# Patient Record
Sex: Male | Born: 1960
Health system: Southern US, Community
[De-identification: ages and names within clinical notes are randomized; demographics above are authoritative.]

## PROBLEM LIST (undated history)

## (undated) DIAGNOSIS — I69354 Hemiplegia and hemiparesis following cerebral infarction affecting left non-dominant side: Secondary | ICD-10-CM

## (undated) DIAGNOSIS — G936 Cerebral edema: Secondary | ICD-10-CM

## (undated) DIAGNOSIS — I69991 Dysphagia following unspecified cerebrovascular disease: Secondary | ICD-10-CM

## (undated) DIAGNOSIS — I639 Cerebral infarction, unspecified: Secondary | ICD-10-CM

## (undated) DIAGNOSIS — W19XXXA Unspecified fall, initial encounter: Secondary | ICD-10-CM

## (undated) DIAGNOSIS — E119 Type 2 diabetes mellitus without complications: Secondary | ICD-10-CM

## (undated) DIAGNOSIS — I10 Essential (primary) hypertension: Secondary | ICD-10-CM

## (undated) DIAGNOSIS — Y92009 Unspecified place in unspecified non-institutional (private) residence as the place of occurrence of the external cause: Secondary | ICD-10-CM

## (undated) DIAGNOSIS — A159 Respiratory tuberculosis unspecified: Secondary | ICD-10-CM

## (undated) DIAGNOSIS — J9600 Acute respiratory failure, unspecified whether with hypoxia or hypercapnia: Secondary | ICD-10-CM

## (undated) DIAGNOSIS — E785 Hyperlipidemia, unspecified: Secondary | ICD-10-CM

## (undated) HISTORY — PX: SKIN GRAFT: SHX250

## (undated) HISTORY — DX: Cerebral edema: G93.6

## (undated) HISTORY — DX: Unspecified place in unspecified non-institutional (private) residence as the place of occurrence of the external cause: W19.XXXA

## (undated) HISTORY — PX: UPPER GASTROINTESTINAL ENDOSCOPY: SHX188

## (undated) HISTORY — DX: Respiratory tuberculosis unspecified: A15.9

## (undated) HISTORY — DX: Type 2 diabetes mellitus without complications: E11.9

## (undated) HISTORY — DX: Hemiplegia and hemiparesis following cerebral infarction affecting left non-dominant side: I69.354

## (undated) HISTORY — DX: Acute respiratory failure, unspecified whether with hypoxia or hypercapnia: J96.00

## (undated) HISTORY — DX: Dysphagia following unspecified cerebrovascular disease: I69.991

## (undated) HISTORY — DX: Cerebral infarction, unspecified: I63.9

## (undated) HISTORY — DX: Essential (primary) hypertension: I10

## (undated) HISTORY — PX: OTHER SURGICAL HISTORY: SHX169

## (undated) HISTORY — DX: Unspecified place in unspecified non-institutional (private) residence as the place of occurrence of the external cause: Y92.009

## (undated) HISTORY — DX: Hyperlipidemia, unspecified: E78.5

---

## 2014-11-08 DIAGNOSIS — I639 Cerebral infarction, unspecified: Secondary | ICD-10-CM

## 2014-11-08 HISTORY — DX: Cerebral infarction, unspecified: I63.9

## 2015-05-08 DIAGNOSIS — R1312 Dysphagia, oropharyngeal phase: Secondary | ICD-10-CM | POA: Diagnosis present

## 2015-05-08 DIAGNOSIS — I668 Occlusion and stenosis of other cerebral arteries: Secondary | ICD-10-CM | POA: Diagnosis not present

## 2015-05-08 DIAGNOSIS — E781 Pure hyperglyceridemia: Secondary | ICD-10-CM | POA: Diagnosis present

## 2015-05-08 DIAGNOSIS — F17210 Nicotine dependence, cigarettes, uncomplicated: Secondary | ICD-10-CM | POA: Diagnosis present

## 2015-05-08 DIAGNOSIS — I69154 Hemiplegia and hemiparesis following nontraumatic intracerebral hemorrhage affecting left non-dominant side: Secondary | ICD-10-CM | POA: Diagnosis not present

## 2015-05-08 DIAGNOSIS — I340 Nonrheumatic mitral (valve) insufficiency: Secondary | ICD-10-CM | POA: Diagnosis not present

## 2015-05-08 DIAGNOSIS — R471 Dysarthria and anarthria: Secondary | ICD-10-CM | POA: Diagnosis present

## 2015-05-08 DIAGNOSIS — J690 Pneumonitis due to inhalation of food and vomit: Secondary | ICD-10-CM | POA: Diagnosis not present

## 2015-05-08 DIAGNOSIS — R5383 Other fatigue: Secondary | ICD-10-CM | POA: Diagnosis not present

## 2015-05-08 DIAGNOSIS — I639 Cerebral infarction, unspecified: Secondary | ICD-10-CM | POA: Diagnosis not present

## 2015-05-08 DIAGNOSIS — R279 Unspecified lack of coordination: Secondary | ICD-10-CM | POA: Diagnosis not present

## 2015-05-08 DIAGNOSIS — R9431 Abnormal electrocardiogram [ECG] [EKG]: Secondary | ICD-10-CM | POA: Insufficient documentation

## 2015-05-08 DIAGNOSIS — R414 Neurologic neglect syndrome: Secondary | ICD-10-CM | POA: Diagnosis present

## 2015-05-08 DIAGNOSIS — I10 Essential (primary) hypertension: Secondary | ICD-10-CM | POA: Diagnosis not present

## 2015-05-08 DIAGNOSIS — G8194 Hemiplegia, unspecified affecting left nondominant side: Secondary | ICD-10-CM | POA: Diagnosis not present

## 2015-05-08 DIAGNOSIS — E782 Mixed hyperlipidemia: Secondary | ICD-10-CM | POA: Diagnosis not present

## 2015-05-08 DIAGNOSIS — R633 Feeding difficulties: Secondary | ICD-10-CM | POA: Diagnosis not present

## 2015-05-08 DIAGNOSIS — R4189 Other symptoms and signs involving cognitive functions and awareness: Secondary | ICD-10-CM | POA: Diagnosis present

## 2015-05-08 DIAGNOSIS — G936 Cerebral edema: Secondary | ICD-10-CM | POA: Diagnosis present

## 2015-05-08 DIAGNOSIS — J811 Chronic pulmonary edema: Secondary | ICD-10-CM | POA: Diagnosis not present

## 2015-05-08 DIAGNOSIS — I63411 Cerebral infarction due to embolism of right middle cerebral artery: Secondary | ICD-10-CM | POA: Diagnosis present

## 2015-05-08 DIAGNOSIS — N179 Acute kidney failure, unspecified: Secondary | ICD-10-CM | POA: Diagnosis not present

## 2015-05-08 DIAGNOSIS — H53462 Homonymous bilateral field defects, left side: Secondary | ICD-10-CM | POA: Diagnosis present

## 2015-05-08 DIAGNOSIS — I361 Nonrheumatic tricuspid (valve) insufficiency: Secondary | ICD-10-CM | POA: Diagnosis not present

## 2015-05-08 DIAGNOSIS — J9811 Atelectasis: Secondary | ICD-10-CM | POA: Diagnosis not present

## 2015-05-08 DIAGNOSIS — R29810 Facial weakness: Secondary | ICD-10-CM | POA: Diagnosis not present

## 2015-05-08 DIAGNOSIS — F17290 Nicotine dependence, other tobacco product, uncomplicated: Secondary | ICD-10-CM | POA: Diagnosis not present

## 2015-05-08 DIAGNOSIS — D696 Thrombocytopenia, unspecified: Secondary | ICD-10-CM | POA: Diagnosis present

## 2015-05-08 DIAGNOSIS — I63311 Cerebral infarction due to thrombosis of right middle cerebral artery: Secondary | ICD-10-CM | POA: Diagnosis not present

## 2015-05-08 DIAGNOSIS — J9600 Acute respiratory failure, unspecified whether with hypoxia or hypercapnia: Secondary | ICD-10-CM | POA: Diagnosis not present

## 2015-05-08 DIAGNOSIS — Z9889 Other specified postprocedural states: Secondary | ICD-10-CM | POA: Diagnosis not present

## 2015-05-08 DIAGNOSIS — R4781 Slurred speech: Secondary | ICD-10-CM | POA: Diagnosis present

## 2015-05-08 DIAGNOSIS — G510 Bell's palsy: Secondary | ICD-10-CM | POA: Diagnosis present

## 2015-05-08 DIAGNOSIS — J168 Pneumonia due to other specified infectious organisms: Secondary | ICD-10-CM | POA: Diagnosis not present

## 2015-05-08 DIAGNOSIS — E780 Pure hypercholesterolemia: Secondary | ICD-10-CM | POA: Diagnosis present

## 2015-05-08 DIAGNOSIS — I629 Nontraumatic intracranial hemorrhage, unspecified: Secondary | ICD-10-CM | POA: Diagnosis not present

## 2015-05-08 DIAGNOSIS — I63511 Cerebral infarction due to unspecified occlusion or stenosis of right middle cerebral artery: Secondary | ICD-10-CM | POA: Diagnosis not present

## 2015-05-08 DIAGNOSIS — J90 Pleural effusion, not elsewhere classified: Secondary | ICD-10-CM | POA: Diagnosis not present

## 2015-05-08 DIAGNOSIS — M7989 Other specified soft tissue disorders: Secondary | ICD-10-CM | POA: Diagnosis present

## 2015-05-08 DIAGNOSIS — S066X9A Traumatic subarachnoid hemorrhage with loss of consciousness of unspecified duration, initial encounter: Secondary | ICD-10-CM | POA: Diagnosis not present

## 2015-05-08 DIAGNOSIS — F339 Major depressive disorder, recurrent, unspecified: Secondary | ICD-10-CM | POA: Diagnosis not present

## 2015-05-08 DIAGNOSIS — J9601 Acute respiratory failure with hypoxia: Secondary | ICD-10-CM | POA: Diagnosis not present

## 2015-05-08 DIAGNOSIS — M6281 Muscle weakness (generalized): Secondary | ICD-10-CM | POA: Diagnosis not present

## 2015-05-08 DIAGNOSIS — E871 Hypo-osmolality and hyponatremia: Secondary | ICD-10-CM | POA: Diagnosis not present

## 2015-05-08 DIAGNOSIS — I6601 Occlusion and stenosis of right middle cerebral artery: Secondary | ICD-10-CM | POA: Diagnosis not present

## 2015-05-08 DIAGNOSIS — R319 Hematuria, unspecified: Secondary | ICD-10-CM | POA: Diagnosis not present

## 2015-05-08 DIAGNOSIS — I6339 Cerebral infarction due to thrombosis of other cerebral artery: Secondary | ICD-10-CM | POA: Diagnosis not present

## 2015-05-08 DIAGNOSIS — I4581 Long QT syndrome: Secondary | ICD-10-CM | POA: Diagnosis present

## 2015-05-08 DIAGNOSIS — E785 Hyperlipidemia, unspecified: Secondary | ICD-10-CM | POA: Diagnosis present

## 2015-05-08 DIAGNOSIS — Z4682 Encounter for fitting and adjustment of non-vascular catheter: Secondary | ICD-10-CM | POA: Diagnosis not present

## 2015-05-08 DIAGNOSIS — Z8673 Personal history of transient ischemic attack (TIA), and cerebral infarction without residual deficits: Secondary | ICD-10-CM | POA: Insufficient documentation

## 2015-05-08 DIAGNOSIS — R1310 Dysphagia, unspecified: Secondary | ICD-10-CM | POA: Diagnosis not present

## 2015-05-08 DIAGNOSIS — I63319 Cerebral infarction due to thrombosis of unspecified middle cerebral artery: Secondary | ICD-10-CM | POA: Diagnosis not present

## 2015-05-08 DIAGNOSIS — E119 Type 2 diabetes mellitus without complications: Secondary | ICD-10-CM | POA: Diagnosis present

## 2015-05-08 DIAGNOSIS — G8104 Flaccid hemiplegia affecting left nondominant side: Secondary | ICD-10-CM | POA: Diagnosis present

## 2015-05-14 DIAGNOSIS — E782 Mixed hyperlipidemia: Secondary | ICD-10-CM | POA: Insufficient documentation

## 2015-05-19 HISTORY — PX: GASTROSTOMY TUBE PLACEMENT: SHX655

## 2015-05-30 DIAGNOSIS — J690 Pneumonitis due to inhalation of food and vomit: Secondary | ICD-10-CM | POA: Diagnosis not present

## 2015-05-30 DIAGNOSIS — M6281 Muscle weakness (generalized): Secondary | ICD-10-CM | POA: Diagnosis not present

## 2015-05-30 DIAGNOSIS — J90 Pleural effusion, not elsewhere classified: Secondary | ICD-10-CM | POA: Diagnosis present

## 2015-05-30 DIAGNOSIS — E782 Mixed hyperlipidemia: Secondary | ICD-10-CM | POA: Diagnosis not present

## 2015-05-30 DIAGNOSIS — I63411 Cerebral infarction due to embolism of right middle cerebral artery: Secondary | ICD-10-CM | POA: Diagnosis not present

## 2015-05-30 DIAGNOSIS — R1310 Dysphagia, unspecified: Secondary | ICD-10-CM | POA: Diagnosis not present

## 2015-05-30 DIAGNOSIS — G8194 Hemiplegia, unspecified affecting left nondominant side: Secondary | ICD-10-CM | POA: Diagnosis present

## 2015-05-30 DIAGNOSIS — I4581 Long QT syndrome: Secondary | ICD-10-CM | POA: Diagnosis not present

## 2015-05-30 DIAGNOSIS — R1011 Right upper quadrant pain: Secondary | ICD-10-CM | POA: Diagnosis not present

## 2015-05-30 DIAGNOSIS — Z720 Tobacco use: Secondary | ICD-10-CM | POA: Diagnosis not present

## 2015-05-30 DIAGNOSIS — I69354 Hemiplegia and hemiparesis following cerebral infarction affecting left non-dominant side: Secondary | ICD-10-CM | POA: Diagnosis not present

## 2015-05-30 DIAGNOSIS — Z789 Other specified health status: Secondary | ICD-10-CM | POA: Diagnosis not present

## 2015-05-30 DIAGNOSIS — I69398 Other sequelae of cerebral infarction: Secondary | ICD-10-CM | POA: Diagnosis not present

## 2015-05-30 DIAGNOSIS — R52 Pain, unspecified: Secondary | ICD-10-CM | POA: Diagnosis not present

## 2015-05-30 DIAGNOSIS — R05 Cough: Secondary | ICD-10-CM | POA: Diagnosis not present

## 2015-05-30 DIAGNOSIS — I639 Cerebral infarction, unspecified: Secondary | ICD-10-CM | POA: Diagnosis not present

## 2015-05-30 DIAGNOSIS — R1012 Left upper quadrant pain: Secondary | ICD-10-CM | POA: Diagnosis not present

## 2015-05-30 DIAGNOSIS — R0781 Pleurodynia: Secondary | ICD-10-CM | POA: Diagnosis present

## 2015-05-30 DIAGNOSIS — I69959 Hemiplegia and hemiparesis following unspecified cerebrovascular disease affecting unspecified side: Secondary | ICD-10-CM | POA: Diagnosis not present

## 2015-05-30 DIAGNOSIS — R279 Unspecified lack of coordination: Secondary | ICD-10-CM | POA: Diagnosis not present

## 2015-05-30 DIAGNOSIS — T17908A Unspecified foreign body in respiratory tract, part unspecified causing other injury, initial encounter: Secondary | ICD-10-CM | POA: Diagnosis not present

## 2015-05-30 DIAGNOSIS — F17290 Nicotine dependence, other tobacco product, uncomplicated: Secondary | ICD-10-CM | POA: Diagnosis not present

## 2015-05-30 DIAGNOSIS — I69154 Hemiplegia and hemiparesis following nontraumatic intracerebral hemorrhage affecting left non-dominant side: Secondary | ICD-10-CM | POA: Diagnosis not present

## 2015-05-30 DIAGNOSIS — E785 Hyperlipidemia, unspecified: Secondary | ICD-10-CM | POA: Diagnosis not present

## 2015-05-30 DIAGNOSIS — R918 Other nonspecific abnormal finding of lung field: Secondary | ICD-10-CM | POA: Diagnosis not present

## 2015-05-30 DIAGNOSIS — I10 Essential (primary) hypertension: Secondary | ICD-10-CM | POA: Diagnosis not present

## 2015-05-30 DIAGNOSIS — I6339 Cerebral infarction due to thrombosis of other cerebral artery: Secondary | ICD-10-CM | POA: Diagnosis not present

## 2015-05-30 DIAGNOSIS — F339 Major depressive disorder, recurrent, unspecified: Secondary | ICD-10-CM | POA: Diagnosis not present

## 2015-05-30 DIAGNOSIS — R079 Chest pain, unspecified: Secondary | ICD-10-CM | POA: Diagnosis not present

## 2015-05-30 DIAGNOSIS — E1140 Type 2 diabetes mellitus with diabetic neuropathy, unspecified: Secondary | ICD-10-CM | POA: Diagnosis not present

## 2015-05-30 DIAGNOSIS — R0602 Shortness of breath: Secondary | ICD-10-CM | POA: Diagnosis not present

## 2015-05-30 DIAGNOSIS — Z87891 Personal history of nicotine dependence: Secondary | ICD-10-CM | POA: Diagnosis not present

## 2015-05-30 DIAGNOSIS — Z931 Gastrostomy status: Secondary | ICD-10-CM | POA: Diagnosis not present

## 2015-05-30 DIAGNOSIS — I69321 Dysphasia following cerebral infarction: Secondary | ICD-10-CM | POA: Diagnosis not present

## 2015-05-30 DIAGNOSIS — R110 Nausea: Secondary | ICD-10-CM | POA: Diagnosis not present

## 2015-05-30 DIAGNOSIS — I69392 Facial weakness following cerebral infarction: Secondary | ICD-10-CM | POA: Diagnosis not present

## 2015-05-30 DIAGNOSIS — D649 Anemia, unspecified: Secondary | ICD-10-CM | POA: Diagnosis present

## 2015-05-30 DIAGNOSIS — I6350 Cerebral infarction due to unspecified occlusion or stenosis of unspecified cerebral artery: Secondary | ICD-10-CM | POA: Diagnosis not present

## 2015-05-30 DIAGNOSIS — R319 Hematuria, unspecified: Secondary | ICD-10-CM | POA: Diagnosis present

## 2015-05-30 DIAGNOSIS — J189 Pneumonia, unspecified organism: Secondary | ICD-10-CM | POA: Diagnosis not present

## 2015-05-30 DIAGNOSIS — H539 Unspecified visual disturbance: Secondary | ICD-10-CM | POA: Diagnosis present

## 2015-05-30 DIAGNOSIS — N179 Acute kidney failure, unspecified: Secondary | ICD-10-CM | POA: Diagnosis not present

## 2015-05-30 DIAGNOSIS — F329 Major depressive disorder, single episode, unspecified: Secondary | ICD-10-CM | POA: Diagnosis not present

## 2015-05-30 DIAGNOSIS — E119 Type 2 diabetes mellitus without complications: Secondary | ICD-10-CM | POA: Diagnosis not present

## 2015-05-30 DIAGNOSIS — R109 Unspecified abdominal pain: Secondary | ICD-10-CM | POA: Diagnosis not present

## 2015-05-30 DIAGNOSIS — I69391 Dysphagia following cerebral infarction: Secondary | ICD-10-CM | POA: Diagnosis not present

## 2015-05-30 DIAGNOSIS — K5900 Constipation, unspecified: Secondary | ICD-10-CM | POA: Diagnosis not present

## 2015-05-30 DIAGNOSIS — I69854 Hemiplegia and hemiparesis following other cerebrovascular disease affecting left non-dominant side: Secondary | ICD-10-CM | POA: Diagnosis not present

## 2015-05-30 DIAGNOSIS — J168 Pneumonia due to other specified infectious organisms: Secondary | ICD-10-CM | POA: Diagnosis not present

## 2015-05-30 DIAGNOSIS — Z8673 Personal history of transient ischemic attack (TIA), and cerebral infarction without residual deficits: Secondary | ICD-10-CM | POA: Diagnosis not present

## 2015-06-02 ENCOUNTER — Encounter: Payer: Self-pay | Admitting: Adult Health

## 2015-06-02 ENCOUNTER — Non-Acute Institutional Stay (SKILLED_NURSING_FACILITY): Payer: Medicare Other | Admitting: Adult Health

## 2015-06-02 DIAGNOSIS — I69354 Hemiplegia and hemiparesis following cerebral infarction affecting left non-dominant side: Secondary | ICD-10-CM | POA: Insufficient documentation

## 2015-06-02 DIAGNOSIS — K5909 Other constipation: Secondary | ICD-10-CM | POA: Insufficient documentation

## 2015-06-02 DIAGNOSIS — I69854 Hemiplegia and hemiparesis following other cerebrovascular disease affecting left non-dominant side: Secondary | ICD-10-CM

## 2015-06-02 DIAGNOSIS — F329 Major depressive disorder, single episode, unspecified: Secondary | ICD-10-CM | POA: Diagnosis not present

## 2015-06-02 DIAGNOSIS — E785 Hyperlipidemia, unspecified: Secondary | ICD-10-CM | POA: Diagnosis not present

## 2015-06-02 DIAGNOSIS — I6350 Cerebral infarction due to unspecified occlusion or stenosis of unspecified cerebral artery: Secondary | ICD-10-CM

## 2015-06-02 DIAGNOSIS — I10 Essential (primary) hypertension: Secondary | ICD-10-CM | POA: Insufficient documentation

## 2015-06-02 DIAGNOSIS — K5900 Constipation, unspecified: Secondary | ICD-10-CM | POA: Diagnosis not present

## 2015-06-02 DIAGNOSIS — E1149 Type 2 diabetes mellitus with other diabetic neurological complication: Secondary | ICD-10-CM | POA: Insufficient documentation

## 2015-06-02 DIAGNOSIS — I69391 Dysphagia following cerebral infarction: Secondary | ICD-10-CM | POA: Insufficient documentation

## 2015-06-02 DIAGNOSIS — E1140 Type 2 diabetes mellitus with diabetic neuropathy, unspecified: Secondary | ICD-10-CM

## 2015-06-02 DIAGNOSIS — I639 Cerebral infarction, unspecified: Secondary | ICD-10-CM

## 2015-06-02 DIAGNOSIS — IMO0002 Reserved for concepts with insufficient information to code with codable children: Secondary | ICD-10-CM | POA: Insufficient documentation

## 2015-06-02 NOTE — Progress Notes (Signed)
Patient ID: [name] [name], male   DOB: 1961/10/27, 54 y.o.   MRN: [id number]    Facility: [facility]      No Known Allergies  Chief Complaint  Patient presents with  . Hospitalization Follow-up    HPI:  He has been hospitalized for his second cva. He has left hemiparesis with very gross movement present on the left side. He is peg tube dependent at this time for his nutritional support. He is here for short term rehab with his goal to return back home. He is not voicing any concerns at this time.    Past Medical History  Diagnosis Date  . Stroke   . Hypertension   . Hyperlipidemia   . Diabetes mellitus without complication   . Fall at home   . Hemiparesis affecting left side as late effect of stroke   . Dysphagia as late effect of cerebrovascular disease   . Acute respiratory failure   . Cerebral edema     Past Surgical History  Procedure Laterality Date  . Gastrostomy tube placement  05-19-15  . Mechanical thrombectomy angioplasty stenet placement      VITAL SIGNS BP 142/79 mmHg  Pulse 86  Ht 6' (1.829 m)  Wt 259 lb (117.482 kg)  BMI 35.12 kg/m2  Patient's Medications  New Prescriptions   No medications on file  Previous Medications   ACETAMINOPHEN (TYLENOL) 325 MG TABLET    Take 325-650 mg by mouth every 6 (six) hours as needed.   AMANTADINE (SYMMETREL) 100 MG CAPSULE    Take 200 mg by mouth 2 (two) times daily.   AMLODIPINE (NORVASC) 10 MG TABLET    Take 10 mg by mouth daily.   ASPIRIN 81 MG TABLET    Take 81 mg by mouth daily.   ATORVASTATIN (LIPITOR) 80 MG TABLET    Take 80 mg by mouth daily at 6 PM.   CLOPIDOGREL (PLAVIX) 75 MG TABLET    Take 75 mg by mouth daily.   FLUOXETINE (PROZAC) 20 MG CAPSULE    Take 20 mg by mouth daily.   HYDROCHLOROTHIAZIDE (HYDRODIURIL) 25 MG TABLET    Take 25 mg by mouth daily.   LISINOPRIL (PRINIVIL,ZESTRIL) 40 MG TABLET    Take 40 mg by mouth daily.   MINOXIDIL (LONITEN) 2.5 MG TABLET    Take 5 mg by mouth  daily.   MULTIPLE VITAMINS TABLET    Take 1 tablet by mouth daily.   SENNOSIDES-DOCUSATE SODIUM (SENOKOT-S) 8.6-50 MG TABLET    Take 2 tablets by mouth 2 (two) times daily.  Modified Medications   No medications on file  Discontinued Medications   No medications on file     SIGNIFICANT DIAGNOSTIC EXAMS  04-28-15: ct of head: 1. Right medial parietal hematoma, stable. 2. 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 No new hemorrhage   6-300-16: ct of head: 1. Findings consistent with evolution of patchy regions of right MCA infarction, status post thrombectomy and stenting of M1. 2. Negative for hemorrhage  05-08-15: MRI of brain: 1. Findings consistent with large region of acute ischemia int he right MCA distrubution. On evidence of hemorrhage. 2. Interval progression of chronic small vessel ischemic changes.   05-08-15: 2-d echo: mild LV dysfunction EF 50%; with moderate LVH; normal LA pressures with diastolic dysfunction. Normal right ventricular systolic function. Valvular regurgitation: trivial MR; trivial TR; no valvular stenosis.    05-09-15: EKG: sinus bradycardia   05-09-15: chest x-ry: 1. Stable enlarged  cardiac and mediastinal contours. 2. Small left pleural effusion with underlying atelectasis   05-09-15: diagnostic cerebral angiogram: 1. Right common femoral angiogram demonstrates satisfactory placement of sheath within the right common femoral artery, above the common femoral bifurcation and below  the origin of the inferior epigraastric artery with adequate vascular caliber for use of an arterial closure device. Right common carotid angiography demonstrates stable positioning and configuration of right mca enterprise stent device with preserved stent patency an antegrade opacification of distal right mca territory. Stable positioning and configuration of trigt MCA M1 segment enterprise stent device with preserved stent patency and antegrade opacification of right MCA  territory   05-11-15: left upper extremity venous ultrasound: negative for dvt  05-28-15: ct of head; 1. Right medial parietal hematoma , stable. 2. findings of right MCA territory laminar necrosis unchanged. No new hemorrhage   LABS REVIEWED:   05-08-15: hgb a1c 5.9; chol 253; trig 424; hdl 47  05-26-15: glucose 126; bun 20; creat 1.0; k+4.0; na++ 134 05-28-15: wbc 5.4; hgb 12.0; hct 34.6; mcv 86; plt 233; glucose 106; bun 26; creat 1.6; k+3.8; na++ 138 05-29-15: wbc 5.7; hgb 12.6; hct 36.6; mcv 86; plt 215;glcuose 156; bun 18; creat 1.1; k+3.8; na++ 135        Review of Systems  Constitutional: Negative for appetite change and fatigue.  HENT: Negative for congestion.   Respiratory: Negative for cough, chest tightness and shortness of breath.   Cardiovascular: Negative for chest pain, palpitations and leg swelling.  Gastrointestinal: Negative for nausea, abdominal pain, diarrhea and constipation.  Musculoskeletal: Negative for myalgias and arthralgias.       He has left side weakness present   Skin: Negative for pallor.  Neurological: Negative for dizziness.  Psychiatric/Behavioral: The patient is not nervous/anxious.       Physical Exam  Vitals reviewed. Constitutional: No distress.  Obese   Eyes: Conjunctivae are normal.  Neck: Neck supple. No JVD present. No thyromegaly present.  Cardiovascular: Normal rate, regular rhythm and intact distal pulses.   Respiratory: Effort normal and breath sounds normal. No respiratory distress. He has no wheezes.  GI: Soft. Bowel sounds are normal. He exhibits no distension. There is no tenderness.  Has peg tube present   Musculoskeletal: He exhibits no edema.  Has left hemiparesis with gross movement present  Is able to move right side extremities.    Lymphadenopathy:    He has no cervical adenopathy.  Neurological: He is alert.  Skin: Skin is warm and dry. He is not diaphoretic.  Psychiatric: He has a normal mood and affect.        ASSESSMENT/ PLAN:  1.  Acute cva with left hemiparesis: is neurologically stable; will continue therapy as directed to improve upon mobility strength and independence with adl's will continue asa 81 mg daily; and plavix 75 mg daily will continue amantadine 200 mg twice daily for abnormal movements and will monitor his status.   2. Hypertension: will continue norvasc 10 mg daily; hctz 25 mg daily; lisinopril 40 mg daily minoxidil 5 mg daily and will have nursing check blood pressure three times daily   3. Dyslipidemia: will continue lipitor 80 mg daily and will begin fenofibrate 40 mg daily trig 424 and total cholesterol 253  4. Diabetes with neurological effects: his hgb a1c is 5.9; diet controlled; will have nursing check blood pressure twice daily and will monitor  5. Constipation: will continue senna s 2 tabs twice daily   6. Depression: will continue prozac  20 mg daily will monitor his status.   7. Dysphagia: no signs of aspiration present; is peg tube dependent at this time for his nutritional status. Does receive a tray at this time with  Tube feeding at night and bolus for low meals intake. Will continue speech therapy as directed and will monitor his status.     Time spent with patient  60  minutes >50% time spent counseling; reviewing medical record; tests; labs; and developing future plan of care   Ok Edwards NP [facility]  Contact [phone] Monday through Friday 8am- 5pm  After hours call [phone]

## 2015-06-03 ENCOUNTER — Non-Acute Institutional Stay (SKILLED_NURSING_FACILITY): Payer: Medicare Other | Admitting: Internal Medicine

## 2015-06-03 DIAGNOSIS — F329 Major depressive disorder, single episode, unspecified: Secondary | ICD-10-CM | POA: Diagnosis not present

## 2015-06-03 DIAGNOSIS — Z720 Tobacco use: Secondary | ICD-10-CM

## 2015-06-03 DIAGNOSIS — E1149 Type 2 diabetes mellitus with other diabetic neurological complication: Secondary | ICD-10-CM

## 2015-06-03 DIAGNOSIS — I69391 Dysphagia following cerebral infarction: Secondary | ICD-10-CM

## 2015-06-03 DIAGNOSIS — I4581 Long QT syndrome: Secondary | ICD-10-CM | POA: Diagnosis not present

## 2015-06-03 DIAGNOSIS — I69959 Hemiplegia and hemiparesis following unspecified cerebrovascular disease affecting unspecified side: Secondary | ICD-10-CM | POA: Diagnosis not present

## 2015-06-03 DIAGNOSIS — I10 Essential (primary) hypertension: Secondary | ICD-10-CM | POA: Diagnosis not present

## 2015-06-03 DIAGNOSIS — I6350 Cerebral infarction due to unspecified occlusion or stenosis of unspecified cerebral artery: Secondary | ICD-10-CM | POA: Diagnosis not present

## 2015-06-03 DIAGNOSIS — E785 Hyperlipidemia, unspecified: Secondary | ICD-10-CM | POA: Diagnosis not present

## 2015-06-03 DIAGNOSIS — Z931 Gastrostomy status: Secondary | ICD-10-CM

## 2015-06-03 DIAGNOSIS — R9431 Abnormal electrocardiogram [ECG] [EKG]: Secondary | ICD-10-CM

## 2015-06-03 DIAGNOSIS — E1140 Type 2 diabetes mellitus with diabetic neuropathy, unspecified: Secondary | ICD-10-CM | POA: Diagnosis not present

## 2015-06-03 DIAGNOSIS — I69359 Hemiplegia and hemiparesis following cerebral infarction affecting unspecified side: Secondary | ICD-10-CM

## 2015-06-03 DIAGNOSIS — IMO0002 Reserved for concepts with insufficient information to code with codable children: Secondary | ICD-10-CM

## 2015-07-14 ENCOUNTER — Non-Acute Institutional Stay (SKILLED_NURSING_FACILITY): Payer: Medicare Other | Admitting: Adult Health

## 2015-07-14 DIAGNOSIS — F329 Major depressive disorder, single episode, unspecified: Secondary | ICD-10-CM | POA: Diagnosis not present

## 2015-07-14 DIAGNOSIS — E1140 Type 2 diabetes mellitus with diabetic neuropathy, unspecified: Secondary | ICD-10-CM | POA: Diagnosis not present

## 2015-07-14 DIAGNOSIS — K5900 Constipation, unspecified: Secondary | ICD-10-CM | POA: Diagnosis not present

## 2015-07-14 DIAGNOSIS — I6350 Cerebral infarction due to unspecified occlusion or stenosis of unspecified cerebral artery: Secondary | ICD-10-CM | POA: Diagnosis not present

## 2015-07-14 DIAGNOSIS — I639 Cerebral infarction, unspecified: Secondary | ICD-10-CM | POA: Diagnosis not present

## 2015-07-14 DIAGNOSIS — I10 Essential (primary) hypertension: Secondary | ICD-10-CM | POA: Diagnosis not present

## 2015-07-14 DIAGNOSIS — IMO0002 Reserved for concepts with insufficient information to code with codable children: Secondary | ICD-10-CM

## 2015-07-14 DIAGNOSIS — I69391 Dysphagia following cerebral infarction: Secondary | ICD-10-CM

## 2015-07-14 DIAGNOSIS — E1149 Type 2 diabetes mellitus with other diabetic neurological complication: Secondary | ICD-10-CM

## 2015-07-14 DIAGNOSIS — I69854 Hemiplegia and hemiparesis following other cerebrovascular disease affecting left non-dominant side: Secondary | ICD-10-CM

## 2015-07-14 DIAGNOSIS — E785 Hyperlipidemia, unspecified: Secondary | ICD-10-CM | POA: Diagnosis not present

## 2015-07-14 DIAGNOSIS — K5909 Other constipation: Secondary | ICD-10-CM

## 2015-07-14 DIAGNOSIS — I69354 Hemiplegia and hemiparesis following cerebral infarction affecting left non-dominant side: Secondary | ICD-10-CM

## 2015-07-23 ENCOUNTER — Other Ambulatory Visit: Payer: Self-pay | Admitting: *Deleted

## 2015-07-23 ENCOUNTER — Emergency Department (HOSPITAL_COMMUNITY): Payer: Self-pay

## 2015-07-23 ENCOUNTER — Emergency Department (HOSPITAL_COMMUNITY)
Admission: EM | Admit: 2015-07-23 | Discharge: 2015-07-24 | Disposition: A | Discharge status: Home / Self Care | Payer: Self-pay | Attending: Emergency Medicine | Admitting: Emergency Medicine

## 2015-07-23 ENCOUNTER — Encounter (HOSPITAL_COMMUNITY): Payer: Self-pay | Admitting: Emergency Medicine

## 2015-07-23 ENCOUNTER — Other Ambulatory Visit (HOSPITAL_COMMUNITY): Payer: Self-pay | Admitting: Internal Medicine

## 2015-07-23 DIAGNOSIS — E785 Hyperlipidemia, unspecified: Secondary | ICD-10-CM | POA: Insufficient documentation

## 2015-07-23 DIAGNOSIS — Z720 Tobacco use: Secondary | ICD-10-CM | POA: Insufficient documentation

## 2015-07-23 DIAGNOSIS — Z8673 Personal history of transient ischemic attack (TIA), and cerebral infarction without residual deficits: Secondary | ICD-10-CM | POA: Insufficient documentation

## 2015-07-23 DIAGNOSIS — Z7902 Long term (current) use of antithrombotics/antiplatelets: Secondary | ICD-10-CM | POA: Insufficient documentation

## 2015-07-23 DIAGNOSIS — I10 Essential (primary) hypertension: Secondary | ICD-10-CM | POA: Insufficient documentation

## 2015-07-23 DIAGNOSIS — J181 Lobar pneumonia, unspecified organism: Secondary | ICD-10-CM

## 2015-07-23 DIAGNOSIS — J690 Pneumonitis due to inhalation of food and vomit: Secondary | ICD-10-CM

## 2015-07-23 DIAGNOSIS — J189 Pneumonia, unspecified organism: Secondary | ICD-10-CM

## 2015-07-23 DIAGNOSIS — Z79899 Other long term (current) drug therapy: Secondary | ICD-10-CM | POA: Insufficient documentation

## 2015-07-23 DIAGNOSIS — E119 Type 2 diabetes mellitus without complications: Secondary | ICD-10-CM | POA: Insufficient documentation

## 2015-07-23 DIAGNOSIS — J159 Unspecified bacterial pneumonia: Secondary | ICD-10-CM | POA: Insufficient documentation

## 2015-07-23 DIAGNOSIS — R1110 Vomiting, unspecified: Secondary | ICD-10-CM | POA: Insufficient documentation

## 2015-07-23 DIAGNOSIS — Z7982 Long term (current) use of aspirin: Secondary | ICD-10-CM | POA: Insufficient documentation

## 2015-07-23 LAB — CBC WITH DIFFERENTIAL/PLATELET
BASOS ABS: 0 10*3/uL (ref 0.0–0.1)
Basophils Relative: 0 %
Eosinophils Absolute: 0.1 10*3/uL (ref 0.0–0.7)
Eosinophils Relative: 1 %
HEMATOCRIT: 29.8 % — AB (ref 39.0–52.0)
HEMOGLOBIN: 10.2 g/dL — AB (ref 13.0–17.0)
LYMPHS PCT: 9 %
Lymphs Abs: 1 10*3/uL (ref 0.7–4.0)
MCH: 29.1 pg (ref 26.0–34.0)
MCHC: 34.2 g/dL (ref 30.0–36.0)
MCV: 84.9 fL (ref 78.0–100.0)
MONO ABS: 1.2 10*3/uL — AB (ref 0.1–1.0)
Monocytes Relative: 11 %
Neutro Abs: 8.5 10*3/uL — ABNORMAL HIGH (ref 1.7–7.7)
Neutrophils Relative %: 79 %
Platelets: 141 10*3/uL — ABNORMAL LOW (ref 150–400)
RBC: 3.51 MIL/uL — AB (ref 4.22–5.81)
RDW: 13.9 % (ref 11.5–15.5)
WBC: 10.7 10*3/uL — AB (ref 4.0–10.5)

## 2015-07-23 LAB — BASIC METABOLIC PANEL
ANION GAP: 14 (ref 5–15)
BUN: 42 mg/dL — ABNORMAL HIGH (ref 6–20)
CO2: 19 mmol/L — AB (ref 22–32)
Calcium: 9.3 mg/dL (ref 8.9–10.3)
Chloride: 104 mmol/L (ref 101–111)
Creatinine, Ser: 3.11 mg/dL — ABNORMAL HIGH (ref 0.61–1.24)
GFR calc non Af Amer: 21 mL/min — ABNORMAL LOW (ref >60)
GFR, EST AFRICAN AMERICAN: 24 mL/min — AB (ref >60)
GLUCOSE: 130 mg/dL — AB (ref 65–99)
POTASSIUM: 3.7 mmol/L (ref 3.5–5.1)
Sodium: 137 mmol/L (ref 135–145)

## 2015-07-23 LAB — I-STAT VENOUS BLOOD GAS, ED
ACID-BASE DEFICIT: 4 mmol/L — AB (ref 0.0–2.0)
BICARBONATE: 19.5 meq/L — AB (ref 20.0–24.0)
O2 SAT: 96 %
PO2 VEN: 78 mmHg — AB (ref 30.0–45.0)
TCO2: 20 mmol/L (ref 0–100)
pCO2, Ven: 28.6 mmHg — ABNORMAL LOW (ref 45.0–50.0)
pH, Ven: 7.441 — ABNORMAL HIGH (ref 7.250–7.300)

## 2015-07-23 MED ORDER — SODIUM CHLORIDE 0.9 % IV BOLUS (SEPSIS)
1000.0000 mL | Freq: Once | INTRAVENOUS | Status: AC
  Administered 2015-07-23: 1000 mL via INTRAVENOUS

## 2015-07-23 MED ORDER — HYDROMORPHONE HCL 1 MG/ML IJ SOLN
1.0000 mg | Freq: Once | INTRAMUSCULAR | Status: AC
  Administered 2015-07-23: 1 mg via INTRAVENOUS
  Filled 2015-07-23: qty 1

## 2015-07-23 MED ORDER — MORPHINE SULFATE (PF) 4 MG/ML IV SOLN
4.0000 mg | Freq: Once | INTRAVENOUS | Status: AC
  Administered 2015-07-23: 4 mg via INTRAVENOUS
  Filled 2015-07-23: qty 1

## 2015-07-23 MED ORDER — HYDROCODONE-ACETAMINOPHEN 5-325 MG PO TABS: ORAL_TABLET | ORAL | Status: DC

## 2015-07-23 MED ORDER — ONDANSETRON HCL 4 MG/2ML IJ SOLN
4.0000 mg | Freq: Once | INTRAMUSCULAR | Status: AC
  Administered 2015-07-23: 4 mg via INTRAVENOUS
  Filled 2015-07-23: qty 2

## 2015-07-23 NOTE — ED Notes (Signed)
Patient transported to X-ray 

## 2015-07-23 NOTE — ED Notes (Addendum)
Per EMS- pt here from [facility]. Right flank pain since Sunday. Facility has been giving him Vicodin for pain with relief. Diagnosed on Monday with aspiration pneumonia to right lobe due to aspirating on sprite. Reports pain with deep breaths to right flank. Pt has hx of stroke, weakness to left side.  Pt had chest xray at facility that confirmed pneumonia. Pt is already taking an antibiotic for this. Pt wanted to come in for the pain.

## 2015-07-23 NOTE — Telephone Encounter (Signed)
[facility]

## 2015-07-23 NOTE — Discharge Instructions (Signed)
Increase your Norco to 1-2 tablets by mouth every 6 hours as needed for pain. Not to exceed 3000mg  of APAP from all sources/24h. Use and incentive spirometer once an hour while awake. Continue taking Levaquin as prescribed. Follow up with your primary care doctor in 2 days for a recheck of symptoms. Return to the ED if you develop fever, worsening shortness of breath, or oxygen saturations below 90%.  Aspiration Pneumonia Aspiration pneumonia is an infection in your lungs. It occurs when food, liquid, or stomach contents (vomit) are inhaled (aspirated) into your lungs. When these things get into your lungs, swelling (inflammation) and infection can occur. This can make it difficult for you to breathe. Aspiration pneumonia is a serious condition and can be life threatening. RISK FACTORS Aspiration pneumonia is more likely to occur when a person's cough (gag) reflex or ability to swallow has been decreased. Some things that can do this include:   Having a brain injury or disease, such as stroke, seizures, Parkinson's disease, dementia, or amyotrophic lateral sclerosis (ALS).   Being given general anesthetic for procedures.   Being in a coma (unconscious).   Having a narrowing of the tube that carries food to the stomach (esophagus).   Drinking too much alcohol. If a person passes out and vomits, vomit can be swallowed into the lungs.   Taking certain medicines, such as tranquilizers or sedatives.  SIGNS AND SYMPTOMS   Coughing after swallowing food or liquids.   Breathing problems, such as wheezing or shortness of breath.   Bluish skin. This can be caused by lack of oxygen.   Coughing up food or mucus. The mucus might contain blood, greenish material, or yellowish-white fluid (pus).   Fever.   Chest pain.   Being more tired than usual (fatigue).   Sweating more than usual.   Bad breath.  DIAGNOSIS  A physical exam will be done. During the exam, the health care  provider will listen to your lungs with a stethoscope to check for:   Crackling sounds in the lungs.  Decreased breath sounds.  A rapid heartbeat. Various tests may be ordered. These may include:   Chest X-ray.   CT scan.   Swallowing study. This test looks at how food is swallowed and whether it goes into your breathing tube (trachea) or food pipe (esophagus).   Sputum culture. Saliva and mucus (sputum) are collected from the lungs or the tubes that carry air to the lungs (bronchi). The sputum is then tested for bacteria.   Bronchoscopy. This test uses a flexible tube (bronchoscope) to see inside the lungs. TREATMENT  Treatment will usually include antibiotic medicines. Other medicines may also be used to reduce fever or pain. You may need to be treated in the hospital. In the hospital, your breathing will be carefully monitored. Depending on how well you are breathing, you may need to be given oxygen, or you may need breathing support from a breathing machine (ventilator). For people who fail a swallowing study, a feeding tube might be placed in the stomach, or they may be asked to avoid certain food textures or liquids when they eat. HOME CARE INSTRUCTIONS   Carefully follow any special eating instructions you were given, such as avoiding certain food textures or thickening liquids. This reduces the risk of developing aspiration pneumonia again.  Only take over-the-counter or prescription medicines as directed by your health care provider. Follow the directions carefully.   If you were prescribed antibiotics, take them as directed. [employer]  them even if you start to feel better.   Rest as instructed by your health care provider.   Keep all follow-up appointments with your health care provider.  SEEK MEDICAL CARE IF:   You develop worsening shortness of breath, wheezing, or difficulty breathing.   You develop a fever.   You have chest pain.  MAKE SURE YOU:    Understand these instructions.  Will watch your condition.  Will get help right away if you are not doing well or get worse. Document Released: 08/22/2009 Document Revised: 10/30/2013 Document Reviewed: 04/12/2013 [facility] Patient Information 2015 [place], [place]. This information is not intended to replace advice given to you by your health care provider. Make sure you discuss any questions you have with your health care provider.

## 2015-07-23 NOTE — ED Provider Notes (Signed)
CSN: [id number]     Arrival date & time 07/23/15  2035 History   First MD Initiated Contact with Patient 07/23/15 2037     Chief Complaint  Patient presents with  . Pneumonia  . Pain     (Consider location/radiation/quality/duration/timing/severity/associated sxs/prior Treatment) HPI Comments: Patient is a 89 her old male with history of hypertension, dyslipidemia, diabetes mellitus, CVA with left-sided hemiparesis, dysphasia, and acute respiratory failure who presents to the emergency department from [facility] for further evaluation of right flank pain. Symptoms have been ongoing since Sunday, 4 days ago. Patient was noted to have aspirated some Sprite. He had a chest x-ray completed 3 days ago, Monday, which showed aspiration pneumonia. Patient has been receiving Vicodin for pain and antibiotics for pneumonia. Patient wanted to come into the emergency department for worsening pain. He states that pain is intermittent and worse with deep breathing, coughing, and movement. He states that he feels slightly short of breath. Patient had one episode of emesis this morning. He denies any other vomiting or nausea at this time. He has had no central chest pain or known fever. No hx of chronic O2 requirement.  Patient is a 54 y.o. male presenting with pneumonia. The history is provided by the patient, the EMS personnel and the nursing home. No language interpreter was used.  Pneumonia Associated symptoms include chest pain, coughing and vomiting. Pertinent negatives include no abdominal pain, fever or nausea.    Past Medical History  Diagnosis Date  . Stroke   . Hypertension   . Hyperlipidemia   . Diabetes mellitus without complication   . Fall at home   . Hemiparesis affecting left side as late effect of stroke   . Dysphagia as late effect of cerebrovascular disease   . Acute respiratory failure   . Cerebral edema    Past Surgical History  Procedure Laterality Date  . Gastrostomy tube  placement  05-19-15  . Mechanical thrombectomy angioplasty stenet placement     Family History  Problem Relation Age of Onset  . Family history unknown: Yes   Social History  Substance Use Topics  . Smoking status: Current Every Day Smoker    Types: Cigarettes  . Smokeless tobacco: None  . Alcohol Use: None    Review of Systems  Constitutional: Negative for fever.  Respiratory: Positive for cough and shortness of breath.   Cardiovascular: Positive for chest pain.  Gastrointestinal: Positive for vomiting. Negative for nausea and abdominal pain.  All other systems reviewed and are negative.   Allergies  Review of patient's allergies indicates no known allergies.  Home Medications   Prior to Admission medications   Medication Sig Start Date End Date Taking? Authorizing Provider  acetaminophen (TYLENOL) 325 MG tablet Take 325-650 mg by mouth every 6 (six) hours as needed.   Yes Historical Provider, MD  amantadine (SYMMETREL) 100 MG capsule Take 200 mg by mouth 2 (two) times daily.   Yes Historical Provider, MD  amLODipine (NORVASC) 10 MG tablet Take 10 mg by mouth daily.   Yes Historical Provider, MD  aspirin 81 MG tablet Take 81 mg by mouth daily.   Yes Historical Provider, MD  atorvastatin (LIPITOR) 80 MG tablet Take 80 mg by mouth daily at 6 PM.   Yes Historical Provider, MD  clopidogrel (PLAVIX) 75 MG tablet Take 75 mg by mouth daily.   Yes Historical Provider, MD  Fenofibrate 40 MG TABS Take 1 tablet by mouth every evening.   Yes Historical Provider,  MD  FLUoxetine (PROZAC) 20 MG capsule Take 20 mg by mouth daily.   Yes Historical Provider, MD  hydrALAZINE (APRESOLINE) 10 MG tablet Take 10 mg by mouth 3 (three) times daily.   Yes Historical Provider, MD  hydrochlorothiazide (HYDRODIURIL) 25 MG tablet Take 25 mg by mouth daily.   Yes Historical Provider, MD  HYDROcodone-acetaminophen (NORCO/VICODIN) 5-325 MG per tablet Take one tablet by mouth every 6 hours as needed for pain.  Not to exceed 3000mg  of APAP from all sources/24h 07/23/15  Yes Gildardo Cranker, DO  levofloxacin (LEVAQUIN) 500 MG tablet Take 500 mg by mouth daily. Unknown last dose or start of medication   Yes Historical Provider, MD  lisinopril (PRINIVIL,ZESTRIL) 40 MG tablet Take 40 mg by mouth daily.   Yes Historical Provider, MD  MINOXIDIL PO Take 5 mg by mouth daily.   Yes Historical Provider, MD  Multiple Vitamins tablet Take 1 tablet by mouth daily.   Yes Historical Provider, MD  sennosides-docusate sodium (SENOKOT-S) 8.6-50 MG tablet Take 2 tablets by mouth 2 (two) times daily.   Yes Historical Provider, MD   BP 107/70 mmHg  Pulse 85  Temp(Src) 97.7 F (36.5 C) (Oral)  Resp 21  SpO2 96%   Physical Exam  Constitutional: He is oriented to person, place, and time. He appears well-developed and well-nourished. No distress.  Nontoxic/nonseptic appearing. Patient seems uncomfortable.  HENT:  Head: Normocephalic and atraumatic.  Eyes: Conjunctivae and EOM are normal. No scleral icterus.  Neck: Normal range of motion.  Cardiovascular: Normal rate, regular rhythm and intact distal pulses.   Pulmonary/Chest: Effort normal. No tachypnea. No respiratory distress. He has decreased breath sounds in the right lower field. He has no wheezes. He has rhonchi.  Decreased breath sounds in RLL. Rhonchi noted in b/l lung fields. Mild dyspnea without tachypnea. No accessory muscle use. Chest expansion symmetric.  Musculoskeletal: Normal range of motion.  Neurological: He is alert and oriented to person, place, and time. Coordination normal.  Skin: Skin is warm and dry. No rash noted. He is not diaphoretic. No erythema. No pallor.  Psychiatric: He has a normal mood and affect. His behavior is normal.  Nursing note and vitals reviewed.   ED Course  Procedures (including critical care time) Labs Review Labs Reviewed  CBC WITH DIFFERENTIAL/PLATELET - Abnormal; Notable for the following:    WBC 10.7 (*)    RBC  3.51 (*)    Hemoglobin 10.2 (*)    HCT 29.8 (*)    Platelets 141 (*)    Neutro Abs 8.5 (*)    Monocytes Absolute 1.2 (*)    All other components within normal limits  BASIC METABOLIC PANEL - Abnormal; Notable for the following:    CO2 19 (*)    Glucose, Bld 130 (*)    BUN 42 (*)    Creatinine, Ser 3.11 (*)    GFR calc non Af Amer 21 (*)    GFR calc Af Amer 24 (*)    All other components within normal limits  I-STAT VENOUS BLOOD GAS, ED - Abnormal; Notable for the following:    pH, Ven 7.441 (*)    pCO2, Ven 28.6 (*)    pO2, Ven 78.0 (*)    Bicarbonate 19.5 (*)    Acid-base deficit 4.0 (*)    All other components within normal limits  CULTURE, EXPECTORATED SPUTUM-ASSESSMENT    Imaging Review Dg Chest 2 View  07/23/2015   CLINICAL DATA:  Right-sided chest pain. Shortness of breath.  Pneumonia.  EXAM: CHEST  2 VIEW  COMPARISON:  None.  FINDINGS: Heart size is within normal limits. Low lung volumes are seen. Elevation of right hemidiaphragm noted. Mild opacity is seen in right lower lobe, suspicious for pneumonia. No evidence of pneumothorax or pleural effusion. Gaseous distention of visualized colon seen in upper abdomen.  IMPRESSION: Low lung volumes and elevation of right hemidiaphragm limit evaluation. Right lower lobe opacity suspicious for pneumonia.  Gaseous distention of visualized colon.   Electronically Signed   By: Earle Gell M.D.   On: 07/23/2015 21:55     I have personally reviewed and evaluated these images and lab results as part of my medical decision-making.   EKG Interpretation   Date/Time:  Wednesday July 23 2015 20:53:52 EDT Ventricular Rate:  89 PR Interval:  143 QRS Duration: 96 QT Interval:  399 QTC Calculation: 485 R Axis:   18 Text Interpretation:  Sinus rhythm Borderline T abnormalities, lateral  leads Borderline prolonged QT interval no previous Confirmed by ZACKOWSKI   MD, SCOTT [id number]) on 07/23/2015 8:56:34 PM      MDM   Final diagnoses:   Right lower lobe pneumonia    53 year old male presents to the emergency department for right flank pain. Patient was diagnosed with aspiration pneumonia. He is currently on a course of Levaquin, started by his doctor at [facility]. X-ray today reveals right lower lobe pneumonia consistent with history and location of pain. Patient has no significant leukocytosis today. No left shift or fever. Patient has had no hypoxia while in the emergency department. Vitals have remained stable. Prolonged QTc of 485 likely secondary to Levaquin use.  Patient given Dilaudid and morphine for pain control. Will also add incentive spirometer to ensure that patient continues with deep breathing. Will increase patient's Norco from 1 tablet every 6 hours to 1-2 tablets every 6 hours for pain control. He has been advised to continue his Levaquin. No indication for further emergent workup or admission at this time. Patient stable for discharge back to [facility]; will transport via [place].   Filed Vitals:   07/23/15 2315 07/23/15 2330 07/23/15 2345 07/23/15 2352  BP: 132/79 123/79 151/83   Pulse: 85 85 99   Temp:    98 F (36.7 C)  TempSrc:    Oral  Resp: 28 25 25    SpO2: 94% 94% 92%      Antonietta Breach, PA-C 07/24/15 0009  Fredia Sorrow, MD 07/24/15 1615

## 2015-07-24 ENCOUNTER — Ambulatory Visit (HOSPITAL_COMMUNITY)
Admission: RE | Admit: 2015-07-24 | Discharge: 2015-07-24 | Disposition: A | Discharge status: Home / Self Care | Payer: Self-pay | Source: Ambulatory Visit | Attending: Internal Medicine | Admitting: Internal Medicine

## 2015-07-24 DIAGNOSIS — J690 Pneumonitis due to inhalation of food and vomit: Secondary | ICD-10-CM

## 2015-07-24 DIAGNOSIS — Z8673 Personal history of transient ischemic attack (TIA), and cerebral infarction without residual deficits: Secondary | ICD-10-CM | POA: Insufficient documentation

## 2015-07-24 NOTE — ED Notes (Signed)
Pt oriented how to use the IS, teach back used, pt did up to 500.

## 2015-07-28 DIAGNOSIS — I63411 Cerebral infarction due to embolism of right middle cerebral artery: Secondary | ICD-10-CM | POA: Diagnosis not present

## 2015-07-28 DIAGNOSIS — I69154 Hemiplegia and hemiparesis following nontraumatic intracerebral hemorrhage affecting left non-dominant side: Secondary | ICD-10-CM | POA: Diagnosis not present

## 2015-07-28 DIAGNOSIS — E782 Mixed hyperlipidemia: Secondary | ICD-10-CM | POA: Diagnosis not present

## 2015-07-28 DIAGNOSIS — H539 Unspecified visual disturbance: Secondary | ICD-10-CM | POA: Diagnosis present

## 2015-07-28 DIAGNOSIS — F05 Delirium due to known physiological condition: Secondary | ICD-10-CM | POA: Diagnosis not present

## 2015-07-28 DIAGNOSIS — M6250 Muscle wasting and atrophy, not elsewhere classified, unspecified site: Secondary | ICD-10-CM | POA: Diagnosis not present

## 2015-07-28 DIAGNOSIS — I69391 Dysphagia following cerebral infarction: Secondary | ICD-10-CM | POA: Diagnosis not present

## 2015-07-28 DIAGNOSIS — I10 Essential (primary) hypertension: Secondary | ICD-10-CM | POA: Diagnosis not present

## 2015-07-28 DIAGNOSIS — Z87891 Personal history of nicotine dependence: Secondary | ICD-10-CM | POA: Diagnosis not present

## 2015-07-28 DIAGNOSIS — J90 Pleural effusion, not elsewhere classified: Secondary | ICD-10-CM | POA: Diagnosis not present

## 2015-07-28 DIAGNOSIS — I69392 Facial weakness following cerebral infarction: Secondary | ICD-10-CM | POA: Diagnosis not present

## 2015-07-28 DIAGNOSIS — R110 Nausea: Secondary | ICD-10-CM | POA: Diagnosis not present

## 2015-07-28 DIAGNOSIS — E119 Type 2 diabetes mellitus without complications: Secondary | ICD-10-CM | POA: Diagnosis not present

## 2015-07-28 DIAGNOSIS — J168 Pneumonia due to other specified infectious organisms: Secondary | ICD-10-CM | POA: Diagnosis not present

## 2015-07-28 DIAGNOSIS — I6350 Cerebral infarction due to unspecified occlusion or stenosis of unspecified cerebral artery: Secondary | ICD-10-CM | POA: Diagnosis not present

## 2015-07-28 DIAGNOSIS — R1011 Right upper quadrant pain: Secondary | ICD-10-CM | POA: Diagnosis not present

## 2015-07-28 DIAGNOSIS — Z95828 Presence of other vascular implants and grafts: Secondary | ICD-10-CM | POA: Insufficient documentation

## 2015-07-28 DIAGNOSIS — R918 Other nonspecific abnormal finding of lung field: Secondary | ICD-10-CM | POA: Diagnosis not present

## 2015-07-28 DIAGNOSIS — T17908A Unspecified foreign body in respiratory tract, part unspecified causing other injury, initial encounter: Secondary | ICD-10-CM | POA: Diagnosis not present

## 2015-07-28 DIAGNOSIS — F339 Major depressive disorder, recurrent, unspecified: Secondary | ICD-10-CM | POA: Diagnosis not present

## 2015-07-28 DIAGNOSIS — N179 Acute kidney failure, unspecified: Secondary | ICD-10-CM | POA: Diagnosis not present

## 2015-07-28 DIAGNOSIS — I69398 Other sequelae of cerebral infarction: Secondary | ICD-10-CM | POA: Diagnosis not present

## 2015-07-28 DIAGNOSIS — I6339 Cerebral infarction due to thrombosis of other cerebral artery: Secondary | ICD-10-CM | POA: Diagnosis not present

## 2015-07-28 DIAGNOSIS — F17290 Nicotine dependence, other tobacco product, uncomplicated: Secondary | ICD-10-CM | POA: Diagnosis not present

## 2015-07-28 DIAGNOSIS — M7989 Other specified soft tissue disorders: Secondary | ICD-10-CM | POA: Diagnosis not present

## 2015-07-28 DIAGNOSIS — J189 Pneumonia, unspecified organism: Secondary | ICD-10-CM | POA: Diagnosis not present

## 2015-07-28 DIAGNOSIS — G8194 Hemiplegia, unspecified affecting left nondominant side: Secondary | ICD-10-CM | POA: Diagnosis present

## 2015-07-28 DIAGNOSIS — I4581 Long QT syndrome: Secondary | ICD-10-CM | POA: Diagnosis not present

## 2015-07-28 DIAGNOSIS — Z789 Other specified health status: Secondary | ICD-10-CM | POA: Diagnosis not present

## 2015-07-28 DIAGNOSIS — R1310 Dysphagia, unspecified: Secondary | ICD-10-CM | POA: Diagnosis not present

## 2015-07-28 DIAGNOSIS — R0602 Shortness of breath: Secondary | ICD-10-CM | POA: Diagnosis not present

## 2015-07-28 DIAGNOSIS — M6281 Muscle weakness (generalized): Secondary | ICD-10-CM | POA: Diagnosis not present

## 2015-07-28 DIAGNOSIS — J690 Pneumonitis due to inhalation of food and vomit: Secondary | ICD-10-CM | POA: Diagnosis present

## 2015-07-28 DIAGNOSIS — I69321 Dysphasia following cerebral infarction: Secondary | ICD-10-CM | POA: Diagnosis not present

## 2015-07-28 DIAGNOSIS — R809 Proteinuria, unspecified: Secondary | ICD-10-CM | POA: Diagnosis not present

## 2015-07-28 DIAGNOSIS — R319 Hematuria, unspecified: Secondary | ICD-10-CM | POA: Diagnosis present

## 2015-07-28 DIAGNOSIS — Z8673 Personal history of transient ischemic attack (TIA), and cerebral infarction without residual deficits: Secondary | ICD-10-CM | POA: Diagnosis not present

## 2015-07-28 DIAGNOSIS — R079 Chest pain, unspecified: Secondary | ICD-10-CM | POA: Diagnosis not present

## 2015-07-28 DIAGNOSIS — D649 Anemia, unspecified: Secondary | ICD-10-CM | POA: Diagnosis present

## 2015-07-28 DIAGNOSIS — R7303 Prediabetes: Secondary | ICD-10-CM | POA: Insufficient documentation

## 2015-07-28 DIAGNOSIS — R509 Fever, unspecified: Secondary | ICD-10-CM | POA: Diagnosis not present

## 2015-07-28 DIAGNOSIS — I69354 Hemiplegia and hemiparesis following cerebral infarction affecting left non-dominant side: Secondary | ICD-10-CM | POA: Diagnosis not present

## 2015-07-28 DIAGNOSIS — R0781 Pleurodynia: Secondary | ICD-10-CM | POA: Diagnosis present

## 2015-07-28 DIAGNOSIS — R1012 Left upper quadrant pain: Secondary | ICD-10-CM | POA: Diagnosis not present

## 2015-07-31 DIAGNOSIS — J189 Pneumonia, unspecified organism: Secondary | ICD-10-CM | POA: Diagnosis not present

## 2015-07-31 DIAGNOSIS — E876 Hypokalemia: Secondary | ICD-10-CM | POA: Diagnosis present

## 2015-07-31 DIAGNOSIS — F339 Major depressive disorder, recurrent, unspecified: Secondary | ICD-10-CM | POA: Diagnosis not present

## 2015-07-31 DIAGNOSIS — I6350 Cerebral infarction due to unspecified occlusion or stenosis of unspecified cerebral artery: Secondary | ICD-10-CM | POA: Diagnosis not present

## 2015-07-31 DIAGNOSIS — N179 Acute kidney failure, unspecified: Secondary | ICD-10-CM | POA: Diagnosis not present

## 2015-07-31 DIAGNOSIS — R1313 Dysphagia, pharyngeal phase: Secondary | ICD-10-CM | POA: Diagnosis present

## 2015-07-31 DIAGNOSIS — R1311 Dysphagia, oral phase: Secondary | ICD-10-CM | POA: Diagnosis present

## 2015-07-31 DIAGNOSIS — E1122 Type 2 diabetes mellitus with diabetic chronic kidney disease: Secondary | ICD-10-CM | POA: Diagnosis present

## 2015-07-31 DIAGNOSIS — K2980 Duodenitis without bleeding: Secondary | ICD-10-CM | POA: Diagnosis present

## 2015-07-31 DIAGNOSIS — Z7902 Long term (current) use of antithrombotics/antiplatelets: Secondary | ICD-10-CM | POA: Diagnosis not present

## 2015-07-31 DIAGNOSIS — Z87891 Personal history of nicotine dependence: Secondary | ICD-10-CM | POA: Diagnosis not present

## 2015-07-31 DIAGNOSIS — E119 Type 2 diabetes mellitus without complications: Secondary | ICD-10-CM | POA: Diagnosis not present

## 2015-07-31 DIAGNOSIS — I69391 Dysphagia following cerebral infarction: Secondary | ICD-10-CM | POA: Diagnosis not present

## 2015-07-31 DIAGNOSIS — I6930 Unspecified sequelae of cerebral infarction: Secondary | ICD-10-CM | POA: Diagnosis not present

## 2015-07-31 DIAGNOSIS — M6281 Muscle weakness (generalized): Secondary | ICD-10-CM | POA: Diagnosis not present

## 2015-07-31 DIAGNOSIS — I63411 Cerebral infarction due to embolism of right middle cerebral artery: Secondary | ICD-10-CM | POA: Diagnosis not present

## 2015-07-31 DIAGNOSIS — I69854 Hemiplegia and hemiparesis following other cerebrovascular disease affecting left non-dominant side: Secondary | ICD-10-CM | POA: Diagnosis not present

## 2015-07-31 DIAGNOSIS — I4581 Long QT syndrome: Secondary | ICD-10-CM | POA: Diagnosis not present

## 2015-07-31 DIAGNOSIS — J168 Pneumonia due to other specified infectious organisms: Secondary | ICD-10-CM | POA: Diagnosis not present

## 2015-07-31 DIAGNOSIS — R4182 Altered mental status, unspecified: Secondary | ICD-10-CM | POA: Diagnosis not present

## 2015-07-31 DIAGNOSIS — I6339 Cerebral infarction due to thrombosis of other cerebral artery: Secondary | ICD-10-CM | POA: Diagnosis not present

## 2015-07-31 DIAGNOSIS — K2960 Other gastritis without bleeding: Secondary | ICD-10-CM | POA: Diagnosis present

## 2015-07-31 DIAGNOSIS — T17908A Unspecified foreign body in respiratory tract, part unspecified causing other injury, initial encounter: Secondary | ICD-10-CM | POA: Diagnosis not present

## 2015-07-31 DIAGNOSIS — Z23 Encounter for immunization: Secondary | ICD-10-CM | POA: Diagnosis not present

## 2015-07-31 DIAGNOSIS — R1310 Dysphagia, unspecified: Secondary | ICD-10-CM | POA: Diagnosis not present

## 2015-07-31 DIAGNOSIS — F329 Major depressive disorder, single episode, unspecified: Secondary | ICD-10-CM | POA: Diagnosis not present

## 2015-07-31 DIAGNOSIS — I69154 Hemiplegia and hemiparesis following nontraumatic intracerebral hemorrhage affecting left non-dominant side: Secondary | ICD-10-CM | POA: Diagnosis not present

## 2015-07-31 DIAGNOSIS — I129 Hypertensive chronic kidney disease with stage 1 through stage 4 chronic kidney disease, or unspecified chronic kidney disease: Secondary | ICD-10-CM | POA: Diagnosis present

## 2015-07-31 DIAGNOSIS — I63511 Cerebral infarction due to unspecified occlusion or stenosis of right middle cerebral artery: Secondary | ICD-10-CM | POA: Diagnosis not present

## 2015-07-31 DIAGNOSIS — Z7982 Long term (current) use of aspirin: Secondary | ICD-10-CM | POA: Diagnosis not present

## 2015-07-31 DIAGNOSIS — R079 Chest pain, unspecified: Secondary | ICD-10-CM | POA: Diagnosis not present

## 2015-07-31 DIAGNOSIS — E785 Hyperlipidemia, unspecified: Secondary | ICD-10-CM | POA: Diagnosis not present

## 2015-07-31 DIAGNOSIS — R1011 Right upper quadrant pain: Secondary | ICD-10-CM | POA: Diagnosis not present

## 2015-07-31 DIAGNOSIS — D62 Acute posthemorrhagic anemia: Secondary | ICD-10-CM | POA: Diagnosis not present

## 2015-07-31 DIAGNOSIS — K922 Gastrointestinal hemorrhage, unspecified: Secondary | ICD-10-CM | POA: Diagnosis not present

## 2015-07-31 DIAGNOSIS — I69354 Hemiplegia and hemiparesis following cerebral infarction affecting left non-dominant side: Secondary | ICD-10-CM | POA: Diagnosis not present

## 2015-07-31 DIAGNOSIS — I639 Cerebral infarction, unspecified: Secondary | ICD-10-CM | POA: Diagnosis not present

## 2015-07-31 DIAGNOSIS — Z9981 Dependence on supplemental oxygen: Secondary | ICD-10-CM | POA: Diagnosis not present

## 2015-07-31 DIAGNOSIS — K921 Melena: Secondary | ICD-10-CM | POA: Diagnosis not present

## 2015-07-31 DIAGNOSIS — M6250 Muscle wasting and atrophy, not elsewhere classified, unspecified site: Secondary | ICD-10-CM | POA: Diagnosis not present

## 2015-07-31 DIAGNOSIS — N184 Chronic kidney disease, stage 4 (severe): Secondary | ICD-10-CM | POA: Diagnosis present

## 2015-07-31 DIAGNOSIS — F17290 Nicotine dependence, other tobacco product, uncomplicated: Secondary | ICD-10-CM | POA: Diagnosis not present

## 2015-07-31 DIAGNOSIS — E782 Mixed hyperlipidemia: Secondary | ICD-10-CM | POA: Diagnosis not present

## 2015-07-31 DIAGNOSIS — E1149 Type 2 diabetes mellitus with other diabetic neurological complication: Secondary | ICD-10-CM | POA: Diagnosis present

## 2015-07-31 DIAGNOSIS — E1140 Type 2 diabetes mellitus with diabetic neuropathy, unspecified: Secondary | ICD-10-CM | POA: Diagnosis not present

## 2015-07-31 DIAGNOSIS — I67848 Other cerebrovascular vasospasm and vasoconstriction: Secondary | ICD-10-CM | POA: Diagnosis not present

## 2015-07-31 DIAGNOSIS — I10 Essential (primary) hypertension: Secondary | ICD-10-CM | POA: Diagnosis not present

## 2015-07-31 DIAGNOSIS — R0602 Shortness of breath: Secondary | ICD-10-CM | POA: Diagnosis not present

## 2015-07-31 DIAGNOSIS — F05 Delirium due to known physiological condition: Secondary | ICD-10-CM | POA: Diagnosis not present

## 2015-07-31 DIAGNOSIS — R1012 Left upper quadrant pain: Secondary | ICD-10-CM | POA: Diagnosis not present

## 2015-07-31 DIAGNOSIS — J690 Pneumonitis due to inhalation of food and vomit: Secondary | ICD-10-CM | POA: Diagnosis present

## 2015-08-05 ENCOUNTER — Encounter: Payer: Self-pay | Admitting: Internal Medicine

## 2015-08-05 ENCOUNTER — Non-Acute Institutional Stay (SKILLED_NURSING_FACILITY): Payer: Medicare Other | Admitting: Internal Medicine

## 2015-08-05 DIAGNOSIS — E1149 Type 2 diabetes mellitus with other diabetic neurological complication: Secondary | ICD-10-CM

## 2015-08-05 DIAGNOSIS — I6350 Cerebral infarction due to unspecified occlusion or stenosis of unspecified cerebral artery: Secondary | ICD-10-CM

## 2015-08-05 DIAGNOSIS — F329 Major depressive disorder, single episode, unspecified: Secondary | ICD-10-CM

## 2015-08-05 DIAGNOSIS — I10 Essential (primary) hypertension: Secondary | ICD-10-CM

## 2015-08-05 DIAGNOSIS — E785 Hyperlipidemia, unspecified: Secondary | ICD-10-CM | POA: Diagnosis not present

## 2015-08-05 DIAGNOSIS — IMO0002 Reserved for concepts with insufficient information to code with codable children: Secondary | ICD-10-CM

## 2015-08-05 DIAGNOSIS — I69854 Hemiplegia and hemiparesis following other cerebrovascular disease affecting left non-dominant side: Secondary | ICD-10-CM | POA: Diagnosis not present

## 2015-08-05 DIAGNOSIS — E1140 Type 2 diabetes mellitus with diabetic neuropathy, unspecified: Secondary | ICD-10-CM

## 2015-08-05 DIAGNOSIS — I69391 Dysphagia following cerebral infarction: Secondary | ICD-10-CM | POA: Diagnosis not present

## 2015-08-05 DIAGNOSIS — I69354 Hemiplegia and hemiparesis following cerebral infarction affecting left non-dominant side: Secondary | ICD-10-CM

## 2015-08-05 DIAGNOSIS — J189 Pneumonia, unspecified organism: Secondary | ICD-10-CM

## 2015-08-05 NOTE — Progress Notes (Signed)
Patient ID: [name] [name], male   DOB: 29-Apr-1961, 54 y.o.   MRN: [id number]    HISTORY AND PHYSICAL   DATE: 08/05/15  Location:  [facility]    Place of Service: SNF [phone])   Extended Emergency Contact Information Primary Emergency Contact: [place] Phone: [phone] Relation: None Secondary Emergency Contact: [name],Nicole  [place] [employer] Phone: [phone] Relation: Daughter  Advanced Directive information  FULL CODE  Chief Complaint  Patient presents with  . Readmit To SNF    HPI:  54 yo male seen today as a readmission into SNF following hospital stay at [facility] for pneumonia presumed HCAP +/-aspiration, AKI, RUQ pain, dysphagia with hx CVA, HTN. Cr 2.6 on admision with elevated liver enzymes (AST 85; ALT 54 with Tbili 1.8, alk phos 202). CXR revealed focal RLL consolidation and b/l opacities. Hepatitis panel and HIV neg. Neg SPEP. GGT elevated. CT abd/pelvis was neg for cause of RUQ pain and elevated liver enzymes. He was d/c'd on avelox (substutiuted to levaquin at SNF) to complete a 7 day course. IVF given and Meds adjusted due to renal insufficiency. Cr 1.7 at d/c  HTN - BP fluctuating on hydralazine, minoxidil and norvasc. He takes ASA daily. No HA or dizziness  hx CVA w left hemiparesis - no new sx's on plavix and ASA. He has pain in contracted extremities that is controlled on oxycodone. He continues to have dysphagia and is followed by speech  Depression - mood stable on prozac  DM - CBG 177 today. No low BS reactions. He is diet controlled  Hyperlipidemia - stable on fenofibrate  He is a poor historian due to CVA with slurred speech and aphasia. He denies cough, SOB f/c. Continues to have left hemiparesis. Hx obtained from chart. He expressed to the staff that he really wanted to go home but explained to pt that he is not medically stable to leave the facility  yet. He has agreed to stay an additional week   Past Medical History  Diagnosis Date  . Stroke   . Hypertension   . Hyperlipidemia   . Diabetes mellitus without complication   . Fall at home   . Hemiparesis affecting left side as late effect of stroke   . Dysphagia as late effect of cerebrovascular disease   . Acute respiratory failure   . Cerebral edema     Past Surgical History  Procedure Laterality Date  . Gastrostomy tube placement  05-19-15  . Mechanical thrombectomy angioplasty stenet placement      No care team member to display  Social History   Social History  . Marital Status: Single    Spouse Name: N/A  . Number of Children: N/A  . Years of Education: N/A   Occupational History  . Not on file.   Social History Main Topics  . Smoking status: Current Every Day Smoker    Types: Cigarettes  . Smokeless tobacco: Not on file  . Alcohol Use: Not on file  . Drug Use: Not on file  . Sexual Activity: Not on file   Other Topics Concern  . Not on file   Social History Narrative     reports that he has been smoking Cigarettes.  He does not have any smokeless tobacco history on file. His alcohol and drug histories are not on file.  Family History  Problem Relation Age  of Onset  . Family history unknown: Yes   No family status information on file.     There is no immunization history on file for this patient.  No Known Allergies  Medications: Patient's Medications  New Prescriptions   No medications on file  Previous Medications   ACETAMINOPHEN (TYLENOL) 325 MG TABLET    Take 325-650 mg by mouth every 6 (six) hours as needed.   AMANTADINE (SYMMETREL) 100 MG CAPSULE    Take 200 mg by mouth 2 (two) times daily.   AMLODIPINE (NORVASC) 10 MG TABLET    Take 10 mg by mouth daily.   ASPIRIN 81 MG TABLET    Take 81 mg by mouth daily.   ATORVASTATIN (LIPITOR) 80 MG TABLET    Take 80 mg by mouth daily at 6 PM.   CLOPIDOGREL (PLAVIX) 75 MG TABLET    Take 75  mg by mouth daily.   FENOFIBRATE 40 MG TABS    Take 1 tablet by mouth every evening.   FLUOXETINE (PROZAC) 20 MG CAPSULE    Take 20 mg by mouth daily.   HYDRALAZINE (APRESOLINE) 10 MG TABLET    Take 10 mg by mouth 3 (three) times daily.   HYDROCHLOROTHIAZIDE (HYDRODIURIL) 25 MG TABLET    Take 25 mg by mouth daily.   HYDROCODONE-ACETAMINOPHEN (NORCO/VICODIN) 5-325 MG PER TABLET    Take one tablet by mouth every 6 hours as needed for pain. Not to exceed 3077m of APAP from all sources/24h   LEVOFLOXACIN (LEVAQUIN) 500 MG TABLET    Take 500 mg by mouth daily. Unknown last dose or start of medication   LISINOPRIL (PRINIVIL,ZESTRIL) 40 MG TABLET    Take 40 mg by mouth daily.   MINOXIDIL PO    Take 5 mg by mouth daily.   MULTIPLE VITAMINS TABLET    Take 1 tablet by mouth daily.   SENNOSIDES-DOCUSATE SODIUM (SENOKOT-S) 8.6-50 MG TABLET    Take 2 tablets by mouth 2 (two) times daily.  Modified Medications   No medications on file  Discontinued Medications   No medications on file    Review of Systems  Unable to perform ROS: Other  aphasia/slurred speech   Filed Vitals:   08/05/15 2302  BP: 169/98  Pulse: 74  Temp: 98.8 F (37.1 C)  Weight: 230 lb (104.327 kg)   Body mass index is 31.19 kg/(m^2).  Physical Exam  Constitutional: He appears well-developed and well-nourished.  Lying in bed  HENT:  Mouth/Throat: Oropharynx is clear and moist.  Eyes: Pupils are equal, round, and reactive to light. No scleral icterus.  Neck: Neck supple. Carotid bruit is not present.  Cardiovascular: Normal rate, regular rhythm and intact distal pulses.  Exam reveals no gallop and no friction rub.   Murmur (1/6 SEM) heard. no distal LE swelling. No calf TTP  Pulmonary/Chest: Effort normal and breath sounds normal. He has no wheezes. He has no rales. He exhibits no tenderness.  Reduced BS at left base but otherwise CTA  Abdominal: Soft. Bowel sounds are normal. He exhibits no distension, no abdominal  bruit, no pulsatile midline mass and no mass. There is no tenderness. There is no rebound and no guarding.  Musculoskeletal: He exhibits edema (in LUEwith flexion contracture present) and tenderness.  Lymphadenopathy:    He has no cervical adenopathy.  Neurological: He is alert. He exhibits abnormal muscle tone.  Strength 2/5 in left hand compared to LLE  Skin: Skin is warm and dry. No rash noted.  Psychiatric: He has a normal mood and affect. His behavior is normal. Thought content normal. His speech is slurred.  Expressive aphasia     Labs reviewed: Admission on 07/23/2015, Discharged on 07/24/2015  Component Date Value Ref Range Status  . WBC 07/23/2015 10.7* 4.0 - 10.5 K/uL Final  . RBC 07/23/2015 3.51* 4.22 - 5.81 MIL/uL Final  . Hemoglobin 07/23/2015 10.2* 13.0 - 17.0 g/dL Final  . HCT 07/23/2015 29.8* 39.0 - 52.0 % Final  . MCV 07/23/2015 84.9  78.0 - 100.0 fL Final  . MCH 07/23/2015 29.1  26.0 - 34.0 pg Final  . MCHC 07/23/2015 34.2  30.0 - 36.0 g/dL Final  . RDW 07/23/2015 13.9  11.5 - 15.5 % Final  . Platelets 07/23/2015 141* 150 - 400 K/uL Final  . Neutrophils Relative % 07/23/2015 79   Final  . Neutro Abs 07/23/2015 8.5* 1.7 - 7.7 K/uL Final  . Lymphocytes Relative 07/23/2015 9   Final  . Lymphs Abs 07/23/2015 1.0  0.7 - 4.0 K/uL Final  . Monocytes Relative 07/23/2015 11   Final  . Monocytes Absolute 07/23/2015 1.2* 0.1 - 1.0 K/uL Final  . Eosinophils Relative 07/23/2015 1   Final  . Eosinophils Absolute 07/23/2015 0.1  0.0 - 0.7 K/uL Final  . Basophils Relative 07/23/2015 0   Final  . Basophils Absolute 07/23/2015 0.0  0.0 - 0.1 K/uL Final  . Sodium 07/23/2015 137  135 - 145 mmol/L Final  . Potassium 07/23/2015 3.7  3.5 - 5.1 mmol/L Final  . Chloride 07/23/2015 104  101 - 111 mmol/L Final  . CO2 07/23/2015 19* 22 - 32 mmol/L Final  . Glucose, Bld 07/23/2015 130* 65 - 99 mg/dL Final  . BUN 07/23/2015 42* 6 - 20 mg/dL Final  . Creatinine, Ser 07/23/2015 3.11* 0.61 -  1.24 mg/dL Final  . Calcium 07/23/2015 9.3  8.9 - 10.3 mg/dL Final  . GFR calc non Af Amer 07/23/2015 21* >60 mL/min Final  . GFR calc Af Amer 07/23/2015 24* >60 mL/min Final   Comment: (NOTE) The eGFR has been calculated using the CKD EPI equation. This calculation has not been validated in all clinical situations. eGFR's persistently <60 mL/min signify possible Chronic Kidney Disease.   . Anion gap 07/23/2015 14  5 - 15 Final  . pH, Ven 07/23/2015 7.441* 7.250 - 7.300 Final  . pCO2, Ven 07/23/2015 28.6* 45.0 - 50.0 mmHg Final  . pO2, Ven 07/23/2015 78.0* 30.0 - 45.0 mmHg Final  . Bicarbonate 07/23/2015 19.5* 20.0 - 24.0 mEq/L Final  . TCO2 07/23/2015 20  0 - 100 mmol/L Final  . O2 Saturation 07/23/2015 96.0   Final  . Acid-base deficit 07/23/2015 4.0* 0.0 - 2.0 mmol/L Final  . Sample type 07/23/2015 VENOUS   Final    Dg Chest 2 View  07/23/2015   CLINICAL DATA:  Right-sided chest pain. Shortness of breath. Pneumonia.  EXAM: CHEST  2 VIEW  COMPARISON:  None.  FINDINGS: Heart size is within normal limits. Low lung volumes are seen. Elevation of right hemidiaphragm noted. Mild opacity is seen in right lower lobe, suspicious for pneumonia. No evidence of pneumothorax or pleural effusion. Gaseous distention of visualized colon seen in upper abdomen.  IMPRESSION: Low lung volumes and elevation of right hemidiaphragm limit evaluation. Right lower lobe opacity suspicious for pneumonia.  Gaseous distention of visualized colon.   Electronically Signed   By: Earle Gell M.D.   On: 07/23/2015 21:55   Dg Op Swallowing Func-medicare/speech Path  07/24/2015    Objective Swallowing Evaluation:   Modified Barium Swallow Patient Details  Name: [name] [name] MRN: [id number] Date of Birth: January 07, 1961  Today's Date: 07/24/2015 Time: SLP Start Time (ACUTE ONLY): 1110-SLP Stop Time (ACUTE ONLY): 1130 SLP Time Calculation (min) (ACUTE ONLY): 20 min  Past Medical History:  Past Medical History  Diagnosis Date  .  Stroke   . Hypertension   . Hyperlipidemia   . Diabetes mellitus without complication   . Fall at home   . Hemiparesis affecting left side as late effect of stroke   . Dysphagia as late effect of cerebrovascular disease   . Acute respiratory failure   . Cerebral edema    Past Surgical History:  Past Surgical History  Procedure Laterality Date  . Gastrostomy tube placement  05-19-15  . Mechanical thrombectomy angioplasty stenet placement     HPI:  Other Pertinent Information: Pt is a 54 yo male with a PMH of  hypertension, dyslipidemia, diabetes mellitus, CVA with left-sided  hemiparesis, dysphasia, and acute respiratory failure. Presented to the ED  from [facility] for further evaluation of right flank pain, symptoms  since 07/20/15. CXR on 07/21/15 indicated aspiration pna. Pt's pain has been  worsening, scheduled for MBS to objectively evaluate swallow. Per pt  report he drank big gulps of Sprite, he currently is drinking thin  liquids, small sips, and a mixed (fine chop/ puree) diet.   No Data Recorded  Assessment / Plan / Recommendation CHL IP CLINICAL IMPRESSIONS 07/24/2015  Therapy Diagnosis Moderate oral phase dysphagia;Mild pharyngeal phase  dysphagia  Clinical Impression Pt presents with moderate oral dysphagia and mild  pharyngeal dysphagia characterized by prolonged oral mastication,  decreased bolus cohesion, and delayed swallow initiation to valleculae and  pyriform sinuses. Upon further review of video, no laryngeal penetration  or aspiration observed with boluses of thin liquids. No laryngeal residue  present after swallow, however when fluoro turned back on, pyriform  residue was present (x1), could be due to oral residue or esophageal  backflow. No esophageal abnormalities were observed with esophageal scan  (Radiologist present). Pt responded to moderate verbal and tactile cues  for small sips and swallow again to clear mild oral residue. SLP  recommends continuation with current diet; fine chop  and thin liquid with  small sips/bites and intermittent second swallow with full supervision to  assist with feeding and cuing for compensations.      CHL IP TREATMENT RECOMMENDATION 07/24/2015  Treatment Recommendations Defer treatment plan to SLP at (Comment);Other  (Comment)     CHL IP DIET RECOMMENDATION 07/24/2015  SLP Diet Recommendations Dysphagia 2 (Fine chop);Thin  Liquid Administration via (None)  Medication Administration Crushed with puree  Compensations Slow rate;Small sips/bites;Clear throat  intermittently;Multiple dry swallows after each bite/sip  Postural Changes and/or Swallow Maneuvers (None)     No flowsheet data found.   No flowsheet data found.   No flowsheet data found.   Pertinent Vitals/Pain none    SLP Swallow Goals No flowsheet data found.  No flowsheet data found.    CHL IP REASON FOR REFERRAL 07/24/2015  Reason for Referral Objectively evaluate swallowing function                        Houston Siren 07/24/2015, 2:37 PM   Orbie Pyo Colvin Caroli.Ed CCC-SLP Pager [phone]       CLINICAL DATA:  Aspiration pneumonia, history of stroke, recent PEG tube removal  EXAM: MODIFIED  BARIUM SWALLOW  TECHNIQUE: Different consistencies of barium were administered orally to the patient by the Speech Pathologist. Imaging of the pharynx was performed in the lateral projection.  FLUOROSCOPY TIME:  Fluoroscopy Time:  2 minutes 24 seconds  Number of Acquired Images:  22  COMPARISON:  None.  FINDINGS: Thin liquid- Premature spill with delayed swallow trigger. No definite penetration or aspiration.  Pure- within normal limits  Cracker-within normal limits  Pure with cracker- within normal limits  Barium tablet - unable to successfully swallow tablet with applesauce.  IMPRESSION: Premature spill with delayed swallow trigger of thin liquid.  No definite penetration or aspiration.  Please refer to the Speech Pathologists report for complete details and recommendations.   Electronically Signed   By: Julian Hy M.D.   On: 07/24/2015 13:51     Assessment/Plan   ICD-9-CM ICD-10-CM   1. HCAP (healthcare-associated pneumonia) 34 J18.9    +/- aspiration component  2. Essential hypertension, malignant - BP continues to fluctuate 401.0 I10   3. Hemiparesis affecting left side as late effect of stroke 438.20 I69.854   4. Type II diabetes mellitus with neurological manifestations - still has hyperglycemia 250.60 E11.40   5. Dyslipidemia - stable 272.4 E78.5   6. Dysphagia as late effect of stroke 438.82 I69.391   7. Depression due to stroke - stable 311 F32.9    434.91 I63.50    --cont current meds as ordered  --complete levaquin  --cont PT/OT/ST as ordered  --cont nutritional supplements  --GOAL: short term rehab and d/c home when medically appropriate. Communicated with pt and nursing.  --will follow  Monica S. Perlie Gold  [facility] [address] [place], [place] [address] [phone] Cell (Monday-Friday 8 AM - 5 PM) [phone] After 5 PM and follow prompts

## 2015-08-08 ENCOUNTER — Emergency Department (HOSPITAL_COMMUNITY): Payer: Medicare Other

## 2015-08-08 ENCOUNTER — Encounter (HOSPITAL_COMMUNITY): Payer: Self-pay | Admitting: General Practice

## 2015-08-08 ENCOUNTER — Inpatient Hospital Stay (HOSPITAL_COMMUNITY)
Admission: EM | Admit: 2015-08-08 | Discharge: 2015-08-14 | DRG: 377 | Disposition: A | Discharge status: Skilled Nursing Facility | Payer: Medicare Other | Attending: Family Medicine | Admitting: Family Medicine

## 2015-08-08 DIAGNOSIS — K2960 Other gastritis without bleeding: Secondary | ICD-10-CM | POA: Diagnosis present

## 2015-08-08 DIAGNOSIS — K2971 Gastritis, unspecified, with bleeding: Secondary | ICD-10-CM | POA: Diagnosis not present

## 2015-08-08 DIAGNOSIS — D62 Acute posthemorrhagic anemia: Secondary | ICD-10-CM

## 2015-08-08 DIAGNOSIS — R1311 Dysphagia, oral phase: Secondary | ICD-10-CM | POA: Diagnosis present

## 2015-08-08 DIAGNOSIS — R1313 Dysphagia, pharyngeal phase: Secondary | ICD-10-CM | POA: Diagnosis present

## 2015-08-08 DIAGNOSIS — J690 Pneumonitis due to inhalation of food and vomit: Secondary | ICD-10-CM | POA: Diagnosis present

## 2015-08-08 DIAGNOSIS — F17290 Nicotine dependence, other tobacco product, uncomplicated: Secondary | ICD-10-CM | POA: Diagnosis not present

## 2015-08-08 DIAGNOSIS — I69154 Hemiplegia and hemiparesis following nontraumatic intracerebral hemorrhage affecting left non-dominant side: Secondary | ICD-10-CM | POA: Diagnosis not present

## 2015-08-08 DIAGNOSIS — K922 Gastrointestinal hemorrhage, unspecified: Secondary | ICD-10-CM | POA: Diagnosis not present

## 2015-08-08 DIAGNOSIS — F329 Major depressive disorder, single episode, unspecified: Secondary | ICD-10-CM | POA: Diagnosis present

## 2015-08-08 DIAGNOSIS — Z87891 Personal history of nicotine dependence: Secondary | ICD-10-CM

## 2015-08-08 DIAGNOSIS — R079 Chest pain, unspecified: Secondary | ICD-10-CM | POA: Diagnosis not present

## 2015-08-08 DIAGNOSIS — E785 Hyperlipidemia, unspecified: Secondary | ICD-10-CM | POA: Diagnosis present

## 2015-08-08 DIAGNOSIS — E1122 Type 2 diabetes mellitus with diabetic chronic kidney disease: Secondary | ICD-10-CM | POA: Diagnosis present

## 2015-08-08 DIAGNOSIS — Z7902 Long term (current) use of antithrombotics/antiplatelets: Secondary | ICD-10-CM | POA: Diagnosis not present

## 2015-08-08 DIAGNOSIS — Z23 Encounter for immunization: Secondary | ICD-10-CM | POA: Diagnosis not present

## 2015-08-08 DIAGNOSIS — K2980 Duodenitis without bleeding: Secondary | ICD-10-CM | POA: Diagnosis present

## 2015-08-08 DIAGNOSIS — K921 Melena: Secondary | ICD-10-CM | POA: Insufficient documentation

## 2015-08-08 DIAGNOSIS — M6281 Muscle weakness (generalized): Secondary | ICD-10-CM | POA: Diagnosis not present

## 2015-08-08 DIAGNOSIS — N184 Chronic kidney disease, stage 4 (severe): Secondary | ICD-10-CM | POA: Diagnosis present

## 2015-08-08 DIAGNOSIS — I129 Hypertensive chronic kidney disease with stage 1 through stage 4 chronic kidney disease, or unspecified chronic kidney disease: Secondary | ICD-10-CM | POA: Diagnosis present

## 2015-08-08 DIAGNOSIS — I69391 Dysphagia following cerebral infarction: Secondary | ICD-10-CM

## 2015-08-08 DIAGNOSIS — I63511 Cerebral infarction due to unspecified occlusion or stenosis of right middle cerebral artery: Secondary | ICD-10-CM | POA: Diagnosis not present

## 2015-08-08 DIAGNOSIS — I63411 Cerebral infarction due to embolism of right middle cerebral artery: Secondary | ICD-10-CM | POA: Diagnosis not present

## 2015-08-08 DIAGNOSIS — F339 Major depressive disorder, recurrent, unspecified: Secondary | ICD-10-CM | POA: Diagnosis not present

## 2015-08-08 DIAGNOSIS — I69354 Hemiplegia and hemiparesis following cerebral infarction affecting left non-dominant side: Secondary | ICD-10-CM

## 2015-08-08 DIAGNOSIS — I639 Cerebral infarction, unspecified: Secondary | ICD-10-CM | POA: Diagnosis not present

## 2015-08-08 DIAGNOSIS — Z9981 Dependence on supplemental oxygen: Secondary | ICD-10-CM | POA: Diagnosis not present

## 2015-08-08 DIAGNOSIS — I67848 Other cerebrovascular vasospasm and vasoconstriction: Secondary | ICD-10-CM | POA: Diagnosis not present

## 2015-08-08 DIAGNOSIS — Z7982 Long term (current) use of aspirin: Secondary | ICD-10-CM | POA: Diagnosis not present

## 2015-08-08 DIAGNOSIS — R4182 Altered mental status, unspecified: Secondary | ICD-10-CM | POA: Diagnosis not present

## 2015-08-08 DIAGNOSIS — E1149 Type 2 diabetes mellitus with other diabetic neurological complication: Secondary | ICD-10-CM | POA: Diagnosis present

## 2015-08-08 DIAGNOSIS — J168 Pneumonia due to other specified infectious organisms: Secondary | ICD-10-CM | POA: Diagnosis not present

## 2015-08-08 DIAGNOSIS — I6930 Unspecified sequelae of cerebral infarction: Secondary | ICD-10-CM | POA: Insufficient documentation

## 2015-08-08 DIAGNOSIS — E782 Mixed hyperlipidemia: Secondary | ICD-10-CM | POA: Diagnosis not present

## 2015-08-08 DIAGNOSIS — E876 Hypokalemia: Secondary | ICD-10-CM | POA: Diagnosis not present

## 2015-08-08 DIAGNOSIS — I4581 Long QT syndrome: Secondary | ICD-10-CM | POA: Diagnosis not present

## 2015-08-08 DIAGNOSIS — E119 Type 2 diabetes mellitus without complications: Secondary | ICD-10-CM | POA: Diagnosis not present

## 2015-08-08 DIAGNOSIS — I10 Essential (primary) hypertension: Secondary | ICD-10-CM | POA: Diagnosis not present

## 2015-08-08 DIAGNOSIS — R1310 Dysphagia, unspecified: Secondary | ICD-10-CM | POA: Diagnosis not present

## 2015-08-08 LAB — CBC
HCT: 15.6 % — ABNORMAL LOW (ref 39.0–52.0)
HEMATOCRIT: 18.9 % — AB (ref 39.0–52.0)
Hemoglobin: 5 g/dL — CL (ref 13.0–17.0)
Hemoglobin: 5.9 g/dL — CL (ref 13.0–17.0)
MCH: 27.5 pg (ref 26.0–34.0)
MCH: 27.1 pg (ref 26.0–34.0)
MCHC: 32.1 g/dL (ref 30.0–36.0)
MCHC: 31.2 g/dL (ref 30.0–36.0)
MCV: 85.7 fL (ref 78.0–100.0)
MCV: 86.7 fL (ref 78.0–100.0)
PLATELETS: 368 10*3/uL (ref 150–400)
Platelets: 349 10*3/uL (ref 150–400)
RBC: 1.82 MIL/uL — ABNORMAL LOW (ref 4.22–5.81)
RBC: 2.18 MIL/uL — ABNORMAL LOW (ref 4.22–5.81)
RDW: 16.7 % — AB (ref 11.5–15.5)
RDW: 16 % — AB (ref 11.5–15.5)
WBC: 13.9 10*3/uL — AB (ref 4.0–10.5)
WBC: 13 10*3/uL — ABNORMAL HIGH (ref 4.0–10.5)

## 2015-08-08 LAB — URINALYSIS, ROUTINE W REFLEX MICROSCOPIC
BILIRUBIN URINE: NEGATIVE
Glucose, UA: NEGATIVE mg/dL
HGB URINE DIPSTICK: NEGATIVE
Ketones, ur: NEGATIVE mg/dL
Leukocytes, UA: NEGATIVE
Nitrite: NEGATIVE
PROTEIN: NEGATIVE mg/dL
Specific Gravity, Urine: 1.018 (ref 1.005–1.030)
UROBILINOGEN UA: 1 mg/dL (ref 0.0–1.0)
pH: 5 (ref 5.0–8.0)

## 2015-08-08 LAB — COMPREHENSIVE METABOLIC PANEL
ALK PHOS: 144 U/L — AB (ref 38–126)
ALT: 33 U/L (ref 17–63)
AST: 31 U/L (ref 15–41)
Albumin: 2.1 g/dL — ABNORMAL LOW (ref 3.5–5.0)
Anion gap: 8 (ref 5–15)
BILIRUBIN TOTAL: 0.6 mg/dL (ref 0.3–1.2)
BUN: 43 mg/dL — AB (ref 6–20)
CALCIUM: 8.5 mg/dL — AB (ref 8.9–10.3)
CO2: 25 mmol/L (ref 22–32)
CREATININE: 1.11 mg/dL (ref 0.61–1.24)
Chloride: 107 mmol/L (ref 101–111)
GFR calc Af Amer: >60 mL/min (ref >60)
Glucose, Bld: 126 mg/dL — ABNORMAL HIGH (ref 65–99)
Potassium: 3.6 mmol/L (ref 3.5–5.1)
Sodium: 140 mmol/L (ref 135–145)
TOTAL PROTEIN: 6.6 g/dL (ref 6.5–8.1)

## 2015-08-08 LAB — PROTIME-INR
INR: 1.28 (ref 0.00–1.49)
Prothrombin Time: 16.1 seconds — ABNORMAL HIGH (ref 11.6–15.2)

## 2015-08-08 LAB — I-STAT CG4 LACTIC ACID, ED
LACTIC ACID, VENOUS: 0.92 mmol/L (ref 0.5–2.0)
LACTIC ACID, VENOUS: 1.12 mmol/L (ref 0.5–2.0)

## 2015-08-08 LAB — CBG MONITORING, ED: Glucose-Capillary: 115 mg/dL — ABNORMAL HIGH (ref 65–99)

## 2015-08-08 LAB — ABO/RH: ABO/RH(D): A POS

## 2015-08-08 LAB — I-STAT TROPONIN, ED: TROPONIN I, POC: 0 ng/mL (ref 0.00–0.08)

## 2015-08-08 LAB — AMMONIA: Ammonia: 23 umol/L (ref 9–35)

## 2015-08-08 LAB — PREPARE RBC (CROSSMATCH)

## 2015-08-08 LAB — POC OCCULT BLOOD, ED: FECAL OCCULT BLD: POSITIVE — AB

## 2015-08-08 LAB — BRAIN NATRIURETIC PEPTIDE: B Natriuretic Peptide: 35.5 pg/mL (ref 0.0–100.0)

## 2015-08-08 MED ORDER — PANTOPRAZOLE SODIUM 40 MG IV SOLR
40.0000 mg | Freq: Once | INTRAVENOUS | Status: AC
  Administered 2015-08-08: 40 mg via INTRAVENOUS
  Filled 2015-08-08: qty 40

## 2015-08-08 MED ORDER — ACETAMINOPHEN 10 MG/ML IV SOLN
1000.0000 mg | Freq: Once | INTRAVENOUS | Status: AC
  Administered 2015-08-08: 1000 mg via INTRAVENOUS
  Filled 2015-08-08: qty 100

## 2015-08-08 MED ORDER — PANTOPRAZOLE SODIUM 40 MG IV SOLR
40.0000 mg | Freq: Two times a day (BID) | INTRAVENOUS | Status: DC
  Administered 2015-08-08 – 2015-08-11 (×7): 40 mg via INTRAVENOUS
  Filled 2015-08-08 (×10): qty 40

## 2015-08-08 MED ORDER — SODIUM CHLORIDE 0.9 % IV SOLN
10.0000 mL/h | Freq: Once | INTRAVENOUS | Status: AC
  Administered 2015-08-08: 10 mL/h via INTRAVENOUS

## 2015-08-08 MED ORDER — ACETAMINOPHEN 325 MG PO TABS
650.0000 mg | ORAL_TABLET | Freq: Once | ORAL | Status: DC
  Filled 2015-08-08: qty 2

## 2015-08-08 NOTE — ED Provider Notes (Signed)
CSN: [id number]     Arrival date & time 08/08/15  1357 History   First MD Initiated Contact with Patient 08/08/15 1456     Chief Complaint  Patient presents with  . Chest Pain  . Altered Mental Status     (Consider location/radiation/quality/duration/timing/severity/associated sxs/prior Treatment) HPI   Patient is a 54 year old male presenting from [facility] nursing home following a recent hospital stay at [facility] for pneumonia H CAP. He has history of a KI, stroke hypertension hyperlipidemia diabetes hemiparesis and dysphasia and cerebral edema.  Patient has a history of chronic chest pain. On recent admission here is found to have age And treated at that time he was found to have elevated liver enzymesgiven Avelox and intravenous fluids. Discharged on 08/05/2015. This was 2 days ago. Patient found to have increasing somnolence at nursing home and brought here to emergency room.  L5 caveat unable to understand his speech.  Past Medical History  Diagnosis Date  . Stroke   . Hypertension   . Hyperlipidemia   . Diabetes mellitus without complication   . Fall at home   . Hemiparesis affecting left side as late effect of stroke   . Dysphagia as late effect of cerebrovascular disease   . Acute respiratory failure   . Cerebral edema    Past Surgical History  Procedure Laterality Date  . Gastrostomy tube placement  05-19-15  . Mechanical thrombectomy angioplasty stenet placement     Family History  Problem Relation Age of Onset  . Family history unknown: Yes   Social History  Substance Use Topics  . Smoking status: Former Smoker    Types: Cigarettes  . Smokeless tobacco: None  . Alcohol Use: None    Review of Systems  Unable to perform ROS: Mental status change      Allergies  Review of patient's allergies indicates no known allergies.  Home Medications   Prior to Admission medications   Medication Sig Start Date End Date Taking? Authorizing  Provider  acetaminophen (TYLENOL) 325 MG tablet 325-650 mg by Gastrostomy Tube route every 8 (eight) hours as needed for mild pain.    Yes Historical Provider, MD  amantadine (SYMMETREL) 100 MG capsule 200 mg by Gastrostomy Tube route 2 (two) times daily.    Yes Historical Provider, MD  amLODipine (NORVASC) 10 MG tablet 10 mg by Gastrostomy Tube route daily.    Yes Historical Provider, MD  aspirin 81 MG tablet Take 81 mg by mouth daily.   Yes Historical Provider, MD  atorvastatin (LIPITOR) 80 MG tablet Take 80 mg by mouth daily at 6 PM.   Yes Historical Provider, MD  clopidogrel (PLAVIX) 75 MG tablet 75 mg by Gastrostomy Tube route daily.    Yes Historical Provider, MD  Fenofibrate 40 MG TABS 1 tablet by Gastrostomy Tube route every evening.    Yes Historical Provider, MD  FLUoxetine (PROZAC) 20 MG capsule 20 mg by Gastrostomy Tube route daily.    Yes Historical Provider, MD  hydrALAZINE (APRESOLINE) 10 MG tablet Take 10 mg by mouth every 8 (eight) hours.    Yes Historical Provider, MD  hydrochlorothiazide (HYDRODIURIL) 25 MG tablet 25 mg by Gastrostomy Tube route daily.    Yes Historical Provider, MD  HYDROcodone-acetaminophen (NORCO/VICODIN) 5-325 MG per tablet Take one tablet by mouth every 6 hours as needed for pain. Not to exceed 3000mg  of APAP from all sources/24h 07/23/15  Yes Gildardo Cranker, DO  lisinopril (PRINIVIL,ZESTRIL) 40 MG tablet Take 40 mg by  mouth daily.   Yes Historical Provider, MD  minoxidil (LONITEN) 2.5 MG tablet 5 mg by Gastrostomy Tube route 2 (two) times daily.   Yes Historical Provider, MD  Multiple Vitamins tablet Take 1 tablet by mouth daily.   Yes Historical Provider, MD  oxycodone (OXY-IR) 5 MG capsule Take 5 mg by mouth every 4 (four) hours as needed for pain.   Yes Historical Provider, MD  sennosides-docusate sodium (SENOKOT-S) 8.6-50 MG tablet Take 1 tablet by mouth daily.    Yes Historical Provider, MD   BP 142/68 mmHg  Pulse 96  Temp(Src) 98.1 F (36.7 C)  (Rectal)  Resp 23  Ht 6\' 2"  (1.88 m)  Wt 230 lb (104.327 kg)  BMI 29.52 kg/m2  SpO2 97% Physical Exam  Constitutional: He is oriented to person, place, and time. He appears well-nourished.  HENT:  Head: Normocephalic.  Mouth/Throat: Oropharynx is clear and moist.  Eyes: Conjunctivae are normal.  Neck: No tracheal deviation present.  Cardiovascular: Normal rate.   Pulmonary/Chest: Effort normal. No stridor. No respiratory distress. He has no wheezes.  Abdominal: Soft. There is no tenderness. There is no guarding.  Musculoskeletal: Normal range of motion. He exhibits no edema.  Bilateral edema in both hands up her extremities feet and lower extremity.  Neurological: He is oriented to person, place, and time. No cranial nerve deficit.  Hemiparesis on the left. Very minimal movement globally.  Slurred speech  Skin: Skin is warm and dry. No rash noted. He is not diaphoretic.  Nursing note and vitals reviewed.   ED Course  Procedures (including critical care time) Labs Review Labs Reviewed  COMPREHENSIVE METABOLIC PANEL - Abnormal; Notable for the following:    Glucose, Bld 126 (*)    BUN 43 (*)    Calcium 8.5 (*)    Albumin 2.1 (*)    Alkaline Phosphatase 144 (*)    All other components within normal limits  CBC - Abnormal; Notable for the following:    WBC 13.9 (*)    RBC 1.82 (*)    Hemoglobin 5.0 (*)    HCT 15.6 (*)    RDW 16.7 (*)    All other components within normal limits  CBC - Abnormal; Notable for the following:    WBC 13.0 (*)    RBC 2.18 (*)    Hemoglobin 5.9 (*)    HCT 18.9 (*)    RDW 16.0 (*)    All other components within normal limits  PROTIME-INR - Abnormal; Notable for the following:    Prothrombin Time 16.1 (*)    All other components within normal limits  CBG MONITORING, ED - Abnormal; Notable for the following:    Glucose-Capillary 115 (*)    All other components within normal limits  POC OCCULT BLOOD, ED - Abnormal; Notable for the following:     Fecal Occult Bld POSITIVE (*)    All other components within normal limits  URINE CULTURE  URINALYSIS, ROUTINE W REFLEX MICROSCOPIC (NOT AT [facility])  BRAIN NATRIURETIC PEPTIDE  AMMONIA  OCCULT BLOOD X 1 CARD TO LAB, STOOL  CBC  CBC  I-STAT CG4 LACTIC ACID, ED  I-STAT TROPOININ, ED  I-STAT CG4 LACTIC ACID, ED  I-STAT CG4 LACTIC ACID, ED  TYPE AND SCREEN  PREPARE RBC (CROSSMATCH)  ABO/RH    Imaging Review Ct Head Wo Contrast  08/08/2015   CLINICAL DATA:  Increased confusion, altered mental status, more lethargic, history of prior stroke with LEFT hemi para cysts and facial droop,  dysphagia, hypertension, diabetes mellitus  EXAM: CT HEAD WITHOUT CONTRAST  TECHNIQUE: Contiguous axial images were obtained from the base of the skull through the vertex without intravenous contrast.  COMPARISON:  None  FINDINGS: Generalized atrophy.  Minimal ex vacuo dilatation of the RIGHT lateral ventricle versus LEFT.  No midline shift or mass effect.  Low-attenuation identified in RIGHT hemisphere corresponding to a prior RIGHT MCA infarct.  No intracranial hemorrhage, mass lesion, or evidence of acute infarction.  Underlying small vessel chronic ischemic changes of deep cerebral white matter.  No extra-axial fluid collections.  Opacified sphenoid sinus.  No acute osseous findings.  RIGHT MCA stent identified.  IMPRESSION: Prior RIGHT MCA territory infarct.  Small vessel chronic ischemic changes of deep cerebral white matter.  No acute intracranial abnormalities.  Sphenoid sinus opacification.   Electronically Signed   By: Lavonia Dana M.D.   On: 08/08/2015 16:40   Dg Chest Port 1 View  08/08/2015   CLINICAL DATA:  Acute onset central chest pain today.  EXAM: PORTABLE CHEST 1 VIEW  COMPARISON:  07/23/2015  FINDINGS: Heart size remains normal. Right lower lobe airspace disease is again seen, although there improved aeration of the right lung base. This remains suspicious for pneumonia.  IMPRESSION: Persistent  asymmetric right lower lung airspace opacity, suspicious for pneumonia.   Electronically Signed   By: Earle Gell M.D.   On: 08/08/2015 15:39   I have personally reviewed and evaluated these images and lab results as part of my medical decision-making.   EKG Interpretation   Date/Time:  Friday August 08 2015 14:06:46 EDT Ventricular Rate:  85 PR Interval:  123 QRS Duration: 92 QT Interval:  451 QTC Calculation: 536 R Axis:   20 Text Interpretation:  Sinus rhythm Borderline repolarization abnormality  Prolonged QT interval Baseline wander in lead(s) III aVF No significant  change since last tracing Confirmed by ZACKOWSKI  MD, SCOTT [phone]) on  08/08/2015 2:14:02 PM      MDM   Final diagnoses:  Chest pain  Gastrointestinal hemorrhage, unspecified gastritis, unspecified gastrointestinal hemorrhage type   patient is a 33 her old male with couple. Past medical history including CVA with left hemiparesis and difficulty communicating. Patient brought in after recent hospital stay at [facility] for age. He had 2 days home at his SNF and then found to have a fever and confusion and brought back to the emergency room.  Patient has a nonfocal exam. He is unable to really telling what is bothering him. Patient's febrile rectally.  We will examine for sepsis, EDC Chem-7, get another chest x-ray.  Patient had hemoglobin of 5. Rectal exam showed black tarry stool. GI consult. Pantoprazole given.  GI wanted to hold off on scoping until Plavix worn off.  Discussed with critical care they think stepdown is appropriate.   Patient is admitted.  Courteney Julio Alm, MD 08/08/15 [phone]

## 2015-08-08 NOTE — Progress Notes (Signed)
Called down to ED for update on patient as he has not yet been moved up to [facility]. His nurse said there was limited room for a stepdown bed but one has opened up and he would be moved up shortly. He was able to relay current VS to me and assure no negative changes in status. I appreciate the help and communication.   Elberta Leatherwood, MD,MS,  PGY2 08/08/2015 11:38 PM

## 2015-08-08 NOTE — Consult Note (Signed)
[facility] Consult: 4:26 PM 08/08/2015     Referring [place]: Dr Thomasene Lot  Primary Care Physician:  Gildardo Cranker, DO Primary Gastroenterologist:  unassigned     Reason for Consultation:  GI bleed   HPI: [name] [name] is a 54 y.o. male.  [place] CVA 2011 abd 04/2015, significant neurologic impairment .   S/p G tube.  SNF resident. On Plavix/81ASA.  CKD stage 4. Obesity Hx elevated LFTs at [facility] in 07/2015 during admission with aspiration PNA, AKI, RUQ pain. T bili to 1.9, alk phos to 218, AST to 167, ALT to 82  Hep A, B and C serologies negative 07/31/15.  9/202016 ultrasound with normal GB, 2 mm CBD, normal liver and pancreas. CT scan 9/19 with nonspecific prominent upper abdominal mesenteric and retroperitoneal lymph nodes.   Aspiration event and RLL PNA on SNF xray 9/10.  Seen in ED 9/14 for right flank pain. Alk phos was 130.  tbil and transaminases normal. No GI imaging.  rx'd with narcotics and to continue his Levaquin.    Patient was seen at [facility] earlier today for follow-up of chronic chest pain. He will return to his dyspnea. Brought to ED at noon today due to lethargy. Temp is 100.1. Pulse in the 80s to 90s. Systolic blood pressures in the 1 teens to 120s. Room air saturation 100%. Hgb is 5.  Was 9.1 on 07/31/15,  10.2 on 9/14.  BUN/creatinine improved.  No pt/INR.  Platelets 9/14 were 141, 368 today.    Pt not on PPI    Past Medical History  Diagnosis Date  . Stroke   . Hypertension   . Hyperlipidemia   . Diabetes mellitus without complication   . Fall at home   . Hemiparesis affecting left side as late effect of stroke   . Dysphagia as late effect of cerebrovascular disease   . Acute respiratory failure   . Cerebral edema     Past Surgical History  Procedure Laterality Date  . Gastrostomy  tube placement  05-19-15  . Mechanical thrombectomy angioplasty stenet placement      Prior to Admission medications   Medication Sig Start Date End Date Taking? Authorizing Aelyn Stanaland  acetaminophen (TYLENOL) 325 MG tablet 325-650 mg by Gastrostomy Tube route every 8 (eight) hours as needed for mild pain.    Yes Historical Josha Weekley, MD  amantadine (SYMMETREL) 100 MG capsule 200 mg by Gastrostomy Tube route 2 (two) times daily.    Yes Historical Cassadi Purdie, MD  amLODipine (NORVASC) 10 MG tablet 10 mg by Gastrostomy Tube route daily.    Yes Historical Jaelon Gatley, MD  aspirin 81 MG tablet Take 81 mg by mouth daily.   Yes Historical Payam Gribble, MD  atorvastatin (LIPITOR) 80 MG tablet Take 80 mg by mouth daily at 6 PM.   Yes Historical Roshad Hack, MD  clopidogrel (PLAVIX) 75 MG tablet 75 mg by Gastrostomy Tube route daily.    Yes Historical Mayah Urquidi, MD  Fenofibrate 40 MG TABS 1 tablet by Gastrostomy Tube route every evening.    Yes Historical Fahd Galea, MD  FLUoxetine (  PROZAC) 20 MG capsule 20 mg by Gastrostomy Tube route daily.    Yes Historical , MD  hydrALAZINE (APRESOLINE) 10 MG tablet Take 10 mg by mouth every 8 (eight) hours.    Yes Historical , MD  hydrochlorothiazide (HYDRODIURIL) 25 MG tablet 25 mg by Gastrostomy Tube route daily.    Yes Historical , MD  HYDROcodone-acetaminophen (NORCO/VICODIN) 5-325 MG per tablet Take one tablet by mouth every 6 hours as needed for pain. Not to exceed 3036m of APAP from all sources/24h 07/23/15  Yes MGildardo Cranker DO  lisinopril (PRINIVIL,ZESTRIL) 40 MG tablet Take 40 mg by mouth daily.   Yes Historical , MD  minoxidil (LONITEN) 2.5 MG tablet 5 mg by Gastrostomy Tube route 2 (two) times daily.   Yes Historical , MD  Multiple Vitamins tablet Take 1 tablet by mouth daily.   Yes Historical , MD  oxycodone (OXY-IR) 5 MG capsule Take 5 mg by mouth every 4 (four) hours as needed for pain.   Yes Historical , MD    sennosides-docusate sodium (SENOKOT-S) 8.6-50 MG tablet Take 1 tablet by mouth daily.    Yes Historical , MD    Scheduled Meds: . acetaminophen  650 mg Oral Once   Infusions: . acetaminophen     PRN Meds:    Allergies as of 08/08/2015  . (No Known Allergies)    Family History  Problem Relation Age of Onset  . Family history unknown: Yes    Social History   Social History  . Marital Status: Single    Spouse Name: N/A  . Number of Children: N/A  . Years of Education: N/A   Occupational History  . Not on file.   Social History Main Topics  . Smoking status: Former Smoker    Types: Cigarettes  . Smokeless tobacco: Not on file  . Alcohol Use: Not on file  . Drug Use: Not on file  . Sexual Activity: Not on file   Other Topics Concern  . Not on file   Social History Narrative    REVIEW OF SYSTEMS: Constitutional:  Lethargy. ENT:  No nose bleeds Pulm:  Denies shortness of breath and cough. CV:  No palpitations, no LE edema.  GU:  No hematuria, no frequency GI:  Denies abdominal pain. Does admit to some nausea but no emesis. Heme:  Per hpi   Transfusions: Not aware of any previous transfusions Neuro:  No headaches, no peripheral tingling or numbness Derm:  No itching, no rash or sores.  Endocrine:  No sweats or chills.  No polyuria or dysuria Immunization:  Nothing in the record as to vaccinations Travel:  None beyond local counties in last few months.    PHYSICAL EXAM: Vital signs in last 24 hours: Filed Vitals:   08/08/15 1545  BP: 112/66  Pulse: 88  Temp:   Resp: 19   Wt Readings from Last 3 Encounters:  08/08/15 230 lb (104.327 kg)  08/05/15 230 lb (104.327 kg)  06/02/15 259 lb (117.482 kg)   General: Pale AAM. Lethargic but easily arousable. Significant dysarthria but nods appropriately to questions. Head:  No facial swelling. Left facial droop  Eyes:  No conjunctival pallor, no scleral icterus Ears:  Not hard of hearing  Nose:   No congestion, no discharge Mouth:  Clear, moist. No blood in the mouth. Neck:  No JVD, no TMG, no masses Lungs:  Diminished breath sounds bilaterally. No labored breathing. No cough. Heart: RRR. No chest wall tenderness. No MRG. S1/S2 audible.  Abdomen:  Soft, slightly obese. Previous G-tube site in left upper quadrant is intact and slightly red. No HSM. No bruits. Not tender or distended. Bowel sounds active.   Rectal: Did not repeat the rectal exam done earlier in the ED. It was black, tarry and FOBT positive per report from the RN.   Musc/Skeltl: No joint contractures Extremities:  Nonpitting edema in the left upper extremity as well as left lower extremity.  Neurologic:  Appropriate, follows commands. Unable to move left side. Attempts to speak but significant dysarthria makes speak just barely intelligible Skin:  Pale. No telangiectasia. No sores or rashes Nodes:  No cervical adenopathy   Psych:  Resting quietly, calm, not at all agitated or anxious  Intake/Output from previous day:   Intake/Output this shift:    LAB RESULTS:  Recent Labs  08/08/15 1448  WBC 13.9*  HGB 5.0*  HCT 15.6*  PLT 368   BMET Lab Results  Component Value Date   NA 140 08/08/2015   NA 137 07/23/2015   K 3.6 08/08/2015   K 3.7 07/23/2015   CL 107 08/08/2015   CL 104 07/23/2015   CO2 25 08/08/2015   CO2 19* 07/23/2015   GLUCOSE 126* 08/08/2015   GLUCOSE 130* 07/23/2015   BUN 43* 08/08/2015   BUN 42* 07/23/2015   CREATININE 1.11 08/08/2015   CREATININE 3.11* 07/23/2015   CALCIUM 8.5* 08/08/2015   CALCIUM 9.3 07/23/2015   LFT  Recent Labs  08/08/15 1448  PROT 6.6  ALBUMIN 2.1*  AST 31  ALT 33  ALKPHOS 144*  BILITOT 0.6   PT/INR No results found for: INR, PROTIME Hepatitis Panel No results for input(s): HEPBSAG, HCVAB, HEPAIGM, HEPBIGM in the last 72 hours. C-Diff No components found for: CDIFF Lipase  No results found for: LIPASE  Drugs of Abuse  No results found for:  LABOPIA, COCAINSCRNUR, LABBENZ, AMPHETMU, THCU, LABBARB   RADIOLOGY STUDIES: Dg Chest Port 1 View  08/08/2015   CLINICAL DATA:  Acute onset central chest pain today.  EXAM: PORTABLE CHEST 1 VIEW  COMPARISON:  07/23/2015  FINDINGS: Heart size remains normal. Right lower lobe airspace disease is again seen, although there improved aeration of the right lung base. This remains suspicious for pneumonia.  IMPRESSION: Persistent asymmetric right lower lung airspace opacity, suspicious for pneumonia.   Electronically Signed   By: Earle Gell M.D.   On: 08/08/2015 15:39    ENDOSCOPIC STUDIES: Patient and daughter. He has never had upper endoscopy nor colonoscopy  IMPRESSION:   *  Acute blood loss anemia. FOBT positive black stool. Patient on chronic Plavix/ASA. Rule out peptic ulcer disease, rule out AVMs.  *  History elevated LFTs. Negative ultrasound, negative CT, negative hepatitis serologies during 07/2015 admission at [facility].  *  History of CVAs, latest was 04/2015.  Significant neurologic impairment but appears cognitively intact  *  Dysphagia. Feeding tube placed following the stroke but was removed in 06/2015.    *  History of recurrent aspiration pneumonias. Right lower lobe pneumonia attributed to aspiration and treated with Levaquin beginning around 9/10. Today's chest film shows persistent though improved right lower lobe pneumonia.    PLAN:     *  Hold aspirin and Plavix. Start Protonix. Should be okay with coverage of twice a day, IV formulation.  *  Check protime/inr.   *  Spoke with the daughter as well as the patient about upper endoscopy. See how he does over the next 24 hours but it  is best to delay endoscopy if possible given the actively circulating Plavix.  Currently hemodynamically stable.  *  Transfuse PRBCs, so far 1 unit has been ordered. He is likely to need at least one more.  Ordered every 8 hour CBCs.    *  During admission he is going to need another swallowing  assessment. Given his recurrent aspiration events, he may need to have another gastric feeding tube placed depending on results of the swallow study. For now we'll keep him NPO   Azucena Freed  08/08/2015, 4:26 PM Pager: [phone]

## 2015-08-08 NOTE — H&P (Signed)
[facility] Admission History and Physical Service Pager: [phone]  Patient name: [name] [name] Medical record number: [id number] Date of birth: 1961-05-18 Age: 54 y.o. Gender: male  Primary Care Provider: Gildardo Cranker, DO Consultants: Gastroenterology Code Status: Full  Chief Complaint: GI bleed  Assessment and Plan: BENTLEIGH STANKUS is a 54 y.o. male presenting with GI bleed. PMH is significant for CVA in 2016 with residual L hemiplegia (on ASA and Plavix), HTN, HLD.  1. GI Bleed: Pt noted to have black, tarry, hemoccult + stools in the ED. Hgb was 5.0, down from 10.2 two weeks ago. Likely upper GI bleed due to presence of melena, however h/o constipation makes diverticular bleed a possibility as well. Pt is currently hemodynamically stable, but symptomatic anemia is present. - s/p 1U pRBC in the ED. Will give at least 1 more unit. - Follow-up post-transfusion H/H - GI has been consulted, appreciate recommendations.   - Believes his GI bleed is exacerbated by antiplatelet therapy.   - Recommend treating with high dose Protonix bid.  - They think he will need at least 2 units pRBCs. We have prepared a total of 3  - Recommend q8hrs CBCs.  - Will wait for Plavix to run out of his system before doing upper endoscopy. - If he has severe bleeding, will call GI overnight for emergent endoscopy. - Protonix 40mg  bid - Admit to stepdown unit - Cardiac monitoring - am BMP, PT/INR, and CBC Q8hr  2. Hx of CVA: Occurred in June 2016. Had a stent placed in the R MCA. Pt with residual L hemiplegia. Has been on ASA and Plavix. Deficits appreciated on PE, these are believed to be at baseline since the CVA. - Will hold ASA and Plavix in the setting of acute GI bleed - SLP to perform swallow evaluation - CT in ED >> no acute process - Curb-sided Neuro about stent. They agreed with holding Plavix and ASA although this scenario is obviously not ideal. Recommended restarting  antiplatelet therapy as soon as safely possible.  3. HCAP: Was just recently being treated for HCAP at [facility]. CXR showing persistent asymmetric R lower lung airspace opacity, suspicious for pneumonia. Was on Levaquin x 10 days, now on a 10 day course of Moxifloxacin. - Will finish course via Levaquin IV 750mg  (ends on 10/2)  4. HTN: BPs 106-123/57-64. - Holding home meds in the setting of acute GI bleed: Hydralazine, HCTZ, Lisinopril  5. HLD: - Continue home med: Lipitor qd, Fenofibrate qd  6. Depression: Pt experienced depression after stroke - Continue home med: Prozac 20mg  qd  FEN/GI: NPO until GI clears him; SLP will be needed for swallow eval, MIVFs at 174ml/hr Prophylaxis: SCDs in the setting of acute GI bleed  Disposition: Admitted to [place], attending Dr. Nori Riis.  History of Present Illness:  [name] [name] is a 54 y.o. male presented from [facility] with altered mental status and sleepiness. In the ED, he was found to have a hemoglobin of 5.0 with black, tarry, hemoccult positive stools on rectal exam. He was transfused 1 unit pRBCs. Per Pt, he came in for a "stroke". He says he has no symptoms and is not confused. He does not know what his bowel movements have looked like recently. He denies abdominal pain, heartburn, chest pain, palpitations, and headaches. He says he is a little short of breath, but is "breathing pretty good right now". Per Pt, he is on 1L O2 at home.  He also states that he felt like he was going to pass out today. He has also had a cough. His chest hurts when he coughs and his back hurts with deep breaths. He says he has no history of heartburn or stomach ulcers. He "takes 6 aspirin a day".   In the ED, he was hemodynamically stable. He was placed on 1.5L O2 by nasal cannula. Troponin 0.00, BNP 35.5, lactic acid 0.92. He was transfused 1U pRBCs. He was seen by GI, who thought that his bleeding was exacerbated by Plavix. They  recommended treating him with high dose therapy and allowing Plavix to run out of his system before doing an upper endoscopy.   Of note, he was just recently treated at [facility] for HCAP, for which his still has 3 more days of treatment. He was discharged home to a SNF for continued rehab. He has a history of CVA in June 2016 with residual L hemiparesis. He was treated with tPA and mechanical thrombectomy. He had a stent placed in his R MCA and was started on ASA and Plavix.  Review Of Systems: Per HPI with the following additions: Normal urination, no fevers, no chills. Otherwise 12 point review of systems was performed and was unremarkable.  Patient Active Problem List   Diagnosis Date Noted  . GI bleed 08/08/2015  . Acute blood loss anemia 08/08/2015  . Acute CVA (cerebrovascular accident) 06/02/2015  . Hemiparesis affecting left side as late effect of stroke 06/02/2015  . Dyslipidemia 06/02/2015  . Type II diabetes mellitus with neurological manifestations 06/02/2015  . Essential hypertension, malignant 06/02/2015  . Chronic constipation 06/02/2015  . Depression due to stroke 06/02/2015  . Dysphagia as late effect of stroke 06/02/2015   Past Medical History: Past Medical History  Diagnosis Date  . Stroke   . Hypertension   . Hyperlipidemia   . Diabetes mellitus without complication   . Fall at home   . Hemiparesis affecting left side as late effect of stroke   . Dysphagia as late effect of cerebrovascular disease   . Acute respiratory failure   . Cerebral edema    Past Surgical History: Past Surgical History  Procedure Laterality Date  . Gastrostomy tube placement  05-19-15  . Mechanical thrombectomy angioplasty stenet placement     Social History: Social History  Substance Use Topics  . Smoking status: Former Smoker    Types: Cigarettes  . Smokeless tobacco: None  . Alcohol Use: None   Additional social history: when not at SNF, lives with Lexine Baton (daughter). States he  smoked 1 ppd x 3 years. Please also refer to relevant sections of EMR.  Family History: Family History  Problem Relation Age of Onset  . Family history unknown: Yes   Allergies and Medications: No Known Allergies No current facility-administered medications on file prior to encounter.   Current Outpatient Prescriptions on File Prior to Encounter  Medication Sig Dispense Refill  . acetaminophen (TYLENOL) 325 MG tablet 325-650 mg by Gastrostomy Tube route every 8 (eight) hours as needed for mild pain.     Marland Kitchen amantadine (SYMMETREL) 100 MG capsule 200 mg by Gastrostomy Tube route 2 (two) times daily.     Marland Kitchen amLODipine (NORVASC) 10 MG tablet 10 mg by Gastrostomy Tube route daily.     Marland Kitchen aspirin 81 MG tablet Take 81 mg by mouth daily.    Marland Kitchen atorvastatin (LIPITOR) 80 MG tablet Take 80 mg by mouth daily at 6 PM.    . clopidogrel (  PLAVIX) 75 MG tablet 75 mg by Gastrostomy Tube route daily.     . Fenofibrate 40 MG TABS 1 tablet by Gastrostomy Tube route every evening.     Marland Kitchen FLUoxetine (PROZAC) 20 MG capsule 20 mg by Gastrostomy Tube route daily.     . hydrALAZINE (APRESOLINE) 10 MG tablet Take 10 mg by mouth every 8 (eight) hours.     . hydrochlorothiazide (HYDRODIURIL) 25 MG tablet 25 mg by Gastrostomy Tube route daily.     Marland Kitchen HYDROcodone-acetaminophen (NORCO/VICODIN) 5-325 MG per tablet Take one tablet by mouth every 6 hours as needed for pain. Not to exceed 3000mg  of APAP from all sources/24h 120 tablet 0  . lisinopril (PRINIVIL,ZESTRIL) 40 MG tablet Take 40 mg by mouth daily.    . Multiple Vitamins tablet Take 1 tablet by mouth daily.    . sennosides-docusate sodium (SENOKOT-S) 8.6-50 MG tablet Take 1 tablet by mouth daily.       Objective: BP 113/64 mmHg  Pulse 79  Temp(Src) 98.1 F (36.7 C) (Rectal)  Resp 18  Ht 6\' 2"  (1.88 m)  Wt 230 lb (104.327 kg)  BMI 29.52 kg/m2  SpO2 100% Exam: General: Lethargic male, laying in bed, answering questions but drowsy HEENT: [place]/AT, EOMI, PERRLA,  conjunctival pallor, dry MM Neck: Supple, FROM Cardiovascular: RRR, no m/r/g Respiratory: CTAB, taking very shallow breaths, [place] in place Abdomen: +BS, soft, non-distended, TTP in the epigastric area; gastric tube scarring noted MSK: LUE is spastic and contracted into flexion, Voluntary muscle use was extremely limited in RUE and RLE, 5/5 muscle strength in LUE and LLE; no clonus present;  no edema in the LEs Skin: No rashes or lesions Neuro: States the date is July 25, 2015. Knows who the President is. Can't remember what he had for breakfast or lunch today. Speech is soft w/ slight slur; left sided facial droop present; difficult to thoroughly assess all CNs 2/2 fatigue/lethargy.  Psych: Appropriate mood and affect  Labs and Imaging: CBC BMET   Recent Labs Lab 08/08/15 1448  WBC 13.9*  HGB 5.0*  HCT 15.6*  PLT 368    Recent Labs Lab 08/08/15 1448  NA 140  K 3.6  CL 107  CO2 25  BUN 43*  CREATININE 1.11  GLUCOSE 126*  CALCIUM 8.5*     ITrop: 0.00 Lactic acid: 0.92 BNP 35.5 UA: negative  CXR: Persistent asymmetric right lower lung airspace opacity, suspicious for pneumonia CT head: Prior R MCA territory infarct. Small vessel chronic ischemic changes of deep cerebral white matter. No acute intracranial abnormalities.  Sela Hua, MD 08/08/2015, 7:01 PM PGY-1, [place] Intern pager: [phone], text pages welcome   Upper Level Addendum:  I have seen and evaluated this patient along with Dr. Brett Albino and reviewed the above note, making necessary revisions in red.   Elberta Leatherwood, MD,MS,  PGY2 08/08/2015 10:56 PM

## 2015-08-08 NOTE — ED Notes (Signed)
Pt presents from [facility]. Pt was seen today at [facility] for a follow up for his chronic chest pain. When pt arrived back at facility staff noticed pt was not his norm. Staff reporting pt is more lethargic. Pt has a left sided facial droop from a previous stroke. Pt is A/O to self, and place.

## 2015-08-09 ENCOUNTER — Inpatient Hospital Stay (HOSPITAL_COMMUNITY): Payer: Medicare Other

## 2015-08-09 DIAGNOSIS — I6930 Unspecified sequelae of cerebral infarction: Secondary | ICD-10-CM

## 2015-08-09 DIAGNOSIS — K921 Melena: Principal | ICD-10-CM

## 2015-08-09 LAB — CBC
HCT: 22.4 % — ABNORMAL LOW (ref 39.0–52.0)
HEMATOCRIT: 17.7 % — AB (ref 39.0–52.0)
HEMATOCRIT: 23.6 % — AB (ref 39.0–52.0)
HEMOGLOBIN: 5.7 g/dL — AB (ref 13.0–17.0)
HEMOGLOBIN: 7.9 g/dL — AB (ref 13.0–17.0)
Hemoglobin: 7.4 g/dL — ABNORMAL LOW (ref 13.0–17.0)
MCH: 27.5 pg (ref 26.0–34.0)
MCH: 28.6 pg (ref 26.0–34.0)
MCH: 28 pg (ref 26.0–34.0)
MCHC: 32.2 g/dL (ref 30.0–36.0)
MCHC: 33.5 g/dL (ref 30.0–36.0)
MCHC: 33 g/dL (ref 30.0–36.0)
MCV: 85.5 fL (ref 78.0–100.0)
MCV: 85.5 fL (ref 78.0–100.0)
MCV: 84.8 fL (ref 78.0–100.0)
PLATELETS: 277 10*3/uL (ref 150–400)
Platelets: 288 10*3/uL (ref 150–400)
Platelets: 269 10*3/uL (ref 150–400)
RBC: 2.07 MIL/uL — ABNORMAL LOW (ref 4.22–5.81)
RBC: 2.76 MIL/uL — AB (ref 4.22–5.81)
RBC: 2.64 MIL/uL — AB (ref 4.22–5.81)
RDW: 15.9 % — AB (ref 11.5–15.5)
RDW: 15.7 % — ABNORMAL HIGH (ref 11.5–15.5)
RDW: 15.9 % — ABNORMAL HIGH (ref 11.5–15.5)
WBC: 12.4 10*3/uL — ABNORMAL HIGH (ref 4.0–10.5)
WBC: 11.2 10*3/uL — AB (ref 4.0–10.5)
WBC: 9.7 10*3/uL (ref 4.0–10.5)

## 2015-08-09 LAB — BASIC METABOLIC PANEL
ANION GAP: 9 (ref 5–15)
BUN: 34 mg/dL — ABNORMAL HIGH (ref 6–20)
CO2: 23 mmol/L (ref 22–32)
Calcium: 8.1 mg/dL — ABNORMAL LOW (ref 8.9–10.3)
Chloride: 109 mmol/L (ref 101–111)
Creatinine, Ser: 0.84 mg/dL (ref 0.61–1.24)
GLUCOSE: 119 mg/dL — AB (ref 65–99)
POTASSIUM: 3.7 mmol/L (ref 3.5–5.1)
Sodium: 141 mmol/L (ref 135–145)

## 2015-08-09 LAB — URINE CULTURE: CULTURE: NO GROWTH

## 2015-08-09 LAB — PROTIME-INR
INR: 1.37 (ref 0.00–1.49)
Prothrombin Time: 17 seconds — ABNORMAL HIGH (ref 11.6–15.2)

## 2015-08-09 LAB — MRSA PCR SCREENING: MRSA BY PCR: POSITIVE — AB

## 2015-08-09 LAB — PREPARE RBC (CROSSMATCH)

## 2015-08-09 LAB — GLUCOSE, CAPILLARY: Glucose-Capillary: 118 mg/dL — ABNORMAL HIGH (ref 65–99)

## 2015-08-09 MED ORDER — SODIUM CHLORIDE 0.9 % IV SOLN
INTRAVENOUS | Status: DC
  Administered 2015-08-09 – 2015-08-10 (×3): via INTRAVENOUS

## 2015-08-09 MED ORDER — SODIUM CHLORIDE 0.9 % IJ SOLN
3.0000 mL | Freq: Two times a day (BID) | INTRAMUSCULAR | Status: DC
  Administered 2015-08-09 – 2015-08-14 (×9): 3 mL via INTRAVENOUS

## 2015-08-09 MED ORDER — FENOFIBRATE 54 MG PO TABS
54.0000 mg | ORAL_TABLET | Freq: Every day | ORAL | Status: DC
  Administered 2015-08-09 – 2015-08-14 (×6): 54 mg via ORAL
  Filled 2015-08-09 (×6): qty 1

## 2015-08-09 MED ORDER — INFLUENZA VAC SPLIT QUAD 0.5 ML IM SUSY
0.5000 mL | PREFILLED_SYRINGE | INTRAMUSCULAR | Status: AC
  Administered 2015-08-10: 0.5 mL via INTRAMUSCULAR
  Filled 2015-08-09: qty 0.5

## 2015-08-09 MED ORDER — FLUOXETINE HCL 20 MG PO CAPS
20.0000 mg | ORAL_CAPSULE | Freq: Every day | ORAL | Status: DC
  Administered 2015-08-09 – 2015-08-14 (×6): 20 mg via ORAL
  Filled 2015-08-09 (×6): qty 1

## 2015-08-09 MED ORDER — ACETAMINOPHEN 325 MG PO TABS
650.0000 mg | ORAL_TABLET | Freq: Four times a day (QID) | ORAL | Status: DC | PRN
  Administered 2015-08-11 – 2015-08-12 (×2): 650 mg via ORAL
  Filled 2015-08-09 (×2): qty 2

## 2015-08-09 MED ORDER — LEVOFLOXACIN IN D5W 750 MG/150ML IV SOLN
750.0000 mg | Freq: Every day | INTRAVENOUS | Status: AC
  Administered 2015-08-09 – 2015-08-10 (×3): 750 mg via INTRAVENOUS
  Filled 2015-08-09 (×3): qty 150

## 2015-08-09 MED ORDER — SODIUM CHLORIDE 0.9 % IV SOLN
Freq: Once | INTRAVENOUS | Status: AC
  Administered 2015-08-09: 15:00:00 via INTRAVENOUS

## 2015-08-09 MED ORDER — AMANTADINE HCL 100 MG PO CAPS
200.0000 mg | ORAL_CAPSULE | Freq: Two times a day (BID) | ORAL | Status: DC
  Administered 2015-08-09 – 2015-08-14 (×11): 200 mg via ORAL
  Filled 2015-08-09 (×14): qty 2

## 2015-08-09 MED ORDER — ACETAMINOPHEN 650 MG RE SUPP
650.0000 mg | Freq: Four times a day (QID) | RECTAL | Status: DC | PRN
Start: 2015-08-09 — End: 2015-08-14

## 2015-08-09 MED ORDER — CHLORHEXIDINE GLUCONATE CLOTH 2 % EX PADS
6.0000 | MEDICATED_PAD | Freq: Every day | CUTANEOUS | Status: AC
  Administered 2015-08-10 – 2015-08-14 (×5): 6 via TOPICAL

## 2015-08-09 MED ORDER — MUPIROCIN 2 % EX OINT
1.0000 "application " | TOPICAL_OINTMENT | Freq: Two times a day (BID) | CUTANEOUS | Status: AC
  Administered 2015-08-09 – 2015-08-13 (×10): 1 via NASAL
  Filled 2015-08-09 (×3): qty 22

## 2015-08-09 MED ORDER — CETYLPYRIDINIUM CHLORIDE 0.05 % MT LIQD
7.0000 mL | Freq: Two times a day (BID) | OROMUCOSAL | Status: DC
  Administered 2015-08-09 – 2015-08-11 (×5): 7 mL via OROMUCOSAL

## 2015-08-09 MED ORDER — SODIUM CHLORIDE 0.9 % IV SOLN
Freq: Once | INTRAVENOUS | Status: AC
  Administered 2015-08-09: 01:00:00 via INTRAVENOUS

## 2015-08-09 MED ORDER — SODIUM CHLORIDE 0.9 % IV SOLN: INTRAVENOUS | Status: DC

## 2015-08-09 MED ORDER — SODIUM CHLORIDE 0.9 % IV SOLN
Freq: Once | INTRAVENOUS | Status: AC
  Administered 2015-08-09: 08:00:00 via INTRAVENOUS

## 2015-08-09 MED ORDER — ATORVASTATIN CALCIUM 80 MG PO TABS
80.0000 mg | ORAL_TABLET | Freq: Every day | ORAL | Status: DC
  Administered 2015-08-09 – 2015-08-14 (×6): 80 mg via ORAL
  Filled 2015-08-09 (×6): qty 1

## 2015-08-09 NOTE — Evaluation (Addendum)
Clinical/Bedside Swallow Evaluation Patient Details  Name: [name] [name] MRN: [id number] Date of Birth: [birth year]  Today's Date: 08/09/2015 Time: SLP Start Time (ACUTE ONLY): 0850 SLP Stop Time (ACUTE ONLY): 0915 SLP Time Calculation (min) (ACUTE ONLY): 25 min  Past Medical History:  Past Medical History  Diagnosis Date  . Stroke   . Hypertension   . Hyperlipidemia   . Diabetes mellitus without complication   . Fall at home   . Hemiparesis affecting left side as late effect of stroke   . Dysphagia as late effect of cerebrovascular disease   . Acute respiratory failure   . Cerebral edema    Past Surgical History:  Past Surgical History  Procedure Laterality Date  . Gastrostomy tube placement  05-19-15  . Mechanical thrombectomy angioplasty stenet placement     HPI:  Pt is a 54 y.o. male presented to ED 9/30 with GI bleed. Recently admitted for CVA with residual L hemiplegia. Pt had feeding tube placed following stroke which was removed August 2016. Pt had MBS on 07/24/15 with recommendations for dysphagia 2 diet/ thin liquids with cues for small sips and intermittent 2nd swallow and full supervision; no penetration/ aspiration was observed on the MBS. CXR showed persistent RLL airspace opacity suspicious for PNA. Bedside swallow eval ordered to re-assess swallow function given apparent hx of recurrent aspiration PNAs.    Assessment / Plan / Recommendation Clinical Impression  Pt had immediate throat clear/ cough following 100% of trials of thin liquids by cup- said it felt like the liquid was stuck and pointed to his neck. No overt s/s of aspiration noted with puree/ dysphagia 2 consistencies. Pt had MBS on 07/24/15 indicating no penetration/ aspiration (primarily oral phase deficits); however, given current mentation and weakness, pt appears to be at an increased risk of aspiration. Pt also currently with suspected RLL PNA. Pt was initially unable to state name/ date of birth, and cough  was weak. Recommend that pt have a repeat MBS today to rule out aspiration. Until then, pt should remain NPO but appears safe to have meds crushed in puree, provide oral care every 4 hours. Will continue to follow.    Aspiration Risk  Severe    Diet Recommendation NPO   Medication Administration: Crushed with puree    Other  Recommendations Oral Care Recommendations: Oral care QID Other Recommendations: Have oral suction available   Follow Up Recommendations       Frequency and Duration  (TBD)      Pertinent Vitals/Pain C/o head pain- reclined HOB and pt said head pain was relieved. Alerted RN    SLP Swallow Goals     Swallow Study Prior Functional Status       General Other Pertinent Information: Pt is a 54 y.o. male presented to ED 9/30 with GI bleed. Recently admitted for CVA with residual L hemiplegia. Pt had feeding tube placed following stroke which was removed August 2016. Pt had MBS on 07/24/15 with recommendations for dysphagia 2 diet/ thin liquids with cues for small sips and intermittent 2nd swallow and full supervision; no penetration/ aspiration was observed on the MBS. CXR showed persistent RLL airspace opacity suspicious for PNA. Bedside swallow eval ordered to re-assess swallow function given apparent hx of recurrent aspiration PNAs.  Type of Study: Bedside swallow evaluation Previous Swallow Assessment: MBS 9/15- indicated moderate oral/ mild pharyngeal dysphagia- rec'd D2/ thin liquids with supervision Diet Prior to this Study: NPO Temperature Spikes Noted: Yes (low grade) Respiratory Status: Room  air History of Recent Intubation: No Behavior/Cognition: Cooperative;Requires cueing;Lethargic/Drowsy Oral Cavity - Dentition: Missing dentition;Poor condition Self-Feeding Abilities: Needs assist Patient Positioning: Upright in bed Baseline Vocal Quality: Normal Volitional Cough: Weak Volitional Swallow: Able to elicit    Oral/Motor/Sensory Function Overall Oral  Motor/Sensory Function: Impaired at baseline Labial ROM: Reduced left Labial Symmetry: Abnormal symmetry left Labial Strength: Reduced Lingual ROM: Within Functional Limits Lingual Symmetry: Abnormal symmetry left Lingual Strength: Reduced   Ice Chips Ice chips: Not tested   Thin Liquid Thin Liquid: Impaired Presentation: Cup Pharyngeal  Phase Impairments: Cough - Immediate;Multiple swallows    Nectar Thick Nectar Thick Liquid: Not tested   Honey Thick Honey Thick Liquid: Not tested   Puree Puree: Impaired Presentation: Self Fed;Spoon Oral Phase Impairments: Reduced labial seal Oral Phase Functional Implications: Left anterior spillage   Solid   GO    Solid: Impaired Presentation: Spoon Oral Phase Impairments: Impaired mastication Oral Phase Functional Implications: Other (comment) (prolonged mastication) Pharyngeal Phase Impairments: Multiple swallows       Ashante Yellin K, MA, CCC-SLP 08/09/2015,9:28 AM [phone]

## 2015-08-09 NOTE — Progress Notes (Signed)
Family Medicine Teaching Service Daily Progress Note Intern Pager: [phone]  Patient name: [name] [name] Medical record number: [id number] Date of birth: [birth year] Age: 54 y.o. Gender: male  Primary Care Provider: Gildardo Cranker, DO Consultants: GI Code Status: Full  Pt Overview and Major Events to Date:  9/30: Admitted for symptomatic anemia 2/2 GI bleed 10/1: Transfused 3rd unit of PRBCs  Assessment and Plan: [name] [name] is a 54 y.o. male presenting with AMS who was found to have a significant GI bleed. PMH is significant for CVA in 2016 with residual L hemiplegia (on ASA and Plavix), HTN, HLD.  1. GI Bleed/Acute Blood Loss Anemia: Pt noted to have black, tarry, hemoccult + stools. On admission Hgb was 5.0, down from 10.2 two weeks ago. Likely upper GI bleed, but cannot r/o diverticular bleed (h/o constipation). Patient had been on Plavix and ASA after MCA stent placement. - Hgb 5.0>>5.9>>5.7  - Minimal benefit from 2 units pRBCs  - Nurse states 3rd unit is on its way to floor; will follow-up w/ post-transfusion H/H - PT/INR 16.1/1.28 - HCT 17.7 >> reducing IVF to 1/2 maintenance as long as BP continues to be stable - GI has been consulted, appreciate recommendations.  - Anticipating consideration for EGD in future - Holding Plavix and ASA; discussed w/ neuro >> will restart as soon as safe - IV Protonix bid. - CBCs q8hrs after transfusions - Cardiac monitoring  2. AMS: Likely 2/2 acute blood loss anemia from GI bleed; patient has neuro deficits at baseline 2/2 recent h/o stroke, but was noted to have increased lethargy and confusion by OP caretakers.  - Transfusing with goal of Hgb > 8.0 due to h/o CVA - Neuro checks by nursing - Strict bed rest  3. Hx of CVA: Occurred in June 2016. Had a stent placed in the R MCA. Pt with residual L hemiplegia. Has been on ASA and Plavix. Deficits appreciated on PE, these are believed to be at baseline since the CVA. - Will hold ASA and  Plavix in the setting of acute GI bleed - SLP to perform swallow evaluation - CT in ED >> no acute process - Curb-sided Neuro about stent, see above  4. HCAP: being treated for HCAP at [facility]. CXR showing persistent asymmetric R lower lung airspace opacity, suspicious for pneumonia. Was on Levaquin x 10 days, discharged on 10 day course of Moxifloxacin. - Will finish course via Levaquin IV 750mg  (ends on 10/2)  5. HTN: - Holding home meds in the setting of acute GI bleed: Hydralazine, HCTZ, Lisinopril - Will restart if hypertensive  6. HLD: - Continue home med: Lipitor qd, Fenofibrate qd  6. Depression: Pt experienced depression after stroke - Continue home med: Prozac 20mg  qd  FEN/GI:  - NPO until GI clears him - SLP will be needed for swallow eval - MIVFs at 34ml/hr Prophylaxis: SCDs in the setting of acute GI bleed  Disposition: likely back to SNF  Subjective:  Patient still has some slowed mentation/processing. Some slurred speech, but seemingly improved (not resolved) confusion. Believes he is at [facility] for a stroke. Otherwise answered all questions appropriately. Asking for something to drink. Denies stools overnight. Denies HA, nausea, CP, SOB, dizziness, or vision changes.  Objective: Temp:  [98.1 F (36.7 C)-100.1 F (37.8 C)] 99.1 F (37.3 C) (10/01 0700) Pulse Rate:  [74-102] 89 (10/01 0349) Resp:  [14-27] 19 (10/01 0349) BP: (106-148)/(54-80) 126/73 mmHg (10/01 0349) SpO2:  [92 %-100 %] 98 % (10/01 0349)  Weight:  [229 lb 11.5 oz (104.2 kg)-230 lb (104.327 kg)] 229 lb 11.5 oz (104.2 kg) (10/01 0047) Physical Exam: General: male, laying in bed in NAD, answering questions but drowsy. HEENT: [place]/AT, PERRLA, conjunctival pallor, dry MM Neck: Supple Cardiovascular: RRR, no m/r/g Respiratory: CTAB Abdomen: +BS, soft, non-distended, TTP at epigastrium and LLQ; gastric tube scarring noted, no significant ecchymoses appreciated MSK: LUE is spastic and contracted  into flexion, Voluntary muscle use was extremely limited in RUE and RLE, 5/5 muscle strength in LUE and LLE; no clonus present; no edema in the LEs Neuro: A/O x2 (not to place). Speech is soft w/ slight slur; left sided facial droop present; difficult to thoroughly assess all CNs  Laboratory:  Recent Labs Lab 08/08/15 1448 08/08/15 2020 08/09/15 0627  WBC 13.9* 13.0* 12.4*  HGB 5.0* 5.9* 5.7*  HCT 15.6* 18.9* 17.7*  PLT 368 349 288    Recent Labs Lab 08/08/15 1448 08/09/15 0627  NA 140 141  K 3.6 3.7  CL 107 109  CO2 25 23  BUN 43* 34*  CREATININE 1.11 0.84  CALCIUM 8.5* 8.1*  PROT 6.6  --   BILITOT 0.6  --   ALKPHOS 144*  --   ALT 33  --   AST 31  --   GLUCOSE 126* 119*   INR/Prothrombin Time 1.28 / 16.1 Lactic Acid: 1.12  Imaging/Diagnostic Tests: CT Head 9/30 IMPRESSION: Prior RIGHT MCA territory infarct. Small vessel chronic ischemic changes of deep cerebral white matter. No acute intracranial abnormalities. Sphenoid sinus opacification.   Elberta Leatherwood, MD 08/09/2015, 10:26 AM PGY-2, [place] Intern pager: [phone], text pages welcome

## 2015-08-09 NOTE — Progress Notes (Signed)
Initial Nutrition Assessment  DOCUMENTATION CODES:   Not applicable  INTERVENTION:   -RD will follow for diet advancement and supplement diet as appropriate  NUTRITION DIAGNOSIS:   Inadequate oral intake related to altered GI function as evidenced by NPO status.  GOAL:   Patient will meet greater than or equal to 90% of their needs  MONITOR:   PO intake, Diet advancement, Labs, Weight trends, Skin, I & O's  REASON FOR ASSESSMENT:   Malnutrition Screening Tool    ASSESSMENT:   [name] [name] is a 54 y.o. male presenting with GI bleed. PMH is significant for CVA in 2016 with residual L hemiplegia (on ASA and Plavix), HTN, HLD.  Pt admitted for GI bleed. He is a resident of [facility]. Pt awaiting GI consultation.  Pt was mildly communicative; will answer closed ended questions by shaking his head to indicate "yes" or "no".   SLP evaluated pt earlier this AM; pt tolerated puree and dysphagia 2 consistencies as well as thin liquids well. Currently recommending NPO due to increased aspiration risk given weakness and current mentation. Plan for repeat MBS to r/o aspiration. Pt denied any swallowing difficulties PTA.   Wt hx reveals a 30# wt loss over the past 2 months, however, unsure of accuracy of weights.   Nutrition-Focused physical exam completed. Findings are no fat depletion, no muscle depletion, and no edema.   Labs reviewed.   Diet Order:  Diet NPO time specified  Skin:  Reviewed, no issues  Last BM:  PTA  Height:   Ht Readings from Last 1 Encounters:  08/09/15 6\' 2"  (1.88 m)    Weight:   Wt Readings from Last 1 Encounters:  08/09/15 229 lb 11.5 oz (104.2 kg)    Ideal Body Weight:  86.3 kg  BMI:  Body mass index is 29.48 kg/(m^2).  Estimated Nutritional Needs:   Kcal:  2000-2200  Protein:  100-110 grams  Fluid:  2.0-2.2 L  EDUCATION NEEDS:   No education needs identified at this time  Charlet Harr A. Jimmye Norman, RD, LDN, CDE Pager:  [phone] After hours Pager: [phone]

## 2015-08-09 NOTE — Progress Notes (Signed)
Progress Note   Subjective  **No complaints*.  No bowel movements   Objective  Vital signs in last 24 hours: Temp:  [98.1 F (36.7 C)-100.1 F (37.8 C)] 99.1 F (37.3 C) (10/01 0700) Pulse Rate:  [74-102] 89 (10/01 1059) Resp:  [14-27] 22 (10/01 1059) BP: (106-148)/(54-80) 132/78 mmHg (10/01 1059) SpO2:  [92 %-100 %] 97 % (10/01 1059) Weight:  [229 lb 11.5 oz (104.2 kg)-230 lb (104.327 kg)] 229 lb 11.5 oz (104.2 kg) (10/01 0047)    General: Alert, well-developed,  in NAD Heart:  Regular rate and rhythm; no murmurs Chest: Clear to ascultation bilaterally Abdomen:  Soft, nontender and nondistended. Normal bowel sounds, without guarding, and without rebound.   Extremities:  Without edema. Neurologic:  Alert and  oriented x4; grossly normal neurologically. Psych:  Alert and cooperative. Normal mood and affect.  Intake/Output from previous day: 09/30 0701 - 10/01 0700 In: 985 [I.V.:470; Blood:365; IV Piggyback:150] Out: -  Intake/Output this shift:    Lab Results:  Recent Labs  08/08/15 1448 08/08/15 2020 08/09/15 0627  WBC 13.9* 13.0* 12.4*  HGB 5.0* 5.9* 5.7*  HCT 15.6* 18.9* 17.7*  PLT 368 349 288   BMET  Recent Labs  08/08/15 1448 08/09/15 0627  NA 140 141  K 3.6 3.7  CL 107 109  CO2 25 23  GLUCOSE 126* 119*  BUN 43* 34*  CREATININE 1.11 0.84  CALCIUM 8.5* 8.1*   LFT  Recent Labs  08/08/15 1448  PROT 6.6  ALBUMIN 2.1*  AST 31  ALT 33  ALKPHOS 144*  BILITOT 0.6   PT/INR  Recent Labs  08/08/15 2020 08/09/15 0627  LABPROT 16.1* 17.0*  INR 1.28 1.37   Hepatitis Panel No results for input(s): HEPBSAG, HCVAB, HEPAIGM, HEPBIGM in the last 72 hours.  Studies/Results: Ct Head Wo Contrast  08/08/2015   CLINICAL DATA:  Increased confusion, altered mental status, more lethargic, history of prior stroke with LEFT hemi para cysts and facial droop, dysphagia, hypertension, diabetes mellitus  EXAM: CT HEAD WITHOUT CONTRAST  TECHNIQUE:  Contiguous axial images were obtained from the base of the skull through the vertex without intravenous contrast.  COMPARISON:  None  FINDINGS: Generalized atrophy.  Minimal ex vacuo dilatation of the RIGHT lateral ventricle versus LEFT.  No midline shift or mass effect.  Low-attenuation identified in RIGHT hemisphere corresponding to a prior RIGHT MCA infarct.  No intracranial hemorrhage, mass lesion, or evidence of acute infarction.  Underlying small vessel chronic ischemic changes of deep cerebral white matter.  No extra-axial fluid collections.  Opacified sphenoid sinus.  No acute osseous findings.  RIGHT MCA stent identified.  IMPRESSION: Prior RIGHT MCA territory infarct.  Small vessel chronic ischemic changes of deep cerebral white matter.  No acute intracranial abnormalities.  Sphenoid sinus opacification.   Electronically Signed   By: Lavonia Dana M.D.   On: 08/08/2015 16:40   Dg Chest Port 1 View  08/08/2015   CLINICAL DATA:  Acute onset central chest pain today.  EXAM: PORTABLE CHEST 1 VIEW  COMPARISON:  07/23/2015  FINDINGS: Heart size remains normal. Right lower lobe airspace disease is again seen, although there improved aeration of the right lung base. This remains suspicious for pneumonia.  IMPRESSION: Persistent asymmetric right lower lung airspace opacity, suspicious for pneumonia.   Electronically Signed   By: Earle Gell M.D.   On: 08/08/2015 15:39      Assessment & Plan  *He appears to have ongoing GI bleeding in  the absence of melena, as determined by his low Hg.  Alternatively Hg may have been lower than initially thought.  Recommend 1.  Continue high dose PPI 2.  Transfuse to Hg 7-8 3.  Emergent EGD if severe acute bleeding, otherwise will hold another 48 hours. ** Active Problems:   GI bleed   Acute blood loss anemia     LOS: 1 day   Erskine Emery  08/09/2015, 11:00 AM [phone] 8a-5p weekdays [phone] weekends, holidays and 5p-8a or per Amion

## 2015-08-09 NOTE — Progress Notes (Signed)
PT Cancellation Note  Patient Details Name: [name] [name] MRN: [id number] DOB: December 04, 1960   Cancelled Treatment:    Reason Eval/Treat Not Completed: Medical issues which prohibited therapy (Pt with 5.7 Hgb and to receive another unit of blood)   [name] 08/09/2015, 10:02 AM Suanne Marker PT [phone]

## 2015-08-09 NOTE — Progress Notes (Signed)
SLP Cancellation Note  Patient Details Name: KEEGEN HEFFERN MRN: [id number] DOB: [birth year]   Cancelled treatment:       Reason Eval/Treat Not Completed: Medical issues which prohibited therapy. Unable to complete MBS d/t GI bleed. Will reattempt when medically ready.   Kern Reap, [place], CCC-SLP 08/09/2015, 12:02 PM [phone]

## 2015-08-10 DIAGNOSIS — K921 Melena: Secondary | ICD-10-CM | POA: Diagnosis not present

## 2015-08-10 DIAGNOSIS — K922 Gastrointestinal hemorrhage, unspecified: Secondary | ICD-10-CM | POA: Insufficient documentation

## 2015-08-10 LAB — CBC
HCT: 21.5 % — ABNORMAL LOW (ref 39.0–52.0)
HEMATOCRIT: 25.7 % — AB (ref 39.0–52.0)
HEMATOCRIT: 25.1 % — AB (ref 39.0–52.0)
HEMOGLOBIN: 8.4 g/dL — AB (ref 13.0–17.0)
Hemoglobin: 7.1 g/dL — ABNORMAL LOW (ref 13.0–17.0)
Hemoglobin: 8.2 g/dL — ABNORMAL LOW (ref 13.0–17.0)
MCH: 28.5 pg (ref 26.0–34.0)
MCH: 28 pg (ref 26.0–34.0)
MCH: 28 pg (ref 26.0–34.0)
MCHC: 33 g/dL (ref 30.0–36.0)
MCHC: 32.7 g/dL (ref 30.0–36.0)
MCHC: 32.7 g/dL (ref 30.0–36.0)
MCV: 86.3 fL (ref 78.0–100.0)
MCV: 85.7 fL (ref 78.0–100.0)
MCV: 85.7 fL (ref 78.0–100.0)
PLATELETS: 252 10*3/uL (ref 150–400)
Platelets: 271 10*3/uL (ref 150–400)
Platelets: 268 10*3/uL (ref 150–400)
RBC: 2.49 MIL/uL — AB (ref 4.22–5.81)
RBC: 3 MIL/uL — AB (ref 4.22–5.81)
RBC: 2.93 MIL/uL — ABNORMAL LOW (ref 4.22–5.81)
RDW: 16.3 % — ABNORMAL HIGH (ref 11.5–15.5)
RDW: 16.3 % — ABNORMAL HIGH (ref 11.5–15.5)
RDW: 16.3 % — AB (ref 11.5–15.5)
WBC: 8.9 10*3/uL (ref 4.0–10.5)
WBC: 8.6 10*3/uL (ref 4.0–10.5)
WBC: 7.7 10*3/uL (ref 4.0–10.5)

## 2015-08-10 LAB — HEMOGLOBIN AND HEMATOCRIT, BLOOD
HEMATOCRIT: 25.1 % — AB (ref 39.0–52.0)
HEMOGLOBIN: 8.2 g/dL — AB (ref 13.0–17.0)

## 2015-08-10 LAB — BASIC METABOLIC PANEL
Anion gap: 9 (ref 5–15)
BUN: 23 mg/dL — ABNORMAL HIGH (ref 6–20)
CHLORIDE: 109 mmol/L (ref 101–111)
CO2: 24 mmol/L (ref 22–32)
CREATININE: 0.92 mg/dL (ref 0.61–1.24)
Calcium: 8.4 mg/dL — ABNORMAL LOW (ref 8.9–10.3)
Glucose, Bld: 111 mg/dL — ABNORMAL HIGH (ref 65–99)
POTASSIUM: 3.7 mmol/L (ref 3.5–5.1)
SODIUM: 142 mmol/L (ref 135–145)

## 2015-08-10 LAB — GLUCOSE, CAPILLARY: Glucose-Capillary: 113 mg/dL — ABNORMAL HIGH (ref 65–99)

## 2015-08-10 LAB — PROTIME-INR
INR: 1.33 (ref 0.00–1.49)
Prothrombin Time: 16.6 seconds — ABNORMAL HIGH (ref 11.6–15.2)

## 2015-08-10 LAB — PREPARE RBC (CROSSMATCH)

## 2015-08-10 MED ORDER — DEXTROSE-NACL 5-0.9 % IV SOLN
INTRAVENOUS | Status: DC
  Administered 2015-08-10 – 2015-08-11 (×5): via INTRAVENOUS
  Administered 2015-08-12: 1000 mL via INTRAVENOUS
  Administered 2015-08-12 (×2): via INTRAVENOUS

## 2015-08-10 MED ORDER — SODIUM CHLORIDE 0.9 % IV SOLN
Freq: Once | INTRAVENOUS | Status: AC
  Administered 2015-08-10: 02:00:00 via INTRAVENOUS

## 2015-08-10 NOTE — Progress Notes (Signed)
SLP Cancellation Note  Patient Details Name: [name] [name] MRN: [id number] DOB: 1961/07/03   Cancelled treatment:       Reason Eval/Treat Not Completed: Medical issues which prohibited therapy. Spoke with RN who reported that patient has not been cleared from a GI standpoint for pos at this time. Previous evaluation recommended meds crushed in puree if needed. Will plan for hold MBS this date and f/u 10/3 for readiness.  [name], CCC-SLP [phone]    [name] [name] 08/10/2015, 8:41 AM

## 2015-08-10 NOTE — Progress Notes (Signed)
Family Medicine Teaching Service Daily Progress Note Intern Pager: [phone]  Patient name: [name] [name] Medical record number: [id number] Date of birth: 18-Sep-1961 Age: 54 y.o. Gender: male  Primary Care Provider: Gildardo Cranker, DO Consultants: GI Code Status: Full  Pt Overview and Major Events to Date:  9/30: Admitted for symptomatic anemia 2/2 GI bleed 10/1: Transfused 3rd unit of PRBCs  Assessment and Plan: [name] [name] is a 54 y.o. male presenting with AMS who was found to have a significant GI bleed. PMH is significant for CVA in 2016 with residual L hemiplegia (on ASA and Plavix), HTN, HLD.  1. GI Bleed/Acute Blood Loss Anemia: Pt with melenotic / heme + stools. On admission Hgb was 5.0, has required 4 transfusions to date. Likely upper GI bleed given melena and elevated BUN 42>>23, but cannot r/o diverticular bleed (h/o constipation). No BRB. Patient had been on Plavix and ASA after MCA stent placement. Vital signs stable at this time.  - step down status. Watching closely.  - Hgb 5.0>> 7.9 > 7.1 > overnight txf now 8.2.   - continues to have dropping hgb after txf and continued melenotic stools.   - Trending hgb every 8 hours.  - transfuse to goal of 7-8. Ideally, > 8  - PT/INR pending.  - D5NS given NPO at MIVF.  - GI has been consulted, appreciate recommendations.  - EGD sooner rather than later ideally given continued bleed, melenotic stool, and transfusion  requirement.   - will follow up their recommendations.  - NPO - Holding Plavix and ASA; discussed w/ neuro >> will restart as soon as safe - IV Protonix bid. - Cardiac monitoring  2. AMS: Likely 2/2 acute blood loss anemia from GI bleed vs. Uremia; patient has neuro deficits at baseline 2/2 recent h/o stroke, but was noted to have increased lethargy and confusion by OP caretakers.  This has improved this am, but he remains somewhat lethargic.  - Transfusing with goal of Hgb >7- 8.0 due to h/o CVA - Neuro checks by  nursing - Strict bed rest  3. Hx of CVA: Occurred in June 2016. Had a stent placed in the R MCA. Pt with residual L hemiplegia. Has been on ASA and Plavix. Deficits appreciated on PE, these are believed to be at baseline since the CVA. - Will hold ASA and Plavix in the setting of acute GI bleed - SLP to perform swallow eval. Felt that he was at risk for aspiration, but has been unable to do so at this time given NPO status.  - CT in ED >> no acute process - Curb-sided Neuro about stent, see above  4. HCAP: being treated for HCAP at [facility]. CXR showing persistent asymmetric R lower lung airspace opacity, suspicious for pneumonia. Was on Levaquin x 10 days, discharged on 10 day course of Moxifloxacin. - Will finish course via Levaquin IV 750mg  (ends on 10/2)  5. HTN: - Holding home meds in the setting of acute GI bleed: Hydralazine, HCTZ, Lisinopril - Will restart if hypertensive  6. HLD: - Continue home med: Lipitor qd, Fenofibrate qd  6. Depression: Pt experienced depression after stroke - Continue home med: Prozac 20mg  qd  FEN/GI:  - NPO until GI clears him - SLP will be needed for swallow eval after EGD.  - MIVFs 144 cc / hr of D5NS while NPO.  Prophylaxis: SCDs in the setting of acute GI bleed  Disposition: likely back to SNF  Subjective:  Somewhat lethargic in his  response this am. Of note, he had a large melenotic stool in the bed upon my entrance. He denies nausea, vomiting, epigastric pain. He denies fever or chills. He has some lower abdominal pain. He says that he feels a little bit better. Able to state his name, location, and day. Comfortable lying on his back in bed. No chest pain, no chills. No difficulty breathing.   Objective: Temp:  [98.3 F (36.8 C)-99.5 F (37.5 C)] 99.3 F (37.4 C) (10/02 0705) Pulse Rate:  [67-91] 75 (10/02 0705) Resp:  [14-28] 17 (10/02 0705) BP: (119-140)/(75-93) 132/84 mmHg (10/02 0705) SpO2:  [96 %-100 %] 98 % (10/02 0705) Physical  Exam: General: NAD, Somewhat slow to respond, soft voice, but able to respond to questions and commands coherently.  HEENT: [place]/AT, PERRLA, some slight conjunctival palor, MMM, No LAD Neck: Supple, no thyromegaly.  Cardiovascular: RRR, no m/r/g, 2+ distal pulses.  Respiratory: Slight crackles in the right base, but otherwise clear, appropriate rate, unlabored.  Abdomen: +BS, S, ND, +BS, No peritoneal signs, but discomfort noted to palpation predominantly in the left lower quadrant. gastric tube scarring noted, no significant ecchymoses appreciated MSK: LUE / LLE both weak, but he is able to move them with some difficulty, no edema in the LEs Neuro: AAOx 3. Speech is soft; left sided facial droop present; difficult to thoroughly assess all CNs, LLE, LUE with 4/5 weakness grossly, RUE/RLE with 5/5 motor. Gait deferred. No clonus noted.   Laboratory:  Recent Labs Lab 08/09/15 1838 08/09/15 2310 08/10/15 0235 08/10/15 0744  WBC 11.2* 9.7 8.9  --   HGB 7.9* 7.4* 7.1* 8.2*  HCT 23.6* 22.4* 21.5* 25.1*  PLT 269 277 252  --     Recent Labs Lab 08/08/15 1448 08/09/15 0627 08/10/15 0235  NA 140 141 142  K 3.6 3.7 3.7  CL 107 109 109  CO2 25 23 24   BUN 43* 34* 23*  CREATININE 1.11 0.84 0.92  CALCIUM 8.5* 8.1* 8.4*  PROT 6.6  --   --   BILITOT 0.6  --   --   ALKPHOS 144*  --   --   ALT 33  --   --   AST 31  --   --   GLUCOSE 126* 119* 111*   INR/Prothrombin Time 1,37/17 Lactic Acid: 1.12  Imaging/Diagnostic Tests: CT Head 9/30 IMPRESSION: Prior RIGHT MCA territory infarct. Small vessel chronic ischemic changes of deep cerebral white matter. No acute intracranial abnormalities. Sphenoid sinus opacification.   [name] Hacker, MD 08/10/2015, 10:22 AM PGY-2, [place] Intern pager: [phone], text pages welcome

## 2015-08-10 NOTE — Progress Notes (Signed)
PT Cancellation Note  Patient Details Name: [name] [name] MRN: [id number] DOB: 14-Apr-1961   Cancelled Treatment:    Reason Eval/Treat Not Completed: Medical issues which prohibited therapy (bedrest, and Hgb 5.7).  Will hold PT today.  Will return tomorrow for PT evaluation if appropriate for patient.   Despina Pole 08/10/2015, 9:28 AM Carita Pian. Sanjuana Kava, [place] Pager [phone]

## 2015-08-10 NOTE — Clinical Social Work Note (Signed)
Clinical Social Work Assessment  Patient Details  Name: [name] [name] MRN: [id number] Date of Birth: 12/24/1960  Date of referral:  08/10/15               Reason for consult:  Facility Placement (From [place])                Permission sought to share information with:  Case Freight forwarder, Customer service manager, Family Supports Permission granted to share information::  Yes, Verbal Permission Granted  Name::     [employer] of [place]::  Daughter [name]  Relationship::  Daughter  Contact Information:     Housing/Transportation Living arrangements for the past 2 months:  [place] of Information:  Patient, Scientist, water quality, Adult Children Patient Interpreter Needed:  None Criminal Activity/Legal Involvement Pertinent to Current Situation/Hospitalization:  No - Comment as needed Significant Relationships:  Adult Children, Other Family Members, Friend Lives with:    Do you feel safe going back to the place where you live?  Yes (However patient does want to go home, daughter wants for him to come home, but only if able.) Need for family participation in patient care:  Yes (Comment) (per request of patient)  Care giving concerns:  Spoke with patient at the bedside, with limited information due to current illness and surgeries.  Permission given from patient verbally to speak with daughter [name]. When patient asked if he wants to return to SNF, he reports no, please call my daughter. Daughter called, reports patient is a short term resident at [facility]. Has been there for a few months and ideally would like him home. Reports she feels SNF has helped him improve, and this set back is frustrating but is open for him to return if needed, vs coming home. Daughter very reasonable and appreciative of consult and information. If she can manage him at home she will take him home with home health/Private duty sitter list.  If not she  wants him to return to get stronger at [facility]   Social Worker assessment / plan:  Completed assessment with daughter. Daughter updated on patient status and interventions. Daughter at this time is only care giver for patient and decision maker. Reports other family is around, but not involved and it all falls on her. She is a mom of 4 and also a Ship broker and work in Medical laboratory scientific officer. Discussed community options with daughter as well returning to SNF.  Pending decision at this time as daughter wants to see how patient improves in hospital with diet, speech, and PT/OT. All consults are pending at patient is on bedrest.  LCSW will updated FL2 and work patient up to return to SNF and daughter is agreeable. If patient improves, she will take him home. Most likely due to so much care needed for patient, he will go back to SNF, then home after rehab completed. Unit CSW updated and handoff given.  Employment status:  Disabled (Comment on whether or not currently receiving Disability) Insurance information:  Medicare PT Recommendations:  [place] (PT pending at this time due to bed rest, patient is from a SNF) Information / Referral to community resources:  [place], Other (Comment Required) (?home health and private duty list if he does go home)  Patient/Family's Response to care:  Agreeable with plan mentioned above.    Patient/Family's Understanding of and Emotional Response to Diagnosis, Current Treatment, and Prognosis:  Daughter very reasonable and involved  in patient care. She understands barriers to returning home and acute medical illness with acute GI Bleed.  Daughter is tearful and overwhelmed with cargiving and guilt associated with father in SNF from other family members. Strengths perspective and support given to daughter which she reports she is Patent attorney.   Emotional Assessment Appearance:  Appears older than stated age Attitude/Demeanor/Rapport:  Other (limited in  communication due to illness, pleasant and cooperative) Affect (typically observed):  Accepting, Pleasant Orientation:  Oriented to Self, Oriented to Place, Oriented to Situation Alcohol / Substance use:  Not Applicable Psych involvement (Current and /or in the community):  No (Comment)  Discharge Needs  Concerns to be addressed:  Care Coordination, Adjustment to Illness Readmission within the last 30 days:  No Current discharge risk:  Chronically ill Barriers to Discharge:  Continued Medical Work up (Daughter deciding to return to SNF vs home with her)   Marshell Garfinkel 08/10/2015, 10:13 AM

## 2015-08-10 NOTE — Progress Notes (Signed)
Utilization Review Completed.[name] [name] T10/12/2014  

## 2015-08-11 DIAGNOSIS — D62 Acute posthemorrhagic anemia: Secondary | ICD-10-CM

## 2015-08-11 LAB — CBC
HCT: 26.7 % — ABNORMAL LOW (ref 39.0–52.0)
HEMATOCRIT: 25.5 % — AB (ref 39.0–52.0)
HEMOGLOBIN: 8.5 g/dL — AB (ref 13.0–17.0)
HEMOGLOBIN: 8.3 g/dL — AB (ref 13.0–17.0)
MCH: 27.5 pg (ref 26.0–34.0)
MCH: 28 pg (ref 26.0–34.0)
MCHC: 31.8 g/dL (ref 30.0–36.0)
MCHC: 32.5 g/dL (ref 30.0–36.0)
MCV: 86.4 fL (ref 78.0–100.0)
MCV: 86.1 fL (ref 78.0–100.0)
PLATELETS: 244 10*3/uL (ref 150–400)
Platelets: 244 10*3/uL (ref 150–400)
RBC: 3.09 MIL/uL — AB (ref 4.22–5.81)
RBC: 2.96 MIL/uL — AB (ref 4.22–5.81)
RDW: 16.4 % — ABNORMAL HIGH (ref 11.5–15.5)
RDW: 16.2 % — ABNORMAL HIGH (ref 11.5–15.5)
WBC: 7.4 10*3/uL (ref 4.0–10.5)
WBC: 7.9 10*3/uL (ref 4.0–10.5)

## 2015-08-11 LAB — BASIC METABOLIC PANEL
ANION GAP: 6 (ref 5–15)
BUN: 12 mg/dL (ref 6–20)
CALCIUM: 8.4 mg/dL — AB (ref 8.9–10.3)
CHLORIDE: 111 mmol/L (ref 101–111)
CO2: 25 mmol/L (ref 22–32)
CREATININE: 0.85 mg/dL (ref 0.61–1.24)
GFR calc non Af Amer: >60 mL/min (ref >60)
Glucose, Bld: 125 mg/dL — ABNORMAL HIGH (ref 65–99)
Potassium: 3.9 mmol/L (ref 3.5–5.1)
SODIUM: 142 mmol/L (ref 135–145)

## 2015-08-11 LAB — GLUCOSE, CAPILLARY: Glucose-Capillary: 119 mg/dL — ABNORMAL HIGH (ref 65–99)

## 2015-08-11 LAB — TYPE AND SCREEN
ABO/RH(D): A POS
ANTIBODY SCREEN: NEGATIVE

## 2015-08-11 MED ORDER — HYDRALAZINE HCL 20 MG/ML IJ SOLN
10.0000 mg | Freq: Four times a day (QID) | INTRAMUSCULAR | Status: DC | PRN
  Filled 2015-08-11: qty 1

## 2015-08-11 MED ORDER — LISINOPRIL 40 MG PO TABS
40.0000 mg | ORAL_TABLET | Freq: Every day | ORAL | Status: DC
  Administered 2015-08-11 – 2015-08-14 (×4): 40 mg via ORAL
  Filled 2015-08-11 (×4): qty 1

## 2015-08-11 NOTE — Progress Notes (Signed)
OT Cancellation Note  Patient Details Name: [name] [name] MRN: [id number] DOB: January 08, 1961   Cancelled Treatment:    Reason Eval/Treat Not Completed: Other (comment) (PT in with pt.)  Benito Mccreedy OTR/L [phone] 08/11/2015, 11:07 AM

## 2015-08-11 NOTE — Progress Notes (Signed)
Daily Rounding Note  08/11/2015, 8:39 AM  LOS: 3 days   SUBJECTIVE:       One large black stool yesterday AM.  Nothing since.  No abd pain.  Nods yes to nausea.  NPO except chips with meds  OBJECTIVE:         Vital signs in last 24 hours:    Temp:  [97.3 F (36.3 C)-99.1 F (37.3 C)] 98.2 F (36.8 C) (10/03 0825) Pulse Rate:  [62-74] 62 (10/03 0824) Resp:  [19-24] 22 (10/03 0824) BP: (116-152)/(67-93) 128/67 mmHg (10/03 0824) SpO2:  [98 %-99 %] 98 % (10/03 0824) Last BM Date: 08/10/15 Filed Weights   08/08/15 1408 08/09/15 0047  Weight: 230 lb (104.327 kg) 229 lb 11.5 oz (104.2 kg)   General: alert, comfortable, pale.   Heart: RRR Chest: clear bil.  No cough or labored breathing Abdomen: soft, NT, ND.  Active BS Rectal: no mass, black/rapidly 4+ FOBT + stool. GU: condom cath in place.  Urine sraw colored.   Extremities: edema in left arm and in left>right foot Neuro/Psych:  Cooperative, follows commands, severe dysarthria. Moves all 4 limbs but weakness in left UE/LE,   Intake/Output from previous day: 10/02 0701 - 10/03 0700 In: 582 [I.V.:582] Out: 750 [Urine:750]  Intake/Output this shift: Total I/O In: 288 [I.V.:288] Out: -   Lab Results:  Recent Labs  08/10/15 1426 08/10/15 2151 08/11/15 0529  WBC 8.6 7.7 7.4  HGB 8.4* 8.2* 8.5*  HCT 25.7* 25.1* 26.7*  PLT 271 268 244   BMET  Recent Labs  08/09/15 0627 08/10/15 0235 08/11/15 0529  NA 141 142 142  K 3.7 3.7 3.9  CL 109 109 111  CO2 23 24 25   GLUCOSE 119* 111* 125*  BUN 34* 23* 12  CREATININE 0.84 0.92 0.85  CALCIUM 8.1* 8.4* 8.4*   LFT  Recent Labs  08/08/15 1448  PROT 6.6  ALBUMIN 2.1*  AST 31  ALT 33  ALKPHOS 144*  BILITOT 0.6   PT/INR  Recent Labs  08/09/15 0627 08/10/15 1233  LABPROT 17.0* 16.6*  INR 1.37 1.33   Hepatitis Panel No results for input(s): HEPBSAG, HCVAB, HEPAIGM, HEPBIGM in the last 72  hours.  Studies/Results: No results found.   Scheduled Meds: . amantadine  200 mg Oral BID  . antiseptic oral rinse  7 mL Mouth Rinse BID  . atorvastatin  80 mg Oral q1800  . Chlorhexidine Gluconate Cloth  6 each Topical Q0600  . fenofibrate  54 mg Oral Daily  . FLUoxetine  20 mg Oral Daily  . mupirocin ointment  1 application Nasal BID  . pantoprazole (PROTONIX) IV  40 mg Intravenous Q12H  . sodium chloride  3 mL Intravenous Q12H   Continuous Infusions: . dextrose 5 % and 0.9% NaCl 144 mL/hr at 08/11/15 0800   PRN Meds:.acetaminophen **OR** acetaminophen   ASSESMENT:   *  Acute anemia with FOBT +/black stools.   S/p PRBCs x 5.  Last was early yesterday AM.   Remains on BID IV Protonix.   *  Neurogenic dysphagia. Last SLP MBSS was 9/15: "Moderate oral phase dysphagia; Mild pharyngeal phase dysphagia... SLP recommends continuation with current diet; fine chop and thin liquid with small sips/bites and intermittent second swallow with full supervision to assist with feeding and cuing for compensations.  *  HX CVAs, latest 04/2015.  sigificant neurologic impairment.    *  S/p unsuccesful 7/1 right MCA thrombectomy  but subsequent succesful stenting.  Chronic Plavix, on hold.  Last dose estimated 9/29.   *  HCAP, recurrent aspiration PNAs.  Recently completed course of Levaquin om 10/2.  *  CKD, improved c/w this summer at [facility].  BUN elevated to 43 at admission, now normal.     PLAN   *  Will set pt up for EGD tomorrow.   *  Will allow for D2 diet today, NPO post midnight.  *  Switch to po Protonix.    Azucena Freed  08/11/2015, 8:39 AM Pager: [phone]     Attending physician's note   I have taken an interval history, reviewed the chart and examined the patient. I agree with the Advanced Practitioner's note, impression and recommendations. EGD planned for tomorrow to evaluate melena and ABL anemia. Continue PPI. Plavix on hold. Follow SLP recommendations for neurogenic  dysphagia.   Lucio Edward, MD [facility] [phone] Mon-Fri 8a-5p [phone] after 5p, weekends, holidays

## 2015-08-11 NOTE — Evaluation (Signed)
Physical Therapy Evaluation Patient Details Name: [name] [name] MRN: [id number] DOB: 10-23-1961 Today's Date: 08/11/2015   History of Present Illness  Pt is a 54 y.o. male presented to ED 9/30 with GI bleed. Recently admitted for CVA with residual L hemiplegia.  Clinical Impression  Patient presents with decreased independence with mobility due to deficits listed in PT problem list.  Patient weak with NPO status and blood loss and will benefit from skilled PT in the acute setting with probable need for return to SNF short term prior to d/c home.  Will follow acutely and update d/c recommendations if improved and able to go home.     Follow Up Recommendations SNF    Equipment Recommendations  Other (comment) (TBA in next venue)    Recommendations for Other Services       Precautions / Restrictions Precautions Precautions: Fall Precaution Comments: left hemiplegia UE>LE      Mobility  Bed Mobility Overal bed mobility: Needs Assistance Bed Mobility: Supine to Sit     Supine to sit: HOB elevated     General bed mobility comments: assist to bring legs off bed and lift trunk, once initiated pt lifted trunk easily, but very slow to respond and reports feeling weak  Transfers Overall transfer level: Needs assistance Equipment used: 2 person hand held assist Transfers: Sit to/from [employer] Transfers Sit to Stand: Mod assist;+2 physical assistance Stand pivot transfers: Mod assist       General transfer comment: cues and anterior weight shift with increased time to initiate movement with max cues, stood few seconds, then returned to sit due to c/o light headed, noted BP 160's/90's-100's throughout session; patient stand pivot to [facility] then back to bed with max cues, facilitation for weight shift and HHA for stability  Ambulation/Gait             General Gait Details: deferred due to weakness  Stairs            Wheelchair Mobility    Modified Rankin  (Stroke Patients Only)       Balance Overall balance assessment: Needs assistance   Sitting balance-Leahy Scale: Fair     Standing balance support: Bilateral upper extremity supported Standing balance-Leahy Scale: Poor Standing balance comment: Assist for balance                              Pertinent Vitals/Pain Faces Pain Scale: Hurts little more Pain Location: left UE with movement Pain Descriptors / Indicators: Aching Pain Intervention(s): Limited activity within patient's tolerance;Monitored during session;Repositioned    Home Living Family/patient expects to be discharged to:: Skilled nursing facility                      Prior Function Level of Independence: Needs assistance   Gait / Transfers Assistance Needed: pt poor historian, but reports was walking with cane in therapy, used wheelchair mainly and could toilet himself           Hand Dominance        Extremity/Trunk Assessment   Upper Extremity Assessment: Defer to OT evaluation           Lower Extremity Assessment: RLE deficits/detail;LLE deficits/detail RLE Deficits / Details: AAROM WFL, strength with MMT unable to lift antigravity, but functional strength better with sit to stand LLE Deficits / Details: AAROM WFL, strength with MMT unable to lift antigravity, but functional strength better with sit  to stand     Communication   Communication: Expressive difficulties (very slow to respond, needs repeated cues to express his needs and mainly nods yes and no)  Cognition Arousal/Alertness: Awake/alert Behavior During Therapy: Flat affect Overall Cognitive Status: Impaired/Different from baseline Area of Impairment: Problem solving;Following commands;Orientation Orientation Level: Time;Situation     Following Commands: Follows one step commands with increased time;Follows one step commands inconsistently     Problem Solving: Requires verbal cues;Requires tactile  cues;Decreased initiation;Slow processing;Difficulty sequencing General Comments: increased time to respond and manual assist with cues for follow through on directions    General Comments      Exercises General Exercises - Upper Extremity Shoulder Flexion: PROM;Left;5 reps;Supine Elbow Flexion: PROM;Left;5 reps;Supine Elbow Extension: PROM;Left;5 reps;Supine Wrist Flexion: PROM;Left;5 reps;Supine Wrist Extension: PROM;Left;5 reps;Supine      Assessment/Plan    PT Assessment Patient needs continued PT services  PT Diagnosis Generalized weakness;Difficulty walking   PT Problem List Decreased strength;Decreased knowledge of use of DME;Decreased activity tolerance;Decreased balance;Decreased mobility;Decreased safety awareness;Decreased cognition  PT Treatment Interventions DME instruction;Balance training;Gait training;Neuromuscular re-education;Functional mobility training;Patient/family education;Therapeutic activities;Therapeutic exercise   PT Goals (Current goals can be found in the Care Plan section) Acute Rehab PT Goals Patient Stated Goal: To go home PT Goal Formulation: With patient Time For Goal Achievement: 08/25/15 Potential to Achieve Goals: Good    Frequency Min 3X/week   Barriers to discharge        Co-evaluation               End of Session Equipment Utilized During Treatment: Gait belt Activity Tolerance: Patient limited by fatigue Patient left: in bed;with call bell/phone within reach;with bed alarm set           Time: 1275-1700 PT Time Calculation (min) (ACUTE ONLY): 47 min   Charges:   PT Evaluation $Initial PT Evaluation Tier I: 1 Procedure PT Treatments $Therapeutic Activity: 23-37 mins   PT G Codes:        Haven Foss,CYNDI 08-30-15, 12:50 PM  Magda Kiel, [place] Aug 30, 2015

## 2015-08-11 NOTE — Progress Notes (Signed)
SLP Cancellation Note  Patient Details Name: [name] [name] MRN: [id number] DOB: 1961-05-10   Cancelled treatment:       Reason Eval/Treat Not Completed: Patient at procedure or test/unavailable Unable to complete MBS due to EGD scheduled for tomorrow. Discussed with RN pt should remain NPO until MBS can be completed.  Lanier Ensign, Student-SLP  Lanier Ensign 08/11/2015, 2:28 PM

## 2015-08-11 NOTE — Discharge Summary (Signed)
[facility] Discharge Summary  Patient name: [name] [name] Medical record number: [id number] Date of birth: 1961-09-09 Age: 54 y.o. Gender: male Date of Admission: 08/08/2015  Date of Discharge: 08/14/2015 Admitting Physician: Dickie La, MD  Primary Care Provider: Gildardo Cranker, DO Consultants: GI  Indication for Hospitalization: GI bleed with Hgb of 5  Discharge Diagnoses/Problem List:  Patient Active Problem List   Diagnosis Date Noted  . Hypokalemia   . Gastrointestinal hemorrhage with melena   . Bleeding gastrointestinal   . H/O: stroke with residual effects   . GI bleed 08/08/2015  . Acute blood loss anemia 08/08/2015  . Acute CVA (cerebrovascular accident) ([place]) 06/02/2015  . Hemiparesis affecting left side as late effect of stroke ([place]) 06/02/2015  . Dyslipidemia 06/02/2015  . Type II diabetes mellitus with neurological manifestations ([place]) 06/02/2015  . Essential hypertension, malignant 06/02/2015  . Chronic constipation 06/02/2015  . Depression due to stroke ([place]) 06/02/2015  . Dysphagia as late effect of stroke 06/02/2015    Disposition: [facility] (SNF)  Discharge Condition: stable  Discharge Exam:  Gen: awake, NAD Eyes: PERRLA, sclera anicteric Oropharynx: clear, moist CV: RRR. S1 & S2 audible, 2+ radial and DP pulses bilaterally. Resp: no apparent WOB, CTAB. Abd: +BS. Soft, slightly tender to palpation, no rebound or guarding.  Ext: No edema or gross deformities. Neuro: Alert and oriented, left sided ext weakness 2/5 in LUE, 4/5 in LLE  Brief Hospital Course:  [name] [name] is a 54 y.o. male presenting with AMS who was found to have a significant GI bleed. PMH is significant for CVA in 2016 with residual L hemiplegia (on ASA and Plavix), HTN, HLD.  GI Bleed/Acute Blood Loss Anemia: Pt with melenotic stools. On admission Hgb was 5.0, has required 5 units of pRBCs in total during his hospitalization. He received  one unit in ED, then 3 units while in step-down unit. His hemoglobin came up to 7.9. However, found to to have melenotic stool and his Hgb fell dow to 7.1. He received another unit of blood and his and Hgb improved to 8.2 and stayed stable in mid 8's. PT/INR 17/1.37 on presentation. Held his home Plavix and ASA. Patient had been on Plavix and ASA after RMCA stent placement in July 2016. EGD on 10/4 significant for errossive gastritis no active bleeding. Started on high dose PPI, BID for 4 weeks, then every morning for long term. H. Pylori Ab was positive for IgG. We started him on Triple therapy with Amoxicillin 1000 mg bid, Clarithromycin 500 mg bid and pantoprazole 40 mg  Bid. We have also resumed his Plavix and ASA two days prior to discharge.  On the day of discharge, his Hgb stable at 8.5.   AMS: resolved. Likely 2/2 acute blood loss anemia from GI bleed. He also has neuro deficits at baseline 2/2 recent h/o stroke, but was noted to have increased lethargy and confusion by OP caretakers. CT head in ED with no acute process.    Hypokalemia: found to have potassium of 2.9 on his third day of hospitalization. This was repleted with oral K 40 mEq time three. His K on discharge was 3.3. This was before he received his his third dose of K.  Other chronic conditions stable.  Issues for Follow Up:  1. GI bleed/Anemia: check H&H at follow up 2. Check electrolyte (BMP) particularly his K 3. Need follow up with neurology for his stroke 4. H. Pylori: test of cure in about  a month. Need to be off PPI for two weeks prior to test.  Significant Procedures: EGD  Significant Labs and Imaging:   Recent Labs Lab 08/13/15 0329 08/13/15 1405 08/14/15 0752  WBC 6.3 7.3 6.4  HGB 7.8* 8.5* 8.5*  HCT 23.1* 25.7* 26.1*  PLT 213 224 235    Recent Labs Lab 08/08/15 1448  08/11/15 0529 08/12/15 0432 08/13/15 0329 08/13/15 1718 08/14/15 0752  NA 140  < > 142 143 138 138 137  K 3.6  < > 3.9 4.3 2.9*  3.2* 3.3*  CL 107  < > 111 111 105 104 105  CO2 25  < > 25 21* 25 25 27   GLUCOSE 126*  < > 125* 100* 101* 86 88  BUN 43*  < > 12 7 5* <5* 5*  CREATININE 1.11  < > 0.85 0.78 0.79 0.79 0.82  CALCIUM 8.5*  < > 8.4* 7.0* 7.9* 8.1* 8.3*  ALKPHOS 144*  --   --   --   --   --   --   AST 31  --   --   --   --   --   --   ALT 33  --   --   --   --   --   --   ALBUMIN 2.1*  --   --   --   --   --   --   < > = values in this interval not displayed.  Results/Tests Pending at Time of Discharge: none  Discharge Medications:    Medication List    STOP taking these medications        acetaminophen 325 MG tablet  Commonly known as:  TYLENOL     oxycodone 5 MG capsule  Commonly known as:  OXY-IR      TAKE these medications        amantadine 100 MG capsule  Commonly known as:  SYMMETREL  200 mg by Gastrostomy Tube route 2 (two) times daily.     amLODipine 10 MG tablet  Commonly known as:  NORVASC  10 mg by Gastrostomy Tube route daily.     amoxicillin 500 MG capsule  Commonly known as:  AMOXIL  Take 2 capsules (1,000 mg total) by mouth every 12 (twelve) hours.     aspirin 325 MG tablet  Take 1 tablet (325 mg total) by mouth daily.     atorvastatin 80 MG tablet  Commonly known as:  LIPITOR  Take 1 tablet (80 mg total) by mouth daily at 6 PM.     clarithromycin 500 MG tablet  Commonly known as:  BIAXIN  Take 1 tablet (500 mg total) by mouth every 12 (twelve) hours.     clopidogrel 75 MG tablet  Commonly known as:  PLAVIX  75 mg by Gastrostomy Tube route daily.     Fenofibrate 40 MG Tabs  1 tablet by Gastrostomy Tube route every evening.     FLUoxetine 20 MG capsule  Commonly known as:  PROZAC  20 mg by Gastrostomy Tube route daily.     hydrALAZINE 10 MG tablet  Commonly known as:  APRESOLINE  Take 10 mg by mouth every 8 (eight) hours.     hydrochlorothiazide 25 MG tablet  Commonly known as:  HYDRODIURIL  25 mg by Gastrostomy Tube route daily.      HYDROcodone-acetaminophen 5-325 MG tablet  Commonly known as:  NORCO/VICODIN  Take one tablet by mouth every 6 hours as needed for  pain. Not to exceed 3000mg  of APAP from all sources/24h     lisinopril 40 MG tablet  Commonly known as:  PRINIVIL,ZESTRIL  Take 40 mg by mouth daily.     minoxidil 2.5 MG tablet  Commonly known as:  LONITEN  5 mg by Gastrostomy Tube route 2 (two) times daily.     Multiple Vitamins tablet  Take 1 tablet by mouth daily.     pantoprazole 40 MG tablet  Commonly known as:  PROTONIX  Take 1 tablet (40 mg total) by mouth 2 (two) times daily.     RESOURCE THICKENUP CLEAR Powd  Take 3,600 g by mouth as needed.     sennosides-docusate sodium 8.6-50 MG tablet  Commonly known as:  SENOKOT-S  Take 1 tablet by mouth daily.        Discharge Instructions: Please refer to Patient Instructions section of EMR for full details.  Patient was counseled important signs and symptoms that should prompt return to medical care, changes in medications, dietary instructions, activity restrictions, and follow up appointments.   Follow-Up Appointments: Patient need PCP follow up in about a week after discharge. Mercy Riding, MD 08/14/2015, 2:02 PM PGY-1, [place]

## 2015-08-11 NOTE — Progress Notes (Signed)
Family Medicine Teaching Service Daily Progress Note Intern Pager: [phone]  Patient name: [name] [name] Medical record number: [id number] Date of birth: 1961/05/06 Age: 54 y.o. Gender: male  Primary Care Provider: Gildardo Cranker, DO Consultants: GI Code Status: Full  Pt Overview and Major Events to Date:  9/30: Admitted for symptomatic anemia 2/2 GI bleed 10/1: Transfused 3rd unit of PRBCs  Assessment and Plan: [name] [name] is a 54 y.o. male presenting with AMS who was found to have a significant GI bleed. PMH is significant for CVA in 2016 with residual L hemiplegia (on [name] [name] and [name] [name]), HTN, HLD.  1. GI Bleed/Acute Blood Loss Anemia: Pt with melenotic / heme + stools. On admission Hgb was 5.0, has required 5 units of pRBCs to date. Likely upper GI bleed given melena and elevated BUN 42>>23, but cannot [name]/o diverticular bleed (h/o constipation). No BRB. Patient had been on [name] [name] and [name] [name] after [name] [name] stent placement. Vital signs stable at this time.  - step down status. Watching closely.  - Two PIV lines in place - Hgb 5.0>4units> 7.9 > 7.1 > 1unit> 8.2>8.5.   - H&H stable  - Trending hgb every 8 hours.  - PT/INR 16.6/1.33 on 10/02  - GI on board  - EGD 10/04  - PO protonix   - NPO after MN - Holding [name] [name] and [name] [name]; discussed w/ neuro >> will restart as soon as safe  - Cardiac monitoring  2. AMS: Likely 2/2 acute blood loss anemia from GI bleed vs. Uremia; patient has neuro deficits at baseline 2/2 recent h/o stroke, but was noted to have increased lethargy and confusion by OP caretakers. [name] [name] in ED with no acute process. Pt sleepy but arousable. Oriented to self, person, month and place. - Transfusing with goal of Hgb >7- 8.0 due to h/o CVA - Neuro checks by nursing - Strict bed rest  3. Hx of CVA: Occurred in June 2016. Had a stent placed in the [name] [name]. Pt with residual L hemiplegia. Has been on [name] [name] and [name] [name]. Deficits appreciated on PE, these are believed to be at baseline  since the CVA. - Will hold [name] [name] and [name] [name] in the setting of acute GI bleed - Continue Atorvastatin and fenofibrate - Curb-sided Neuro about stent, see above  4. HCAP: being treated for HCAP at [name] [name]. [name] [name] showing persistent asymmetric [name] lower lung airspace opacity, suspicious for pneumonia. Was on [name] [name] x 10 days, discharged on 10 day course of [name] [name]. Finished course on 10/02.  5. HTN: - Holding home meds in the setting of acute GI bleed: Hydralazine, HCTZ, Lisinopril - Will restart if hypertensive  6. HLD: - Continue home med: Lipitor qd, Fenofibrate qd  6. Depression: Pt experienced depression after stroke - Continue home med: Prozac 20mg  qd  FEN/GI:  - NPO until GI clears him - SLP will be needed for swallow eval after EGD.  - MIVFs 144 cc / hr of D5NS while NPO.   Prophylaxis: SCDs in the setting of acute GI bleed  Disposition: stepdown. Pending GI rec for GI bleeding (Possible EGD). Neuro rec for antiplatelet.   Subjective:  Appears sleepy but easily arousable. Able to state his full name, month, year, place and president but not date and day.Denies pain, shortness of breath or chest pain. He denies nausea, vomiting, epigastric pain. He denies fever or chills.   Objective: Temp:  [97.3 F (36.3 C)-99.1 F (37.3 C)] 99.1 F (37.3 C) (10/03 0309) Pulse Rate:  [66-74] 67 (10/02  2300) Resp:  [19-24] 24 (10/02 2300) BP: (116-152)/(68-93) 133/73 mmHg (10/02 2300) SpO2:  [98 %-99 %] 99 % (10/02 2300)  Physical Exam: Gen: sleepy but easily arousable, NAD Eyes: PERRLA, sclera anicteric Oropharynx: clear, moist CV: RRR. S1 & S2 audible, 2+ radial and DP pulses bilaterally. Resp: no apparent WOB, CTAB. Abd: +BS. Soft, NDNT, no rebound or guarding.  Ext: No edema or gross deformities. Neuro: Alert and oriented, No gross focal deficits  Laboratory:  Recent Labs Lab 08/10/15 1426 08/10/15 2151 08/11/15 0529  WBC 8.6 7.7 7.4  HGB 8.4* 8.2* 8.5*  HCT 25.7* 25.1*  26.7*  PLT 271 268 244    Recent Labs Lab 08/08/15 1448 08/09/15 0627 08/10/15 0235 08/11/15 0529  NA 140 141 142 142  K 3.6 3.7 3.7 3.9  CL 107 109 109 111  CO2 25 23 24 25   BUN 43* 34* 23* 12  CREATININE 1.11 0.84 0.92 0.85  CALCIUM 8.5* 8.1* 8.4* 8.4*  PROT 6.6  --   --   --   BILITOT 0.6  --   --   --   ALKPHOS 144*  --   --   --   ALT 33  --   --   --   AST 31  --   --   --   GLUCOSE 126* 119* 111* 125*   INR/Prothrombin Time 1.33/17 Lactic Acid: 1.12  Imaging/Diagnostic Tests: [name] [name] 9/30 IMPRESSION: Prior RIGHT [name] territory infarct. Small vessel chronic ischemic changes of deep cerebral white matter. No acute intracranial abnormalities. Sphenoid sinus opacification.  [name] Riding, MD 08/11/2015, 7:25 AM PGY-1, [place] Intern pager: [phone], text pages welcome

## 2015-08-11 NOTE — Care Management Note (Signed)
Case Management Note  Patient Details  Name: [name] [name] MRN: [id number] Date of Birth: 13-Apr-1961  Subjective/Objective:                 Admitted with  symptomatic anemia 2/2 GI bleed, hx of CVA in 2016 with residual L hemiplegia (on ASA and Plavix), HTN, HLD.   Action/Plan: Return to SNF when medically stable. CM to f/u with d/c disposition.  Expected Discharge Date:                  Expected Discharge Plan:  [place] ([facility])  In-House Referral:  Clinical Social Work  Discharge planning Services  CM Consult  Post Acute Care Choice:    Choice offered to:     DME Arranged:    DME Agency:     HH Arranged:    [place] Agency:     Status of Service:  In process, will continue to follow  Medicare Important Message Given:    Date Medicare IM Given:    Medicare IM give by:    Date Additional Medicare IM Given:    Additional Medicare Important Message give by:     If discussed at [place] of Stay Meetings, dates discussed:    Additional Comments: [name] [name] ([id number])[phone], [name] [name] (Other) [phone]  [name] [name] [place], RN,BSN,CM  08/11/2015, 9:22 AM

## 2015-08-12 ENCOUNTER — Encounter (HOSPITAL_COMMUNITY): Payer: Self-pay

## 2015-08-12 ENCOUNTER — Inpatient Hospital Stay (HOSPITAL_COMMUNITY): Payer: Medicare Other

## 2015-08-12 ENCOUNTER — Encounter (HOSPITAL_COMMUNITY)
Admission: EM | Disposition: A | Discharge status: Skilled Nursing Facility | Payer: Self-pay | Source: Home / Self Care | Attending: Family Medicine

## 2015-08-12 DIAGNOSIS — K921 Melena: Secondary | ICD-10-CM | POA: Insufficient documentation

## 2015-08-12 HISTORY — PX: ESOPHAGOGASTRODUODENOSCOPY: SHX5428

## 2015-08-12 LAB — BASIC METABOLIC PANEL
ANION GAP: 11 (ref 5–15)
BUN: 7 mg/dL (ref 6–20)
CHLORIDE: 111 mmol/L (ref 101–111)
CO2: 21 mmol/L — AB (ref 22–32)
Calcium: 7 mg/dL — ABNORMAL LOW (ref 8.9–10.3)
Creatinine, Ser: 0.78 mg/dL (ref 0.61–1.24)
GFR calc non Af Amer: >60 mL/min (ref >60)
Glucose, Bld: 100 mg/dL — ABNORMAL HIGH (ref 65–99)
POTASSIUM: 4.3 mmol/L (ref 3.5–5.1)
SODIUM: 143 mmol/L (ref 135–145)

## 2015-08-12 LAB — TYPE AND SCREEN
ABO/RH(D): A POS
Antibody Screen: NEGATIVE
UNIT DIVISION: 0
UNIT DIVISION: 0
UNIT DIVISION: 0
Unit division: 0
Unit division: 0
Unit division: 0
Unit division: 0

## 2015-08-12 LAB — GLUCOSE, CAPILLARY: Glucose-Capillary: 103 mg/dL — ABNORMAL HIGH (ref 65–99)

## 2015-08-12 LAB — CBC
HEMATOCRIT: 25.2 % — AB (ref 39.0–52.0)
HEMATOCRIT: 28.2 % — AB (ref 39.0–52.0)
HEMOGLOBIN: 8.4 g/dL — AB (ref 13.0–17.0)
HEMOGLOBIN: 9.3 g/dL — AB (ref 13.0–17.0)
MCH: 28.4 pg (ref 26.0–34.0)
MCH: 28.7 pg (ref 26.0–34.0)
MCHC: 33.3 g/dL (ref 30.0–36.0)
MCHC: 33 g/dL (ref 30.0–36.0)
MCV: 85.1 fL (ref 78.0–100.0)
MCV: 87 fL (ref 78.0–100.0)
PLATELETS: 198 10*3/uL (ref 150–400)
Platelets: 258 10*3/uL (ref 150–400)
RBC: 2.96 MIL/uL — AB (ref 4.22–5.81)
RBC: 3.24 MIL/uL — AB (ref 4.22–5.81)
RDW: 16.5 % — ABNORMAL HIGH (ref 11.5–15.5)
RDW: 16 % — ABNORMAL HIGH (ref 11.5–15.5)
WBC: 8.1 10*3/uL (ref 4.0–10.5)
WBC: 8.1 10*3/uL (ref 4.0–10.5)

## 2015-08-12 SURGERY — EGD (ESOPHAGOGASTRODUODENOSCOPY)
Anesthesia: Moderate Sedation

## 2015-08-12 MED ORDER — RESOURCE THICKENUP CLEAR PO POWD
ORAL | Status: DC | PRN
  Filled 2015-08-12 (×2): qty 125

## 2015-08-12 MED ORDER — HYDROCHLOROTHIAZIDE 25 MG PO TABS
25.0000 mg | ORAL_TABLET | Freq: Every day | ORAL | Status: DC
  Administered 2015-08-12 – 2015-08-14 (×3): 25 mg via ORAL
  Filled 2015-08-12 (×3): qty 1

## 2015-08-12 MED ORDER — FENTANYL CITRATE (PF) 100 MCG/2ML IJ SOLN
INTRAMUSCULAR | Status: DC | PRN
  Administered 2015-08-12 (×2): 25 ug via INTRAVENOUS

## 2015-08-12 MED ORDER — PANTOPRAZOLE SODIUM 40 MG PO TBEC
40.0000 mg | DELAYED_RELEASE_TABLET | Freq: Two times a day (BID) | ORAL | Status: DC
  Administered 2015-08-12 – 2015-08-14 (×5): 40 mg via ORAL
  Filled 2015-08-12 (×5): qty 1

## 2015-08-12 MED ORDER — MIDAZOLAM HCL 5 MG/ML IJ SOLN
INTRAMUSCULAR | Status: AC
  Filled 2015-08-12: qty 2

## 2015-08-12 MED ORDER — CLOPIDOGREL BISULFATE 75 MG PO TABS
75.0000 mg | ORAL_TABLET | Freq: Every day | ORAL | Status: DC
  Administered 2015-08-12 – 2015-08-14 (×3): 75 mg via ORAL
  Filled 2015-08-12 (×3): qty 1

## 2015-08-12 MED ORDER — STARCH (THICKENING) PO POWD
ORAL | Status: DC | PRN
  Filled 2015-08-12: qty 227

## 2015-08-12 MED ORDER — CHLORHEXIDINE GLUCONATE 0.12 % MT SOLN
15.0000 mL | Freq: Two times a day (BID) | OROMUCOSAL | Status: DC
  Administered 2015-08-12 – 2015-08-14 (×4): 15 mL via OROMUCOSAL
  Filled 2015-08-12 (×6): qty 15

## 2015-08-12 MED ORDER — HYDRALAZINE HCL 10 MG PO TABS
10.0000 mg | ORAL_TABLET | Freq: Three times a day (TID) | ORAL | Status: DC
  Administered 2015-08-12 – 2015-08-14 (×8): 10 mg via ORAL
  Filled 2015-08-12 (×10): qty 1

## 2015-08-12 MED ORDER — MIDAZOLAM HCL 10 MG/2ML IJ SOLN
INTRAMUSCULAR | Status: DC | PRN
  Administered 2015-08-12: 2 mg via INTRAVENOUS

## 2015-08-12 MED ORDER — SODIUM CHLORIDE 0.9 % IV SOLN
INTRAVENOUS | Status: DC
  Administered 2015-08-12: 500 mL via INTRAVENOUS

## 2015-08-12 MED ORDER — BUTAMBEN-TETRACAINE-BENZOCAINE 2-2-14 % EX AERO
INHALATION_SPRAY | CUTANEOUS | Status: DC | PRN
  Administered 2015-08-12: 1 via TOPICAL

## 2015-08-12 MED ORDER — HYDROCHLOROTHIAZIDE 25 MG PO TABS: 25.0000 mg | ORAL_TABLET | Freq: Every day | ORAL | Status: DC

## 2015-08-12 MED ORDER — CETYLPYRIDINIUM CHLORIDE 0.05 % MT LIQD
7.0000 mL | Freq: Two times a day (BID) | OROMUCOSAL | Status: DC
  Administered 2015-08-13 – 2015-08-14 (×3): 7 mL via OROMUCOSAL

## 2015-08-12 MED ORDER — ASPIRIN 325 MG PO TABS
325.0000 mg | ORAL_TABLET | Freq: Every day | ORAL | Status: DC
  Administered 2015-08-13 – 2015-08-14 (×2): 325 mg via ORAL
  Filled 2015-08-12 (×4): qty 1

## 2015-08-12 MED ORDER — FENTANYL CITRATE (PF) 100 MCG/2ML IJ SOLN
INTRAMUSCULAR | Status: AC
  Filled 2015-08-12: qty 2

## 2015-08-12 NOTE — Progress Notes (Addendum)
Physician notified: intern At: 1150  Regarding: Pt returned from EGD-erosive gastritis, GI s/o. GI ordered diet. SLP requesting barium swallow, OK to give AM meds? Thanks.  Cyndia Skeeters, MD at bedside.   Order(s): hold diet, await for SLP to see patient. This RN will contact SLP. Will continue to monitor.

## 2015-08-12 NOTE — Evaluation (Signed)
Occupational Therapy Evaluation Patient Details Name: [name] [name] MRN: [id number] DOB: January 23, 1961 Today's Date: 08/12/2015    History of Present Illness Pt is a 54 y.o. male presented to ED 9/30 with GI bleed. Recently admitted for CVA with residual L hemiplegia.   Clinical Impression   Pt admitted with above. He demonstrates the below listed deficits and will benefit from continued OT to maximize safety and independence with BADLs.  Pt presents to OT with Lt hemiparesis, Lt inattention/neglect, cognitive impairment, impaired balance.  He requires mod - max A for ADLs.   Recommend return to SNF for rehab at discharge.  Will follow acutely.       Follow Up Recommendations  SNF;Supervision/Assistance - 24 hour    Equipment Recommendations  None recommended by OT    Recommendations for Other Services       Precautions / Restrictions Precautions Precautions: Fall Precaution Comments: left hemiplegia UE>LE      Mobility Bed Mobility Overal bed mobility: Needs Assistance Bed Mobility: Supine to Sit;Sit to Supine     Supine to sit: Mod assist Sit to supine: Min assist   General bed mobility comments: Assist to move LEs off EOB.  Pt frequently will lift LEs back on bed once they are moved off of bed   Transfers                 General transfer comment: did not attempt due to transport arrived to take pt to [facility]BS    Balance Overall balance assessment: Needs assistance Sitting-balance support: Feet supported Sitting balance-Leahy Scale: Fair                                      ADL Overall ADL's : Needs assistance/impaired Eating/Feeding: NPO   Grooming: Wash/dry hands;Wash/dry face;Oral care;Brushing hair;Moderate assistance;Sitting   Upper Body Bathing: Maximal assistance;Sitting   Lower Body Bathing: Maximal assistance;Sitting/lateral leans;Bed level   Upper Body Dressing : Maximal assistance;Sitting   Lower Body Dressing: Maximal  assistance;Sitting/lateral leans;Bed level     Toilet Transfer Details (indicate cue type and reason): did not attempt due to pt transported for [facility] Toileting- Clothing Manipulation and Hygiene: Total assistance;Bed level       Functional mobility during ADLs: Moderate assistance;Minimal assistance (bed mobility )       Vision Vision Assessment?: Yes Eye Alignment: Within Functional Limits Ocular Range of Motion: Within Functional Limits Tracking/Visual Pursuits: Other (comment) Visual Fields: Left homonymous hemianopsia Additional Comments: Pt loses fixation on object frequently in Rt visual fields, and has to initiate a saccade to relocate object.  He does not track object to Lt, but will look to Lt when cued.   Visual field testing yielded inconsistent responses, but appears to have Lt HH,however, this is difficult to accurately determine due to Lt inattention/neglect    Perception Perception Perception Tested?: Yes Perception Deficits: Inattention/neglect Inattention/Neglect: Does not attend to left visual field;Does not attend to left side of body Spatial deficits: Lt intattention/neglect    Praxis Praxis Praxis tested?: Deficits Deficits: Initiation;Motor Impersistence;Organization    Pertinent Vitals/Pain Pain Assessment: Faces Faces Pain Scale: Hurts little more Pain Location: ? Lt elbow area with ROM attempts  Pain Descriptors / Indicators: Grimacing Pain Intervention(s): Monitored during session;Limited activity within patient's tolerance     Hand Dominance Right   Extremity/Trunk Assessment Upper Extremity Assessment Upper Extremity Assessment: LUE deficits/detail LUE Deficits / Details: Pt with edema  Lt hand.  Little AROM Lt hand.  He demonstrates active shoulder flexion ~70* with max cues, and ~ 35* elbow flexion actively.  LUE Coordination: decreased fine motor;decreased gross motor   Lower Extremity Assessment Lower Extremity Assessment: Defer to PT  evaluation       Communication Communication Communication: Expressive difficulties;Other (comment) (very slow to respond with minimal verbalization)   Cognition Arousal/Alertness: Awake/alert Behavior During Therapy: Flat affect Overall Cognitive Status: Impaired/Different from baseline Area of Impairment: Orientation;Attention;Following commands;Safety/judgement;Awareness;Problem solving Orientation Level: Disoriented to;Time;Situation Current Attention Level: Sustained (with cues ) Memory: Decreased short-term memory Following Commands: Follows one step commands consistently (with verbal cues ) Safety/Judgement: Decreased awareness of deficits;Decreased awareness of safety Awareness: Intellectual Problem Solving: Slow processing;Decreased initiation;Difficulty sequencing;Requires verbal cues General Comments: Pt is very slow to initiate activity.   Decreased vigilance.  Pt oriented to time and situation only with cuing.  He demonstrates poor problem solving and decreased awareness of deficits as well as Lt inattention/neglect    General Comments       Exercises       Shoulder Instructions      Home Living Family/patient expects to be discharged to:: Skilled nursing facility                                        Prior Functioning/Environment Level of Independence: Needs assistance  Gait / Transfers Assistance Needed: pt poor historian, but reports was walking with cane in therapy, used wheelchair mainly and could toilet himself ADL's / Homemaking Assistance Needed: Pt indicates staff at SNF assist him with ADLs   Comments: Pt reports prior to CVA, he lived with his daughter and was independent with ADLs     OT Diagnosis: Generalized weakness;Cognitive deficits;Disturbance of vision;Hemiplegia non-dominant side   OT Problem List: Decreased strength;Decreased activity tolerance;Impaired balance (sitting and/or standing);Impaired vision/perception;Decreased  coordination;Decreased cognition;Decreased safety awareness;Decreased knowledge of use of DME or AE;Impaired sensation;Impaired UE functional use   OT Treatment/Interventions: Self-care/ADL training;Neuromuscular education;DME and/or AE instruction;Therapeutic activities;Cognitive remediation/compensation;Visual/perceptual remediation/compensation;Patient/family education;Balance training    OT Goals(Current goals can be found in the care plan section) Acute Rehab OT Goals Patient Stated Goal: Pt did not state  OT Goal Formulation: With patient Time For Goal Achievement: 08/26/15 Potential to Achieve Goals: Good ADL Goals Pt Will Perform Grooming: with min assist;sitting Pt Will Perform Upper Body Bathing: with min assist;sitting Pt Will Transfer to Toilet: with mod assist;stand pivot transfer;bedside commode Pt Will Perform Toileting - Clothing Manipulation and hygiene: with mod assist;sit to/from stand Additional ADL Goal #1: Pt will locate ADL items on Lt with min verbal cues   OT Frequency: Min 2X/week   Barriers to D/C: Decreased caregiver support          Co-evaluation              End of Session Nurse Communication: Mobility status  Activity Tolerance: Patient tolerated treatment well Patient left: in bed;with call bell/phone within reach;Other (comment) (Radiology transport)   Time: [phone] OT Time Calculation (min): 20 min Charges:  OT General Charges $OT Visit: 1 Procedure OT Evaluation $Initial OT Evaluation Tier I: 1 Procedure G-Codes:    Mirra Basilio M 2015-09-02, 1:17 PM

## 2015-08-12 NOTE — Op Note (Addendum)
[facility] [place], [address]   ENDOSCOPY PROCEDURE REPORT  PATIENT: [name] [name], [name] [name]  MR#: [id number] BIRTHDATE: 1961-06-18 , 54  yrs. old GENDER: male ENDOSCOPIST: Ladene Artist, MD, [facility] REFERRED BY:  [facility] PROCEDURE DATE:  08/12/2015 PROCEDURE:  EGD, diagnostic ASA CLASS:     Class III INDICATIONS:  melena. MEDICATIONS: Fentanyl 50 mcg IV and Versed 4 mg IV TOPICAL ANESTHETIC: Cetacaine Spray DESCRIPTION OF PROCEDURE: After the risks benefits and alternatives of the procedure were thoroughly explained, informed consent was obtained.  The Pentax Gastroscope [id number] endoscope was introduced through the mouth and advanced to the second portion of the duodenum , Without limitations.  The instrument was slowly withdrawn as the mucosa was fully examined.    ESOPHAGUS: The mucosa of the esophagus appeared normal. STOMACH: Moderate erosive gastritis was found in the gastric antrum. Short linear defect in the gastric body c/w prior G tube site. The stomach otherwise appeared normal. DUODENUM: Moderate nonerosive duodenitis was found in the duodenal bulb.  The duodenal mucosa showed no abnormalities in the 2nd part of the duodenum. Retroflexed views revealed no abnormalities. The scope was then withdrawn from the patient and the procedure completed.  COMPLICATIONS: There were no immediate complications.  ENDOSCOPIC IMPRESSION: 1.   Erosive gastritis in the gastric antrum 2.   Nonerosive duodenitis in the duodenal bulb 3.   Prior G tube site in gastric body  RECOMMENDATIONS: 1.  Continue PPI bid for 4 weeks then PPI qam long term 2.  OK resume Plavix and ASA today-ordered 3.  Check H pylori Ab, treat if positive 4.  GI signing off  [R  eSigned:  Ladene Artist, MD, [facility] 08/12/2015 10:44 AM Revised: 08/12/2015 10:44 AM

## 2015-08-12 NOTE — Care Management Note (Signed)
Case Management Note  Patient Details  Name: [name] [name] MRN: [id number] Date of Birth: 1961/07/05  Subjective/Objective:                 Admitted from SNF with GI bleed, hx of  CVA in 2016 with residual L hemiplegia (on ASA and Plavix), HTN, HLD.   Action/Plan: Return to SNF when medically stable. CM to f/u with d/c disposition.  Expected Discharge Date:                  Expected Discharge Plan:  [place] ([facility])  In-House Referral:  Clinical Social Work  Discharge planning Services  CM Consult  Post Acute Care Choice:    Choice offered to:     DME Arranged:    DME Agency:     HH Arranged:    [place] Agency:     Status of Service:    Medicare Important Message Given:   Date Medicare IM Given:    Medicare IM give by:    Date Additional Medicare IM Given:    Additional Medicare Important Message give by:     If discussed at [place] of Stay Meetings, dates discussed:    Additional Comments: Janann Colonel (Daughter) [phone], Dorsie Burich (Other) [phone]  Whitman Hero [place], [place] [phone] 08/12/2015, 7:57 PM

## 2015-08-12 NOTE — H&P (View-Only) (Signed)
Daily Rounding Note  08/11/2015, 8:39 AM  LOS: 3 days   SUBJECTIVE:       One large black stool yesterday AM.  Nothing since.  No abd pain.  Nods yes to nausea.  NPO except chips with meds  OBJECTIVE:         Vital signs in last 24 hours:    Temp:  [97.3 F (36.3 C)-99.1 F (37.3 C)] 98.2 F (36.8 C) (10/03 0825) Pulse Rate:  [62-74] 62 (10/03 0824) Resp:  [19-24] 22 (10/03 0824) BP: (116-152)/(67-93) 128/67 mmHg (10/03 0824) SpO2:  [98 %-99 %] 98 % (10/03 0824) Last BM Date: 08/10/15 Filed Weights   08/08/15 1408 08/09/15 0047  Weight: 230 lb (104.327 kg) 229 lb 11.5 oz (104.2 kg)   General: alert, comfortable, pale.   Heart: RRR Chest: clear bil.  No cough or labored breathing Abdomen: soft, NT, ND.  Active BS Rectal: no mass, black/rapidly 4+ FOBT + stool. GU: condom cath in place.  Urine sraw colored.   Extremities: edema in left arm and in left>right foot Neuro/Psych:  Cooperative, follows commands, severe dysarthria. Moves all 4 limbs but weakness in left UE/LE,   Intake/Output from previous day: 10/02 0701 - 10/03 0700 In: 582 [I.V.:582] Out: 750 [Urine:750]  Intake/Output this shift: Total I/O In: 288 [I.V.:288] Out: -   Lab Results:  Recent Labs  08/10/15 1426 08/10/15 2151 08/11/15 0529  WBC 8.6 7.7 7.4  HGB 8.4* 8.2* 8.5*  HCT 25.7* 25.1* 26.7*  PLT 271 268 244   BMET  Recent Labs  08/09/15 0627 08/10/15 0235 08/11/15 0529  NA 141 142 142  K 3.7 3.7 3.9  CL 109 109 111  CO2 23 24 25   GLUCOSE 119* 111* 125*  BUN 34* 23* 12  CREATININE 0.84 0.92 0.85  CALCIUM 8.1* 8.4* 8.4*   LFT  Recent Labs  08/08/15 1448  PROT 6.6  ALBUMIN 2.1*  AST 31  ALT 33  ALKPHOS 144*  BILITOT 0.6   PT/INR  Recent Labs  08/09/15 0627 08/10/15 1233  LABPROT 17.0* 16.6*  INR 1.37 1.33   Hepatitis Panel No results for input(s): HEPBSAG, HCVAB, HEPAIGM, HEPBIGM in the last 72  hours.  Studies/Results: No results found.   Scheduled Meds: . amantadine  200 mg Oral BID  . antiseptic oral rinse  7 mL Mouth Rinse BID  . atorvastatin  80 mg Oral q1800  . Chlorhexidine Gluconate Cloth  6 each Topical Q0600  . fenofibrate  54 mg Oral Daily  . FLUoxetine  20 mg Oral Daily  . mupirocin ointment  1 application Nasal BID  . pantoprazole (PROTONIX) IV  40 mg Intravenous Q12H  . sodium chloride  3 mL Intravenous Q12H   Continuous Infusions: . dextrose 5 % and 0.9% NaCl 144 mL/hr at 08/11/15 0800   PRN Meds:.acetaminophen **OR** acetaminophen   ASSESMENT:   *  Acute anemia with FOBT +/black stools.   S/p PRBCs x 5.  Last was early yesterday AM.   Remains on BID IV Protonix.   *  Neurogenic dysphagia. Last SLP MBSS was 9/15: "Moderate oral phase dysphagia; Mild pharyngeal phase dysphagia... SLP recommends continuation with current diet; fine chop and thin liquid with small sips/bites and intermittent second swallow with full supervision to assist with feeding and cuing for compensations.  *  HX CVAs, latest 04/2015.  sigificant neurologic impairment.    *  S/p unsuccesful 7/1 right MCA thrombectomy  but subsequent succesful stenting.  Chronic Plavix, on hold.  Last dose estimated 9/29.   *  HCAP, recurrent aspiration PNAs.  Recently completed course of Levaquin om 10/2.  *  CKD, improved c/w this summer at [facility]  BUN elevated to 43 at admission, now normal.     PLAN   *  Will set pt up for EGD tomorrow.   *  Will allow for D2 diet today, NPO post midnight.  *  Switch to po Protonix.    Azucena Freed  08/11/2015, 8:39 AM Pager: [phone]     Attending physician's note   I have taken an interval history, reviewed the chart and examined the patient. I agree with the Advanced Practitioner's note, impression and recommendations. EGD planned for tomorrow to evaluate melena and ABL anemia. Continue PPI. Plavix on hold. Follow SLP recommendations for neurogenic  dysphagia.   Lucio Edward, MD [place] [phone] Mon-Fri 8a-5p [phone] after 5p, weekends, holidays

## 2015-08-12 NOTE — Progress Notes (Signed)
Family Medicine Teaching Service Daily Progress Note Intern Pager: [phone]  Patient name: [name] [name] Medical record number: [id number] Date of birth: [birth year] Age: 54 y.o. Gender: male  Primary Care Provider: Gildardo Cranker, DO Consultants: GI Code Status: Full  Pt Overview and Major Events to Date:  9/30: Admitted for symptomatic anemia 2/2 GI bleed 10/1: Transfused 3rd unit of PRBCs  Assessment and Plan: [name] [name] is a 54 y.o. male presenting with AMS who was found to have a significant GI bleed. PMH is significant for CVA in 2016 with residual L hemiplegia (on ASA and Plavix), HTN, HLD.  GI Bleed/Acute Blood Loss Anemia: Pt with melenotic / heme + stools. On admission Hgb was 5.0, has required 5 units of pRBCs to date. Likely upper GI bleed given melena and elevated BUN 42>>23, but cannot r/o diverticular bleed (h/o constipation). No BRB. Patient had been on Plavix and ASA after RMCA stent placement. Vital signs stable at this time.  - step down status. Watching closely.  - Two PIV lines in place - Hgb 5.0>4units> 7.9 > 7.1 > 1unit> 8.2>8.5>8.4.   - H&H stable  - Trending hgb every 8 hours.  - PT/INR 16.6/1.33 on 10/02  - EGD on 10/04 positive for erosive gastritis. - GI recs:  - PPI bid for 4 weeks then PPI qam long term  - resume Plavix and ASA today-ordered  - Check H pylori Ab, treat if positive - Cardiac monitoring  AMS: Likely 2/2 acute blood loss anemia from GI bleed vs. Uremia; patient has neuro deficits at baseline 2/2 recent h/o stroke, but was noted to have increased lethargy and confusion by OP caretakers. CT head in ED with no acute process. Pt sleepy but arousable. Oriented to self, person, month and place. - Transfusing with goal of Hgb >7- 8 due to h/o CVA - Neuro checks by nursing - Strict bed rest  HTN: BP up to 161/100 after introducing lisinopril yesterday. - Restart his home hydralazine and HCTZ  - Continue home lisinopril  Hx of CVA: Occurred  in June 2016. Had a stent placed in the R MCA. Pt with residual L hemiplegia. Has been on ASA and Plavix. Deficits appreciated on PE, these are believed to be at baseline since the CVA. - Will hold ASA and Plavix in the setting of acute GI bleed - Continue Atorvastatin and fenofibrate - PTx3/wk - SLP eval before diet order  HLD: - Continue home Lipitor qd & Fenofibrate qd  Depression: Pt experienced depression after stroke - Continue home med: Prozac 20mg  qd  HCAP: being treated for HCAP at [facility]. CXR showing persistent asymmetric R lower lung airspace opacity, suspicious for pneumonia. Was on Levaquin x 10 days, discharged on 10 day course of Moxifloxacin. Finished course on 10/02.  FEN/GI:  - NPO until GI clears him - SLP will be needed for swallow eval after EGD.  - MIVFs 144 cc / hr of D5NS while NPO.   Prophylaxis: SCDs in the setting of acute GI bleed  Disposition: watch in stepdown with plavix and ASA today. Will monitor in stepdown for today.  Subjective:  Saw patient after he came back from EGD. Reports some abdominal pain but denies SOB & chest pain. Says he is hungry and wants some food.   Objective: Temp:  [98.2 F (36.8 C)-99.1 F (37.3 C)] 98.9 F (37.2 C) (10/04 0252) Pulse Rate:  [58-70] 70 (10/04 0250) Resp:  [18-23] 18 (10/04 0250) BP: (128-172)/(67-100) 161/100 mmHg (10/04 0250)  SpO2:  [96 %-99 %] 99 % (10/04 0250)  Physical Exam: Gen: awake, NAD Eyes: PERRLA, sclera anicteric Oropharynx: clear, moist CV: RRR. S1 & S2 audible, 2+ radial and DP pulses bilaterally. Resp: no apparent WOB, CTAB. Abd: +BS. Soft, slightly tender to palpation, no rebound or guarding.  Ext: No edema or gross deformities. Neuro: Alert and oriented, No gross focal deficits  Laboratory:  Recent Labs Lab 08/11/15 0529 08/11/15 1352 08/12/15 0432  WBC 7.4 7.9 PENDING  HGB 8.5* 8.3* 8.4*  HCT 26.7* 25.5* 25.2*  PLT 244 244 PENDING    Recent Labs Lab 08/08/15 1448   08/10/15 0235 08/11/15 0529 08/12/15 0432  NA 140  < > 142 142 143  K 3.6  < > 3.7 3.9 4.3  CL 107  < > 109 111 111  CO2 25  < > 24 25 21*  BUN 43*  < > 23* 12 7  CREATININE 1.11  < > 0.92 0.85 0.78  CALCIUM 8.5*  < > 8.4* 8.4* 7.0*  PROT 6.6  --   --   --   --   BILITOT 0.6  --   --   --   --   ALKPHOS 144*  --   --   --   --   ALT 33  --   --   --   --   AST 31  --   --   --   --   GLUCOSE 126*  < > 111* 125* 100*  < > = values in this interval not displayed. INR/Prothrombin Time 1.33/17 Lactic Acid: 1.12  Imaging/Diagnostic Tests: CT Head 9/30 IMPRESSION: Prior RIGHT MCA territory infarct. Small vessel chronic ischemic changes of deep cerebral white matter. No acute intracranial abnormalities. Sphenoid sinus opacification.  Mercy Riding, MD 08/12/2015, 7:03 AM PGY-1, [place] Intern pager: [phone], text pages welcome

## 2015-08-12 NOTE — Clinical Social Work Note (Addendum)
CSW left a voice message for the pt's daughter regarding discharge plan. CSW awaiting a call back.   Addendum: FL2 on chart please sign.   [place], MSW, [place]

## 2015-08-12 NOTE — Care Management Important Message (Signed)
Important Message  Patient Details  Name: [name] [name] MRN: [id number] Date of Birth: [birth year]   Medicare Important Message Given:  Yes-second notification given    Nathen May 08/12/2015, 2:00 PM

## 2015-08-12 NOTE — Interval H&P Note (Signed)
History and Physical Interval Note:  08/12/2015 9:21 AM  [name] [name]  has presented today for surgery, with the diagnosis of black/FOBT+ stools, acute anemia.  The various methods of treatment have been discussed with the patient and family. After consideration of risks, benefits and other options for treatment, the patient has consented to  Procedure(s): ESOPHAGOGASTRODUODENOSCOPY (EGD) (N/A) as a surgical intervention .  The patient's history has been reviewed, patient examined, no change in status, stable for surgery.  I have reviewed the patient's chart and labs.  Questions were answered to the patient's satisfaction.     Pricilla Riffle. [place] Plan

## 2015-08-13 ENCOUNTER — Encounter (HOSPITAL_COMMUNITY): Payer: Self-pay | Admitting: Gastroenterology

## 2015-08-13 DIAGNOSIS — E876 Hypokalemia: Secondary | ICD-10-CM | POA: Insufficient documentation

## 2015-08-13 LAB — BASIC METABOLIC PANEL
ANION GAP: 8 (ref 5–15)
ANION GAP: 9 (ref 5–15)
BUN: 5 mg/dL — ABNORMAL LOW (ref 6–20)
BUN: <5 mg/dL — ABNORMAL LOW (ref 6–20)
CALCIUM: 8.1 mg/dL — AB (ref 8.9–10.3)
CHLORIDE: 105 mmol/L (ref 101–111)
CO2: 25 mmol/L (ref 22–32)
CO2: 25 mmol/L (ref 22–32)
CREATININE: 0.79 mg/dL (ref 0.61–1.24)
Calcium: 7.9 mg/dL — ABNORMAL LOW (ref 8.9–10.3)
Chloride: 104 mmol/L (ref 101–111)
Creatinine, Ser: 0.79 mg/dL (ref 0.61–1.24)
GFR calc non Af Amer: >60 mL/min (ref >60)
GLUCOSE: 86 mg/dL (ref 65–99)
Glucose, Bld: 101 mg/dL — ABNORMAL HIGH (ref 65–99)
POTASSIUM: 2.9 mmol/L — AB (ref 3.5–5.1)
POTASSIUM: 3.2 mmol/L — AB (ref 3.5–5.1)
SODIUM: 138 mmol/L (ref 135–145)
Sodium: 138 mmol/L (ref 135–145)

## 2015-08-13 LAB — H PYLORI, IGM, IGG, IGA AB: H PYLORI IGG: 3.4 U/mL — AB (ref 0.0–0.8)

## 2015-08-13 LAB — CBC
HEMATOCRIT: 23.1 % — AB (ref 39.0–52.0)
HEMATOCRIT: 25.7 % — AB (ref 39.0–52.0)
HEMOGLOBIN: 7.8 g/dL — AB (ref 13.0–17.0)
Hemoglobin: 8.5 g/dL — ABNORMAL LOW (ref 13.0–17.0)
MCH: 29 pg (ref 26.0–34.0)
MCH: 28.4 pg (ref 26.0–34.0)
MCHC: 33.8 g/dL (ref 30.0–36.0)
MCHC: 33.1 g/dL (ref 30.0–36.0)
MCV: 85.9 fL (ref 78.0–100.0)
MCV: 86 fL (ref 78.0–100.0)
Platelets: 213 10*3/uL (ref 150–400)
Platelets: 224 10*3/uL (ref 150–400)
RBC: 2.69 MIL/uL — AB (ref 4.22–5.81)
RBC: 2.99 MIL/uL — AB (ref 4.22–5.81)
RDW: 15.9 % — ABNORMAL HIGH (ref 11.5–15.5)
RDW: 15.7 % — AB (ref 11.5–15.5)
WBC: 6.3 10*3/uL (ref 4.0–10.5)
WBC: 7.3 10*3/uL (ref 4.0–10.5)

## 2015-08-13 LAB — GLUCOSE, CAPILLARY: Glucose-Capillary: 103 mg/dL — ABNORMAL HIGH (ref 65–99)

## 2015-08-13 MED ORDER — POTASSIUM CHLORIDE CRYS ER 20 MEQ PO TBCR
40.0000 meq | EXTENDED_RELEASE_TABLET | Freq: Two times a day (BID) | ORAL | Status: AC
  Administered 2015-08-13 – 2015-08-14 (×3): 40 meq via ORAL
  Filled 2015-08-13 (×3): qty 2

## 2015-08-13 NOTE — Progress Notes (Signed)
Family Medicine Teaching Service Daily Progress Note Intern Pager: [phone]  Patient name: [name] [name] Medical record number: [id number] Date of birth: [birth year] Age: 54 y.o. Gender: male  Primary Care Provider: Gildardo Cranker, DO Consultants: GI Code Status: Full  Pt Overview and Major Events to Date:  9/30: Admitted for symptomatic anemia 2/2 GI bleed 10/1: Transfused 3rd unit of PRBCs 10/02: transfused 4th and 5th units of blood 10/04: EGD with erosive gastritis 10/05: transferred to floor  Assessment and Plan: [name] [name] is a 54 y.o. male presenting with AMS who was found to have a significant GI bleed. PMH is significant for CVA in 2016 with residual L hemiplegia (on ASA and Plavix), HTN, HLD.  GI Bleed/Acute Blood Loss Anemia: Pt with melenotic / heme + stools. On admission Hgb was 5.0, has required 5 units of pRBCs to date. Likely upper GI bleed given melena and elevated BUN 42>>23, but cannot r/o diverticular bleed (h/o constipation). No BRB. Patient had been on Plavix and ASA after RMCA stent placement. Vital signs stable at this time.  - transferred to floor 10/05 - Two PIV lines in place - Hgb 5.0>4units> 7.9 > 7.1 > 1unit> 8.2>8.5>8.4>7.8.   - H&H stable  - Trending hgb every 12 hours.  - PT/INR 16.6/1.33 on 10/02  - EGD on 10/04 positive for erosive gastritis. - GI recs:  - PPI bid for 4 weeks then PPI qam long term  - resume Plavix and ASA 10/04  - f/u H pylori Ab, treat if positive - Cardiac monitoring  AMS: Likely 2/2 acute blood loss anemia from GI bleed vs. Uremia; patient has neuro deficits at baseline 2/2 recent h/o stroke, but was noted to have increased lethargy and confusion by OP caretakers. CT head in ED with no acute process. Pt sleepy but arousable. Oriented to self, person, month and place. - Neuro checks by nursing - Strict bed rest  Hypokalemia: K down to 2.9 this AM. -KCl PO 40 mEq bid (total of three doses) -Bmet in AM  HTN: BP up to  150/88 this AM - Continue home lisinopril, hydralazine and HCTZ  - Monitor BP  Hx of CVA: Occurred in June 2016. Had a stent placed in the R MCA. Pt with residual L hemiplegia. Has been on ASA and Plavix. Deficits appreciated on PE, these are believed to be at baseline since the CVA. - Resumed Plavix and ASA - Continue Atorvastatin and fenofibrate - PTx3/wk, SNF - OT SNF with 24 hrs supervision - SLP barrium swallow: Dysphagia-2  -thickner  -crushed pills with puree  -Speech therapy x2/wk  HLD: - Continue home Lipitor qd & Fenofibrate qd  Depression: Pt experienced depression after stroke - Continue home med: Prozac 20mg  qd  HCAP: being [facility] for HCAP at [facility]. CXR showing persistent asymmetric R lower lung airspace opacity, suspicious for pneumonia. Was on Levaquin x 10 days, discharged on 10 day course of Moxifloxacin. Finished course on 10/02.  FEN/GI:  - [place] as needed   Prophylaxis: SCDs in the setting of acute GI bleed. Also on Plavix and ASA  Disposition: transferred to floor today. D/cd fluid, foley & strict bed rest orders. Awaiting SNF placement. F/u with SW and CM  Subjective:  Patient had SLP eval yesterday. Denies pain overnight. Denies SOB or chest pain. He reports sleeping well. Report having BM yesterday in the morning. No sure about the look of BM. Has urinary cath in place.   Objective: Temp:  [  98.5 F (36.9 C)-99.1 F (37.3 C)] 98.9 F (37.2 C) (10/05 0425) Pulse Rate:  [54-68] 56 (10/05 0425) Resp:  [12-29] 16 (10/05 0425) BP: (146-179)/(85-107) 150/88 mmHg (10/05 0425) SpO2:  [94 %-100 %] 96 % (10/05 0425)  Physical Exam: Gen: awake, NAD Eyes: PERRLA, sclera anicteric Oropharynx: clear, moist CV: RRR. S1 & S2 audible, 2+ radial and DP pulses bilaterally. Resp: no apparent WOB, CTAB. Abd: +BS. Soft, slightly tender to palpation, no rebound or guarding.  Ext: No edema or gross deformities. Neuro: Alert and oriented, left  sided ext weakness 2/5 in LUE, 4/5 in LLE  Laboratory:  Recent Labs Lab 08/12/15 0432 08/12/15 1555 08/13/15 0329  WBC 8.1 8.1 6.3  HGB 8.4* 9.3* 7.8*  HCT 25.2* 28.2* 23.1*  PLT 198 258 213    Recent Labs Lab 08/08/15 1448  08/11/15 0529 08/12/15 0432 08/13/15 0329  NA 140  < > 142 143 138  K 3.6  < > 3.9 4.3 2.9*  CL 107  < > 111 111 105  CO2 25  < > 25 21* 25  BUN 43*  < > 12 7 5*  CREATININE 1.11  < > 0.85 0.78 0.79  CALCIUM 8.5*  < > 8.4* 7.0* 7.9*  PROT 6.6  --   --   --   --   BILITOT 0.6  --   --   --   --   ALKPHOS 144*  --   --   --   --   ALT 33  --   --   --   --   AST 31  --   --   --   --   GLUCOSE 126*  < > 125* 100* 101*  < > = values in this interval not displayed. INR/Prothrombin Time 1.33/17 Lactic Acid: 1.12  Imaging/Diagnostic Tests: CT Head 9/30 IMPRESSION: Prior RIGHT MCA territory infarct. Small vessel chronic ischemic changes of deep cerebral white matter. No acute intracranial abnormalities. Sphenoid sinus opacification. EGD: erosive gastritis  Mercy Riding, MD 08/13/2015, 7:18 AM PGY-1, [place] Intern pager: [phone], text pages welcome

## 2015-08-13 NOTE — Progress Notes (Signed)
Physical Therapy Treatment Patient Details Name: [name] [name] MRN: [id number] DOB: June 01, 1961 Today's Date: 08/13/2015    History of Present Illness Pt is a 54 y.o. male presented to ED 9/30 with GI bleed. Recently admitted for CVA with residual L hemiplegia.    PT Comments    Progressing as expected, but limited by lethargy.  Pt having problems initiating mobility.  Needing cues to keep pt on task.  Emphasis on standing, pre gait and gait.  Pt needing min assist with 2 persons for safety.   Follow Up Recommendations  SNF     Equipment Recommendations  Other (comment)    Recommendations for Other Services       Precautions / Restrictions Precautions Precautions: Fall Precaution Comments: left hemiplegia UE>LE Restrictions Weight Bearing Restrictions: No    Mobility  Bed Mobility Overal bed mobility: Needs Assistance Bed Mobility: Sidelying to Sit;Sit to Sidelying   Sidelying to sit: Mod assist     Sit to sidelying: Mod assist General bed mobility comments: helped initiate and assist with roll and truncal assist up from L side  Transfers Overall transfer level: Needs assistance   Transfers: Sit to/from Stand Sit to Stand: Min assist;+2 safety/equipment         General transfer comment: assist to come forward  Ambulation/Gait Ambulation/Gait assistance: Min assist;+2 physical assistance Ambulation Distance (Feet): 24 Feet Assistive device: Rolling walker (2 wheeled) Gait Pattern/deviations: Step-through pattern;Decreased step length - right;Decreased step length - left;Decreased stance time - left;Decreased stride length (hemiparetic) Gait velocity: slower   General Gait Details: variable hemiparetic gait with left LE more clumbsy as it fatigued.   Stairs            Wheelchair Mobility    Modified Rankin (Stroke Patients Only)       Balance Overall balance assessment: Needs assistance Sitting-balance support: Single extremity supported;No  upper extremity supported Sitting balance-Leahy Scale: Fair     Standing balance support: Bilateral upper extremity supported;Single extremity supported Standing balance-Leahy Scale: Poor Standing balance comment: reliance on assist.  Used RW, worked on pre gait activity prior to gait.                    Cognition Arousal/Alertness: Awake/alert Behavior During Therapy: Flat affect Overall Cognitive Status: Impaired/Different from baseline Area of Impairment: Orientation;Attention;Following commands;Safety/judgement;Awareness;Problem solving Orientation Level: Disoriented to;Time;Situation Current Attention Level: Sustained Memory: Decreased short-term memory Following Commands: Follows one step commands consistently Safety/Judgement: Decreased awareness of deficits;Decreased awareness of safety Awareness: Intellectual Problem Solving: Slow processing;Decreased initiation General Comments: Pt had difficulty initiating any of the mobility tasks.    Exercises      General Comments        Pertinent Vitals/Pain Pain Assessment: Faces Faces Pain Scale: Hurts little more Pain Location: left hand Pain Descriptors / Indicators: Grimacing Pain Intervention(s): Monitored during session    Home Living                      Prior Function            PT Goals (current goals can now be found in the care plan section) Acute Rehab PT Goals Patient Stated Goal: Pt did not state  PT Goal Formulation: With patient Time For Goal Achievement: 08/25/15 Potential to Achieve Goals: Good Progress towards PT goals: Progressing toward goals    Frequency  Min 3X/week    PT Plan Current plan remains appropriate    Co-evaluation  End of Session   Activity Tolerance: Patient limited by fatigue Patient left: in bed;with call bell/phone within reach     Time: 1447-1514 PT Time Calculation (min) (ACUTE ONLY): 27 min  Charges:  $Gait Training: 8-22  mins $Therapeutic Activity: 8-22 mins                    G Codes:      Gillie Crisci, Tessie Fass 08/13/2015, 4:01 PM 08/13/2015  Donnella Sham, PT [phone]  (pager)

## 2015-08-13 NOTE — Progress Notes (Signed)
Report called to RN receiving pt. 

## 2015-08-13 NOTE — Discharge Instructions (Addendum)
It has been a pleasure taking care of you! You were admitted due to low hemoglobin which is part of your blood cell that carries oxygen to your body. This is secondary to blood loss from your bowel. We gave you some blood to bring your hemoglobin level up. Our gastroenterologist also looked at your bowel through flexible camera that showed some mild erosion of the inside of your stomach, called erosive gastritis. We have sent a test to see if this is caused by an infection, and the test came back positive. We have started you on medicaton to treat the infection as well as your other condtions. We will be discharging you on more of this and other additional medications that you need to continue taking after you leave the hospital. The names and directions on how to take these medications are found on this discharge paper under medication section.  You also need a follow up with your PCP. The address, date and time are found on the discharge paper under follow up section.  Below, you can find some reading about gastritis.  Take care,  Gastritis, Adult Gastritis is soreness and puffiness (inflammation) of the lining of the stomach. If you do not get help, gastritis can cause bleeding and sores (ulcers) in the stomach. HOME CARE   Only take medicine as told by your doctor.  If you were given antibiotic medicines, take them as told. Finish the medicines even if you start to feel better.  Drink enough fluids to keep your pee (urine) clear or pale yellow.  Avoid foods and drinks that make your problems worse. Foods you may want to avoid include:  Caffeine or alcohol.  Chocolate.  Mint.  Garlic and onions.  Spicy foods.  Citrus fruits, including oranges, lemons, or limes.  Food containing tomatoes, including sauce, chili, salsa, and pizza.  Fried and fatty foods.  Eat small meals throughout the day instead of large meals. GET HELP RIGHT AWAY IF:   You have black or dark red poop  (stools).  You throw up (vomit) blood. It may look like coffee grounds.  You cannot keep fluids down.  Your belly (abdominal) pain gets worse.  You have a fever.  You do not feel better after 1 week.  You have any other questions or concerns. MAKE SURE YOU:   Understand these instructions.  Will watch your condition.  Will get help right away if you are not doing well or get worse.   This information is not intended to replace advice given to you by your health care provider. Make sure you discuss any questions you have with your health care provider.   Document Released: 04/12/2008 Document Revised: 01/17/2012 Document Reviewed: 12/08/2011 Elsevier Interactive Patient Education [employer].

## 2015-08-13 NOTE — Progress Notes (Signed)
08/13/2015 12:04 PM  Report received. Room is ready for patient.   [employer], RN-BC, [facility] Phone [phone]

## 2015-08-13 NOTE — Progress Notes (Signed)
Speech Language Pathology Treatment: Dysphagia  Patient Details Name: [name] [name] MRN: [id number] DOB: 11/30/1960 Today's Date: 08/13/2015 Time: 7672-0947 SLP Time Calculation (min) (ACUTE ONLY): 20 min  Assessment / Plan / Recommendation Clinical Impression  During swallow treatment, pt fed self with set up assist. SLP provided moderate verbal, visual, and tactile cues for pt to use his left hand as an anchor and scan from left to right, take one bite at a time, follow bites with sips, and swallow twice. Pt repeated 1 strategy back to SLP. At the end of the meal, pt had one large straw sip of nectar thick water and coughed immediately. Pt demonstrated impulsivity, taking large sips and multiple bites at a time before clearing pocketed food. Recommend current diet, no straws, and full supervision to cue pt's use of strategies. SLP will f/u with check for diet tolerance, use of strategies, and upgraded po trials of thin liquid.   HPI Other Pertinent Information: Pt is a 54 y.o. male presented to ED 9/30 with GI bleed. Recently admitted for CVA with residual L hemiplegia. Pt had feeding tube placed following stroke which was removed August 2016. Pt had MBS on 07/24/15 with recommendations for dysphagia 2 diet/ thin liquids with cues for small sips and intermittent 2nd swallow and full supervision; no penetration/ aspiration was observed on the MBS. CXR showed persistent RLL airspace opacity suspicious for PNA. Bedside swallow eval ordered to re-assess swallow function given apparent hx of recurrent aspiration PNAs.    Pertinent Vitals Pain Assessment: Faces Faces Pain Scale: No hurt  SLP Plan  Continue with current plan of care    Recommendations Diet recommendations: Dysphagia 2 (fine chop);Nectar-thick liquid Liquids provided via: No straw;Cup Medication Administration: Crushed with puree Supervision: Patient able to self feed;Full supervision/cueing for compensatory strategies Compensations:  Slow rate;Small sips/bites;Check for pocketing;Clear throat intermittently;Multiple dry swallows after each bite/sip;Minimize environmental distractions Postural Changes and/or Swallow Maneuvers: Seated upright 90 degrees              Oral Care Recommendations: Oral care QID Follow up Recommendations: Skilled Nursing facility Plan: Continue with current plan of care    [place], Student-SLP  [place]anier Ensign 08/13/2015, 9:17 AM

## 2015-08-13 NOTE — Progress Notes (Signed)
08/13/2015 5:59 PM  Transfer Note:   Traveling Method: Via Bed with RN and NT  Transferring Unit: [facility]  Mental Orientation: Alert and oriented Telemetry: No telemetry per MD orders Assessment: Completed Skin: Warm, dry and intact per visual assessment. Patient refused skin assessment. Orson Gear second RN to verify patient refusal. IV: Clean, dry and intact. IV fluids running per MD orders Pain: None stated at this time Tubes: External foley prior to transfer  Safety Measures: Safety Fall Prevention Plan has been discussed Admission: Completed [facility] Orientation: Patient has been orientated to the room, unit and staff.  Family: None at bedside at this time  Transferring Incident:: Per MD notes and report from [facility]. Patient stable to come to Med-Surg bed. No issues during transfer, patient tolerated it well.  Orders have been reviewed and implemented. Will continue to monitor the patient. Call light has been placed within reach and bed alarm has been activated.   [employer], RN-BC, RN3 Phone number: [phone]

## 2015-08-13 NOTE — Progress Notes (Signed)
Notified daughter of pts transfer to [id number].

## 2015-08-14 DIAGNOSIS — F329 Major depressive disorder, single episode, unspecified: Secondary | ICD-10-CM | POA: Diagnosis present

## 2015-08-14 DIAGNOSIS — E876 Hypokalemia: Secondary | ICD-10-CM | POA: Diagnosis not present

## 2015-08-14 DIAGNOSIS — K2971 Gastritis, unspecified, with bleeding: Secondary | ICD-10-CM | POA: Diagnosis not present

## 2015-08-14 DIAGNOSIS — Z79899 Other long term (current) drug therapy: Secondary | ICD-10-CM | POA: Diagnosis not present

## 2015-08-14 DIAGNOSIS — I951 Orthostatic hypotension: Secondary | ICD-10-CM | POA: Diagnosis present

## 2015-08-14 DIAGNOSIS — K921 Melena: Secondary | ICD-10-CM | POA: Diagnosis not present

## 2015-08-14 DIAGNOSIS — F339 Major depressive disorder, recurrent, unspecified: Secondary | ICD-10-CM | POA: Diagnosis not present

## 2015-08-14 DIAGNOSIS — I63511 Cerebral infarction due to unspecified occlusion or stenosis of right middle cerebral artery: Secondary | ICD-10-CM | POA: Diagnosis not present

## 2015-08-14 DIAGNOSIS — F0151 Vascular dementia with behavioral disturbance: Secondary | ICD-10-CM | POA: Diagnosis present

## 2015-08-14 DIAGNOSIS — W19XXXD Unspecified fall, subsequent encounter: Secondary | ICD-10-CM | POA: Diagnosis not present

## 2015-08-14 DIAGNOSIS — E782 Mixed hyperlipidemia: Secondary | ICD-10-CM | POA: Diagnosis not present

## 2015-08-14 DIAGNOSIS — F0631 Mood disorder due to known physiological condition with depressive features: Secondary | ICD-10-CM | POA: Diagnosis not present

## 2015-08-14 DIAGNOSIS — I69391 Dysphagia following cerebral infarction: Secondary | ICD-10-CM | POA: Diagnosis not present

## 2015-08-14 DIAGNOSIS — E119 Type 2 diabetes mellitus without complications: Secondary | ICD-10-CM | POA: Diagnosis not present

## 2015-08-14 DIAGNOSIS — K279 Peptic ulcer, site unspecified, unspecified as acute or chronic, without hemorrhage or perforation: Secondary | ICD-10-CM | POA: Diagnosis not present

## 2015-08-14 DIAGNOSIS — K922 Gastrointestinal hemorrhage, unspecified: Secondary | ICD-10-CM | POA: Diagnosis not present

## 2015-08-14 DIAGNOSIS — I69991 Dysphagia following unspecified cerebrovascular disease: Secondary | ICD-10-CM | POA: Diagnosis not present

## 2015-08-14 DIAGNOSIS — Z87891 Personal history of nicotine dependence: Secondary | ICD-10-CM | POA: Diagnosis not present

## 2015-08-14 DIAGNOSIS — J168 Pneumonia due to other specified infectious organisms: Secondary | ICD-10-CM | POA: Diagnosis not present

## 2015-08-14 DIAGNOSIS — R1310 Dysphagia, unspecified: Secondary | ICD-10-CM | POA: Diagnosis not present

## 2015-08-14 DIAGNOSIS — R410 Disorientation, unspecified: Secondary | ICD-10-CM | POA: Diagnosis not present

## 2015-08-14 DIAGNOSIS — D62 Acute posthemorrhagic anemia: Secondary | ICD-10-CM | POA: Diagnosis not present

## 2015-08-14 DIAGNOSIS — E870 Hyperosmolality and hypernatremia: Secondary | ICD-10-CM | POA: Diagnosis present

## 2015-08-14 DIAGNOSIS — Z7902 Long term (current) use of antithrombotics/antiplatelets: Secondary | ICD-10-CM | POA: Diagnosis not present

## 2015-08-14 DIAGNOSIS — E11649 Type 2 diabetes mellitus with hypoglycemia without coma: Secondary | ICD-10-CM | POA: Diagnosis present

## 2015-08-14 DIAGNOSIS — M6281 Muscle weakness (generalized): Secondary | ICD-10-CM | POA: Diagnosis not present

## 2015-08-14 DIAGNOSIS — R42 Dizziness and giddiness: Secondary | ICD-10-CM | POA: Diagnosis not present

## 2015-08-14 DIAGNOSIS — E1149 Type 2 diabetes mellitus with other diabetic neurological complication: Secondary | ICD-10-CM | POA: Diagnosis not present

## 2015-08-14 DIAGNOSIS — I4581 Long QT syndrome: Secondary | ICD-10-CM | POA: Diagnosis not present

## 2015-08-14 DIAGNOSIS — E860 Dehydration: Secondary | ICD-10-CM | POA: Diagnosis not present

## 2015-08-14 DIAGNOSIS — I63411 Cerebral infarction due to embolism of right middle cerebral artery: Secondary | ICD-10-CM | POA: Diagnosis not present

## 2015-08-14 DIAGNOSIS — I6930 Unspecified sequelae of cerebral infarction: Secondary | ICD-10-CM | POA: Diagnosis not present

## 2015-08-14 DIAGNOSIS — I69154 Hemiplegia and hemiparesis following nontraumatic intracerebral hemorrhage affecting left non-dominant side: Secondary | ICD-10-CM | POA: Diagnosis not present

## 2015-08-14 DIAGNOSIS — I10 Essential (primary) hypertension: Secondary | ICD-10-CM | POA: Diagnosis not present

## 2015-08-14 DIAGNOSIS — B9681 Helicobacter pylori [H. pylori] as the cause of diseases classified elsewhere: Secondary | ICD-10-CM | POA: Diagnosis not present

## 2015-08-14 DIAGNOSIS — R531 Weakness: Secondary | ICD-10-CM | POA: Diagnosis not present

## 2015-08-14 DIAGNOSIS — R079 Chest pain, unspecified: Secondary | ICD-10-CM | POA: Diagnosis not present

## 2015-08-14 DIAGNOSIS — F17290 Nicotine dependence, other tobacco product, uncomplicated: Secondary | ICD-10-CM | POA: Diagnosis not present

## 2015-08-14 DIAGNOSIS — R4189 Other symptoms and signs involving cognitive functions and awareness: Secondary | ICD-10-CM | POA: Diagnosis not present

## 2015-08-14 DIAGNOSIS — N179 Acute kidney failure, unspecified: Secondary | ICD-10-CM | POA: Diagnosis not present

## 2015-08-14 DIAGNOSIS — E785 Hyperlipidemia, unspecified: Secondary | ICD-10-CM | POA: Diagnosis not present

## 2015-08-14 DIAGNOSIS — R4182 Altered mental status, unspecified: Secondary | ICD-10-CM | POA: Diagnosis not present

## 2015-08-14 DIAGNOSIS — I67848 Other cerebrovascular vasospasm and vasoconstriction: Secondary | ICD-10-CM | POA: Diagnosis not present

## 2015-08-14 DIAGNOSIS — I639 Cerebral infarction, unspecified: Secondary | ICD-10-CM | POA: Diagnosis not present

## 2015-08-14 DIAGNOSIS — Z7982 Long term (current) use of aspirin: Secondary | ICD-10-CM | POA: Diagnosis not present

## 2015-08-14 DIAGNOSIS — A630 Anogenital (venereal) warts: Secondary | ICD-10-CM | POA: Diagnosis not present

## 2015-08-14 DIAGNOSIS — R05 Cough: Secondary | ICD-10-CM | POA: Diagnosis not present

## 2015-08-14 DIAGNOSIS — Z955 Presence of coronary angioplasty implant and graft: Secondary | ICD-10-CM | POA: Diagnosis not present

## 2015-08-14 DIAGNOSIS — E1169 Type 2 diabetes mellitus with other specified complication: Secondary | ICD-10-CM | POA: Diagnosis not present

## 2015-08-14 DIAGNOSIS — G9341 Metabolic encephalopathy: Secondary | ICD-10-CM | POA: Diagnosis not present

## 2015-08-14 DIAGNOSIS — I69354 Hemiplegia and hemiparesis following cerebral infarction affecting left non-dominant side: Secondary | ICD-10-CM | POA: Diagnosis not present

## 2015-08-14 DIAGNOSIS — R0689 Other abnormalities of breathing: Secondary | ICD-10-CM | POA: Diagnosis not present

## 2015-08-14 DIAGNOSIS — R4702 Dysphasia: Secondary | ICD-10-CM | POA: Diagnosis not present

## 2015-08-14 LAB — BASIC METABOLIC PANEL
ANION GAP: 5 (ref 5–15)
BUN: 5 mg/dL — ABNORMAL LOW (ref 6–20)
CO2: 27 mmol/L (ref 22–32)
Calcium: 8.3 mg/dL — ABNORMAL LOW (ref 8.9–10.3)
Chloride: 105 mmol/L (ref 101–111)
Creatinine, Ser: 0.82 mg/dL (ref 0.61–1.24)
GFR calc Af Amer: >60 mL/min (ref >60)
GFR calc non Af Amer: >60 mL/min (ref >60)
GLUCOSE: 88 mg/dL (ref 65–99)
POTASSIUM: 3.3 mmol/L — AB (ref 3.5–5.1)
Sodium: 137 mmol/L (ref 135–145)

## 2015-08-14 LAB — CBC
HEMATOCRIT: 26.1 % — AB (ref 39.0–52.0)
HEMOGLOBIN: 8.5 g/dL — AB (ref 13.0–17.0)
MCH: 28 pg (ref 26.0–34.0)
MCHC: 32.6 g/dL (ref 30.0–36.0)
MCV: 85.9 fL (ref 78.0–100.0)
Platelets: 235 10*3/uL (ref 150–400)
RBC: 3.04 MIL/uL — ABNORMAL LOW (ref 4.22–5.81)
RDW: 15.2 % (ref 11.5–15.5)
WBC: 6.4 10*3/uL (ref 4.0–10.5)

## 2015-08-14 LAB — GLUCOSE, CAPILLARY: Glucose-Capillary: 72 mg/dL (ref 65–99)

## 2015-08-14 MED ORDER — RESOURCE THICKENUP CLEAR PO POWD: 30.0000 | ORAL | Status: DC | PRN

## 2015-08-14 MED ORDER — DOCUSATE SODIUM 100 MG PO CAPS
100.0000 mg | ORAL_CAPSULE | Freq: Two times a day (BID) | ORAL | Status: DC
  Administered 2015-08-14: 100 mg via ORAL
  Filled 2015-08-14: qty 1

## 2015-08-14 MED ORDER — ENSURE ENLIVE PO LIQD: 237.0000 mL | Freq: Two times a day (BID) | ORAL | Status: DC

## 2015-08-14 MED ORDER — ASPIRIN 325 MG PO TABS: 325.0000 mg | ORAL_TABLET | Freq: Every day | ORAL | Status: DC

## 2015-08-14 MED ORDER — AMLODIPINE BESYLATE 5 MG PO TABS
5.0000 mg | ORAL_TABLET | Freq: Every day | ORAL | Status: DC
  Administered 2015-08-14: 5 mg via ORAL
  Filled 2015-08-14: qty 1

## 2015-08-14 MED ORDER — CLARITHROMYCIN 500 MG PO TABS
500.0000 mg | ORAL_TABLET | Freq: Two times a day (BID) | ORAL | Status: DC
  Administered 2015-08-14: 500 mg via ORAL
  Filled 2015-08-14 (×2): qty 1

## 2015-08-14 MED ORDER — AMOXICILLIN 500 MG PO CAPS
1000.0000 mg | ORAL_CAPSULE | Freq: Two times a day (BID) | ORAL | Status: DC
Start: 2015-08-14 — End: 2015-09-07

## 2015-08-14 MED ORDER — AMOXICILLIN 500 MG PO CAPS
1000.0000 mg | ORAL_CAPSULE | Freq: Two times a day (BID) | ORAL | Status: DC
  Administered 2015-08-14: 1000 mg via ORAL
  Filled 2015-08-14 (×2): qty 2

## 2015-08-14 MED ORDER — ATORVASTATIN CALCIUM 80 MG PO TABS: 80.0000 mg | ORAL_TABLET | Freq: Every day | ORAL | Status: DC

## 2015-08-14 MED ORDER — DOCUSATE SODIUM 50 MG/5ML PO LIQD: 100.0000 mg | Freq: Two times a day (BID) | ORAL | Status: DC

## 2015-08-14 MED ORDER — CLARITHROMYCIN 500 MG PO TABS: 500.0000 mg | ORAL_TABLET | Freq: Two times a day (BID) | ORAL | Status: DC

## 2015-08-14 MED ORDER — PANTOPRAZOLE SODIUM 40 MG PO TBEC: 40.0000 mg | DELAYED_RELEASE_TABLET | Freq: Two times a day (BID) | ORAL | Status: DC

## 2015-08-14 NOTE — Progress Notes (Signed)
Nutrition Follow-up  DOCUMENTATION CODES:   Not applicable  INTERVENTION:   Provide Ensure Enlive po BID (thickened to nectar thick liquids), each supplement provides 350 kcal and 20 grams of protein.  Encourage adequate PO intake.   NUTRITION DIAGNOSIS:   Inadequate oral intake related to altered GI function as evidenced by NPO status; advanced to diet; po 25-100%; ongoing  GOAL:   Patient will meet greater than or equal to 90% of their needs; progressing  MONITOR:   PO intake, Diet advancement, Labs, Weight trends, Skin, I & O's  REASON FOR ASSESSMENT:   Malnutrition Screening Tool    ASSESSMENT:   [name] [name] is a 54 y.o. male presenting with GI bleed. PMH is significant for CVA in 2016 with residual L hemiplegia (on ASA and Plavix), HTN, HLD.  Meal completion has been varied from 25-100%. Pt reports having a good appetite however. Pt agreeable to Ensure to aid in caloric and protein needs. RD to order. Pt encouraged to eat his food at meals.   Labs and medications reviewed.   Diet Order:  DIET DYS 2 Room service appropriate?: Yes; Fluid consistency:: Nectar Thick  Skin:  Reviewed, no issues  Last BM:  10/4  Height:   Ht Readings from Last 1 Encounters:  08/09/15 6\' 2"  (1.88 m)    Weight:   Wt Readings from Last 1 Encounters:  08/09/15 229 lb 11.5 oz (104.2 kg)    Ideal Body Weight:  86.3 kg  BMI:  Body mass index is 29.48 kg/(m^2).  Estimated Nutritional Needs:   Kcal:  2000-2200  Protein:  100-110 grams  Fluid:  2.0-2.2 L  EDUCATION NEEDS:   No education needs identified at this time  Corrin Parker, MS, RD, LDN Pager # [phone] After hours/ weekend pager # [phone]

## 2015-08-14 NOTE — Progress Notes (Signed)
Speech Language Pathology Treatment: Dysphagia  Patient Details Name: [name] [name] MRN: [id number] DOB: Apr 04, 1961 Today's Date: 08/14/2015 Time: 7412-8786 SLP Time Calculation (min) (ACUTE ONLY): 13 min  Assessment / Plan / Recommendation Clinical Impression  Pt required total assist for performance of double swallow and alternate solids/fluids. He exhibits confusion; responds with head nods before hearing what speaker is saying. Unable to repeat any information heard from resident when questioned by SLP. Rate of self feeding adequate; needed verbal cueing to remove pocketed eggs and complete double swallows. One episode of likely laryngeal penetration with delayed and sudden cough. Recommend continue Dys 2, nectar with full assist. Not appropriate for thin liquid trials today. Continue ST in hospital and SNF.   HPI Other Pertinent Information: Pt is a 54 y.o. male presented to ED 9/30 with GI bleed. Recently admitted for CVA with residual L hemiplegia. Pt had feeding tube placed following stroke which was removed August 2016. Pt had MBS on 07/24/15 with recommendations for dysphagia 2 diet/ thin liquids with cues for small sips and intermittent 2nd swallow and full supervision; no penetration/ aspiration was observed on the MBS. CXR showed persistent RLL airspace opacity suspicious for PNA. Bedside swallow eval ordered to re-assess swallow function given apparent hx of recurrent aspiration PNAs.    Pertinent Vitals Pain Assessment: No/denies pain  SLP Plan  Continue with current plan of care    Recommendations Diet recommendations: Dysphagia 2 (fine chop);Nectar-thick liquid Liquids provided via: No straw;Cup Medication Administration: Crushed with puree Supervision: Patient able to self feed;Full supervision/cueing for compensatory strategies Compensations: Slow rate;Small sips/bites;Check for pocketing;Clear throat intermittently;Multiple dry swallows after each bite/sip;Minimize  environmental distractions Postural Changes and/or Swallow Maneuvers: Seated upright 90 degrees              Oral Care Recommendations: Oral care QID Follow up Recommendations: Skilled Nursing facility Plan: Continue with current plan of care    GO     Houston Siren 08/14/2015, 9:10 AM  Orbie Pyo Colvin Caroli.Ed [employer] [phone]

## 2015-08-14 NOTE — Progress Notes (Signed)
This is a late entry.  Patient noted to have a small black box on his left chest/over his heart. The box reads "Body Guardian Heart." No documentation of this noted anywhere in chart. Unit CN to page MD and pursue this further.  Joellen Jersey, RN.

## 2015-08-14 NOTE — Care Management Note (Signed)
Case Management Note  Patient Details  Name: [name] [name] MRN: [id number] Date of Birth: [birth year]  Subjective/Objective:        CM following for progression and d/c planning.            Action/Plan: 08/14/2015 Met with pt who is currently a resident of SNF, [facility]. CSW , V Sharlet [place] also following with pt re d/c plan and return to SNF.   Expected Discharge Date:                  Expected Discharge Plan:  [facility] ([facility])  In-House Referral:  Clinical Social Work  Discharge planning Services  CM Consult  Post Acute Care Choice:    Choice offered to:     DME Arranged:    DME Agency:     HH Arranged:    [place] Agency:     Status of Service:  In process, will continue to follow  Medicare Important Message Given:  Yes-third notification given Date Medicare IM Given:    Medicare IM give by:    Date Additional Medicare IM Given:    Additional Medicare Important Message give by:     If discussed at [place] of Stay Meetings, dates discussed:    Additional Comments:  Adron Bene, RN 08/14/2015, 12:38 PM

## 2015-08-14 NOTE — Care Management Important Message (Signed)
Important Message  Patient Details  Name: [name] [name] MRN: [id number] Date of Birth: 29-Mar-1961   Medicare Important Message Given:  Yes-third notification given    RoyalRory Percy, RN 08/14/2015, 11:13 AM

## 2015-08-14 NOTE — Progress Notes (Signed)
08/14/2015 5:58 PM  Spoke with resident on call this am regarding Guardian heart monitor present externally to left chest of patient.  Noticed upon assessment this am, as he refused assessment by receiving nurse yesterday.  Resident on call notified this am, no orders given at this time, stated that the team would attempt to follow up on it.  Received discharge orders this afternoon for patient.  Called resident on call back to remind them of the heart monitor and asked if any specific instructions needed to be relayed to the patient or to the receiving facility.  Resident informed this RN that the heart monitor should be left in place, and that the PCP that placed it on the patient should follow up with the patient on an outpatient basis.    [name] [name]

## 2015-08-14 NOTE — Progress Notes (Signed)
Report called to [facility] in [place].   Joellen Jersey, RN.

## 2015-08-14 NOTE — Clinical Social Work Note (Signed)
CSW intern spoke with Mr. Zick daughter via telephone regarding patient returning to [facility] when ready for discharge. Also, CSW discussed Medicaid and Medicare part D coverage for patient with daughter [name]).  Patient's daughter confirmed that patient would return to [facility] for therapy, and when able would go live with her. After speaking with patients daughter [name], CSW and CSW intern spoke with Mr. Danh regarding his return to [facility], and the patient declined returning. Patient requested to go home with daughter after being discharged. After speaking with patient, CSW contacted daughter to inform her that patient declined going back to SNF. [name] stated that she would contact her father regarding his discharging plans.    [place] Work Intern Preceptor: Crawford Givens [phone]

## 2015-08-14 NOTE — Clinical Social Work Note (Signed)
CSW received call from MD regarding patient discharging back to [facility] today and informed CSW that he had talked with patient. Patient visited again and was agreeable to returning to facility.  Discharge information forwarded to SNF and patient will be transported by ambulance. Ms. Jerelene Redden, daughter contacted and informed that transport called and patient returning to SNF this evening.   Kayl Stogdill Givens, MSW, LCSW Licensed Clinical Social Worker [place] [phone]

## 2015-08-14 NOTE — Progress Notes (Signed)
Family Medicine Teaching Service Daily Progress Note Intern Pager: [phone]  Patient name: [name] [name] Medical record number: [id number] Date of birth: 01/19/1961 Age: 54 y.o. Gender: male  Primary Care Provider: Gildardo Cranker, DO Consultants: GI Code Status: Full  Pt Overview and Major Events to Date:  9/30: Admitted for symptomatic anemia 2/2 GI bleed 10/1: Transfused 3rd unit of PRBCs 10/02: transfused 4th and 5th units of blood 10/04: EGD with erosive gastritis 10/05: transferred to floor  Assessment and Plan: [name] [name] is a 54 y.o. male presenting with AMS who was found to have a significant GI bleed. PMH is significant for CVA in 2016 with residual L hemiplegia (on ASA and Plavix), HTN, HLD.  GI Bleed/Acute Blood Loss Anemia: Pt with melenotic / heme + stools. On admission Hgb was 5.0, has required 5 units of pRBCs to date. Likely upper GI bleed given melena and elevated BUN 42>>23, but cannot r/o diverticular bleed (h/o constipation). No BRB. Patient had been on Plavix and ASA after RMCA stent placement. Vital signs stable at this time.  - transferred to floor 10/05 - Two PIV lines in place - Hgb 5.0>4units> 7.9 >> 1u>>8.5>>7.8>8.5>8.5.   - H&H stable - PT/INR 16.6/1.33 on 10/02  - EGD on 10/04 positive for erosive gastritis. - GI recs:  - PPI bid for 4 weeks then PPI qam long term  - resume Plavix and ASA 10/04  -Treat if H.pylori serology positive. -H-pylori IgG positive. IgA and IgM negative. Will start triple therapy although serology is not helpful in delineating between active infection and previous exposure. Doesn't appear to have been on treatment for this in the past.   AMS: Likely 2/2 acute blood loss anemia from GI bleed vs. Uremia; patient has neuro deficits at baseline 2/2 recent h/o stroke, but was noted to have increased lethargy and confusion by OP caretakers. CT head in ED with no acute process. Pt sleepy but arousable. Oriented to self, person, month  and place. - Neuro checks by nursing - Strict bed rest  Hypokalemia: K down to 2.9> 78mEq x2 >3.3> 31mEq> -KCl PO 40 mEq. Will get third of three today -Bmet in PM if here.   HTN: BP up to 155/92 this AM. Has received all his medication. -Will continue to monitor and add additional BP med as needed. BB not an option with low HR. May consider adding Amlodipine. - Continue home lisinopril 40mg  daily, hydralazine 25 mg tid and HCTZ 25 mg daily  Hx of CVA: Occurred in June 2016. Had a stent placed in the R MCA. Pt with residual L hemiplegia. Has been on ASA and Plavix. Deficits appreciated on PE, these are believed to be at baseline since the CVA. - Resumed Plavix and ASA - Continue Atorvastatin and fenofibrate - PTx3/wk, SNF - OT SNF with 24 hrs supervision - SLP barrium swallow: Dysphagia-2  -thickner  -crushed pills with puree  -Speech therapy x2/wk  HLD: - Continue home Lipitor qd & Fenofibrate qd  Depression: Pt experienced depression after stroke - Continue home med: Prozac 20mg  qd  HCAP: being treated for HCAP at [facility]. CXR showing persistent asymmetric R lower lung airspace opacity, suspicious for pneumonia. Was on Levaquin x 10 days, discharged on 10 day course of Moxifloxacin. Finished course on 10/02.  FEN/GI: didn't have BM since 10/02. He started pureed diet on 10/04. - will start docusate 100 mg bid. Can add Miralax if no response. - KVO - Pureed diet - Replete as needed  Prophylaxis: SCDs in the setting of acute GI bleed. Also on Plavix and ASA  Disposition: floor status. D/cd fluid, foley & strict bed rest orders. Awaiting SNF placement. F/u with SW and CM  Subjective:  Patient has stable night. Denies abdominal pain, chest pain or shortness of breath. Denies pain overnight. Denies SOB or chest pain. He reports sleeping well. Report having BM yesterday in the morning. No sure about the look of BM. Has urinary cath in place.   Objective: Temp:  [98 F (36.7  C)-99 F (37.2 C)] 98.1 F (36.7 C) (10/06 0520) Pulse Rate:  [61-80] 61 (10/06 0520) Resp:  [15-22] 21 (10/06 0520) BP: (122-150)/(69-97) 135/97 mmHg (10/06 0520) SpO2:  [95 %-99 %] 97 % (10/06 0520)  Physical Exam: Gen: awake, NAD Eyes: PERRLA, sclera anicteric Oropharynx: clear, moist CV: RRR. S1 & S2 audible, 2+ radial and DP pulses bilaterally. Resp: no apparent WOB, CTAB. Abd: +BS. Soft, slightly tender to palpation, no rebound or guarding.  Ext: No edema or gross deformities. Neuro: Alert and oriented, left sided ext weakness 2/5 in LUE, 4/5 in LLE  Laboratory:  Recent Labs Lab 08/12/15 1555 08/13/15 0329 08/13/15 1405  WBC 8.1 6.3 7.3  HGB 9.3* 7.8* 8.5*  HCT 28.2* 23.1* 25.7*  PLT 258 213 224    Recent Labs Lab 08/08/15 1448  08/12/15 0432 08/13/15 0329 08/13/15 1718  NA 140  < > 143 138 138  K 3.6  < > 4.3 2.9* 3.2*  CL 107  < > 111 105 104  CO2 25  < > 21* 25 25  BUN 43*  < > 7 5* <5*  CREATININE 1.11  < > 0.78 0.79 0.79  CALCIUM 8.5*  < > 7.0* 7.9* 8.1*  PROT 6.6  --   --   --   --   BILITOT 0.6  --   --   --   --   ALKPHOS 144*  --   --   --   --   ALT 33  --   --   --   --   AST 31  --   --   --   --   GLUCOSE 126*  < > 100* 101* 86  < > = values in this interval not displayed. INR/Prothrombin Time 1.33/17 Lactic Acid: 1.12  Imaging/Diagnostic Tests: CT Head 9/30 IMPRESSION: Prior RIGHT MCA territory infarct. Small vessel chronic ischemic changes of deep cerebral white matter. No acute intracranial abnormalities. Sphenoid sinus opacification. EGD: erosive gastritis  Mercy Riding, MD 08/14/2015, 7:11 AM PGY-1, [place] Intern pager: [phone], text pages welcome

## 2015-08-15 ENCOUNTER — Non-Acute Institutional Stay (SKILLED_NURSING_FACILITY): Payer: Medicare Other | Admitting: Adult Health

## 2015-08-15 DIAGNOSIS — B9681 Helicobacter pylori [H. pylori] as the cause of diseases classified elsewhere: Secondary | ICD-10-CM

## 2015-08-15 DIAGNOSIS — I69354 Hemiplegia and hemiparesis following cerebral infarction affecting left non-dominant side: Secondary | ICD-10-CM | POA: Diagnosis not present

## 2015-08-15 DIAGNOSIS — I10 Essential (primary) hypertension: Secondary | ICD-10-CM | POA: Diagnosis not present

## 2015-08-15 DIAGNOSIS — K279 Peptic ulcer, site unspecified, unspecified as acute or chronic, without hemorrhage or perforation: Secondary | ICD-10-CM

## 2015-08-15 DIAGNOSIS — I69391 Dysphagia following cerebral infarction: Secondary | ICD-10-CM | POA: Diagnosis not present

## 2015-08-15 DIAGNOSIS — E1169 Type 2 diabetes mellitus with other specified complication: Secondary | ICD-10-CM

## 2015-08-15 DIAGNOSIS — I639 Cerebral infarction, unspecified: Secondary | ICD-10-CM | POA: Diagnosis not present

## 2015-08-15 DIAGNOSIS — E785 Hyperlipidemia, unspecified: Secondary | ICD-10-CM

## 2015-08-15 DIAGNOSIS — E1149 Type 2 diabetes mellitus with other diabetic neurological complication: Secondary | ICD-10-CM | POA: Diagnosis not present

## 2015-08-19 ENCOUNTER — Encounter: Payer: Self-pay | Admitting: Internal Medicine

## 2015-08-19 ENCOUNTER — Non-Acute Institutional Stay (SKILLED_NURSING_FACILITY): Payer: Medicare Other | Admitting: Internal Medicine

## 2015-08-19 DIAGNOSIS — I639 Cerebral infarction, unspecified: Secondary | ICD-10-CM | POA: Diagnosis not present

## 2015-08-19 DIAGNOSIS — I69391 Dysphagia following cerebral infarction: Secondary | ICD-10-CM | POA: Diagnosis not present

## 2015-08-19 DIAGNOSIS — D62 Acute posthemorrhagic anemia: Secondary | ICD-10-CM

## 2015-08-19 DIAGNOSIS — I6930 Unspecified sequelae of cerebral infarction: Secondary | ICD-10-CM

## 2015-08-19 DIAGNOSIS — IMO0002 Reserved for concepts with insufficient information to code with codable children: Secondary | ICD-10-CM

## 2015-08-19 DIAGNOSIS — F0631 Mood disorder due to known physiological condition with depressive features: Secondary | ICD-10-CM | POA: Diagnosis not present

## 2015-08-19 DIAGNOSIS — I69354 Hemiplegia and hemiparesis following cerebral infarction affecting left non-dominant side: Secondary | ICD-10-CM

## 2015-08-19 DIAGNOSIS — K921 Melena: Secondary | ICD-10-CM

## 2015-08-19 DIAGNOSIS — I10 Essential (primary) hypertension: Secondary | ICD-10-CM | POA: Diagnosis not present

## 2015-08-19 DIAGNOSIS — E1149 Type 2 diabetes mellitus with other diabetic neurological complication: Secondary | ICD-10-CM | POA: Diagnosis not present

## 2015-08-19 DIAGNOSIS — E785 Hyperlipidemia, unspecified: Secondary | ICD-10-CM

## 2015-08-19 NOTE — Progress Notes (Signed)
Patient ID: [name] [name], male   DOB: Mar 26, 1961, 54 y.o.   MRN: [id number]    HISTORY AND PHYSICAL   DATE: 08/19/15  Location:  [facility]    Place of Service: SNF [phone])   Extended Emergency Contact Information Primary Emergency Contact: [place] Mobile Phone: [phone] Relation: Daughter Secondary Emergency Contact: Marcella,Dalon  [place] Phone: [phone] Relation: Other  Advanced Directive information  FULL CODE  Chief Complaint  Patient presents with  . Readmit To SNF    HPI:  54 yo male seen today as areadmission into SNF following hospital stay for GI bleed/melanotic stools. He required total 5 units PRBC transfusion and Hgb 5.9-->8.5. EGD 10/4th revealed erosive gastritis but no active bleed. ASA/Plavix held on admission but was resumed prior to d/c  He has no c/o today. No depression. No further bloody stools. No nursing issues. No falls. He is a poor historian due to expressive aphasia. Hx obtained from chart  HTN - BP stable on HCTZ, hydralazine, minoxidil and norvasc. He takes ASA daily. No HA or dizziness  hx CVA w left hemiparesis - no new sx's on plavix and ASA. He has pain in contracted extremities that is controlled on norco. He continues to have dysphagia and is followed by speech. He takes amantadine  Depression - mood stable on prozac  DM - He is diet controlled  Hyperlipidemia - stable on fenofibrate and lipitor  Gastritis/GIB - on protonix BID, amoxil and clarithromycin through 10/20th  Past Medical History  Diagnosis Date  . Stroke ([place])   . Hypertension   . Hyperlipidemia   . Diabetes mellitus without complication ([place])   . Fall at home   . Hemiparesis affecting left side as late effect of stroke ([place])   . Dysphagia as late effect of cerebrovascular disease   . Acute respiratory failure ([place])   . Cerebral edema [facility])     Past Surgical History  Procedure  Laterality Date  . Gastrostomy tube placement  05-19-15  . Mechanical thrombectomy angioplasty stenet placement    . Esophagogastroduodenoscopy N/A 08/12/2015    Procedure: ESOPHAGOGASTRODUODENOSCOPY (EGD);  Surgeon: Ladene Artist, MD;  Location: [facility] ENDOSCOPY;  Service: Endoscopy;  Laterality: N/A;    Patient Care Team: Gildardo Cranker, DO as PCP - General (Internal Medicine)  Social History   Social History  . Marital Status: Single    Spouse Name: N/A  . Number of Children: N/A  . Years of Education: N/A   Occupational History  . Not on file.   Social History Main Topics  . Smoking status: Former Smoker    Types: Cigarettes  . Smokeless tobacco: Not on file  . Alcohol Use: Not on file  . Drug Use: Not on file  . Sexual Activity: Not on file   Other Topics Concern  . Not on file   Social History Narrative     reports that he has quit smoking. His smoking use included Cigarettes. He does not have any smokeless tobacco history on file. His alcohol and drug histories are not on file.  Family History  Problem Relation Age of Onset  . Family history unknown: Yes   No family status information on file.    Immunization History  Administered Date(s) Administered  . Influenza,inj,Quad PF,36+ Mos 08/10/2015    No Known Allergies  Medications: Patient's Medications  New Prescriptions   No medications on file  Previous Medications   AMANTADINE (SYMMETREL)  100 MG CAPSULE    200 mg by Gastrostomy Tube route 2 (two) times daily.    AMLODIPINE (NORVASC) 10 MG TABLET    10 mg by Gastrostomy Tube route daily.    AMOXICILLIN (AMOXIL) 500 MG CAPSULE    Take 2 capsules (1,000 mg total) by mouth every 12 (twelve) hours.   ASPIRIN 325 MG TABLET    Take 1 tablet (325 mg total) by mouth daily.   ATORVASTATIN (LIPITOR) 80 MG TABLET    Take 1 tablet (80 mg total) by mouth daily at 6 PM.   CLARITHROMYCIN (BIAXIN) 500 MG TABLET    Take 1 tablet (500 mg total) by mouth every 12  (twelve) hours.   CLOPIDOGREL (PLAVIX) 75 MG TABLET    75 mg by Gastrostomy Tube route daily.    FEEDING SUPPLEMENT, ENSURE ENLIVE, (ENSURE ENLIVE) LIQD    Take 237 mLs by mouth 2 (two) times daily between meals.   FENOFIBRATE 40 MG TABS    1 tablet by Gastrostomy Tube route every evening.    FLUOXETINE (PROZAC) 20 MG CAPSULE    20 mg by Gastrostomy Tube route daily.    HYDRALAZINE (APRESOLINE) 10 MG TABLET    Take 10 mg by mouth every 8 (eight) hours.    HYDROCHLOROTHIAZIDE (HYDRODIURIL) 25 MG TABLET    25 mg by Gastrostomy Tube route daily.    HYDROCODONE-ACETAMINOPHEN (NORCO/VICODIN) 5-325 MG PER TABLET    Take one tablet by mouth every 6 hours as needed for pain. Not to exceed 3066m of APAP from all sources/24h   LISINOPRIL (PRINIVIL,ZESTRIL) 40 MG TABLET    Take 40 mg by mouth daily.   MALTODEXTRIN-XANTHAN GUM (RESOURCE THICKENUP CLEAR) POWD    Take 3,600 g by mouth as needed.   MINOXIDIL (LONITEN) 2.5 MG TABLET    5 mg by Gastrostomy Tube route 2 (two) times daily.   MULTIPLE VITAMINS TABLET    Take 1 tablet by mouth daily.   PANTOPRAZOLE (PROTONIX) 40 MG TABLET    Take 1 tablet (40 mg total) by mouth 2 (two) times daily.   SENNOSIDES-DOCUSATE SODIUM (SENOKOT-S) 8.6-50 MG TABLET    Take 1 tablet by mouth daily.   Modified Medications   No medications on file  Discontinued Medications   No medications on file    Review of Systems  Unable to perform ROS: Other  aphasia  Filed Vitals:   08/19/15 1710  BP: 134/84  Pulse: 73  Temp: 98.4 F (36.9 C)  Weight: 230 lb (104.327 kg)   Body mass index is 29.52 kg/(m^2).  Physical Exam  Constitutional: He appears well-developed and well-nourished.  Lying in bed in NAD  HENT:  Mouth/Throat: Oropharynx is clear and moist.  Eyes: Pupils are equal, round, and reactive to light. No scleral icterus.  Neck: Neck supple. Carotid bruit is not present. No thyromegaly present.  Cardiovascular: Normal rate, regular rhythm and intact distal  pulses.  Exam reveals no gallop and no friction rub.   Murmur (1/6 SEM) heard. no distal LE swelling. No calf TTP  Pulmonary/Chest: Effort normal and breath sounds normal. He has no wheezes. He has no rales. He exhibits no tenderness.  Abdominal: Soft. Bowel sounds are normal. He exhibits no distension, no abdominal bruit, no pulsatile midline mass and no mass. There is no tenderness. There is no rebound and no guarding.  Musculoskeletal: He exhibits edema and tenderness.  LUE and LLE swelling; LUE contracture  Lymphadenopathy:    He has no cervical adenopathy.  Neurological: He is alert.  Expressive aphasia  Skin: Skin is warm and dry. No rash noted. There is erythema (left elbow red and swollen).  Psychiatric: He has a normal mood and affect. His behavior is normal.     Labs reviewed: Admission on 08/08/2015, Discharged on 08/14/2015  No results displayed because visit has over 200 results.    Admission on 07/23/2015, Discharged on 07/24/2015  Component Date Value Ref Range Status  . WBC 07/23/2015 10.7* 4.0 - 10.5 K/uL Final  . RBC 07/23/2015 3.51* 4.22 - 5.81 MIL/uL Final  . Hemoglobin 07/23/2015 10.2* 13.0 - 17.0 g/dL Final  . HCT 07/23/2015 29.8* 39.0 - 52.0 % Final  . MCV 07/23/2015 84.9  78.0 - 100.0 fL Final  . MCH 07/23/2015 29.1  26.0 - 34.0 pg Final  . MCHC 07/23/2015 34.2  30.0 - 36.0 g/dL Final  . RDW 07/23/2015 13.9  11.5 - 15.5 % Final  . Platelets 07/23/2015 141* 150 - 400 K/uL Final  . Neutrophils Relative % 07/23/2015 79   Final  . Neutro Abs 07/23/2015 8.5* 1.7 - 7.7 K/uL Final  . Lymphocytes Relative 07/23/2015 9   Final  . Lymphs Abs 07/23/2015 1.0  0.7 - 4.0 K/uL Final  . Monocytes Relative 07/23/2015 11   Final  . Monocytes Absolute 07/23/2015 1.2* 0.1 - 1.0 K/uL Final  . Eosinophils Relative 07/23/2015 1   Final  . Eosinophils Absolute 07/23/2015 0.1  0.0 - 0.7 K/uL Final  . Basophils Relative 07/23/2015 0   Final  . Basophils Absolute 07/23/2015 0.0   0.0 - 0.1 K/uL Final  . Sodium 07/23/2015 137  135 - 145 mmol/L Final  . Potassium 07/23/2015 3.7  3.5 - 5.1 mmol/L Final  . Chloride 07/23/2015 104  101 - 111 mmol/L Final  . CO2 07/23/2015 19* 22 - 32 mmol/L Final  . Glucose, Bld 07/23/2015 130* 65 - 99 mg/dL Final  . BUN 07/23/2015 42* 6 - 20 mg/dL Final  . Creatinine, Ser 07/23/2015 3.11* 0.61 - 1.24 mg/dL Final  . Calcium 07/23/2015 9.3  8.9 - 10.3 mg/dL Final  . GFR calc non Af Amer 07/23/2015 21* >60 mL/min Final  . GFR calc Af Amer 07/23/2015 24* >60 mL/min Final   Comment: (NOTE) The eGFR has been calculated using the CKD EPI equation. This calculation has not been validated in all clinical situations. eGFR's persistently <60 mL/min signify possible Chronic Kidney Disease.   . Anion gap 07/23/2015 14  5 - 15 Final  . pH, Ven 07/23/2015 7.441* 7.250 - 7.300 Final  . pCO2, Ven 07/23/2015 28.6* 45.0 - 50.0 mmHg Final  . pO2, Ven 07/23/2015 78.0* 30.0 - 45.0 mmHg Final  . Bicarbonate 07/23/2015 19.5* 20.0 - 24.0 mEq/L Final  . TCO2 07/23/2015 20  0 - 100 mmol/L Final  . O2 Saturation 07/23/2015 96.0   Final  . Acid-base deficit 07/23/2015 4.0* 0.0 - 2.0 mmol/L Final  . Sample type 07/23/2015 VENOUS   Final    Dg Chest 2 View  07/23/2015   CLINICAL DATA:  Right-sided chest pain. Shortness of breath. Pneumonia.  EXAM: CHEST  2 VIEW  COMPARISON:  None.  FINDINGS: Heart size is within normal limits. Low lung volumes are seen. Elevation of right hemidiaphragm noted. Mild opacity is seen in right lower lobe, suspicious for pneumonia. No evidence of pneumothorax or pleural effusion. Gaseous distention of visualized colon seen in upper abdomen.  IMPRESSION: Low lung volumes and elevation of right hemidiaphragm limit evaluation. Right  lower lobe opacity suspicious for pneumonia.  Gaseous distention of visualized colon.   Electronically Signed   By: Earle Gell M.D.   On: 07/23/2015 21:55   Ct Head Wo Contrast  08/08/2015   CLINICAL DATA:   Increased confusion, altered mental status, more lethargic, history of prior stroke with LEFT hemi para cysts and facial droop, dysphagia, hypertension, diabetes mellitus  EXAM: CT HEAD WITHOUT CONTRAST  TECHNIQUE: Contiguous axial images were obtained from the base of the skull through the vertex without intravenous contrast.  COMPARISON:  None  FINDINGS: Generalized atrophy.  Minimal ex vacuo dilatation of the RIGHT lateral ventricle versus LEFT.  No midline shift or mass effect.  Low-attenuation identified in RIGHT hemisphere corresponding to a prior RIGHT MCA infarct.  No intracranial hemorrhage, mass lesion, or evidence of acute infarction.  Underlying small vessel chronic ischemic changes of deep cerebral white matter.  No extra-axial fluid collections.  Opacified sphenoid sinus.  No acute osseous findings.  RIGHT MCA stent identified.  IMPRESSION: Prior RIGHT MCA territory infarct.  Small vessel chronic ischemic changes of deep cerebral white matter.  No acute intracranial abnormalities.  Sphenoid sinus opacification.   Electronically Signed   By: Lavonia Dana M.D.   On: 08/08/2015 16:40   Dg Op Swallowing Func-medicare/speech Path  07/24/2015    Objective Swallowing Evaluation:   Modified Barium Swallow Patient Details  Name: EVERT WENRICH MRN: [id number] Date of Birth: 12-07-1960  Today's Date: 07/24/2015 Time: SLP Start Time (ACUTE ONLY): 1110-SLP Stop Time (ACUTE ONLY): 1130 SLP Time Calculation (min) (ACUTE ONLY): 20 min  Past Medical History:  Past Medical History  Diagnosis Date  . Stroke   . Hypertension   . Hyperlipidemia   . Diabetes mellitus without complication   . Fall at home   . Hemiparesis affecting left side as late effect of stroke   . Dysphagia as late effect of cerebrovascular disease   . Acute respiratory failure   . Cerebral edema    Past Surgical History:  Past Surgical History  Procedure Laterality Date  . Gastrostomy tube placement  05-19-15  . Mechanical thrombectomy angioplasty  stenet placement     HPI:  Other Pertinent Information: Pt is a 54 yo male with a PMH of  hypertension, dyslipidemia, diabetes mellitus, CVA with left-sided  hemiparesis, dysphasia, and acute respiratory failure. Presented to the ED  from [facility] for further evaluation of right flank pain, symptoms  since 07/20/15. CXR on 07/21/15 indicated aspiration pna. Pt's pain has been  worsening, scheduled for MBS to objectively evaluate swallow. Per pt  report he drank big gulps of Sprite, he currently is drinking thin  liquids, small sips, and a mixed (fine chop/ puree) diet.   No Data Recorded  Assessment / Plan / Recommendation CHL IP CLINICAL IMPRESSIONS 07/24/2015  Therapy Diagnosis Moderate oral phase dysphagia;Mild pharyngeal phase  dysphagia  Clinical Impression Pt presents with moderate oral dysphagia and mild  pharyngeal dysphagia characterized by prolonged oral mastication,  decreased bolus cohesion, and delayed swallow initiation to valleculae and  pyriform sinuses. Upon further review of video, no laryngeal penetration  or aspiration observed with boluses of thin liquids. No laryngeal residue  present after swallow, however when fluoro turned back on, pyriform  residue was present (x1), could be due to oral residue or esophageal  backflow. No esophageal abnormalities were observed with esophageal scan  (Radiologist present). Pt responded to moderate verbal and tactile cues  for small sips and swallow again to  clear mild oral residue. SLP  recommends continuation with current diet; fine chop and thin liquid with  small sips/bites and intermittent second swallow with full supervision to  assist with feeding and cuing for compensations.      CHL IP TREATMENT RECOMMENDATION 07/24/2015  Treatment Recommendations Defer treatment plan to SLP at (Comment);Other  (Comment)     CHL IP DIET RECOMMENDATION 07/24/2015  SLP Diet Recommendations Dysphagia 2 (Fine chop);Thin  Liquid Administration via (None)  Medication  Administration Crushed with puree  Compensations Slow rate;Small sips/bites;Clear throat  intermittently;Multiple dry swallows after each bite/sip  Postural Changes and/or Swallow Maneuvers (None)     No flowsheet data found.   No flowsheet data found.   No flowsheet data found.   Pertinent Vitals/Pain none    SLP Swallow Goals No flowsheet data found.  No flowsheet data found.    CHL IP REASON FOR REFERRAL 07/24/2015  Reason for Referral Objectively evaluate swallowing function                        Houston Siren 07/24/2015, 2:37 PM   Orbie Pyo Colvin Caroli.[employer] [phone]       CLINICAL DATA:  Aspiration pneumonia, history of stroke, recent PEG tube removal  EXAM: MODIFIED BARIUM SWALLOW  TECHNIQUE: Different consistencies of barium were administered orally to the patient by the Speech Pathologist. Imaging of the pharynx was performed in the lateral projection.  FLUOROSCOPY TIME:  Fluoroscopy Time:  2 minutes 24 seconds  Number of Acquired Images:  22  COMPARISON:  None.  FINDINGS: Thin liquid- Premature spill with delayed swallow trigger. No definite penetration or aspiration.  Pure- within normal limits  Cracker-within normal limits  Pure with cracker- within normal limits  Barium tablet - unable to successfully swallow tablet with applesauce.  IMPRESSION: Premature spill with delayed swallow trigger of thin liquid.  No definite penetration or aspiration.  Please refer to the Speech Pathologists report for complete details and recommendations.   Electronically Signed   By: Julian Hy M.D.   On: 07/24/2015 13:51   Dg Chest Port 1 View  08/08/2015   CLINICAL DATA:  Acute onset central chest pain today.  EXAM: PORTABLE CHEST 1 VIEW  COMPARISON:  07/23/2015  FINDINGS: Heart size remains normal. Right lower lobe airspace disease is again seen, although there improved aeration of the right lung base. This remains suspicious for pneumonia.  IMPRESSION: Persistent asymmetric right lower lung  airspace opacity, suspicious for pneumonia.   Electronically Signed   By: Earle Gell M.D.   On: 08/08/2015 15:39   Dg Swallowing Func-speech Pathology  08/12/2015    Objective Swallowing Evaluation:    Patient Details  Name: JEROMEY KRUER MRN: [id number] Date of Birth: 05/11/1961  Today's Date: 08/12/2015 Time: SLP Start Time (ACUTE ONLY): 0105-SLP Stop Time (ACUTE ONLY): 0130 SLP Time Calculation (min) (ACUTE ONLY): 25 min  Past Medical History:  Past Medical History  Diagnosis Date  . Stroke ([place])   . Hypertension   . Hyperlipidemia   . Diabetes mellitus without complication ([place])   . Fall at home   . Hemiparesis affecting left side as late effect of stroke ([place])   . Dysphagia as late effect of cerebrovascular disease   . Acute respiratory failure ([place])   . Cerebral edema [facility])    Past Surgical History:  Past Surgical History  Procedure Laterality Date  . Gastrostomy tube placement  05-19-15  . Mechanical thrombectomy  angioplasty stenet placement     HPI:  Other Pertinent Information: Pt is a 54 y.o. male presented to ED 9/30  with GI bleed. Recently admitted for CVA with residual L hemiplegia. Pt  had feeding tube placed following stroke which was removed August 2016. Pt  had MBS on 07/24/15 with recommendations for dysphagia 2 diet/ thin liquids  with cues for small sips and intermittent 2nd swallow and full  supervision; no penetration/ aspiration was observed on the MBS. CXR  showed persistent RLL airspace opacity suspicious for PNA. Bedside swallow  eval ordered to re-assess swallow function given apparent hx of recurrent  aspiration PNAs.   No Data Recorded  Assessment / Plan / Recommendation CHL IP CLINICAL IMPRESSIONS 08/12/2015  Therapy Diagnosis Moderate oral phase dysphagia;Moderate pharyngeal phase  dysphagia  Clinical Impression Pt presents with moderate oral and oropharyngeal  dysphagia characterized reduced timing/ coordination of swallow sequence  and aspiration/penetration of thin liquids. Pt  exhibits prolonged oral  mastication, delayed anterior to posterior transit, left oral pocketing,  and overall oral residue which spills into the valleculae after the  swallow. Due to poor oral movement and bolus cohesion pt did not swallow  pill, recommend meds crushed in puree. Pt has a delayed swallow initiation  to the pyriforms with thin and nectar liquids. Pt sensed aspiration (x1)  and sensed penetration during/after the swallow with thin liquids. Pt's  tolerated thin liquids with small sips, intermittent sensed penetration,  expelled with throat clear. SLP instructed client to tuck chin, used straw  to facilitate chin tuck, pt was unrelaible to continue to follow  instruction, recommend trialing this strategy in treatment as it was  successful with small sips. SLP recommends dysphagia 2 (fine chop), nectar  thick liquids, meds crushed in puree with the following compensations:  small sips, small bites, swallow 2-3x after bolus, intermittently check  for pocketing, and sitting upright. SLP will f/u with check for diet  tolerance, and upgrade trials of thin with therapeutic exercise.       CHL IP TREATMENT RECOMMENDATION 08/12/2015  Treatment Recommendations Therapy as outlined in treatment plan below     CHL IP DIET RECOMMENDATION 08/12/2015  SLP Diet Recommendations Dysphagia 2 (Fine chop);Nectar  Liquid Administration via (None)  Medication Administration Crushed with puree  Compensations Slow rate;Small sips/bites;Check for pocketing;Clear throat  intermittently;Multiple dry swallows after each bite/sip;Minimize  environmental distractions  Postural Changes and/or Swallow Maneuvers (None)     CHL IP OTHER RECOMMENDATIONS 08/12/2015  Recommended Consults (None)  Oral Care Recommendations Oral care QID  Other Recommendations Order thickener from pharmacy     No flowsheet data found.   CHL IP FREQUENCY AND DURATION 08/12/2015  Speech Therapy Frequency (ACUTE ONLY) min 2x/week  Treatment Duration 2 weeks      Pertinent Vitals/Pain NA    SLP Swallow Goals No flowsheet data found.  No flowsheet data found.    CHL IP REASON FOR REFERRAL 08/12/2015  Reason for Referral Objectively evaluate swallowing function     CHL IP ORAL PHASE 08/12/2015  Lips (None)  Tongue (None)  Mucous membranes (None)  Nutritional status (None)  Other (None)  Oxygen therapy (None)  Oral Phase Impaired  Oral - Pudding Teaspoon (None)  Oral - Pudding Cup (None)  Oral - Honey Teaspoon (None)  Oral - Honey Cup (None)  Oral - Honey Syringe (None)  Oral - Nectar Teaspoon (None)  Oral - Nectar Cup (None)  Oral - Nectar Straw (None)  Oral Artist (  None)  Oral - Ice Chips (None)  Oral - Thin Teaspoon (None)  Oral - Thin Cup (None)  Oral - Thin Straw (None)  Oral - Thin Syringe (None)  Oral - Puree (None)  Oral - Mechanical Soft (None)  Oral - Regular (None)  Oral - Multi-consistency (None)  Oral - Pill (None)  Oral Phase - Comment (None)      CHL IP PHARYNGEAL PHASE 08/12/2015  Pharyngeal Phase Impaired  Pharyngeal - Pudding Teaspoon (None)  Penetration/Aspiration details (pudding teaspoon) (None)  Pharyngeal - Pudding Cup (None)  Penetration/Aspiration details (pudding cup) (None)  Pharyngeal - Honey Teaspoon (None)  Penetration/Aspiration details (honey teaspoon) (None)  Pharyngeal - Honey Cup (None)  Penetration/Aspiration details (honey cup) (None)  Pharyngeal - Honey Syringe (None)  Penetration/Aspiration details (honey syringe) (None)  Pharyngeal - Nectar Teaspoon (None)  Penetration/Aspiration details (nectar teaspoon) (None)  Pharyngeal - Nectar Cup (None)  Penetration/Aspiration details (nectar cup) (None)  Pharyngeal - Nectar Straw (None)  Penetration/Aspiration details (nectar straw) (None)  Pharyngeal - Nectar Syringe (None)  Penetration/Aspiration details (nectar syringe) (None)  Pharyngeal - Ice Chips (None)  Penetration/Aspiration details (ice chips) (None)  Pharyngeal - Thin Teaspoon (None)  Penetration/Aspiration details (thin  teaspoon) (None)  Pharyngeal - Thin Cup (None)  Penetration/Aspiration details (thin cup) (None)  Pharyngeal - Thin Straw (None)  Penetration/Aspiration details (thin straw) (None)  Pharyngeal - Thin Syringe (None)  Penetration/Aspiration details (thin syringe') (None)  Pharyngeal - Puree (None)  Penetration/Aspiration details (puree) (None)  Pharyngeal - Mechanical Soft (None)  Penetration/Aspiration details (mechanical soft) (None)  Pharyngeal - Regular (None)  Penetration/Aspiration details (regular) (None)  Pharyngeal - Multi-consistency (None)  Penetration/Aspiration details (multi-consistency) (None)  Pharyngeal - Pill (None)  Penetration/Aspiration details (pill) (None)  Pharyngeal Comment (None)      CHL IP CERVICAL ESOPHAGEAL PHASE 08/12/2015  Cervical Esophageal Phase WFL  Pudding Teaspoon (None)  Pudding Cup (None)  Honey Teaspoon (None)  Honey Cup (None)  Honey Straw (None)  Nectar Teaspoon (None)  Nectar Cup (None)  Nectar Straw (None)  Nectar Sippy Cup (None)  Thin Teaspoon (None)  Thin Cup (None)  Thin Straw (None)  Thin Sippy Cup (None)  Cervical Esophageal Comment (None)    CHL IP GO 07/24/2015  Functional Assessment Tool Used skilled clinical judgement  Functional Limitations Swallowing  Swallow Current Status (M8413) CJ  Swallow Goal Status (K4401) CJ  Swallow Discharge Status (U2725) CJ  Motor Speech Current Status (D6644) (None)  Motor Speech Goal Status (I3474) (None)  Motor Speech Goal Status (Q5956) (None)  Spoken Language Comprehension Current Status (L8756) (None)  Spoken Language Comprehension Goal Status (E3329) (None)  Spoken Language Comprehension Discharge Status (J1884) (None)  Spoken Language Expression Current Status (Z6606) (None)  Spoken Language Expression Goal Status (T0160) (None)  Spoken Language Expression Discharge Status [phone] (None)  Attention Current Status (T5573) (None)  Attention Goal Status (U2025) (None)  Attention Discharge Status (K2706) (None)  Memory Current  Status (C3762) (None)  Memory Goal Status (G3151) (None)  Memory Discharge Status (V6160) (None)  Voice Current Status (V3710) (None)  Voice Goal Status (G2694) (None)  Voice Discharge Status (W5462) (None)  Other Speech-Language Pathology Functional Limitation [phone]) (None)  Other Speech-Language Pathology Functional Limitation Goal Status (K9381)  (None)  Other Speech-Language Pathology Functional Limitation Discharge Status  [phone]) (None)          Completed by Belva Bertin, SLP Student Supervised and reviewed by Herbie Baltimore MA CCC-SLP  DeBlois, Katherene Ponto 08/12/2015, 2:25 PM  Assessment/Plan   ICD-9-CM ICD-10-CM   1. Gastrointestinal hemorrhage with melena 578.1 K92.1   2. Hemiparesis affecting left side as late effect of stroke ([place]) 438.20 I69.354   3. H/O: stroke with residual effects 438.9 I69.30   4. Acute blood loss anemia 285.1 D62   5. Type II diabetes mellitus with neurological manifestations ([place]) 250.60 E11.49   6. Essential hypertension, malignant 401.0 I10   7. Depression due to stroke ([place]) 311 I63.9    434.91 F06.31   8. Dyslipidemia 272.4 E78.5   9. Dysphagia as late effect of stroke 438.82 I69.391     keep LUE elevated on pillow to prevent breakdown of elbow skin and reduce pain  Cont current meds as ordered  PT/OT/ST as ordered  VS qshift  F/u with specialists as scheduled  GOAL: short term rehab and d/c home when medically appropriate. Communicated with pt and nursing.  Will follow  Suyash Amory S. Perlie Gold  [facility] [address] [place], [place] [address] [phone] Cell (Monday-Friday 8 AM - 5 PM) [phone] After 5 PM and follow prompts

## 2015-09-03 DIAGNOSIS — A630 Anogenital (venereal) warts: Secondary | ICD-10-CM | POA: Diagnosis not present

## 2015-09-06 DIAGNOSIS — I67848 Other cerebrovascular vasospasm and vasoconstriction: Secondary | ICD-10-CM | POA: Diagnosis not present

## 2015-09-06 DIAGNOSIS — I63511 Cerebral infarction due to unspecified occlusion or stenosis of right middle cerebral artery: Secondary | ICD-10-CM | POA: Diagnosis not present

## 2015-09-07 ENCOUNTER — Inpatient Hospital Stay (HOSPITAL_COMMUNITY)
Admission: EM | Admit: 2015-09-07 | Discharge: 2015-09-12 | DRG: 682 | Disposition: A | Discharge status: Skilled Nursing Facility | Payer: Medicare Other | Attending: Family Medicine | Admitting: Family Medicine

## 2015-09-07 ENCOUNTER — Emergency Department (HOSPITAL_COMMUNITY): Payer: Medicare Other

## 2015-09-07 ENCOUNTER — Encounter (HOSPITAL_COMMUNITY): Payer: Self-pay | Admitting: Emergency Medicine

## 2015-09-07 DIAGNOSIS — F09 Unspecified mental disorder due to known physiological condition: Secondary | ICD-10-CM | POA: Diagnosis not present

## 2015-09-07 DIAGNOSIS — E860 Dehydration: Secondary | ICD-10-CM | POA: Diagnosis not present

## 2015-09-07 DIAGNOSIS — E876 Hypokalemia: Secondary | ICD-10-CM | POA: Diagnosis present

## 2015-09-07 DIAGNOSIS — E782 Mixed hyperlipidemia: Secondary | ICD-10-CM | POA: Diagnosis not present

## 2015-09-07 DIAGNOSIS — R4702 Dysphasia: Secondary | ICD-10-CM | POA: Diagnosis not present

## 2015-09-07 DIAGNOSIS — R410 Disorientation, unspecified: Secondary | ICD-10-CM | POA: Insufficient documentation

## 2015-09-07 DIAGNOSIS — R4189 Other symptoms and signs involving cognitive functions and awareness: Secondary | ICD-10-CM | POA: Insufficient documentation

## 2015-09-07 DIAGNOSIS — Z7902 Long term (current) use of antithrombotics/antiplatelets: Secondary | ICD-10-CM | POA: Diagnosis not present

## 2015-09-07 DIAGNOSIS — K922 Gastrointestinal hemorrhage, unspecified: Secondary | ICD-10-CM | POA: Diagnosis not present

## 2015-09-07 DIAGNOSIS — N179 Acute kidney failure, unspecified: Principal | ICD-10-CM | POA: Diagnosis present

## 2015-09-07 DIAGNOSIS — K219 Gastro-esophageal reflux disease without esophagitis: Secondary | ICD-10-CM | POA: Diagnosis not present

## 2015-09-07 DIAGNOSIS — I951 Orthostatic hypotension: Secondary | ICD-10-CM | POA: Diagnosis present

## 2015-09-07 DIAGNOSIS — F0151 Vascular dementia with behavioral disturbance: Secondary | ICD-10-CM | POA: Diagnosis present

## 2015-09-07 DIAGNOSIS — E11649 Type 2 diabetes mellitus with hypoglycemia without coma: Secondary | ICD-10-CM | POA: Diagnosis present

## 2015-09-07 DIAGNOSIS — I69354 Hemiplegia and hemiparesis following cerebral infarction affecting left non-dominant side: Secondary | ICD-10-CM

## 2015-09-07 DIAGNOSIS — Z955 Presence of coronary angioplasty implant and graft: Secondary | ICD-10-CM | POA: Diagnosis not present

## 2015-09-07 DIAGNOSIS — I69991 Dysphagia following unspecified cerebrovascular disease: Secondary | ICD-10-CM

## 2015-09-07 DIAGNOSIS — R531 Weakness: Secondary | ICD-10-CM | POA: Diagnosis not present

## 2015-09-07 DIAGNOSIS — E1149 Type 2 diabetes mellitus with other diabetic neurological complication: Secondary | ICD-10-CM | POA: Diagnosis present

## 2015-09-07 DIAGNOSIS — J168 Pneumonia due to other specified infectious organisms: Secondary | ICD-10-CM | POA: Diagnosis not present

## 2015-09-07 DIAGNOSIS — Z7982 Long term (current) use of aspirin: Secondary | ICD-10-CM

## 2015-09-07 DIAGNOSIS — I69391 Dysphagia following cerebral infarction: Secondary | ICD-10-CM | POA: Diagnosis not present

## 2015-09-07 DIAGNOSIS — I69154 Hemiplegia and hemiparesis following nontraumatic intracerebral hemorrhage affecting left non-dominant side: Secondary | ICD-10-CM | POA: Diagnosis not present

## 2015-09-07 DIAGNOSIS — Z79899 Other long term (current) drug therapy: Secondary | ICD-10-CM | POA: Diagnosis not present

## 2015-09-07 DIAGNOSIS — F329 Major depressive disorder, single episode, unspecified: Secondary | ICD-10-CM | POA: Diagnosis present

## 2015-09-07 DIAGNOSIS — G9341 Metabolic encephalopathy: Secondary | ICD-10-CM | POA: Diagnosis present

## 2015-09-07 DIAGNOSIS — I10 Essential (primary) hypertension: Secondary | ICD-10-CM | POA: Diagnosis present

## 2015-09-07 DIAGNOSIS — F03918 Unspecified dementia, unspecified severity, with other behavioral disturbance: Secondary | ICD-10-CM | POA: Insufficient documentation

## 2015-09-07 DIAGNOSIS — Z87891 Personal history of nicotine dependence: Secondary | ICD-10-CM | POA: Diagnosis not present

## 2015-09-07 DIAGNOSIS — I4581 Long QT syndrome: Secondary | ICD-10-CM | POA: Diagnosis not present

## 2015-09-07 DIAGNOSIS — E870 Hyperosmolality and hypernatremia: Secondary | ICD-10-CM | POA: Diagnosis present

## 2015-09-07 DIAGNOSIS — I63411 Cerebral infarction due to embolism of right middle cerebral artery: Secondary | ICD-10-CM | POA: Diagnosis not present

## 2015-09-07 DIAGNOSIS — K2971 Gastritis, unspecified, with bleeding: Secondary | ICD-10-CM | POA: Diagnosis not present

## 2015-09-07 DIAGNOSIS — F17290 Nicotine dependence, other tobacco product, uncomplicated: Secondary | ICD-10-CM | POA: Diagnosis not present

## 2015-09-07 DIAGNOSIS — F339 Major depressive disorder, recurrent, unspecified: Secondary | ICD-10-CM | POA: Diagnosis not present

## 2015-09-07 DIAGNOSIS — E785 Hyperlipidemia, unspecified: Secondary | ICD-10-CM | POA: Diagnosis present

## 2015-09-07 DIAGNOSIS — F0391 Unspecified dementia with behavioral disturbance: Secondary | ICD-10-CM | POA: Insufficient documentation

## 2015-09-07 DIAGNOSIS — W19XXXD Unspecified fall, subsequent encounter: Secondary | ICD-10-CM | POA: Diagnosis not present

## 2015-09-07 DIAGNOSIS — R1310 Dysphagia, unspecified: Secondary | ICD-10-CM | POA: Diagnosis not present

## 2015-09-07 DIAGNOSIS — R42 Dizziness and giddiness: Secondary | ICD-10-CM | POA: Diagnosis not present

## 2015-09-07 DIAGNOSIS — W19XXXA Unspecified fall, initial encounter: Secondary | ICD-10-CM | POA: Diagnosis present

## 2015-09-07 DIAGNOSIS — M6281 Muscle weakness (generalized): Secondary | ICD-10-CM | POA: Diagnosis not present

## 2015-09-07 DIAGNOSIS — E119 Type 2 diabetes mellitus without complications: Secondary | ICD-10-CM | POA: Diagnosis not present

## 2015-09-07 DIAGNOSIS — R4182 Altered mental status, unspecified: Secondary | ICD-10-CM | POA: Diagnosis not present

## 2015-09-07 LAB — CBC WITH DIFFERENTIAL/PLATELET
BASOS ABS: 0 10*3/uL (ref 0.0–0.1)
BASOS PCT: 1 %
Eosinophils Absolute: 0.1 10*3/uL (ref 0.0–0.7)
Eosinophils Relative: 2 %
HEMATOCRIT: 29.2 % — AB (ref 39.0–52.0)
Hemoglobin: 9.1 g/dL — ABNORMAL LOW (ref 13.0–17.0)
LYMPHS PCT: 27 %
Lymphs Abs: 1.4 10*3/uL (ref 0.7–4.0)
MCH: 26.5 pg (ref 26.0–34.0)
MCHC: 31.2 g/dL (ref 30.0–36.0)
MCV: 85.1 fL (ref 78.0–100.0)
Monocytes Absolute: 0.4 10*3/uL (ref 0.1–1.0)
Monocytes Relative: 9 %
NEUTROS ABS: 3.2 10*3/uL (ref 1.7–7.7)
NEUTROS PCT: 61 %
Platelets: 229 10*3/uL (ref 150–400)
RBC: 3.43 MIL/uL — AB (ref 4.22–5.81)
RDW: 15.5 % (ref 11.5–15.5)
WBC: 5.1 10*3/uL (ref 4.0–10.5)

## 2015-09-07 LAB — COMPREHENSIVE METABOLIC PANEL
ALBUMIN: 3.2 g/dL — AB (ref 3.5–5.0)
ALT: 7 U/L — ABNORMAL LOW (ref 17–63)
AST: 16 U/L (ref 15–41)
Alkaline Phosphatase: 133 U/L — ABNORMAL HIGH (ref 38–126)
Anion gap: 14 (ref 5–15)
BUN: 44 mg/dL — ABNORMAL HIGH (ref 6–20)
CALCIUM: 10 mg/dL (ref 8.9–10.3)
CO2: 27 mmol/L (ref 22–32)
CREATININE: 4.47 mg/dL — AB (ref 0.61–1.24)
Chloride: 105 mmol/L (ref 101–111)
GFR, EST AFRICAN AMERICAN: 16 mL/min — AB (ref >60)
GFR, EST NON AFRICAN AMERICAN: 13 mL/min — AB (ref >60)
Glucose, Bld: 93 mg/dL (ref 65–99)
Potassium: 4 mmol/L (ref 3.5–5.1)
Sodium: 146 mmol/L — ABNORMAL HIGH (ref 135–145)
TOTAL PROTEIN: 7.8 g/dL (ref 6.5–8.1)
Total Bilirubin: 0.5 mg/dL (ref 0.3–1.2)

## 2015-09-07 LAB — AMMONIA: Ammonia: 19 umol/L (ref 9–35)

## 2015-09-07 LAB — GLUCOSE, CAPILLARY
GLUCOSE-CAPILLARY: 81 mg/dL (ref 65–99)
Glucose-Capillary: 91 mg/dL (ref 65–99)
Glucose-Capillary: 93 mg/dL (ref 65–99)

## 2015-09-07 LAB — CREATININE, URINE, RANDOM: CREATININE, URINE: 291.88 mg/dL

## 2015-09-07 LAB — URINALYSIS W MICROSCOPIC (NOT AT ARMC)
BILIRUBIN URINE: NEGATIVE
GLUCOSE, UA: NEGATIVE mg/dL
KETONES UR: NEGATIVE mg/dL
Nitrite: NEGATIVE
PH: 5.5 (ref 5.0–8.0)
Protein, ur: NEGATIVE mg/dL
Specific Gravity, Urine: 1.017 (ref 1.005–1.030)
UROBILINOGEN UA: 1 mg/dL (ref 0.0–1.0)

## 2015-09-07 LAB — TROPONIN I

## 2015-09-07 LAB — CREATININE, SERUM
CREATININE: 3.92 mg/dL — AB (ref 0.61–1.24)
GFR, EST AFRICAN AMERICAN: 18 mL/min — AB (ref >60)
GFR, EST NON AFRICAN AMERICAN: 16 mL/min — AB (ref >60)

## 2015-09-07 LAB — PROTIME-INR
INR: 1.22 (ref 0.00–1.49)
PROTHROMBIN TIME: 15.5 s — AB (ref 11.6–15.2)

## 2015-09-07 LAB — CBG MONITORING, ED: Glucose-Capillary: 77 mg/dL (ref 65–99)

## 2015-09-07 LAB — SODIUM, URINE, RANDOM: Sodium, Ur: 95 mmol/L

## 2015-09-07 MED ORDER — SENNOSIDES-DOCUSATE SODIUM 8.6-50 MG PO TABS
1.0000 | ORAL_TABLET | Freq: Every day | ORAL | Status: DC
  Administered 2015-09-07 – 2015-09-12 (×6): 1 via ORAL
  Filled 2015-09-07 (×6): qty 1

## 2015-09-07 MED ORDER — SODIUM CHLORIDE 0.9 % IV BOLUS (SEPSIS)
1000.0000 mL | Freq: Once | INTRAVENOUS | Status: AC
  Administered 2015-09-07: 1000 mL via INTRAVENOUS

## 2015-09-07 MED ORDER — CLOPIDOGREL BISULFATE 75 MG PO TABS
75.0000 mg | ORAL_TABLET | Freq: Every day | ORAL | Status: DC
  Administered 2015-09-07 – 2015-09-12 (×6): 75 mg via ORAL
  Filled 2015-09-07 (×5): qty 1

## 2015-09-07 MED ORDER — SODIUM CHLORIDE 0.9 % IJ SOLN
3.0000 mL | Freq: Two times a day (BID) | INTRAMUSCULAR | Status: DC
  Administered 2015-09-07 – 2015-09-08 (×3): 3 mL via INTRAVENOUS

## 2015-09-07 MED ORDER — CHLORHEXIDINE GLUCONATE CLOTH 2 % EX PADS
6.0000 | MEDICATED_PAD | Freq: Every day | CUTANEOUS | Status: AC
  Administered 2015-09-08 – 2015-09-12 (×5): 6 via TOPICAL

## 2015-09-07 MED ORDER — MUPIROCIN 2 % EX OINT
1.0000 | TOPICAL_OINTMENT | Freq: Two times a day (BID) | CUTANEOUS | Status: AC
Start: 2015-09-07 — End: 2015-09-12
  Administered 2015-09-07 – 2015-09-12 (×10): 1 via NASAL
  Filled 2015-09-07 (×4): qty 22

## 2015-09-07 MED ORDER — SODIUM CHLORIDE 0.9 % IV SOLN
Freq: Once | INTRAVENOUS | Status: AC
  Administered 2015-09-07: 12:00:00 via INTRAVENOUS

## 2015-09-07 MED ORDER — SODIUM CHLORIDE 0.9 % IV SOLN: INTRAVENOUS | Status: DC

## 2015-09-07 MED ORDER — PANTOPRAZOLE SODIUM 40 MG PO TBEC
40.0000 mg | DELAYED_RELEASE_TABLET | Freq: Two times a day (BID) | ORAL | Status: DC
  Administered 2015-09-07 – 2015-09-12 (×10): 40 mg via ORAL
  Filled 2015-09-07 (×10): qty 1

## 2015-09-07 MED ORDER — ASPIRIN 325 MG PO TABS
325.0000 mg | ORAL_TABLET | Freq: Every day | ORAL | Status: DC
  Administered 2015-09-07 – 2015-09-12 (×6): 325 mg via ORAL
  Filled 2015-09-07 (×5): qty 1

## 2015-09-07 MED ORDER — ATORVASTATIN CALCIUM 80 MG PO TABS
80.0000 mg | ORAL_TABLET | Freq: Every day | ORAL | Status: DC
  Administered 2015-09-07 – 2015-09-12 (×6): 80 mg via ORAL
  Filled 2015-09-07 (×6): qty 1

## 2015-09-07 MED ORDER — FENOFIBRATE 54 MG PO TABS
54.0000 mg | ORAL_TABLET | Freq: Every day | ORAL | Status: DC
  Administered 2015-09-07 – 2015-09-12 (×6): 54 mg via ORAL
  Filled 2015-09-07 (×7): qty 1

## 2015-09-07 MED ORDER — DEXTROSE-NACL 5-0.45 % IV SOLN
INTRAVENOUS | Status: DC
  Administered 2015-09-07 – 2015-09-08 (×4): via INTRAVENOUS

## 2015-09-07 NOTE — ED Notes (Signed)
Placed bed fall alarm

## 2015-09-07 NOTE — ED Provider Notes (Signed)
CSN: [id number]     Arrival date & time 09/07/15  0808 History   First MD Initiated Contact with Patient 09/07/15 [phone]     Chief Complaint  Patient presents with  . Altered Mental Status     (Consider location/radiation/quality/duration/timing/severity/associated sxs/prior Treatment) HPI 54 year old male who presents with altered mental status. Presents from [facility]. Has a history of diabetes, hypertension, hyperlipidemia and stroke with residual left-sided hemiparesis and dysphasia. Nursing facility noticed that patient was not behaving normally this morning and so sent him to the ED for evaluation. The patient is only oriented to name, but states that he has no complaints.  His nurse states that he was behaving abnormally yesterday. Less oriented than usual. Yesterday, had two falls, and he stated that he was lightheaded. Low sat's yesterday too. They were concerned for dehydration as on pureed diet and not eating well recently  He was recently discharged from the hospital at the beginning of October for management of GI bleed. Is currently to still taking Plavix. Baseline hemoglobin 8.5 at time of discharge.  Past Medical History  Diagnosis Date  . Stroke ([place])   . Hypertension   . Hyperlipidemia   . Diabetes mellitus without complication ([place])   . Fall at home   . Hemiparesis affecting left side as late effect of stroke ([place])   . Dysphagia as late effect of cerebrovascular disease   . Acute respiratory failure ([place])   . Cerebral edema [facility])    Past Surgical History  Procedure Laterality Date  . Gastrostomy tube placement  05-19-15  . Mechanical thrombectomy angioplasty stenet placement    . Esophagogastroduodenoscopy N/A 08/12/2015    Procedure: ESOPHAGOGASTRODUODENOSCOPY (EGD);  Surgeon: Ladene Artist, MD;  Location: [facility] ENDOSCOPY;  Service: Endoscopy;  Laterality: N/A;   Family History  Problem Relation Age of Onset  . Family history unknown:  Yes   Social History  Substance Use Topics  . Smoking status: Former Smoker    Types: Cigarettes  . Smokeless tobacco: None  . Alcohol Use: No    Review of Systems  Unable to perform ROS: Mental status change      Allergies  Review of patient's allergies indicates no known allergies.  Home Medications   Prior to Admission medications   Medication Sig Start Date End Date Taking? Authorizing Provider  amantadine (SYMMETREL) 100 MG capsule Take 200 mg by mouth 2 (two) times daily.    Yes Historical Provider, MD  amLODipine (NORVASC) 10 MG tablet Take 10 mg by mouth daily.    Yes Historical Provider, MD  aspirin 325 MG tablet Take 1 tablet (325 mg total) by mouth daily. 08/14/15  Yes Mercy Riding, MD  atorvastatin (LIPITOR) 80 MG tablet Take 1 tablet (80 mg total) by mouth daily at 6 PM. 08/14/15  Yes Mercy Riding, MD  cloNIDine (CATAPRES) 0.1 MG tablet Take 0.1 mg by mouth 2 (two) times daily.   Yes Historical Provider, MD  clopidogrel (PLAVIX) 75 MG tablet 75 mg by Gastrostomy Tube route daily.    Yes Historical Provider, MD  feeding supplement, ENSURE ENLIVE, (ENSURE ENLIVE) LIQD Take 237 mLs by mouth 2 (two) times daily between meals. 08/14/15  Yes Mercy Riding, MD  Fenofibrate 40 MG TABS 1 tablet by Gastrostomy Tube route every evening.    Yes Historical Provider, MD  Fenofibrate 40 MG TABS Take 40 mg by mouth 2 (two) times daily.   Yes Historical Provider, MD  FLUoxetine (PROZAC)  20 MG capsule 20 mg by Gastrostomy Tube route daily.    Yes Historical Provider, MD  hydrALAZINE (APRESOLINE) 10 MG tablet Take 10 mg by mouth every 8 (eight) hours.    Yes Historical Provider, MD  hydrochlorothiazide (HYDRODIURIL) 25 MG tablet Take 25 mg by mouth daily.    Yes Historical Provider, MD  HYDROcodone-acetaminophen (NORCO/VICODIN) 5-325 MG per tablet Take one tablet by mouth every 6 hours as needed for pain. Not to exceed 3000mg  of APAP from all sources/24h 07/23/15  Yes Gildardo Cranker, DO   lisinopril (PRINIVIL,ZESTRIL) 40 MG tablet Take 40 mg by mouth daily.   Yes Historical Provider, MD  Maltodextrin-Xanthan Gum (RESOURCE THICKENUP CLEAR) POWD Take 3,600 g by mouth as needed. 08/14/15  Yes Mercy Riding, MD  minoxidil (LONITEN) 2.5 MG tablet Take 5 mg by mouth 2 (two) times daily.    Yes Historical Provider, MD  Multiple Vitamins tablet Take 1 tablet by mouth daily.   Yes Historical Provider, MD  pantoprazole (PROTONIX) 40 MG tablet Take 1 tablet (40 mg total) by mouth 2 (two) times daily. 08/14/15  Yes Mercy Riding, MD  sennosides-docusate sodium (SENOKOT-S) 8.6-50 MG tablet Take 1 tablet by mouth daily.    Yes Historical Provider, MD  clarithromycin (BIAXIN) 500 MG tablet Take 1 tablet (500 mg total) by mouth every 12 (twelve) hours. 08/14/15   Mercy Riding, MD   BP 114/72 mmHg  Pulse 75  Temp(Src) 98.3 F (36.8 C) (Oral)  Resp 20  Wt 230 lb (104.327 kg)  SpO2 95% Physical Exam Physical Exam  Nursing note and vitals reviewed. Constitutional: Well developed,chronically ill appearing,  non-toxic, and in no acute distress Head: Normocephalic and atraumatic.  Mouth/Throat: Oropharynx is clear and moist.  Neck: Normal range of motion. Neck supple.  Cardiovascular: Normal rate and regular rhythm.   Pulmonary/Chest: Effort normal and breath sounds normal.  Abdominal: Soft. There is no tenderness. There is no rebound and no guarding.  Musculoskeletal: Normal range of motion.  Neurological: Alert, oriented to name but not time or place. Weak in the left lower extremity and left upper extremity, compared to the right side, which is baseline. Slightly dysarthric speech.  Skin: Skin is warm and dry.  Psychiatric: Cooperative  ED Course  Procedures (including critical care time) Labs Review Labs Reviewed  CBC WITH DIFFERENTIAL/PLATELET - Abnormal; Notable for the following:    RBC 3.43 (*)    Hemoglobin 9.1 (*)    HCT 29.2 (*)    All other components within normal limits   COMPREHENSIVE METABOLIC PANEL - Abnormal; Notable for the following:    Sodium 146 (*)    BUN 44 (*)    Creatinine, Ser 4.47 (*)    Albumin 3.2 (*)    ALT 7 (*)    Alkaline Phosphatase 133 (*)    GFR calc non Af Amer 13 (*)    GFR calc Af Amer 16 (*)    All other components within normal limits  URINALYSIS W MICROSCOPIC  CBG MONITORING, ED    Imaging Review Dg Chest 2 View  09/07/2015  CLINICAL DATA:  altered from norm. Pt normally wheels self to bathroom, has hx of CVA, also has dark urine per staff EXAM: CHEST - 2 VIEW COMPARISON:  08/08/2015 FINDINGS: Relatively low lung volumes with some subsegmental atelectasis at the right lung base, improved since previous. Heart size upper limits normal. Mildly tortuous thoracic aorta. No pneumothorax. No effusion. Mild spurring in the lower thoracic spine.  IMPRESSION: 1. Improving right lower lobe atelectasis or infiltrate. No acute or superimposed abnormality. Electronically Signed   By: Lucrezia Europe M.D.   On: 09/07/2015 09:43   Ct Head Wo Contrast  09/07/2015  CLINICAL DATA:  ALTERED MENTAL STATUS, NURSING FACILITY GIVES HX OF 2 FALLS YESTERDAY, LESS ORIENTED TODAY, HX DIABETES, HTN, CVA WITH LEFT SIDE HEMIPARESIS, DYSPHASIA, RECENT HOSPITALIZATION FOR GI BLEED, PLAVIX RX EXAM: CT HEAD WITHOUT CONTRAST TECHNIQUE: Contiguous axial images were obtained from the base of the skull through the vertex without intravenous contrast. Continued patient motion required rescanning x2. COMPARISON:  08/08/2015 FINDINGS: Right MCA stent. Atherosclerotic and physiologic intracranial calcifications. Old right MCA distribution infarct with encephalomalacia. No acute intracranial hemorrhage, midline shift, new parenchymal edema, mass or mass effect. There is moderate atrophy with prominence of ventricles and sulci which remain relatively symmetric. Acute infarct may be inapparent on noncontrast CT. CT bone windows reveal no calvarial lesion. IMPRESSION: 1. Negative for  bleed or other acute intracranial process. 2. Stable old right MCA distribution infarct without hemorrhagic change. Electronically Signed   By: Lucrezia Europe M.D.   On: 09/07/2015 09:47   I have personally reviewed and evaluated these images and lab results as part of my medical decision-making.   MDM   Final diagnoses:  Disorientation  Dehydration  Acute renal failure, unspecified acute renal failure type [facility])    54 year old male with history of CVA and residual left-sided deficits who presents from senior nursing facility with confusion and disorientation.  Appears disoriented to time and place on presentation. Otherwise, with baseline neurological deficits. Remainder of exam is unremarkable. Afebrile and hemodynamically stable. CT head without acute intracranial processes. Noted to have acute renal failure with an elevated creatinine of 4.46. No evidence of hyperkalemia, pulmonary edema or difficulty breathing, or any other symptoms requiring emergent dialysis. Is also hypernatremic to 146. History suggestive that renal failure is due to  Likely dehydration.   Given 1 L of fluids and placed on normal saline at 150 mL an hour. Discussed with family medicine, who will admit him for ongoing management.   Forde Dandy, MD 09/07/15 [phone]

## 2015-09-07 NOTE — Consult Note (Signed)
Reason for Consult:Altered mental status Referring Physician: McDiarmid  CC: Altered mental status  HPI: [name] [name] is an 54 y.o. male who presents with altered mental status and falls.  Per chart patient has been complaining of dizziness.  Fell twice last night.  It also appears that he is confused beyond his baseline cognitive issues that per chart arose from a stoke in July of this year.  Patient is unable to provide much in the way of history.    Past Medical History  Diagnosis Date  . Stroke ([place])   . Hypertension   . Hyperlipidemia   . Diabetes mellitus without complication ([place])   . Fall at home   . Hemiparesis affecting left side as late effect of stroke ([place])   . Dysphagia as late effect of cerebrovascular disease   . Acute respiratory failure ([place])   . Cerebral edema [facility])     Past Surgical History  Procedure Laterality Date  . Gastrostomy tube placement  05-19-15  . Mechanical thrombectomy angioplasty stenet placement    . Esophagogastroduodenoscopy N/A 08/12/2015    Procedure: ESOPHAGOGASTRODUODENOSCOPY (EGD);  Surgeon: Ladene Artist, MD;  Location: [facility] ENDOSCOPY;  Service: Endoscopy;  Laterality: N/A;    Family History  Problem Relation Age of Onset  . Family history unknown: Yes    Social History:  reports that he has quit smoking. His smoking use included Cigarettes. He does not have any smokeless tobacco history on file. He reports that he does not drink alcohol. His drug history is not on file.  No Known Allergies  Medications:  I have reviewed the patient's current medications. Prior to Admission:  Prescriptions prior to admission  Medication Sig Dispense Refill Last Dose  . amantadine (SYMMETREL) 100 MG capsule Take 200 mg by mouth 2 (two) times daily.    09/06/2015 at Unknown time  . amLODipine (NORVASC) 10 MG tablet Take 10 mg by mouth daily.    09/06/2015 at Unknown time  . aspirin 325 MG tablet Take 1 tablet (325 mg total) by mouth daily. 30  tablet 0 09/06/2015 at Unknown time  . atorvastatin (LIPITOR) 80 MG tablet Take 1 tablet (80 mg total) by mouth daily at 6 PM. 30 tablet 0 09/06/2015 at Unknown time  . cloNIDine (CATAPRES) 0.1 MG tablet Take 0.1 mg by mouth 2 (two) times daily.   09/06/2015 at Unknown time  . clopidogrel (PLAVIX) 75 MG tablet 75 mg by Gastrostomy Tube route daily.    09/06/2015 at Unknown time  . feeding supplement, ENSURE ENLIVE, (ENSURE ENLIVE) LIQD Take 237 mLs by mouth 2 (two) times daily between meals. 60 Bottle 12 09/06/2015 at Unknown time  . Fenofibrate 40 MG TABS 1 tablet by Gastrostomy Tube route every evening.    09/06/2015 at Unknown time  . Fenofibrate 40 MG TABS Take 40 mg by mouth 2 (two) times daily.   09/06/2015 at Unknown time  . FLUoxetine (PROZAC) 20 MG capsule 20 mg by Gastrostomy Tube route daily.    09/06/2015 at Unknown time  . hydrALAZINE (APRESOLINE) 10 MG tablet Take 10 mg by mouth every 8 (eight) hours.    09/06/2015 at 6p  . hydrochlorothiazide (HYDRODIURIL) 25 MG tablet Take 25 mg by mouth daily.    09/06/2015 at Unknown time  . HYDROcodone-acetaminophen (NORCO/VICODIN) 5-325 MG per tablet Take one tablet by mouth every 6 hours as needed for pain. Not to exceed 3000mg  of APAP from all sources/24h 120 tablet 0 Past Month at Unknown time  .  lisinopril (PRINIVIL,ZESTRIL) 40 MG tablet Take 40 mg by mouth daily.   09/06/2015 at Unknown time  . Maltodextrin-Xanthan Gum (RESOURCE THICKENUP CLEAR) POWD Take 3,600 g by mouth as needed. 30 Can 0 09/06/2015 at Unknown time  . minoxidil (LONITEN) 2.5 MG tablet Take 5 mg by mouth 2 (two) times daily.    09/06/2015 at Unknown time  . Multiple Vitamins tablet Take 1 tablet by mouth daily.   09/06/2015 at Unknown time  . pantoprazole (PROTONIX) 40 MG tablet Take 1 tablet (40 mg total) by mouth 2 (two) times daily. 30 tablet 0 09/06/2015 at Unknown time  . sennosides-docusate sodium (SENOKOT-S) 8.6-50 MG tablet Take 1 tablet by mouth daily.     09/06/2015 at Unknown time   Scheduled: . aspirin  325 mg Oral Daily  . atorvastatin  80 mg Oral q1800  . [START ON 09/08/2015] Chlorhexidine Gluconate Cloth  6 each Topical Q0600  . clopidogrel  75 mg Oral Daily  . fenofibrate  54 mg Oral Daily  . mupirocin ointment  1 application Nasal BID  . pantoprazole  40 mg Oral BID  . senna-docusate  1 tablet Oral Daily  . sodium chloride  3 mL Intravenous Q12H    ROS: History obtained from the patient  General ROS: negative for - chills, fatigue, fever, night sweats, weight gain or weight loss Psychological ROS: negative for - behavioral disorder, hallucinations, memory difficulties, mood swings or suicidal ideation Ophthalmic ROS: negative for - blurry vision, double vision, eye pain or loss of vision ENT ROS: negative for - epistaxis, nasal discharge, oral lesions, sore throat, tinnitus or vertigo Allergy and Immunology ROS: negative for - hives or itchy/watery eyes Hematological and Lymphatic ROS: negative for - bleeding problems, bruising or swollen lymph nodes Endocrine ROS: negative for - galactorrhea, hair pattern changes, polydipsia/polyuria or temperature intolerance Respiratory ROS: negative for - cough, hemoptysis, shortness of breath or wheezing Cardiovascular ROS: negative for - chest pain, dyspnea on exertion, edema or irregular heartbeat Gastrointestinal ROS: negative for - abdominal pain, diarrhea, hematemesis, nausea/vomiting or stool incontinence Genito-Urinary ROS: negative for - dysuria, hematuria, incontinence or urinary frequency/urgency Musculoskeletal ROS: negative for - joint swelling or muscular weakness Neurological ROS: as noted in HPI Dermatological ROS: negative for rash and skin lesion changes  Physical Examination: Blood pressure 152/70, pulse 78, temperature 98.4 F (36.9 C), temperature source Oral, resp. rate 20, weight 104.327 kg (230 lb), SpO2 98 %.  HEENT-  Normocephalic, no lesions, without obvious  abnormality.  Normal external eye and conjunctiva.  Normal TM's bilaterally.  Normal auditory canals and external ears. Normal external nose, mucus membranes and septum.  Normal pharynx. Cardiovascular- S1, S2 normal, pulses palpable throughout   Lungs- chest clear, no wheezing, rales, normal symmetric air entry Abdomen- soft, non-tender; bowel sounds normal; no masses,  no organomegaly Extremities- no edema Lymph-no adenopathy palpable Musculoskeletal-no joint tenderness, deformity or swelling Skin-warm and dry, no hyperpigmentation, vitiligo, or suspicious lesions  Neurological Examination Mental Status: Alert, does not know where he is but is able to tell me where he lives and some recent information about his history.  Is disoriented to time  Speech fluent but dysarthric.  Able to follow 3 step commands without difficulty. Cranial Nerves: II: Discs flat bilaterally; Visual fields grossly normal, pupils equal, round, reactive to light and accommodation III,IV, VI: ptosis not present, extra-ocular motions intact bilaterally V,VII: mild left facial droop, facial light touch sensation decreased on the left VIII: hearing normal bilaterally IX,X: gag reflex  present XI: bilateral shoulder shrug XII: midline tongue extension Motor: Right : Upper extremity   5/5    Left:     Upper extremity   3/5  Lower extremity   5/5     Lower extremity   5-/5 Tone and bulk:normal tone throughout; no atrophy noted Sensory: Pinprick and light touch decreased on the RUE Deep Tendon Reflexes: 2+ and symmetric with absent AJ's bilaterally Plantars: Right: mute   Left: upgoing Cerebellar: Normal finger-to-nose testing bilaterally.  Heel-to-shin testing with dysmetria bilaterally Gait: not tested due to safety concerns  Laboratory Studies:   Basic Metabolic Panel:  Recent Labs Lab 09/07/15 0935 09/07/15 1642  NA 146*  --   K 4.0  --   CL 105  --   CO2 27  --   GLUCOSE 93  --   BUN 44*  --    CREATININE 4.47* 3.92*  CALCIUM 10.0  --     Liver Function Tests:  Recent Labs Lab 09/07/15 0935  AST 16  ALT 7*  ALKPHOS 133*  BILITOT 0.5  PROT 7.8  ALBUMIN 3.2*   No results for input(s): LIPASE, AMYLASE in the last 168 hours.  Recent Labs Lab 09/07/15 1531  AMMONIA 19    CBC:  Recent Labs Lab 09/07/15 0935  WBC 5.1  NEUTROABS 3.2  HGB 9.1*  HCT 29.2*  MCV 85.1  PLT 229    Cardiac Enzymes:  Recent Labs Lab 09/07/15 1531  TROPONINI <0.03    BNP: Invalid input(s): POCBNP  CBG:  Recent Labs Lab 09/07/15 0843 09/07/15 1638  GLUCAP 77 50    Microbiology: Results for orders placed or performed during the hospital encounter of 08/08/15  Urine culture     Status: None   Collection Time: 08/08/15  2:40 PM  Result Value Ref Range Status   Specimen Description URINE, CATHETERIZED  Final   Special Requests NONE  Final   Culture NO GROWTH 1 DAY  Final   Report Status 08/09/2015 FINAL  Final  MRSA PCR Screening     Status: Abnormal   Collection Time: 08/09/15  1:00 AM  Result Value Ref Range Status   MRSA by PCR POSITIVE (A) NEGATIVE Final    Comment:        The GeneXpert MRSA Assay (FDA approved for NASAL specimens only), is one component of a comprehensive MRSA colonization surveillance program. It is not intended to diagnose MRSA infection nor to guide or monitor treatment for MRSA infections. RESULT CALLED TO, READ BACK BY AND VERIFIED WITH: C MOSELEY @0438  08/09/15 MKELLY     Coagulation Studies:  Recent Labs  09/07/15 1531  LABPROT 15.5*  INR 1.22    Urinalysis:  Recent Labs Lab 09/07/15 1215  COLORURINE YELLOW  LABSPEC 1.017  PHURINE 5.5  GLUCOSEU NEGATIVE  HGBUR MODERATE*  BILIRUBINUR NEGATIVE  KETONESUR NEGATIVE  PROTEINUR NEGATIVE  UROBILINOGEN 1.0  NITRITE NEGATIVE  LEUKOCYTESUR TRACE*    Lipid Panel:  No results found for: CHOL, TRIG, HDL, CHOLHDL, VLDL, LDLCALC  HgbA1C: No results found for:  HGBA1C  Urine Drug Screen:  No results found for: LABOPIA, COCAINSCRNUR, LABBENZ, AMPHETMU, THCU, LABBARB  Alcohol Level: No results for input(s): ETH in the last 168 hours.  Other results: EKG: sinus rhythm at 81 bpm.  Imaging: Dg Chest 2 View  09/07/2015  CLINICAL DATA:  altered from norm. Pt normally wheels self to bathroom, has hx of CVA, also has dark urine per staff EXAM: CHEST - 2 VIEW COMPARISON:  08/08/2015 FINDINGS: Relatively low lung volumes with some subsegmental atelectasis at the right lung base, improved since previous. Heart size upper limits normal. Mildly tortuous thoracic aorta. No pneumothorax. No effusion. Mild spurring in the lower thoracic spine. IMPRESSION: 1. Improving right lower lobe atelectasis or infiltrate. No acute or superimposed abnormality. Electronically Signed   By: Lucrezia Europe M.D.   On: 09/07/2015 09:43   Ct Head Wo Contrast  09/07/2015  CLINICAL DATA:  ALTERED MENTAL STATUS, NURSING FACILITY GIVES HX OF 2 FALLS YESTERDAY, LESS ORIENTED TODAY, HX DIABETES, HTN, CVA WITH LEFT SIDE HEMIPARESIS, DYSPHASIA, RECENT HOSPITALIZATION FOR GI BLEED, PLAVIX RX EXAM: CT HEAD WITHOUT CONTRAST TECHNIQUE: Contiguous axial images were obtained from the base of the skull through the vertex without intravenous contrast. Continued patient motion required rescanning x2. COMPARISON:  08/08/2015 FINDINGS: Right MCA stent. Atherosclerotic and physiologic intracranial calcifications. Old right MCA distribution infarct with encephalomalacia. No acute intracranial hemorrhage, midline shift, new parenchymal edema, mass or mass effect. There is moderate atrophy with prominence of ventricles and sulci which remain relatively symmetric. Acute infarct may be inapparent on noncontrast CT. CT bone windows reveal no calvarial lesion. IMPRESSION: 1. Negative for bleed or other acute intracranial process. 2. Stable old right MCA distribution infarct without hemorrhagic change. Electronically Signed    By: Lucrezia Europe M.D.   On: 09/07/2015 09:47     Assessment/Plan: 54 year old male presenting with mental status changes.  Head CT personally reviewed and shows no acute changes.  Old infarct apparent.  Patient with baseline cognitive issues and now with superimposed metabolic issues due to a renal insufficiency (BUN/Cr increased from a baseline of 5/0.82 on 10/6 to 44/4.47 today).  Suspect presentation secondary to a metabolic encephalopathy.    Recommendations: 1.  Will continue to follow with you.  Would expect mental status to improve as metabolic issues improve although cognition may lag behind labs to some degree.    Alexis Goodell, MD [facility] [phone] 09/07/2015, 6:38 PM

## 2015-09-07 NOTE — ED Notes (Signed)
[facility] called, stated that pt has been with teaching service in the last 30 days, so he needs to go back with teaching service. Paged teaching service to Dr. Oleta Mouse, at [phone]. Called the secretary for this pod, Tracey and let her know of the same.

## 2015-09-07 NOTE — ED Notes (Signed)
To Ed via [facility] from [facility] on [address]--- with staff stating that pt is altered from norm. Pt normally wheels self to bathroom, has hx of CVA, also has dark urine per staff.

## 2015-09-07 NOTE — Progress Notes (Signed)
[facility] Admission History and Physical Service Pager: [phone]  Patient name: [name] [name] Medical record number: [id number] Date of birth: Aug 15, 1961 Age: 54 y.o. Gender: male  Primary Care Provider: Gildardo Cranker, DO Consultants: none Code Status: full  Chief Complaint: fall and dizziness  Assessment and Plan: [name] [name] is a 54 y.o. male presenting with altered mental status, dizziness (room spinning) and fall x2 last night. Patient is poor historian with decreased baseline mental status likely from CVA in July 2016. He has stent placed in [facility] and has been on plavix and aspirin. He is oriented to person, place and year, however he was unable to remember the day of the week or the month. PMH is significant for CVA in July 2016 s/p stent in [facility] with residual L hemiplegia (on ASA and Plavix), HTN, HLD and depression.  AMS/Dizziness/Fall: fall after rising from sitting to standing to walk to bathroom x2. Also notes dizzines when rising from supine to sitting or sitting to standing, suggestive for orthostatic hypotension. Patient appears dehydrated with dry mucus membranes on oral exam. However, vital signs within normal limit here. EKG read significant for QTc of 578, however appeared to be approximately 460 when calculated by hand. This is still concerning as he has been on clarithromycin for about two weeks for H. Pylori. Troponin negative. Hypoglycemia is another possibility. His CBG was 77 on presentation. He is also on amantadine which could cause drowsiness, dizziness, or blurred vision. It is not clear why he is on this medication. Ammonia level is 19. Slurred speech, left sided hemiplegia is concerning for CVA. He is on dual antiplatelet that increases his risk for hemorrhagic stroke but CT head negative. Ischemic stroke unlikely while on dual antiplatelet but can't rule out. He also has baseline hemiplegia and dysarthria. His photophobia is concerning  for meningitis. However, he doesn't look septic. No headache, objective fever or leukocytosis. UA significant for trace WBC's and moderate Hgb likely from catheterization in ED. No history of dysuria before presentation although he endorses one here after cath. - Admit to FPTS. Attending Dr. Wendy Poet - Orthostatic vitals - Monitor mental status, neuro checks q4hr - S/p 1L of NS in ED. D5-NS at 150 ml/hr for 12 hours and will give and additional 1L NS bolus - Continue PO ASA, plavix and Atorvastatin after SLP eval - Hold BP meds given soft blood pressures at presentation and resume as BP allows - Holding Amantadine. - Follow up blood and urine cultures. Note that per lab only one vial of blood was able to be obtained for culture. Consider reattempt when further hydrated. - Holding of antibiotics for now as he didn't look septic or infected.  - Monitor CBG - Will get another EKG.  - Avoid QT prolonging drugs. - Neurology consulted, appreciate recommendations - Sitter. Discussed with family that having member present may help with confusion--they will attempt to have family member in room when possible  AKI: sCr 4.47 on presentation. Baseline appears 0.8-0.9. He had one isolated elevated creatinine to 3.11 on 07/23/2015. FENA 1% suggestive for prerenal insult. BP 113/73. This might be low for him as his blood pressure is usually on the higher sides of normal. S/p a liter of NS in ED. Creatinine improved to 3.92 -D5NS @150  ml/hr and additional 1L bolus given - Holding Lisinopril.  - Continue to monitor  Hypernatremia: Na to 146. Urine sodium of 96. Likely from renal fluid loss secondary to diuretics. He is on HCTZ  25 mg  -D5NS as above.  Hx of CVA: Occurred in June 2016. Had a stent placed in the R MCA. Pt with residual L hemiplegia. Has been on ASA and Plavix. Deficits appreciated on physical exam.  - Continue ASA and plavix after SLP eval - Bedside swallow evaluation - CT in ED: no acute  process  HTN: BPs 113/73 on arrival. On multiple meds at home. Hydralazine, HCTZ, Lisinopril, minoxidil, clonidine.  -Hold lisinopril given AKI and hypernatremia. Holding other antihypertensives as well at this time and will resume as BP tolerates - Continue to monitor  HLD: -Continue home Lipitor qd, Fenofibrate qd  Depression: Pt experienced depression after stroke - Continue home med: Prozac 20mg  qd  FEN/GI: -IVF as above -NPO until SLP eval, then initiate on renal diet  PPX: SCDs given history of recent significant GI bleed  Disposition: Pending improvement in mental status  History of Present Illness:  [name] [name] is a 54 y.o. male presenting with altered mental status, dizziness (room spinning) and fall x2 last night. Patient is poor historian with decreased baseline mental status likely from CVA in July 2016 and acute change in mental status. He has stent placed in RMCA and has been on plavix and aspirin. He is oriented to person, place and year. Patient appears a little bit delirious as well with intention to get off bed and sit on physician stool.  Patient fell twice last night after rising from lying to standing to go to restroom. He denies hitting his head or loss of consciousness. He was able to get up by holding to the rails. He did not hit head. He also reports feeling dizzy rising from lying to sitting and sitting to standing. He reports fever last week "78 feet" and shortness of breath starting yesterday. He denies trouble breathing now. He says his weakness in left arm, which is a little different from baseline. He also states numbness of left arm but at baseline  He denies chest pain or pain else where. He reports some burning with urination after catheterization in ED, denies dysuria before coming in. He reports urinating a few hours ago. Asks if he can get stool softener for constipation. He reports getting all his medication at nursing home. Denies vision changes.  However, he states that lights bothers him. He is able to look at the TV and tell us what is going on. He has been eating and drinking well. Denies diarrhea, headache, shortness of breath or pain anywhere.  Off note, patient was hospitalized for GI bleed between 9/30 and 10/06 and received 5 untis of pRBC. His Hgb this time is 9.1 (8.5 on discharge).   Review Of Systems: Per HPI Otherwise the remainder of the systems were negative.  Patient Active Problem List   Diagnosis Date Noted  . Acute renal failure (ARF) ([place]) 09/07/2015  . Fall 09/07/2015  . Hypokalemia   . Gastrointestinal hemorrhage with melena   . Bleeding gastrointestinal   . H/O: stroke with residual effects   . GI bleed 08/08/2015  . Acute blood loss anemia 08/08/2015  . Acute CVA (cerebrovascular accident) ([place]) 06/02/2015  . Hemiparesis affecting left side as late effect of stroke ([place]) 06/02/2015  . Dyslipidemia 06/02/2015  . Type II diabetes mellitus with neurological manifestations ([place]) 06/02/2015  . Essential hypertension, malignant 06/02/2015  . Chronic constipation 06/02/2015  . Depression due to stroke ([place]) 06/02/2015  . Dysphagia as late effect of stroke 06/02/2015    Past Medical History:  Past Medical History  Diagnosis Date  . Stroke ([place])   . Hypertension   . Hyperlipidemia   . Diabetes mellitus without complication ([place])   . Fall at home   . Hemiparesis affecting left side as late effect of stroke ([place])   . Dysphagia as late effect of cerebrovascular disease   . Acute respiratory failure ([place])   . Cerebral edema [facility])     Past Surgical History: Past Surgical History  Procedure Laterality Date  . Gastrostomy tube placement  05-19-15  . Mechanical thrombectomy angioplasty stenet placement    . Esophagogastroduodenoscopy N/A 08/12/2015    Procedure: ESOPHAGOGASTRODUODENOSCOPY (EGD);  Surgeon: Ladene Artist, MD;  Location: [facility] ENDOSCOPY;  Service: Endoscopy;  Laterality: N/A;    Social  History: Social History  Substance Use Topics  . Smoking status: Former Smoker    Types: Cigarettes  . Smokeless tobacco: None  . Alcohol Use: No   Additional social history: Please also refer to relevant sections of EMR.  Family History: Family History  Problem Relation Age of Onset  . Family history unknown: Yes    Allergies and Medications: No Known Allergies No current facility-administered medications on file prior to encounter.   Current Outpatient Prescriptions on File Prior to Encounter  Medication Sig Dispense Refill  . amantadine (SYMMETREL) 100 MG capsule Take 200 mg by mouth 2 (two) times daily.     Marland Kitchen amLODipine (NORVASC) 10 MG tablet Take 10 mg by mouth daily.     Marland Kitchen aspirin 325 MG tablet Take 1 tablet (325 mg total) by mouth daily. 30 tablet 0  . atorvastatin (LIPITOR) 80 MG tablet Take 1 tablet (80 mg total) by mouth daily at 6 PM. 30 tablet 0  . clopidogrel (PLAVIX) 75 MG tablet 75 mg by Gastrostomy Tube route daily.     . feeding supplement, ENSURE ENLIVE, (ENSURE ENLIVE) LIQD Take 237 mLs by mouth 2 (two) times daily between meals. 60 Bottle 12  . Fenofibrate 40 MG TABS 1 tablet by Gastrostomy Tube route every evening.     Marland Kitchen FLUoxetine (PROZAC) 20 MG capsule 20 mg by Gastrostomy Tube route daily.     . hydrALAZINE (APRESOLINE) 10 MG tablet Take 10 mg by mouth every 8 (eight) hours.     . hydrochlorothiazide (HYDRODIURIL) 25 MG tablet Take 25 mg by mouth daily.     Marland Kitchen HYDROcodone-acetaminophen (NORCO/VICODIN) 5-325 MG per tablet Take one tablet by mouth every 6 hours as needed for pain. Not to exceed 3000mg  of APAP from all sources/24h 120 tablet 0  . lisinopril (PRINIVIL,ZESTRIL) 40 MG tablet Take 40 mg by mouth daily.    . Maltodextrin-Xanthan Gum (RESOURCE THICKENUP CLEAR) POWD Take 3,600 g by mouth as needed. 30 Can 0  . minoxidil (LONITEN) 2.5 MG tablet Take 5 mg by mouth 2 (two) times daily.     . Multiple Vitamins tablet Take 1 tablet by mouth daily.    .  pantoprazole (PROTONIX) 40 MG tablet Take 1 tablet (40 mg total) by mouth 2 (two) times daily. 30 tablet 0  . sennosides-docusate sodium (SENOKOT-S) 8.6-50 MG tablet Take 1 tablet by mouth daily.       Objective: BP 114/72 mmHg  Pulse 75  Temp(Src) 98.3 F (36.8 C) (Oral)  Resp 20  Wt 230 lb (104.327 kg)  SpO2 95% Exam: Gen: appears confused at times but answers questions and follows commands. Looks restless.  Eyes: conjunctivae appears redish with some yellowish discharge. Nares: clear, no erythema, swelling or  congestion Oropharynx: clear, dry mucus membrane CV: RRR. S1 & S2 audible, no murmurs. Resp: no apparent WOB, CTAB. Abd: +BS. Soft, NDNT, no rebound or guarding.  Ext: No edema or gross deformities. Neuro:MS - alert, interactive. Appears a little confused at a times. Oriented to person, place, and year but not date or month. Dysarthria (slurred speech) Attention is waning and waxing.  Cranial Nerves - couldn't evaluate Pupils and EOMI due to photophobia and lack of cooperation; sensation intact, face symmetric with full strength of facial muscles,  tongue protrusion is symmetric with full movement to both side. Sternocleidomastoid and trapezius are with normal strength.  Tone - Normal.  Strength - 4/5 on left UE and LE, 5/5 in right Plantar responses flexor bilaterally Coordination : No dysmetria on finger to nose. Shaking (tremor) on left side  Labs and Imaging: CBC BMET   Recent Labs Lab 09/07/15 0935  WBC 5.1  HGB 9.1*  HCT 29.2*  PLT 229    Recent Labs Lab 09/07/15 0935  NA 146*  K 4.0  CL 105  CO2 27  BUN 44*  CREATININE 4.47*  GLUCOSE 93  CALCIUM 10.0    Dg Chest 2 View  09/07/2015  CLINICAL DATA:  altered from norm. Pt normally wheels self to bathroom, has hx of CVA, also has dark urine per staff EXAM: CHEST - 2 VIEW COMPARISON:  08/08/2015 FINDINGS: Relatively low lung volumes with some subsegmental atelectasis at the right lung base, improved  since previous. Heart size upper limits normal. Mildly tortuous thoracic aorta. No pneumothorax. No effusion. Mild spurring in the lower thoracic spine. IMPRESSION: 1. Improving right lower lobe atelectasis or infiltrate. No acute or superimposed abnormality. Electronically Signed   By: Lucrezia Europe M.D.   On: 09/07/2015 09:43   Ct Head Wo Contrast  09/07/2015  CLINICAL DATA:  ALTERED MENTAL STATUS, NURSING FACILITY GIVES HX OF 2 FALLS YESTERDAY, LESS ORIENTED TODAY, HX DIABETES, HTN, CVA WITH LEFT SIDE HEMIPARESIS, DYSPHASIA, RECENT HOSPITALIZATION FOR GI BLEED, PLAVIX RX EXAM: CT HEAD WITHOUT CONTRAST TECHNIQUE: Contiguous axial images were obtained from the base of the skull through the vertex without intravenous contrast. Continued patient motion required rescanning x2. COMPARISON:  08/08/2015 FINDINGS: Right MCA stent. Atherosclerotic and physiologic intracranial calcifications. Old right MCA distribution infarct with encephalomalacia. No acute intracranial hemorrhage, midline shift, new parenchymal edema, mass or mass effect. There is moderate atrophy with prominence of ventricles and sulci which remain relatively symmetric. Acute infarct may be inapparent on noncontrast CT. CT bone windows reveal no calvarial lesion. IMPRESSION: 1. Negative for bleed or other acute intracranial process. 2. Stable old right MCA distribution infarct without hemorrhagic change. Electronically Signed   By: Lucrezia Europe M.D.   On: 09/07/2015 09:47    Mercy Riding, MD 09/07/2015, 2:09 PM PGY-1, [place] Intern pager: [phone], text pages welcome  Upper Level Addendum:  I have seen and evaluated this patient along with Dr. Cyndia Skeeters and reviewed the above note, making necessary revisions in blue.   Dr. Junie Panning, DO, PGY2 09/07/2015; 5:33 PM

## 2015-09-07 NOTE — ED Notes (Signed)
Pt continues to appear agitated.  Viviana Simpler MD made aware

## 2015-09-07 NOTE — ED Notes (Signed)
CBG- 77 

## 2015-09-07 NOTE — Progress Notes (Signed)
Admission note:  Arrival Method: ED stretcher Mental Orientation: alert & oriented to person only; pt very agitated MD notified  Telemetry: box #4 applied and CCMD notified  Assessment: completed Skin: intact; multiple callus covering bottom of bilateral feet  IV: right forearm Pain: pt denies  Tubes: N/A  Safety Measures: bed alarm activated d/t severe agitation  Admission: Completed and admission orders have been written  6E Orientation: Patient has been oriented to the unit, staff and to the room.     Gerry Heaphy [employer], RN [employer] Phone [phone]

## 2015-09-07 NOTE — ED Notes (Signed)
Pt keeps trying to climb out of bed.  Environment adjusted and pt reoriented.

## 2015-09-08 DIAGNOSIS — R410 Disorientation, unspecified: Secondary | ICD-10-CM | POA: Insufficient documentation

## 2015-09-08 DIAGNOSIS — R4189 Other symptoms and signs involving cognitive functions and awareness: Secondary | ICD-10-CM | POA: Insufficient documentation

## 2015-09-08 DIAGNOSIS — N179 Acute kidney failure, unspecified: Principal | ICD-10-CM

## 2015-09-08 DIAGNOSIS — W19XXXD Unspecified fall, subsequent encounter: Secondary | ICD-10-CM

## 2015-09-08 DIAGNOSIS — E860 Dehydration: Secondary | ICD-10-CM | POA: Insufficient documentation

## 2015-09-08 LAB — URINE CULTURE: Culture: NO GROWTH

## 2015-09-08 LAB — BASIC METABOLIC PANEL
ANION GAP: 10 (ref 5–15)
BUN: 32 mg/dL — ABNORMAL HIGH (ref 6–20)
CALCIUM: 9.6 mg/dL (ref 8.9–10.3)
CHLORIDE: 113 mmol/L — AB (ref 101–111)
CO2: 24 mmol/L (ref 22–32)
Creatinine, Ser: 2.38 mg/dL — ABNORMAL HIGH (ref 0.61–1.24)
GFR calc Af Amer: 33 mL/min — ABNORMAL LOW (ref >60)
GFR calc non Af Amer: 29 mL/min — ABNORMAL LOW (ref >60)
GLUCOSE: 105 mg/dL — AB (ref 65–99)
POTASSIUM: 3.6 mmol/L (ref 3.5–5.1)
Sodium: 147 mmol/L — ABNORMAL HIGH (ref 135–145)

## 2015-09-08 LAB — CBC
HEMATOCRIT: 27.9 % — AB (ref 39.0–52.0)
HEMOGLOBIN: 8.7 g/dL — AB (ref 13.0–17.0)
MCH: 26.5 pg (ref 26.0–34.0)
MCHC: 31.2 g/dL (ref 30.0–36.0)
MCV: 85.1 fL (ref 78.0–100.0)
Platelets: 202 10*3/uL (ref 150–400)
RBC: 3.28 MIL/uL — ABNORMAL LOW (ref 4.22–5.81)
RDW: 15.4 % (ref 11.5–15.5)
WBC: 5.8 10*3/uL (ref 4.0–10.5)

## 2015-09-08 LAB — COMPREHENSIVE METABOLIC PANEL
ALK PHOS: 130 U/L — AB (ref 38–126)
ALT: 8 U/L — ABNORMAL LOW (ref 17–63)
ANION GAP: 15 (ref 5–15)
AST: 22 U/L (ref 15–41)
Albumin: 3 g/dL — ABNORMAL LOW (ref 3.5–5.0)
BILIRUBIN TOTAL: 0.6 mg/dL (ref 0.3–1.2)
BUN: 28 mg/dL — ABNORMAL HIGH (ref 6–20)
CALCIUM: 9.5 mg/dL (ref 8.9–10.3)
CO2: 24 mmol/L (ref 22–32)
CREATININE: 2.24 mg/dL — AB (ref 0.61–1.24)
Chloride: 106 mmol/L (ref 101–111)
GFR, EST AFRICAN AMERICAN: 36 mL/min — AB (ref >60)
GFR, EST NON AFRICAN AMERICAN: 31 mL/min — AB (ref >60)
Glucose, Bld: 94 mg/dL (ref 65–99)
Potassium: 3.4 mmol/L — ABNORMAL LOW (ref 3.5–5.1)
SODIUM: 145 mmol/L (ref 135–145)
TOTAL PROTEIN: 7 g/dL (ref 6.5–8.1)

## 2015-09-08 LAB — SEDIMENTATION RATE: Sed Rate: 77 mm/hr — ABNORMAL HIGH (ref 0–16)

## 2015-09-08 LAB — TSH: TSH: 1.18 u[IU]/mL (ref 0.350–4.500)

## 2015-09-08 LAB — GLUCOSE, CAPILLARY
GLUCOSE-CAPILLARY: 108 mg/dL — AB (ref 65–99)
GLUCOSE-CAPILLARY: 96 mg/dL (ref 65–99)
Glucose-Capillary: 82 mg/dL (ref 65–99)
Glucose-Capillary: 127 mg/dL — ABNORMAL HIGH (ref 65–99)

## 2015-09-08 LAB — VITAMIN B12: Vitamin B-12: 973 pg/mL — ABNORMAL HIGH (ref 180–914)

## 2015-09-08 LAB — FOLATE: FOLATE: 10.5 ng/mL (ref >5.9)

## 2015-09-08 MED ORDER — DEXTROSE 5 % IV SOLN
INTRAVENOUS | Status: DC
  Administered 2015-09-08 – 2015-09-09 (×2): via INTRAVENOUS

## 2015-09-08 NOTE — Progress Notes (Signed)
Family Medicine Teaching Service Daily Progress Note Intern Pager: [phone]  Patient name: [name] [name] Medical record number: [id number] Date of birth: Sep 27, 1961 Age: 54 y.o. Gender: male  Primary Care Provider: Gildardo Cranker, DO Consultants: neurology Code Status: full  Pt Overview and Major Events to Date:  10/30-admitted with AMS, Fall and AKI  Assessment and Plan: [name] [name] is a 54 y.o. male presenting with altered mental status, dizziness (room spinning) and fall x2 last night. Patient is poor historian with decreased baseline mental status likely from CVA in July 2016. He has stent placed in RMCA and has been on plavix and aspirin. He is oriented to person, place and year, however he was unable to remember the day of the week or the month. PMH is significant for CVA in July 2016 s/p stent in [facility] with residual L hemiplegia (on ASA and Plavix), HTN, HLD and depression.  AMS/Dizziness/Fall: likely orthostatic hypotension. Patient appeared dehydrated on presentation. Vital signs within normal range but the blood pressure could low for him as his BP's are usually on the higher side. EKG read significant for QTc of 578, however appeared to be approximately 460 when calculated by hand. This is still concerning as he has been on clarithromycin for about two weeks for H. Pylori before presentation. Troponin negative. CBG 77 on presentation which could be low for him. He is also on amantadine which could cause some of his symptoms. It is not clear why he is on this medication.  Ammonia level is 19. Slurred speech, left sided hemiplegia is concerning for CVA. He is on dual antiplatelet that increases his risk for hemorrhagic stroke but CT head negative. Ischemic stroke unlikely while on dual antiplatelet but can't rule out. He also has baseline hemiplegia and dysarthria. His photophobia is concerning for meningitis. However, he doesn't look septic. No headache, objective fever or leukocytosis. UA  significant for trace WBC's and moderate Hgb likely from catheterization in ED. No history of dysuria before presentation although he endorses one here after cath. He appears confused, restless and agitated this morning. He is oriented to self and month only. - Admit to FPTS. Attending Dr. Wendy Poet - Monitor mental status, neuro checks q4hr - S/p 2L of NS in ED. Has been on D5-NS at 150 ml/hr for 12 hours.  - Continue PO ASA, plavix and Atorvastatin. Already passed SLP eval - Hold BP meds given soft blood pressures at presentation and resume as BP allows - Holding Amantadine. Can cause orthostatic hypotension, abnormal dreams, agitation, anxiety, ataxia, confusion, delirium, depression, dizziness, drowsiness, fatigue, hallucination, headache, insomnia, irritability, nervousness. - Blood and urine cultures NG <24hrs. - Hold off antibiotics for now as he didn't look septic or infected.  - Monitor CBG-70-90's. Sitter says not eating his meals - Avoid QT prolonging drugs. - Neurology consulted, appreciate recommendations - Sitter. Discussed with family that having member present may help with confusion--they will attempt to have family member in room when possible  AKI: sCr 4.47 on presentation. Down to 2.38 this AM. Baseline appears 0.8-0.9. He had one isolated elevated creatinine to 3.11 on 07/23/2015. FENA 1% suggestive for prerenal insult. BP 113/73 on presentation 130/60 this AM. S/p 3 liters of NS in ED..  - Has been on D5NS @150  ml/hr.  - Free water at rate of 140 ml/hr - Holding home BP meds for now.  Hypernatremia: Na to 146 on presentation, 147 now. Urine sodium of 96. Likely from renal fluid loss secondary to diuretics. He is  on HCTZ 25 mg. Free water deficit of 3L. -Will give free water at 140 ml/hr  Hx of CVA: Occurred in June 2016. Had a stent placed in the R MCA. Pt with residual L hemiplegia. Has been on ASA and Plavix. Deficits appreciated on physical exam.  - Continue ASA and  plavix after SLP eval - Bedside swallow evaluation - CT in ED: no acute process  HTN: BPs 113/73 on arrival. 130/60 this morning. On multiple meds at home. Hydralazine, HCTZ, Lisinopril, minoxidil, clonidine.  -Holding antihypertensives as well at this time and will resume as BP tolerates -Will look in to his BP meds before discharge -Continue to monitor  HLD: -Continue home Lipitor qd, Fenofibrate qd  Depression: Pt experienced depression after stroke - Continue home med: Prozac 20mg  daily.   FEN/GI: -IVF as above -Passed SLP eval. Renal diet  PPX: SCDs given history of recent significant GI bleed. He is on dual antiplatelet as well.  Disposition: Pending improvement in mental status.  Came from ALF ([place]). SNF could be a better option.   Subjective:  Denies pain or trouble breathing. He is confused, restless and agitated. Oriented to self and month only. He is sitting on chair with sitter at bedside.   Objective: Temp:  [97.8 F (36.6 C)-98.5 F (36.9 C)] 98.2 F (36.8 C) (10/31 0511) Pulse Rate:  [70-97] 77 (10/31 0511) Resp:  [14-24] 18 (10/31 0511) BP: (113-162)/(42-94) 130/60 mmHg (10/31 0511) SpO2:  [94 %-100 %] 99 % (10/31 0511) Weight:  [230 lb (104.327 kg)] 230 lb (104.327 kg) (10/30 0809) Physical Exam: Gen: appears confused, restless and agitated  Eyes: conjunctivae appears better today. It was reddish. He is able to open his eyes today. Nares: clear, no erythema, swelling or congestion Oropharynx: clear, moist CV: RRR. S1 & S2 audible, no murmurs. Resp: no apparent WOB, CTAB. Abd: +BS. Soft, NDNT, no rebound or guarding.  Ext: No edema or gross deformities. Neuro:MS - alert, interactive but confused, restless and agitated . Oriented to self and month only. Some dysarthria (slurred speech). Cranial Nerves - couldn't evaluate Pupils due to photophobia and lack of cooperation. EOMI intact. Sensation intact, face symmetric with  full strength of facial muscles,tongue protrusion is symmetric with full movement to both side. Sternocleidomastoid and trapezius are with normal strength.  Tone - Normal.  Strength - 4/5 on left UE and LE, 5/5 in right.  Coordination : Shaking (course tremor) on left side Laboratory:  Recent Labs Lab 09/07/15 0935  WBC 5.1  HGB 9.1*  HCT 29.2*  PLT 229    Recent Labs Lab 09/07/15 0935 09/07/15 1642  NA 146*  --   K 4.0  --   CL 105  --   CO2 27  --   BUN 44*  --   CREATININE 4.47* 3.92*  CALCIUM 10.0  --   PROT 7.8  --   BILITOT 0.5  --   ALKPHOS 133*  --   ALT 7*  --   AST 16  --   GLUCOSE 93  --     Imaging/Diagnostic Tests: Dg Chest 2 View  09/07/2015  CLINICAL DATA:  altered from norm. Pt normally wheels self to bathroom, has hx of CVA, also has dark urine per staff EXAM: CHEST - 2 VIEW COMPARISON:  08/08/2015 FINDINGS: Relatively low lung volumes with some subsegmental atelectasis at the right lung base, improved since previous. Heart size upper limits normal. Mildly tortuous thoracic aorta. No pneumothorax. No effusion.  Mild spurring in the lower thoracic spine. IMPRESSION: 1. Improving right lower lobe atelectasis or infiltrate. No acute or superimposed abnormality. Electronically Signed   By: Lucrezia Europe M.D.   On: 09/07/2015 09:43   Ct Head Wo Contrast  09/07/2015  CLINICAL DATA:  ALTERED MENTAL STATUS, NURSING FACILITY GIVES HX OF 2 FALLS YESTERDAY, LESS ORIENTED TODAY, HX DIABETES, HTN, CVA WITH LEFT SIDE HEMIPARESIS, DYSPHASIA, RECENT HOSPITALIZATION FOR GI BLEED, PLAVIX RX EXAM: CT HEAD WITHOUT CONTRAST TECHNIQUE: Contiguous axial images were obtained from the base of the skull through the vertex without intravenous contrast. Continued patient motion required rescanning x2. COMPARISON:  08/08/2015 FINDINGS: Right MCA stent. Atherosclerotic and physiologic intracranial calcifications. Old right MCA distribution infarct with encephalomalacia. No acute  intracranial hemorrhage, midline shift, new parenchymal edema, mass or mass effect. There is moderate atrophy with prominence of ventricles and sulci which remain relatively symmetric. Acute infarct may be inapparent on noncontrast CT. CT bone windows reveal no calvarial lesion. IMPRESSION: 1. Negative for bleed or other acute intracranial process. 2. Stable old right MCA distribution infarct without hemorrhagic change. Electronically Signed   By: Lucrezia Europe M.D.   On: 09/07/2015 09:47    Mercy Riding, MD 09/08/2015, 7:11 AM PGY-1, [place] Intern pager: [phone], text pages welcome

## 2015-09-08 NOTE — Progress Notes (Signed)
Subjective: patient remains confused. No complaints. Is unaware of date or year but knows he is in hospital.   Exam: Filed Vitals:   09/08/15 0511  BP: 130/60  Pulse: 77  Temp: 98.2 F (36.8 C)  Resp: 18    HEENT-  Normocephalic, no lesions, without obvious abnormality.  Normal external eye and conjunctiva.  Normal TM's bilaterally.  Normal auditory canals and external ears. Normal external nose, mucus membranes and septum.  Normal pharynx. Cardiovascular- S1, S2 normal, pulses palpable throughout   Lungs- chest clear, no wheezing, rales, normal symmetric air entry Abdomen- normal findings: bowel sounds normal Extremities- no edema Lymph-no adenopathy palpable Musculoskeletal-no joint tenderness, deformity or swelling Skin-warm and dry, no hyperpigmentation, vitiligo, or suspicious lesions    Gen: In bed, NAD MS: Alert but not oriented, follows commands. Speech dysarthtic CN: PERRLA, vision intact, Left facial droop with decreased sensation on left side Motor: 5/5 on the right, 4/5 on the left UE and 5/5 on left LE Sensory: PP/LT decreased on the left UE DTR: 2+ symmetric with no AJ  Pertinent Labs: Na+ 147 Cr 2.38 improved from 4.47   Etta Quill PA-C [facility] [phone]  Impression: 54 YO male with confusion likely related to declined renal function.    Recommendations: 1) continue to treat renal issues.    09/08/2015, 11:09 AM

## 2015-09-08 NOTE — Progress Notes (Signed)
Utilization review completed. Orabelle Rylee, RN, BSN. 

## 2015-09-08 NOTE — Discharge Summary (Signed)
[facility] Discharge Summary  Patient name: [name] [name] Medical record number: [id number] Date of birth: October 27, 1961 Age: 54 y.o. Gender: male Date of Admission: 09/07/2015  Date of Discharge: 09/12/15 Admitting Physician: Blane Ohara McDiarmid, MD  Primary Care Provider: Gildardo Cranker, DO Consultants: Neurology  Indication for Hospitalization: Dizziness, confusion, and acute kidney injury  Discharge Diagnoses/Problem List:  Acute renal failure Cognitive impairment  Dementia with behavioral disturbance Left-sided hemiparesis after CVA Hypertension Depression Hypokalemia  Disposition: Back to [facility].  Discharge Condition: Stable, improved  Discharge Exam:  Gen: Laying in bed, alert, well-appearing, in NAD.  HEENT: [name] [name], EOMI, MMM CV: RRR, no murmurs. Resp: Normal WOB, lungs CTAB. Abd: +BS. Soft, NDNT, no rebound or guarding.  Ext: No edema or gross deformities. Neuro: Awake, alert. Oriented to person, place, and time; able to follow commands.  Brief Hospital Course:  [name] [name] is a 54 y.o. male with PMH significant for left sided hemiplegia and arthralgia from CVA s/p stent in [facility] in July 2016, HTN, HLD and depression who presented with altered mental status, dizziness and fall times two at assisted living facility.   Patient was dehydrated on presentation. Oropharynx dry on exam. He had AKI with sCr elevated to 4.47 form baseline of 0.9. He was also hypernatremic to 146. His EKG read was significant for QTc to 578. He was on Clarithromycin and Prozac, both of which can cause QTc prolongation. His troponin were negative. He was on amantadine as well, which can cause orthostatic hypotension, confusion, and dizziness. Ammonia was normal. CBG is low normal. He had dysarthria and left sided hemiplegia worse than his baseline. However, he was on dual antiplatelet therapy, so he was at increased risk for hemorrhagic stroke. However, his CT head  was negative. Blood and urine culture obtained before antibiotics were negative. Hospital course is described by problem below.  AMS/Fall: Patient appeared delirious on presentation. Possible causes of his fall and AMS are orthostatic hypotension/dehydration, arrhythmia, hypoglycemia and medication adverse effects. Because Amantadine can cause confusion and dizziness, we discontinued this medication. Neurology was consulted and felt his altered mental status likely metabolic encephalopathy. HIV, RPR, Vit B12, Folate, ammonia, and TSH were all normal. On 11/2, he got up out of bed in the middle of night and had an unwitnessed fall. His bed alarm was not on. Review of the videotape recording in his room showed that he fell backwards, caught himself on the side of the bed, and landed gently on his bottom. No further work-up was pursued. Near the end of his hospitalization, his sister came to visit and stated that she felt like [name] [name] was back to his mental status baseline.   Dehydation/AKI: Cr was 4.47 on presentation. Patient received 3Ls of NS bolus and maintenance D5-NS at 150 ml/hr for 12 hours. His Cr continued to trend down throughout his hospitalization and was 1.08 on the day before discharge.   Hypernatremia: Na to 146 on presentation, 147 the next morning. Replenished his free water deficit, with D5 at the rate of 12ml/hr over 21 hours. His sodium trended down to 140 on the day before discharge.  Hx of CVA: Occurred in June 2016. Had a stent placed in the R MCA. Pt with residual L hemiplegia. Has been on ASA and Plavix. Deficits appreciated on physical exam. Patient received his home ASA, plavix and Atorvastatin after he passed his SLP.  HTN: BPs 113/73 on arrival. All of his BP medications were held: Norvasc, Hydralazine,  HCTZ, Lisinopril, Minoxidil, Clonidine. After he was rehydrated, his BP steadily increased and we slowly added back Norvasc and Hydralazine. On the day of discharge, his Cr had  returned back to baseline, so we added back Lisinopril 20mg  at half his home dose (home dose is 40mg ). He should continue to have his blood pressures checked as an outpatient, and HCTZ should be restarted as deemed appropriate.   Hypokalemia: [name] [name] was noted to have a K of 2.9. We repleted him with K-Dur 78mEq x 2. We recommend that he have his K rechecked as an outpatient.  Issues for Follow Up:  1. We started Pt on Aricept 5mg  to help with his mental cognition. This should be increased to 10mg  after 1 month.  2. We stopped his Amantadine because it can cause confusion and dizziness. On chart review, we could not find a clear reason as to why he was prescribed this medication.  3. We stopped all of his BP medications on admission for low blood pressures. We added back Hydralazine and Norvasc initially in the setting of AKI. We then added back Lisinopril 20mg  on the day of discharge (half his home dose of 40mg ). His HCTZ and Minoxidil should be restarted as an outpatient as deemed appropriate.  4. [name] [name] had QT prolongation to 578 on admission. He was taking Clarithromycin for H. Pylori. We stopped this medication and did not restart it on discharge. 5. Pt was noted to have hypokalemia. He was given K-dur 80mEq x 2. Recommend repeat BMET as outpatient.  Significant Procedures: None  Significant Labs and Imaging:   Recent Labs Lab 09/08/15 0710 09/09/15 0805 09/10/15 1025  WBC 5.8 6.0 5.9  HGB 8.7* 9.2* 9.1*  HCT 27.9* 28.7* 28.8*  PLT 202 181 192    Recent Labs Lab 09/07/15 0935  09/08/15 0710 09/08/15 1230 09/09/15 0805 09/10/15 1025 09/11/15 0840  NA 146*  --  147* 145 140 140 140  K 4.0  --  3.6 3.4* 3.2* 3.0* 2.9*  CL 105  --  113* 106 105 104 102  CO2 27  --  24 24 24 26 26   GLUCOSE 93  --  105* 94 87 95 101*  BUN 44*  --  32* 28* 18 14 9   CREATININE 4.47*  < > 2.38* 2.24* 1.50* 1.44* 1.08  CALCIUM 10.0  --  9.6 9.5 9.4 9.1 9.1  ALKPHOS 133*  --   --  130*  --   --    --   AST 16  --   --  22  --   --   --   ALT 7*  --   --  8*  --   --   --   ALBUMIN 3.2*  --   --  3.0*  --   --   --   < > = values in this interval not displayed. Troponin: <0.03 Folate: 10.5 Vit B12: 973 TSH: 1.180 RPR: Non-reactive HIV: Negative Ammonia: 19 Sed rate: 77 UA: Cloudy, moderate Hgb, trace leukocytes, negative nitrites, 0-2 WBC Urine culture: no growth  Results/Tests Pending at Time of Discharge: None  Discharge Medications:    Medication List    STOP taking these medications        amantadine 100 MG capsule  Commonly known as:  SYMMETREL     cloNIDine 0.1 MG tablet  Commonly known as:  CATAPRES     hydrochlorothiazide 25 MG tablet  Commonly known as:  HYDRODIURIL  minoxidil 2.5 MG tablet  Commonly known as:  LONITEN      TAKE these medications        amLODipine 10 MG tablet  Commonly known as:  NORVASC  Take 10 mg by mouth daily.     aspirin 325 MG tablet  Take 1 tablet (325 mg total) by mouth daily.     atorvastatin 80 MG tablet  Commonly known as:  LIPITOR  Take 1 tablet (80 mg total) by mouth daily at 6 PM.     clopidogrel 75 MG tablet  Commonly known as:  PLAVIX  75 mg by Gastrostomy Tube route daily.     donepezil 5 MG tablet  Commonly known as:  ARICEPT  Take 1 tablet (5 mg total) by mouth at bedtime.     feeding supplement (ENSURE ENLIVE) Liqd  Take 237 mLs by mouth 2 (two) times daily between meals.     Fenofibrate 40 MG Tabs  Take 40 mg by mouth 2 (two) times daily.     FLUoxetine 20 MG capsule  Commonly known as:  PROZAC  20 mg by Gastrostomy Tube route daily.     hydrALAZINE 10 MG tablet  Commonly known as:  APRESOLINE  Take 10 mg by mouth every 8 (eight) hours.     HYDROcodone-acetaminophen 5-325 MG tablet  Commonly known as:  NORCO/VICODIN  Take one tablet by mouth every 6 hours as needed for pain. Not to exceed 3000mg  of APAP from all sources/24h     lisinopril 40 MG tablet  Commonly known as:   PRINIVIL,ZESTRIL  Take 0.5 tablets (20 mg total) by mouth daily. Until seen by PCP.     Multiple Vitamins tablet  Take 1 tablet by mouth daily.     pantoprazole 40 MG tablet  Commonly known as:  PROTONIX  Take 1 tablet (40 mg total) by mouth 2 (two) times daily.     RESOURCE THICKENUP CLEAR Powd  Take 3,600 g by mouth as needed.     sennosides-docusate sodium 8.6-50 MG tablet  Commonly known as:  SENOKOT-S  Take 1 tablet by mouth daily.        Discharge Instructions: Please refer to Patient Instructions section of EMR for full details.  Patient was counseled important signs and symptoms that should prompt return to medical care, changes in medications, dietary instructions, activity restrictions, and follow up appointments.   Follow-Up Appointments: Follow-up Information    Follow up with [facility].   Specialty:  Emergency Medicine   Why:  If symptoms worsen   Contact information:   [address] [id number] [place]      [facility], [place], [place].   Specialty:  Internal Medicine   Why:  for hospital follow-up appointment   Contact information:   [place] [address] [phone]       Sela Hua, MD 09/12/2015, 2:54 PM PGY-1, [facility]

## 2015-09-08 NOTE — NC FL2 (Signed)
[place] LEVEL OF CARE SCREENING TOOL     IDENTIFICATION  Patient Name: [name] [name] Birthdate: Oct 11, 1961 Sex: male Admission Date (Current Location): 09/07/2015  [facility] and [place] Number: [employer] and Address:  [facility], [place] [address], [place], [place] [address]      Provider Number: [id number]  Attending Physician Name and Address:  Blane Ohara McDiarmid, MD  Relative Name and Phone Number:  Janann Colonel [phone]    Current Level of Care: Hospital Recommended Level of Care: [place] Prior Approval Number:    Date Approved/Denied:   PASRR Number:    Discharge Plan: SNF    Current Diagnoses: Patient Active Problem List   Diagnosis Date Noted  . Acute renal failure (ARF) ([place]) 09/07/2015  . Fall 09/07/2015  . Hypokalemia   . Gastrointestinal hemorrhage with melena   . Bleeding gastrointestinal   . H/O: stroke with residual effects   . GI bleed 08/08/2015  . Acute blood loss anemia 08/08/2015  . Acute CVA (cerebrovascular accident) ([place]) 06/02/2015  . Hemiparesis affecting left side as late effect of stroke ([place]) 06/02/2015  . Dyslipidemia 06/02/2015  . Type II diabetes mellitus with neurological manifestations ([place]) 06/02/2015  . Essential hypertension, malignant 06/02/2015  . Chronic constipation 06/02/2015  . Depression due to stroke ([place]) 06/02/2015  . Dysphagia as late effect of stroke 06/02/2015    Orientation ACTIVITIES/SOCIAL BLADDER RESPIRATION    Self  Active External catheter Normal  BEHAVIORAL SYMPTOMS/MOOD NEUROLOGICAL BOWEL NUTRITION STATUS      Continent Diet (Renal with fluid restriction (1234mL) Puree)  PHYSICIAN VISITS COMMUNICATION OF NEEDS Height & Weight Skin    Verbally   230 lbs. Normal          AMBULATORY STATUS RESPIRATION    Assist independent Normal      Personal Care Assistance Level of Assistance  Bathing, Dressing Bathing Assistance: Limited  assistance   Dressing Assistance: Limited assistance      Functional Limitations Info                SPECIAL CARE FACTORS FREQUENCY  PT (By licensed PT), OT (By licensed OT)     PT Frequency: 5x/week OT Frequency: 5x/week           Additional Factors Info  Allergies   Allergies Info: NKDA           Current Medications (09/08/2015): Current Facility-Administered Medications  Medication Dose Route Frequency Provider Last Rate Last Dose  . aspirin tablet 325 mg  325 mg Oral Daily Mercy Riding, MD   325 mg at 09/08/15 0947  . atorvastatin (LIPITOR) tablet 80 mg  80 mg Oral q1800 Mercy Riding, MD   80 mg at 09/07/15 2327  . Chlorhexidine Gluconate Cloth 2 % PADS 6 each  6 each Topical Q0600 Blane Ohara McDiarmid, MD   6 each at 09/08/15 [phone]  . clopidogrel (PLAVIX) tablet 75 mg  75 mg Oral Daily Mercy Riding, MD   75 mg at 09/08/15 0947  . dextrose 5 % solution   Intravenous Continuous Mercy Riding, MD      . fenofibrate tablet 54 mg  54 mg Oral Daily Mercy Riding, MD   54 mg at 09/08/15 0947  . mupirocin ointment (BACTROBAN) 2 % 1 application  1 application Nasal BID Blane Ohara McDiarmid, MD   1 application at 96/29/52 [phone]  . pantoprazole (PROTONIX) EC tablet 40 mg  40 mg Oral BID Mercy Riding, MD   40 mg at 09/08/15 0947  . senna-docusate (Senokot-S) tablet 1 tablet  1 tablet Oral Daily Mercy Riding, MD   1 tablet at 09/08/15 0947  . sodium chloride 0.9 % injection 3 mL  3 mL Intravenous Q12H Mercy Riding, MD   3 mL at 09/08/15 0947   Do not use this list as official medication orders. Please verify with discharge summary.  Discharge Medications:   Medication List    ASK your doctor about these medications        amantadine 100 MG capsule  Commonly known as:  SYMMETREL  Take 200 mg by mouth 2 (two) times daily.     amLODipine 10 MG tablet  Commonly known as:  NORVASC  Take 10 mg by mouth daily.     aspirin 325 MG tablet  Take 1 tablet (325 mg total) by mouth daily.      atorvastatin 80 MG tablet  Commonly known as:  LIPITOR  Take 1 tablet (80 mg total) by mouth daily at 6 PM.     cloNIDine 0.1 MG tablet  Commonly known as:  CATAPRES  Take 0.1 mg by mouth 2 (two) times daily.     clopidogrel 75 MG tablet  Commonly known as:  PLAVIX  75 mg by Gastrostomy Tube route daily.     feeding supplement (ENSURE ENLIVE) Liqd  Take 237 mLs by mouth 2 (two) times daily between meals.     Fenofibrate 40 MG Tabs  1 tablet by Gastrostomy Tube route every evening.     Fenofibrate 40 MG Tabs  Take 40 mg by mouth 2 (two) times daily.     FLUoxetine 20 MG capsule  Commonly known as:  PROZAC  20 mg by Gastrostomy Tube route daily.     hydrALAZINE 10 MG tablet  Commonly known as:  APRESOLINE  Take 10 mg by mouth every 8 (eight) hours.     hydrochlorothiazide 25 MG tablet  Commonly known as:  HYDRODIURIL  Take 25 mg by mouth daily.     HYDROcodone-acetaminophen 5-325 MG tablet  Commonly known as:  NORCO/VICODIN  Take one tablet by mouth every 6 hours as needed for pain. Not to exceed 3000mg  of APAP from all sources/24h     lisinopril 40 MG tablet  Commonly known as:  PRINIVIL,ZESTRIL  Take 40 mg by mouth daily.     minoxidil 2.5 MG tablet  Commonly known as:  LONITEN  Take 5 mg by mouth 2 (two) times daily.     Multiple Vitamins tablet  Take 1 tablet by mouth daily.     pantoprazole 40 MG tablet  Commonly known as:  PROTONIX  Take 1 tablet (40 mg total) by mouth 2 (two) times daily.     RESOURCE THICKENUP CLEAR Powd  Take 3,600 g by mouth as needed.     sennosides-docusate sodium 8.6-50 MG tablet  Commonly known as:  SENOKOT-S  Take 1 tablet by mouth daily.        Relevant Imaging Results:  Relevant Lab Results:  Recent Labs    Additional [place] Intern, [phone]

## 2015-09-08 NOTE — Progress Notes (Signed)
[name] Nhouyvanisvong, RN to bedside for swallow evaluation and reports that patient has passed. Pt reports that he was eating his food pureed at the nursing home. Note added to current diet order. Dorthey Sawyer, RN

## 2015-09-08 NOTE — Progress Notes (Signed)
   09/08/15 1045  Clinical Encounter Type  Visited With Patient  Visit Type Spiritual support  Referral From Nurse  Spiritual Encounters  Spiritual Needs Emotional  Stress Factors  Patient Stress Factors Health changes  The patient was experiencing agitation, and the sitter indicated that the patient had been that way for some time. Indicated to patient that I would keep him in my prayers.

## 2015-09-08 NOTE — Progress Notes (Signed)
Pt with several attempts at getting out of bed, pulling at IV tubing and removing telemetry box. No safety sitter available at this time. Pt moved to room closer to the nurses station with camera monitoring in 6E14. Will continue to monitor closely. Dorthey Sawyer, RN

## 2015-09-08 NOTE — H&P (Signed)
[facility] Admission History and Physical Service Pager: [phone]  Patient name: [name] Mclennan HallMedical record number: [id number] Date of birth: 25-Jun-1962Age: 54 y.o.Gender: male  Primary Care Provider: Gildardo Cranker, DO Consultants: none Code Status: full  Chief Complaint: fall and dizziness  Assessment and Plan: AADEN BUCKMAN is a 54 y.o. male presenting with altered mental status, dizziness (room spinning) and fall x2 last night. Patient is poor historian with decreased baseline mental status likely from CVA in July 2016. He has stent placed in RMCA and has been on plavix and aspirin. He is oriented to person, place and year, however he was unable to remember the day of the week or the month. PMH is significant for CVA in July 2016 s/p stent in [facility] with residual L hemiplegia (on ASA and Plavix), HTN, HLD and depression.  AMS/Dizziness/Fall: fall after rising from sitting to standing to walk to bathroom x2. Also notes dizzines when rising from supine to sitting or sitting to standing, suggestive for orthostatic hypotension. Patient appears dehydrated with dry mucus membranes on oral exam. However, vital signs within normal limit here. EKG read significant for QTc of 578, however appeared to be approximately 460 when calculated by hand. This is still concerning as he has been on clarithromycin for about two weeks for H. Pylori. Troponin negative. Hypoglycemia is another possibility. His CBG was 77 on presentation. He is also on amantadine which could cause drowsiness, dizziness, or blurred vision. It is not clear why he is on this medication. Ammonia level is 19. Slurred speech, left sided hemiplegia is concerning for CVA. He is on dual antiplatelet that increases his risk for hemorrhagic stroke but CT head negative. Ischemic stroke unlikely while on dual antiplatelet but can't rule out. He also has baseline hemiplegia and dysarthria. His photophobia is  concerning for meningitis. However, he doesn't look septic. No headache, objective fever or leukocytosis. UA significant for trace WBC's and moderate Hgb likely from catheterization in ED. No history of dysuria before presentation although he endorses one here after cath. - Admit to FPTS. Attending Dr. Wendy Poet - Orthostatic vitals - Monitor mental status, neuro checks q4hr - S/p 1L of NS in ED. D5-NS at 150 ml/hr for 12 hours and will give and additional 1L NS bolus - Continue PO ASA, plavix and Atorvastatin after SLP eval - Hold BP meds given soft blood pressures at presentation and resume as BP allows - Holding Amantadine. - Follow up blood and urine cultures. Note that per lab only one vial of blood was able to be obtained for culture. Consider reattempt when further hydrated. - Holding of antibiotics for now as he didn't look septic or infected.  - Monitor CBG - Will get another EKG.  - Avoid QT prolonging drugs. - Neurology consulted, appreciate recommendations - Sitter. Discussed with family that having member present may help with confusion--they will attempt to have family member in room when possible  AKI: sCr 4.47 on presentation. Baseline appears 0.8-0.9. He had one isolated elevated creatinine to 3.11 on 07/23/2015. FENA 1% suggestive for prerenal insult. BP 113/73. This might be low for him as his blood pressure is usually on the higher sides of normal. S/p a liter of NS in ED. Creatinine improved to 3.92 -D5NS @150  ml/hr and additional 1L bolus given - Holding Lisinopril.  - Continue to monitor  Hypernatremia: Na to 146. Urine sodium of 96. Likely from renal fluid loss secondary to diuretics. He is on HCTZ 25 mg  -  D5NS as above.  Hx of CVA: Occurred in June 2016. Had a stent placed in the R MCA. Pt with residual L hemiplegia. Has been on ASA and Plavix. Deficits appreciated on physical exam.  - Continue ASA and plavix after SLP eval - Bedside swallow evaluation - CT in  ED: no acute process  HTN: BPs 113/73 on arrival. On multiple meds at home. Hydralazine, HCTZ, Lisinopril, minoxidil, clonidine.  -Hold lisinopril given AKI and hypernatremia. Holding other antihypertensives as well at this time and will resume as BP tolerates - Continue to monitor  HLD: -Continue home Lipitor qd, Fenofibrate qd  Depression: Pt experienced depression after stroke - Continue home med: Prozac 20mg  qd  FEN/GI: -IVF as above -NPO until SLP eval, then initiate on renal diet  PPX: SCDs given history of recent significant GI bleed  Disposition: Pending improvement in mental status  History of Present Illness: [name] [name] is a 54 y.o. male presenting with altered mental status, dizziness (room spinning) and fall x2 last night. Patient is poor historian with decreased baseline mental status likely from CVA in July 2016 and acute change in mental status. He has stent placed in RMCA and has been on plavix and aspirin. He is oriented to person, place and year. Patient appears a little bit delirious as well with intention to get off bed and sit on physician stool.  Patient fell twice last night after rising from lying to standing to go to restroom. He denies hitting his head or loss of consciousness. He was able to get up by holding to the rails. He did not hit head. He also reports feeling dizzy rising from lying to sitting and sitting to standing. He reports fever last week "78 feet" and shortness of breath starting yesterday. He denies trouble breathing now. He says his weakness in left arm, which is a little different from baseline. He also states numbness of left arm but at baseline  He denies chest pain or pain else where. He reports some burning with urination after catheterization in ED, denies dysuria before coming in. He reports urinating a few hours ago. Asks if he can get stool softener for constipation. He reports getting all his medication at nursing home. Denies vision  changes. However, he states that lights bothers him. He is able to look at the TV and tell us what is going on. He has been eating and drinking well. Denies diarrhea, headache, shortness of breath or pain anywhere.  Off note, patient was hospitalized for GI bleed between 9/30 and 10/06 and received 5 untis of pRBC. His Hgb this time is 9.1 (8.5 on discharge).   Review Of Systems: Per HPI Otherwise the remainder of the systems were negative.  Patient Active Problem List   Diagnosis Date Noted  . Acute renal failure (ARF) ([place]) 09/07/2015  . Fall 09/07/2015  . Hypokalemia   . Gastrointestinal hemorrhage with melena   . Bleeding gastrointestinal   . H/O: stroke with residual effects   . GI bleed 08/08/2015  . Acute blood loss anemia 08/08/2015  . Acute CVA (cerebrovascular accident) ([place]) 06/02/2015  . Hemiparesis affecting left side as late effect of stroke ([place]) 06/02/2015  . Dyslipidemia 06/02/2015  . Type II diabetes mellitus with neurological manifestations ([place]) 06/02/2015  . Essential hypertension, malignant 06/02/2015  . Chronic constipation 06/02/2015  . Depression due to stroke ([place]) 06/02/2015  . Dysphagia as late effect of stroke 06/02/2015    Past Medical History: Past Medical History  Diagnosis Date  . Stroke ([place])   . Hypertension   . Hyperlipidemia   . Diabetes mellitus without complication ([place])   . Fall at home   . Hemiparesis affecting left side as late effect of stroke ([place])   . Dysphagia as late effect of cerebrovascular disease   . Acute respiratory failure ([place])   . Cerebral edema [facility])     Past Surgical History: Past Surgical History  Procedure Laterality Date  . Gastrostomy tube placement  05-19-15  . Mechanical thrombectomy angioplasty stenet placement    . Esophagogastroduodenoscopy N/A 08/12/2015    Procedure: ESOPHAGOGASTRODUODENOSCOPY (EGD);  Surgeon: Ladene Artist, MD; Location[facility] END[facility]COPY; Service: Endoscopy; Laterality: N/A;    Social History: Social History  Substance Use Topics  . Smoking status: Former Smoker    Types: Cigarettes  . Smokeless tobacco: None  . Alcohol Use: No   Additional social history: Please also refer to relevant sections of EMR.  Family History: Family History  Problem Relation Age of Onset  . Family history unknown: Yes    Allergies and Medications: No Known Allergies No current facility-administered medications on file prior to encounter.   Current Outpatient Prescriptions on File Prior to Encounter  Medication Sig Dispense Refill  . amantadine (SYMMETREL) 100 MG capsule Take 200 mg by mouth 2 (two) times daily.     Marland Kitchen amLODipine (NORVASC) 10 MG tablet Take 10 mg by mouth daily.     Marland Kitchen aspirin 325 MG tablet Take 1 tablet (325 mg total) by mouth daily. 30 tablet 0  . atorvastatin (LIPITOR) 80 MG tablet Take 1 tablet (80 mg total) by mouth daily at 6 PM. 30 tablet 0  . clopidogrel (PLAVIX) 75 MG tablet 75 mg by Gastrostomy Tube route daily.     . feeding supplement, ENSURE ENLIVE, (ENSURE ENLIVE) LIQD Take 237 mLs by mouth 2 (two) times daily between meals. 60 Bottle 12  . Fenofibrate 40 MG TABS 1 tablet by Gastrostomy Tube route every evening.     Marland Kitchen FLUoxetine (PROZAC) 20 MG capsule 20 mg by Gastrostomy Tube route daily.     . hydrALAZINE (APRESOLINE) 10 MG tablet Take 10 mg by mouth every 8 (eight) hours.     . hydrochlorothiazide (HYDRODIURIL) 25 MG tablet Take 25 mg by mouth daily.     Marland Kitchen HYDROcodone-acetaminophen (NORCO/VICODIN) 5-325 MG per tablet Take one tablet by mouth every 6 hours as needed for pain. Not to exceed 3000mg  of APAP from all sources/24h 120 tablet 0  . lisinopril (PRINIVIL,ZESTRIL) 40 MG tablet Take 40 mg by mouth daily.    . Maltodextrin-Xanthan Gum (RESOURCE THICKENUP  CLEAR) POWD Take 3,600 g by mouth as needed. 30 Can 0  . minoxidil (LONITEN) 2.5 MG tablet Take 5 mg by mouth 2 (two) times daily.     . Multiple Vitamins tablet Take 1 tablet by mouth daily.    . pantoprazole (PROTONIX) 40 MG tablet Take 1 tablet (40 mg total) by mouth 2 (two) times daily. 30 tablet 0  . sennosides-docusate sodium (SENOKOT-S) 8.6-50 MG tablet Take 1 tablet by mouth daily.       Objective: BP 114/72 mmHg  Pulse 75  Temp(Src) 98.3 F (36.8 C) (Oral)  Resp 20  Wt 230 lb (104.327 kg)  SpO2 95% Exam: Gen: appears confused at times but answers questions and follows commands. Looks restless.  Eyes: conjunctivae appears redish with some yellowish discharge. Nares: clear, no erythema, swelling or congestion Oropharynx: clear, dry mucus membrane CV: RRR.  S1 & S2 audible, no murmurs. Resp: no apparent WOB, CTAB. Abd: +BS. Soft, NDNT, no rebound or guarding.  Ext: No edema or gross deformities. Neuro:MS - alert, interactive. Appears a little confused at a times. Oriented to person, place, and year but not date or month. Dysarthria (slurred speech) Attention is waning and waxing.  Cranial Nerves - couldn't evaluate Pupils and EOMI due to photophobia and lack of cooperation; sensation intact, face symmetric with full strength of facial muscles, tongue protrusion is symmetric with full movement to both side. Sternocleidomastoid and trapezius are with normal strength.  Tone - Normal.  Strength - 4/5 on left UE and LE, 5/5 in right Plantar responses flexor bilaterally Coordination : No dysmetria on finger to nose. Shaking (tremor) on left side  Labs and Imaging: CBC BMET   Last Labs      Recent Labs Lab 09/07/15 0935  WBC 5.1  HGB 9.1*  HCT 29.2*  PLT 229      Last Labs      Recent Labs Lab 09/07/15 0935  NA 146*  K 4.0  CL 105  CO2 27  BUN 44*  CREATININE 4.47*  GLUCOSE 93  CALCIUM 10.0      Dg  Chest 2 View  09/07/2015 CLINICAL DATA: altered from norm. Pt normally wheels self to bathroom, has hx of CVA, also has dark urine per staff EXAM: CHEST - 2 VIEW COMPARISON: 08/08/2015 FINDINGS: Relatively low lung volumes with some subsegmental atelectasis at the right lung base, improved since previous. Heart size upper limits normal. Mildly tortuous thoracic aorta. No pneumothorax. No effusion. Mild spurring in the lower thoracic spine. IMPRESSION: 1. Improving right lower lobe atelectasis or infiltrate. No acute or superimposed abnormality. Electronically Signed By: Lucrezia Europe M.D. On: 09/07/2015 09:43   Ct Head Wo Contrast  09/07/2015 CLINICAL DATA: ALTERED MENTAL STATUS, NURSING FACILITY GIVES HX OF 2 FALLS YESTERDAY, LESS ORIENTED TODAY, HX DIABETES, HTN, CVA WITH LEFT SIDE HEMIPARESIS, DYSPHASIA, RECENT HOSPITALIZATION FOR GI BLEED, PLAVIX RX EXAM: CT HEAD WITHOUT CONTRAST TECHNIQUE: Contiguous axial images were obtained from the base of the skull through the vertex without intravenous contrast. Continued patient motion required rescanning x2. COMPARISON: 08/08/2015 FINDINGS: Right MCA stent. Atherosclerotic and physiologic intracranial calcifications. Old right MCA distribution infarct with encephalomalacia. No acute intracranial hemorrhage, midline shift, new parenchymal edema, mass or mass effect. There is moderate atrophy with prominence of ventricles and sulci which remain relatively symmetric. Acute infarct may be inapparent on noncontrast CT. CT bone windows reveal no calvarial lesion. IMPRESSION: 1. Negative for bleed or other acute intracranial process. 2. Stable old right MCA distribution infarct without hemorrhagic change. Electronically Signed By: Lucrezia Europe M.D. On: 09/07/2015 09:47    Mercy Riding, MD 09/07/2015, 2:09 PM PGY-1, [place] Intern pager: [phone], text pages welcome  Upper Level Addendum:  I have seen and evaluated this patient  along with Dr. Cyndia Skeeters and reviewed the above note, making necessary revisions in blue.   Dr. Junie Panning, DO, PGY2 09/07/2015; 5:33 PM

## 2015-09-08 NOTE — Clinical Social Work Note (Signed)
Clinical Social Work Assessment  Patient Details  Name: [name] [name] MRN: [id number] Date of Birth: Apr 07, 1961  Date of referral:  09/08/15               Reason for consult:  Facility Placement                Permission sought to share information with:  Facility Sport and exercise psychologist, Family Supports Permission granted to share information::  Yes, Verbal Permission Granted  Name::     Janann Colonel [phone]  Agency::  SNF  Relationship::  Daughter  Contact Information:     Housing/Transportation Living arrangements for the past 2 months:  [place] ([place]) Source of Information:  Adult Children Patient Interpreter Needed:  None Criminal Activity/Legal Involvement Pertinent to Current Situation/Hospitalization:  No - Comment as needed Significant Relationships:  Adult Children Lives with:  Facility Resident Do you feel safe going back to the place where you live?  Yes Need for family participation in patient care:  Yes (Comment)  Care giving concerns:  Physical Therapy recommends SNF at discharge. Patient came to the hospital from [facility] in [place] and patient's family is not happy with the care the patient is receiving at current SNF.   Social Worker assessment / plan:  BSW intern went to patient room to complete assessment, but patient was disoriented to time, situation, and location. Patient was oriented to self. BSW intern asked patient if it would be okay to contact family and patient agreed. BSW called patient daughter Janann Colonel [phone]) to complete assessment. Patient daughter stated that the patient had came from [place], but they were not happy with the care that the patient was receiving. BSW intern asked patient daughter if she wanted BSW intern to contact other facilities for a referral. Patient daughter was agreeable to that plan. Patient daughter stated that hospital staff could also  contact her brother Aida Raider [phone]) about care for the patient. BSW intern will fax out patient information to SNF facilities. BSW intern will continue to follow and assist as needed.   Employment status:  Disabled (Comment on whether or not currently receiving Disability) Insurance information:  Medicare PT Recommendations:  [place] / Referral to community resources:  [place]  Patient/Family's Response to care: Patient's family is not happy with the care that patient is receiving at current SNF. Patient family seems content with care at the hospital.  Patient/Family's Understanding of and Emotional Response to Diagnosis, Current Treatment, and Prognosis: Patient's family seems to have a good understanding of the patient's situation, diagnosis, and post discharge needs.  Emotional Assessment Appearance:  Appears stated age Attitude/Demeanor/Rapport:  Unable to Assess Affect (typically observed):  Unable to Assess Orientation:  Oriented to Self Alcohol / Substance use:  Not Applicable Psych involvement (Current and /or in the community):  No (Comment)  Discharge Needs  Concerns to be addressed:  Discharge Planning Concerns Readmission within the last 30 days:  No Current discharge risk:  Cognitively Impaired Barriers to Discharge:  Continued Medical Work up   [employer], [id number]

## 2015-09-09 DIAGNOSIS — N179 Acute kidney failure, unspecified: Secondary | ICD-10-CM | POA: Insufficient documentation

## 2015-09-09 DIAGNOSIS — R410 Disorientation, unspecified: Secondary | ICD-10-CM | POA: Insufficient documentation

## 2015-09-09 DIAGNOSIS — W19XXXS Unspecified fall, sequela: Secondary | ICD-10-CM

## 2015-09-09 LAB — CBC
HEMATOCRIT: 28.7 % — AB (ref 39.0–52.0)
HEMOGLOBIN: 9.2 g/dL — AB (ref 13.0–17.0)
MCH: 26.8 pg (ref 26.0–34.0)
MCHC: 32.1 g/dL (ref 30.0–36.0)
MCV: 83.7 fL (ref 78.0–100.0)
Platelets: 181 10*3/uL (ref 150–400)
RBC: 3.43 MIL/uL — ABNORMAL LOW (ref 4.22–5.81)
RDW: 15.3 % (ref 11.5–15.5)
WBC: 6 10*3/uL (ref 4.0–10.5)

## 2015-09-09 LAB — MRSA PCR SCREENING: MRSA by PCR: POSITIVE — AB

## 2015-09-09 LAB — BASIC METABOLIC PANEL
Anion gap: 11 (ref 5–15)
BUN: 18 mg/dL (ref 6–20)
CO2: 24 mmol/L (ref 22–32)
Calcium: 9.4 mg/dL (ref 8.9–10.3)
Chloride: 105 mmol/L (ref 101–111)
Creatinine, Ser: 1.5 mg/dL — ABNORMAL HIGH (ref 0.61–1.24)
GFR, EST AFRICAN AMERICAN: 58 mL/min — AB (ref >60)
GFR, EST NON AFRICAN AMERICAN: 50 mL/min — AB (ref >60)
Glucose, Bld: 87 mg/dL (ref 65–99)
Potassium: 3.2 mmol/L — ABNORMAL LOW (ref 3.5–5.1)
SODIUM: 140 mmol/L (ref 135–145)

## 2015-09-09 LAB — GLUCOSE, CAPILLARY
GLUCOSE-CAPILLARY: 103 mg/dL — AB (ref 65–99)
GLUCOSE-CAPILLARY: 66 mg/dL (ref 65–99)
Glucose-Capillary: 100 mg/dL — ABNORMAL HIGH (ref 65–99)
Glucose-Capillary: 208 mg/dL — ABNORMAL HIGH (ref 65–99)
Glucose-Capillary: 78 mg/dL (ref 65–99)
Glucose-Capillary: 80 mg/dL (ref 65–99)
Glucose-Capillary: 96 mg/dL (ref 65–99)

## 2015-09-09 LAB — RPR: RPR: NONREACTIVE

## 2015-09-09 LAB — HIV ANTIBODY (ROUTINE TESTING W REFLEX): HIV SCREEN 4TH GENERATION: NONREACTIVE

## 2015-09-09 MED ORDER — ENSURE ENLIVE PO LIQD
237.0000 mL | Freq: Two times a day (BID) | ORAL | Status: DC
  Administered 2015-09-09 – 2015-09-12 (×2): 237 mL via ORAL

## 2015-09-09 MED ORDER — AMLODIPINE BESYLATE 10 MG PO TABS
10.0000 mg | ORAL_TABLET | Freq: Every day | ORAL | Status: DC
  Administered 2015-09-09 – 2015-09-12 (×4): 10 mg via ORAL
  Filled 2015-09-09 (×4): qty 1

## 2015-09-09 NOTE — Progress Notes (Signed)
Initial Nutrition Assessment  DOCUMENTATION CODES:   Not applicable  INTERVENTION:   Provide Ensure Enlive po BID, each supplement provides 350 kcal and 20 grams of protein.  Encourage adequate PO intake.   NUTRITION DIAGNOSIS:   Increased nutrient needs related to acute illness as evidenced by estimated needs.  GOAL:   Patient will meet greater than or equal to 90% of their needs  MONITOR:   PO intake, Supplement acceptance, Weight trends, Labs, I & O's  REASON FOR ASSESSMENT:   Malnutrition Screening Tool    ASSESSMENT:   54 y.o. male presenting with altered mental status, dizziness (room spinning) and fall x2. Decreased mental status at baseline, likely from CVA in July 2016. On Plavix and ASA from stent in R MCA. PMH is significant for CVA in July 2016 s/p stent in [facility] with residual L hemiplegia (on ASA and Plavix), HTN, HLD and depression.  Food at trays are received pureed. Meal completion has been 45%. Pt reports eating well PTA with consumption of at least 2 meals a day with no other difficulties. Per Epic weight records, pt with a 11% weight loss in 4 months. Question weight accuracy. Pt was unable to determine usual body weight. RD to order Ensure to aid in caloric and protein needs. Pt was encouraged to eat his food at meals.   Pt with no observed significant fat or muscle mass loss.   Labs and medications reviewed.   Diet Order:  DIET - DYS 1 Room service appropriate?: Yes; Fluid consistency:: Thin; Fluid restriction:: 1200 mL Fluid  Skin:  Reviewed, no issues  Last BM:  11/1  Height:   Ht Readings from Last 1 Encounters:  09/09/15 6\' 2"  (1.88 m)    Weight:   Wt Readings from Last 1 Encounters:  09/07/15 230 lb (104.327 kg)    Ideal Body Weight:  86.36 kg  BMI:  Body mass index is 29.52 kg/(m^2).  Estimated Nutritional Needs:   Kcal:  2100-2300  Protein:  105-115 grams  Fluid:  1.2 L/day  EDUCATION NEEDS:   No education needs  identified at this time  Corrin Parker, MS, RD, LDN Pager # [phone] After hours/ weekend pager # [phone]

## 2015-09-09 NOTE — Progress Notes (Signed)
Subjective: patient has no complaints.  Mentation has improved.   Exam: Filed Vitals:   09/09/15 0444  BP: 145/87  Pulse:   Temp: 98.3 F (36.8 C)  Resp: 16    HEENT-  Normocephalic, no lesions, without obvious abnormality.  Normal external eye and conjunctiva.  Normal TM's bilaterally.  Normal auditory canals and external ears. Normal external nose, mucus membranes and septum.  Normal pharynx. Cardiovascular- S1, S2 normal, pulses palpable throughout   Lungs- chest clear, no wheezing, rales, normal symmetric air entry Abdomen- normal findings: bowel sounds normal Extremities- no edema Lymph-no adenopathy palpable Musculoskeletal-no joint tenderness, deformity or swelling Skin-warm and dry, no hyperpigmentation, vitiligo, or suspicious lesions   Gen: In bed, NAD MS: Alert  Oriented to [place], [place], [place], president, follows commands. Speech dysarthtic CN: PERRLA, vision intact, Left facial droop with decreased sensation on left side Motor: 5/5 on the right, 4/5 on the left UE and 5/5 on left LE Sensory: PP/LT decreased on the left UE DTR: 2+ symmetric with no AJ  Pertinent Labs: Cr 1.5 with GFR 50   Etta Quill PA-C [facility] [phone]  Impression: 54 year old male presenting with mental status changes. Mental status has improved with improving renal function.  With his improvement going along with his improving renal function, I think that this is the likely culprit and would not recommend further workup a this time.    Recommendations: 1) no further recommendations at this time. Please call with questions.    Roland Rack, MD [facility] [phone]  If 7pm- 7am, please page neurology on call as listed in [place].  09/09/2015, 10:33 AM

## 2015-09-09 NOTE — Progress Notes (Signed)
Patient ID: [name] [name], male   DOB: 1961/05/11, 54 y.o.   MRN: [id number]    Facility: [facility]      No Known Allergies  Chief Complaint  Patient presents with  . Medical Management of Chronic Issues    HPI:  He is a long term resident of this facility being seen for the management of his chronic illnesses. His peg tube has been removed with his and his family's request. The peg tube has not been used. The tube feeding was stopped on 07-04-15. His blood pressure is elevated.    Past Medical History  Diagnosis Date  . Stroke ([place])   . Hypertension   . Hyperlipidemia   . Diabetes mellitus without complication ([place])   . Fall at home   . Hemiparesis affecting left side as late effect of stroke ([place])   . Dysphagia as late effect of cerebrovascular disease   . Acute respiratory failure ([place])   . Cerebral edema [facility])     Past Surgical History  Procedure Laterality Date  . Gastrostomy tube placement  05-19-15  . Mechanical thrombectomy angioplasty stenet placement    . Esophagogastroduodenoscopy N/A 08/12/2015    Procedure: ESOPHAGOGASTRODUODENOSCOPY (EGD);  Surgeon: Ladene Artist, MD;  Location: [facility] ENDOSCOPY;  Service: Endoscopy;  Laterality: N/A;    VITAL SIGNS BP 154/88 mmHg  Pulse 88  Ht 6' (1.829 m)  Wt 247 lb (112.038 kg)  BMI 33.49 kg/m2  Patient's Medications  New Prescriptions   No medications on file  Previous Medications   AMANTADINE (SYMMETREL) 100 MG CAPSULE    Take 200 mg by mouth 2 (two) times daily.    AMLODIPINE (NORVASC) 10 MG TABLET    Take 10 mg by mouth daily.    ASPIRIN 325 MG TABLET    Take 1 tablet (325 mg total) by mouth daily.   ATORVASTATIN (LIPITOR) 80 MG TABLET    Take 1 tablet (80 mg total) by mouth daily at 6 PM.   CLONIDINE (CATAPRES) 0.1 MG TABLET    Take 0.1 mg by mouth 2 (two) times daily.   CLOPIDOGREL (PLAVIX) 75 MG TABLET    75 mg by Gastrostomy Tube route daily.    FEEDING SUPPLEMENT, ENSURE ENLIVE, (ENSURE  ENLIVE) LIQD    Take 237 mLs by mouth 2 (two) times daily between meals.   FENOFIBRATE 40 MG TABS    1 tablet by Gastrostomy Tube route every evening.    FENOFIBRATE 40 MG TABS    Take 40 mg by mouth 2 (two) times daily.   FLUOXETINE (PROZAC) 20 MG CAPSULE    20 mg by Gastrostomy Tube route daily.    HYDROCHLOROTHIAZIDE (HYDRODIURIL) 25 MG TABLET    Take 25 mg by mouth daily.    HYDROCODONE-ACETAMINOPHEN (NORCO/VICODIN) 5-325 MG PER TABLET    Take one tablet by mouth every 6 hours as needed for pain. Not to exceed 3000mg  of APAP from all sources/24h   LISINOPRIL (PRINIVIL,ZESTRIL) 40 MG TABLET    Take 40 mg by mouth daily.   MINOXIDIL (LONITEN) 2.5 MG TABLET    Take 5 mg by mouth 2 (two) times daily.    MULTIPLE VITAMINS TABLET    Take 1 tablet by mouth daily.   PANTOPRAZOLE (PROTONIX) 40 MG TABLET    Take 1 tablet (40 mg total) by mouth 2 (two) times daily.   SENNOSIDES-DOCUSATE SODIUM (SENOKOT-S) 8.6-50 MG TABLET    Take 1 tablet by mouth daily.   Modified Medications  No medications on file  Discontinued Medications   No medications on file     SIGNIFICANT DIAGNOSTIC EXAMS   04-28-15: ct of head: 1. Right medial parietal hematoma, stable. 2. 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 No new hemorrhage   05-08-15: ct of head: 1. Findings consistent with evolution of patchy regions of right MCA infarction, status post thrombectomy and stenting of M1. 2. Negative for hemorrhage  05-08-15: MRI of brain: 1. Findings consistent with large region of acute ischemia int he right MCA distrubution. On evidence of hemorrhage. 2. Interval progression of chronic small vessel ischemic changes.   05-08-15: 2-d echo: mild LV dysfunction EF 50%; with moderate LVH; normal LA pressures with diastolic dysfunction. Normal right ventricular systolic function. Valvular regurgitation: trivial MR; trivial TR; no valvular stenosis.    05-09-15: EKG: sinus bradycardia   05-09-15: chest x-ry: 1.  Stable enlarged cardiac and mediastinal contours. 2. Small left pleural effusion with underlying atelectasis   05-09-15: diagnostic cerebral angiogram: 1. Right common femoral angiogram demonstrates satisfactory placement of sheath within the right common femoral artery, above the common femoral bifurcation and below  the origin of the inferior epigraastric artery with adequate vascular caliber for use of an arterial closure device. Right common carotid angiography demonstrates stable positioning and configuration of right mca enterprise stent device with preserved stent patency an antegrade opacification of distal right mca territory. Stable positioning and configuration of trigt MCA M1 segment enterprise stent device with preserved stent patency and antegrade opacification of right MCA territory   05-11-15: left upper extremity venous ultrasound: negative for dvt  05-28-15: ct of head; 1. Right medial parietal hematoma , stable. 2. findings of right MCA territory laminar necrosis unchanged. No new hemorrhage   LABS REVIEWED:   05-08-15: hgb a1c 5.9; chol 253; trig 424; hdl 47  05-26-15: glucose 126; bun 20; creat 1.0; k+4.0; na++ 134 05-28-15: wbc 5.4; hgb 12.0; hct 34.6; mcv 86; plt 233; glucose 106; bun 26; creat 1.6; k+3.8; na++ 138 05-29-15: wbc 5.7; hgb 12.6; hct 36.6; mcv 86; plt 215;glcuose 156; bun 18; creat 1.1; k+3.8; na++ 135     Review of Systems Constitutional: Negative for appetite change and fatigue.  HENT: Negative for congestion.   Respiratory: Negative for cough, chest tightness and shortness of breath.   Cardiovascular: Negative for chest pain, palpitations and leg swelling.  Gastrointestinal: Negative for nausea, abdominal pain, diarrhea and constipation.  Musculoskeletal: Negative for myalgias and arthralgias.       He has left side weakness present   Skin: Negative for pallor.  Neurological: Negative for dizziness.  Psychiatric/Behavioral: The patient is not  nervous/anxious.     Physical Exam Vitals reviewed. Constitutional: No distress.  Obese   Eyes: Conjunctivae are normal.  Neck: Neck supple. No JVD present. No thyromegaly present.  Cardiovascular: Normal rate, regular rhythm and intact distal pulses.   Respiratory: Effort normal and breath sounds normal. No respiratory distress. He has no wheezes.  GI: Soft. Bowel sounds are normal. He exhibits no distension. There is no tenderness.   Musculoskeletal: He exhibits no edema.  Has left hemiparesis with gross movement present  Is able to move right side extremities.    Lymphadenopathy:    He has no cervical adenopathy.  Neurological: He is alert.  Skin: Skin is warm and dry. He is not diaphoretic.  Psychiatric: He has a normal mood and affect.     ASSESSMENT/ PLAN:  1.  Acute cva with left hemiparesis:  is neurologically stable; will continue therapy as directed to improve upon mobility strength and independence with adl's will continue asa 81 mg daily; and plavix 75 mg daily will continue amantadine 200 mg twice daily for abnormal movements and will monitor his status.   2. Hypertension: will continue norvasc 10 mg daily; hctz 25 mg daily; lisinopril 40 mg daily minoxidil 5 mg twice daily will begin hydralazine 10 mg every 8 hours and will have nursing check blood pressure three times daily   3. Dyslipidemia: will continue lipitor 80 mg daily and will begin fenofibrate 40 mg daily trig 424 and total cholesterol 253 will check lipids   4. Diabetes with neurological effects: his hgb a1c is 5.9; diet controlled; will have nursing check blood pressure twice daily and will monitor  5. Constipation: will continue senna s 2 tabs twice daily   6. Depression: will continue prozac 20 mg daily will monitor his status.   7. Dysphagia: no signs of aspiration present;is on thin liquids; peg tube has been removed.     Ok Edwards NP [facility]  Contact [phone] Monday  through Friday 8am- 5pm  After hours call [phone]

## 2015-09-09 NOTE — Evaluation (Signed)
Physical Therapy Evaluation Patient Details Name: [name] [name] MRN: [id number] DOB: 1960-11-23 Today's Date: 09/09/2015   History of Present Illness  Pt is a 54 y.o. male presented to ED 9/30 with GI bleed. Recently admitted for CVA with residual L hemiplegia. Pt had R MCA stroke july 2016.  Clinical Impression  Pt with noted confusion, L residual hemiplegia, impaired balance and increased falls risk in addition to cognitive deficits. Pt requires 24/7 assist for safety and mobility. Pt remains appropriate for SNF upon d/c to maximize functional recovery.    Follow Up Recommendations SNF;Supervision/Assistance - 24 hour    Equipment Recommendations  None recommended by PT    Recommendations for Other Services       Precautions / Restrictions Precautions Precautions: Fall Precaution Comments: poor historian, AMS, confusion Restrictions Weight Bearing Restrictions: No      Mobility  Bed Mobility Overal bed mobility: Needs Assistance Bed Mobility: Supine to Sit     Supine to sit: Min assist     General bed mobility comments: v/c's for safey, minA for trunk elevation  Transfers Overall transfer level: Needs assistance Equipment used: 2 person hand held assist Transfers: Sit to/from [facility] Sit to Stand: Mod assist Stand pivot transfers: Mod assist       General transfer comment: max directional v/c's for sequencing. pt with wide base of support and short shuffled steps. completed std pvt to bsc and then to chair. extremely difficulty follow task and processing directions but aware he had to urinate since condom cath came off  Ambulation/Gait             General Gait Details: pt limited to 5 steps to commode with maxAx2 to maintain balalnce  Stairs            Wheelchair Mobility    Modified Rankin (Stroke Patients Only)       Balance Overall balance assessment: Needs assistance Sitting-balance support: Feet supported;No upper  extremity supported Sitting balance-Leahy Scale: Fair     Standing balance support: Bilateral upper extremity supported Standing balance-Leahy Scale: Poor Standing balance comment: pt very shaky and unsteady                             Pertinent Vitals/Pain Pain Assessment: No/denies pain    Home Living Family/patient expects to be discharged to:: Skilled nursing facility                 Additional Comments: pt was at [facility]    Prior Function Level of Independence: Needs assistance   Gait / Transfers Assistance Needed: pt poor historian, unsure if patient was ambulatory  ADL's / Homemaking Assistance Needed: suspect staff assisted with all ADLS, pt poor historian        Hand Dominance   Dominant Hand: Right    Extremity/Trunk Assessment   Upper Extremity Assessment: LUE deficits/detail       LUE Deficits / Details: residual hemiplegia   Lower Extremity Assessment: LLE deficits/detail   LLE Deficits / Details: residual weakness from R MCA in july  Cervical / Trunk Assessment: Normal  Communication   Communication: Expressive difficulties  Cognition Arousal/Alertness: Awake/alert Behavior During Therapy: Restless Overall Cognitive Status: Impaired/Different from baseline Area of Impairment: Orientation;Following commands;Safety/judgement;Problem solving Orientation Level: Disoriented to;Time;Situation;Place     Following Commands: Follows one step commands inconsistently Safety/Judgement: Decreased awareness of safety;Decreased awareness of deficits   Problem Solving: Slow processing;Decreased initiation;Difficulty sequencing;Requires verbal  cues;Requires tactile cues General Comments: unsure of baseline cognition. Pt able to report date, place, and president once given 2 choices    General Comments      Exercises        Assessment/Plan    PT Assessment Patient needs continued PT services  PT Diagnosis Difficulty  walking;Hemiplegia non-dominant side;Generalized weakness   PT Problem List Decreased strength;Decreased range of motion;Decreased activity tolerance;Decreased balance;Decreased mobility  PT Treatment Interventions DME instruction;Gait training;Stair training;Functional mobility training;Therapeutic activities;Therapeutic exercise;Balance training;Neuromuscular re-education   PT Goals (Current goals can be found in the Care Plan section) Acute Rehab PT Goals Patient Stated Goal: none stated PT Goal Formulation: Patient unable to participate in goal setting Time For Goal Achievement: 09/23/15 Potential to Achieve Goals: Fair    Frequency Min 2X/week   Barriers to discharge        Co-evaluation               End of Session Equipment Utilized During Treatment: Gait belt Activity Tolerance: Patient tolerated treatment well Patient left: in chair;with nursing/sitter in room;with call bell/phone within reach Nurse Communication: Mobility status         Time: 1220-1243 PT Time Calculation (min) (ACUTE ONLY): 23 min   Charges:   PT Evaluation $Initial PT Evaluation Tier I: 1 Procedure PT Treatments $Therapeutic Activity: 8-22 mins   PT G CodesKingsley Callander 09/09/2015, 1:45 PM   Kittie Plater, PT, DPT Pager #: [phone] Office #: [phone]

## 2015-09-09 NOTE — Evaluation (Signed)
Occupational Therapy Evaluation Patient Details Name: [name] [name] MRN: [id number] DOB: 07-26-1961 Today's Date: 09/09/2015    History of Present Illness Pt is a 54 y.o. male presented with AMS, fall x2 and dizziness. PMH: Pt was hospitalized from 9/30 to 10/06 due to GI bleeding.   Clinical Impression   Pt admitted to hospital due to reason stated above. Pt currently with functional limitiations due to the deficits listed below (see OT problem list). Prior to admission pt reported staff at SNF provided assistance with ADLs as well as provided him with pictures to assist with ADL completion. Pt currently requires set up to total assistance ADLs. Session limited due to pt having x2 bowel movements during bed mobility. Pt oriented to person and place, however could not report month and year stated year was "2006". Pt educated in frequent skin checks and pressure relief to decrease risk of decubitis ulcers. Pt will benefit from skilled OT to increase their independence and safety with ADLs and balance to allow discharge to venue listed below.    Follow Up Recommendations  SNF    Equipment Recommendations  Other (comment) (Defer to next venue)    Recommendations for Other Services       Precautions / Restrictions Precautions Precautions: Fall Restrictions Weight Bearing Restrictions: No      Mobility Bed Mobility Overal bed mobility: Needs Assistance Bed Mobility: Rolling Rolling: Independent         General bed mobility comments: verbal cues to initiate bed mobility for skin checks and pressure relief, however pt begins to have bowel movements each time he rolled to right or left.  Transfers                 General transfer comment: Pt polietly decline any transfer due to bowel movements each time he moved    Balance                                            ADL Overall ADL's : Needs assistance/impaired     Grooming: Wash/dry face;Set  up;Bed level                                 General ADL Comments: Pt is set up to total assist for ADLs at the time. Educated pt in skin check and pressure relief to decrease risk of decubitis ulcers.     Vision     Perception     Praxis      Pertinent Vitals/Pain Pain Assessment: No/denies pain     Hand Dominance Right   Extremity/Trunk Assessment Upper Extremity Assessment Upper Extremity Assessment: LUE deficits/detail LUE Deficits / Details:  LUE hemiplegia, pt able to actively perform shoulder flexion, elbow flexion/extension, wrist flexion and extension. However, has slight deficits with pronationa/supination and digit flexion/extension LUE Coordination: decreased fine motor   Lower Extremity Assessment Lower Extremity Assessment: Defer to PT evaluation       Communication Communication Communication: Expressive difficulties   Cognition Arousal/Alertness: Awake/alert Behavior During Therapy: WFL for tasks assessed/performed Overall Cognitive Status: No family/caregiver present to determine baseline cognitive functioning Area of Impairment: Orientation Orientation Level: Disoriented to;Time             General Comments: Pt could not recall the month and reports the year being 2006   General Comments  noted bruise posteriorly on pt right leg    Exercises       Shoulder Instructions      Home Living Family/patient expects to be discharged to:: Skilled nursing facility                                        Prior Functioning/Environment Level of Independence: Needs assistance    ADL's / Homemaking Assistance Needed: pt reports staff at SNF was provided physical assisting with ADLs. Pt reports staff would provide pictures as well        OT Diagnosis: Generalized weakness;Acute pain;Hemiplegia non-dominant side;Altered mental status   OT Problem List: Decreased activity tolerance;Impaired balance (sitting and/or  standing);Decreased cognition;Decreased knowledge of use of DME or AE;Pain   OT Treatment/Interventions: Self-care/ADL training;Therapeutic exercise;Therapeutic activities;Cognitive remediation/compensation;Patient/family education    OT Goals(Current goals can be found in the care plan section) Acute Rehab OT Goals Patient Stated Goal: none stated OT Goal Formulation: With patient Time For Goal Achievement: 09/23/15 Potential to Achieve Goals: Fair ADL Goals Pt Will Perform Grooming: with min assist;sitting Pt Will Perform Upper Body Bathing: with mod assist;sitting Pt Will Perform Lower Body Bathing: with mod assist;sitting/lateral leans Pt Will Perform Upper Body Dressing: with mod assist;sitting Pt Will Perform Lower Body Dressing: with mod assist;with adaptive equipment;sitting/lateral leans;sit to/from stand Pt Will Transfer to Toilet: with +2 assist;with max assist;stand pivot transfer;bedside commode Pt Will Perform Toileting - Clothing Manipulation and hygiene: with mod assist;sitting/lateral leans Pt/caregiver will Perform Home Exercise Program: Increased ROM;Increased strength;Both right and left upper extremity;With theraband;With Supervision  OT Frequency: Min 2X/week   Barriers to D/C:            Co-evaluation              End of Session    Activity Tolerance: Patient tolerated treatment well Patient left: in bed;with call bell/phone within reach;with nursing/sitter in room   Time: 1007-1219 OT Time Calculation (min): 22 min Charges:  OT General Charges $OT Visit: 1 Procedure OT Evaluation $Initial OT Evaluation Tier I: 1 Procedure G-Codes:    Lin Landsman September 11, 2015, 9:26 AM

## 2015-09-09 NOTE — Clinical Social Work Placement (Signed)
   CLINICAL SOCIAL WORK PLACEMENT  NOTE  Date:  09/09/2015  Patient Details  Name: CAVON NICOLLS MRN: [id number] Date of Birth: 10/01/1961  Clinical Social Work is seeking post-discharge placement for this patient at the [place] level of care (*CSW will initial, date and re-position this form in  chart as items are completed):  Yes   Patient/family provided with [facility] Department's list of facilities offering this level of care within the geographic area requested by the patient (or if unable, by the patient's family).  Yes   Patient/family informed of their freedom to choose among providers that offer the needed level of care, that participate in Medicare, Medicaid or managed care program needed by the patient, have an available bed and are willing to accept the patient.  Yes   Patient/family informed of [place]'s ownership interest in [facility] and [facility], as well as of the fact that they are under no obligation to receive care at these facilities.  PASRR submitted to EDS on       PASRR number received on       Existing PASRR number confirmed on 09/08/15     FL2 transmitted to all facilities in geographic area requested by pt/family on       FL2 transmitted to all facilities within larger geographic area on       Patient informed that his/her managed care company has contracts with or will negotiate with certain facilities, including the following:            Patient/family informed of bed offers received.  Patient chooses bed at       Physician recommends and patient chooses bed at      Patient to be transferred to   on  .  Patient to be transferred to facility by       Patient family notified on   of transfer.  Name of family member notified:        PHYSICIAN       Additional Comment:  Liz Beach MSW, LCSW, [place], [phone]  _______________________________________________ Rigoberto Noel,  LCSW 09/09/2015, 2:43 PM

## 2015-09-09 NOTE — Progress Notes (Signed)
Family Medicine Teaching Service Daily Progress Note Intern Pager: [phone]  Patient name: [name] [name] Medical record number: [id number] Date of birth: 22-Aug-1961 Age: 54 y.o. Gender: male  Primary Care Provider: Gildardo Cranker, DO Consultants: neurology Code Status: full  Pt Overview and Major Events to Date:  10/30-admitted with AMS, Fall and AKI  Assessment and Plan: [name] [name] is a 54 y.o. male presenting with altered mental status, dizziness (room spinning) and fall x2. Decreased mental status at baseline, likely from CVA in July 2016. On Plavix and ASA from stent in R MCA. PMH is significant for CVA in July 2016 s/p stent in [facility] with residual L hemiplegia (on ASA and Plavix), HTN, HLD and depression.  AMS/Dizziness/Fall: Likely orthostatic hypotension, as Pt was dehydrated on presentation. EKG significant for QTc prolongation. Troponin negative. Slurred speech, left sided hemiplegia is concerning for CVA. CT head negative. This morning, Pt oriented to person and place, but thought the year was 2006. - Monitor mental status, neuro checks q4hr - Continue PO ASA, Plavix and Atorvastatin. - BP meds held on admission for SBPs ranging from 113-115. Will restart Norvasc 10mg  today, as BPs have ranged from 130-158/81-106 in the last 24 hours.  - Holding home Amantadine because can cause dizziness and confusion. - Folate WNL, B12 high, TSH WNL, HIV negative, RPR non-reactive, sed rate elevated at 77. - Blood and urine cultures NG x 1 day. Will continue to hold off on antibiotics, as Pt does not appear sick. - Neurology consulted, appreciate recommendations. They believe his AMS is secondary to metabolic issues due to renal insufficiency.  - PT/OT consulted, appreciate recommendations. - Sitter at bedside.   AKI: Cr 4.47 on presentation. Decreased from 2.24 yesterday to 1.50 today. Baseline appears 0.8-0.9. FENA 1%, suggestive for prerenal insult.  - Fluids have been stopped. If he does  not eat or drink well today, will restart IVFs. - Have been holding BP meds. Will restart Norvasc 10mg  today.   Hypernatremia, resolved: Na to 146 on presentation, 140 this am. Urine sodium of 96. Likely from renal fluid loss secondary to diuretics (HCTZ 25mg  qd at home). On admission, was calculated to have a free water deficit of 3L.  - Will monitor with daily BMPs.  QT Prolongation: QTc 578 on 10/30 and 579 on 10/31. Pt has been on Clarithromycin x 2 weeks for H. Pylori treatment. - Clarithromycin has been stopped - Avoid QT prolonging drugs  Hx of CVA: Occurred in June 2016. Had a stent placed in the R MCA. Pt with residual L hemiplegia. On ASA and Plavix. CT head negative for acute intracranial process. - Continue ASA and Plavix  HTN: BPs ranging from 130-158/81-106 in the last 24 hours. Home regimen: Amlodipine 10mg  qd, Hydralazine 10mg  q8hrs, HCTZ 25mg  qd, Lisinopril 40mg  qd, Minoxidil 5mg  bid, Clonidine 0.1mg  bid.  - Will restart Norvasc 10mg  this morning. Do not want to start Lisinopril or HCTZ at this time, given his AKI. - Continue to monitor  HLD: -Continue home Lipitor qd, Fenofibrate qd  Depression: Pt experienced depression after stroke. - Continue home med: Prozac 20mg  daily.   FEN/GI: - Renal diet - Not currently on any fluids. Will restart if he doesn't eat well today.  PPX: SCDs given history of recent significant GI bleed. On ASA and Plavix for stent.  Disposition: Pending improvement in mental status.  Came from ALF ([place]), but family would like him to go elsewhere. Social work following.  Subjective:  Mr. Spoelstra states that he feels fine today. He feels "better than when he came in". He denies any chest pain or shortness of breath. No concerns this morning.  Objective: Temp:  [98.3 F (36.8 C)-98.9 F (37.2 C)] 98.3 F (36.8 C) (11/01 0444) Pulse Rate:  [77-91] 77 (10/31 2051) Resp:  [16-20] 16 (11/01 0444) BP:  (130-158)/(81-106) 145/87 mmHg (11/01 0444) SpO2:  [95 %-100 %] 96 % (11/01 0444) Physical Exam: Gen: Laying in bed, appears comfortable, in NAD HEENT: [place]/AT, EOMI, MMM CV: RRR, no murmurs. Resp: Normal WOB, lungs CTAB. Abd: +BS. Soft, NDNT, no rebound or guarding.  Ext: No edema or gross deformities. Neuro: Awake, alert. Oriented to person and place, but thinks the year is 2006. 4/5 muscle strength in LLE and LUE. 5/5 muscle strength in RLE and RUE. Tremor noted in LUE.   Laboratory:  Recent Labs Lab 09/07/15 0935 09/08/15 0710  WBC 5.1 5.8  HGB 9.1* 8.7*  HCT 29.2* 27.9*  PLT 229 202    Recent Labs Lab 09/07/15 0935 09/07/15 1642 09/08/15 0710 09/08/15 1230  NA 146*  --  147* 145  K 4.0  --  3.6 3.4*  CL 105  --  113* 106  CO2 27  --  24 24  BUN 44*  --  32* 28*  CREATININE 4.47* 3.92* 2.38* 2.24*  CALCIUM 10.0  --  9.6 9.5  PROT 7.8  --   --  7.0  BILITOT 0.5  --   --  0.6  ALKPHOS 133*  --   --  130*  ALT 7*  --   --  8*  AST 16  --   --  22  GLUCOSE 93  --  105* 94    Imaging/Diagnostic Tests: CT head (10/30): No acute intracranial process  Sela Hua, MD 09/09/2015, 6:33 AM PGY-1, [place] Intern pager: [phone], text pages welcome

## 2015-09-10 DIAGNOSIS — F03918 Unspecified dementia, unspecified severity, with other behavioral disturbance: Secondary | ICD-10-CM | POA: Insufficient documentation

## 2015-09-10 DIAGNOSIS — F0391 Unspecified dementia with behavioral disturbance: Secondary | ICD-10-CM

## 2015-09-10 LAB — GLUCOSE, CAPILLARY
GLUCOSE-CAPILLARY: 101 mg/dL — AB (ref 65–99)
GLUCOSE-CAPILLARY: 66 mg/dL (ref 65–99)
GLUCOSE-CAPILLARY: 71 mg/dL (ref 65–99)
GLUCOSE-CAPILLARY: 72 mg/dL (ref 65–99)
GLUCOSE-CAPILLARY: 93 mg/dL (ref 65–99)
Glucose-Capillary: 67 mg/dL (ref 65–99)
Glucose-Capillary: 75 mg/dL (ref 65–99)
Glucose-Capillary: 77 mg/dL (ref 65–99)
Glucose-Capillary: 77 mg/dL (ref 65–99)

## 2015-09-10 LAB — BASIC METABOLIC PANEL
ANION GAP: 10 (ref 5–15)
BUN: 14 mg/dL (ref 6–20)
CALCIUM: 9.1 mg/dL (ref 8.9–10.3)
CO2: 26 mmol/L (ref 22–32)
Chloride: 104 mmol/L (ref 101–111)
Creatinine, Ser: 1.44 mg/dL — ABNORMAL HIGH (ref 0.61–1.24)
GFR calc Af Amer: >60 mL/min (ref >60)
GFR, EST NON AFRICAN AMERICAN: 53 mL/min — AB (ref >60)
GLUCOSE: 95 mg/dL (ref 65–99)
Potassium: 3 mmol/L — ABNORMAL LOW (ref 3.5–5.1)
SODIUM: 140 mmol/L (ref 135–145)

## 2015-09-10 LAB — CBC WITH DIFFERENTIAL/PLATELET
BASOS ABS: 0 10*3/uL (ref 0.0–0.1)
BASOS PCT: 0 %
EOS PCT: 3 %
Eosinophils Absolute: 0.2 10*3/uL (ref 0.0–0.7)
HCT: 28.8 % — ABNORMAL LOW (ref 39.0–52.0)
Hemoglobin: 9.1 g/dL — ABNORMAL LOW (ref 13.0–17.0)
Lymphocytes Relative: 26 %
Lymphs Abs: 1.5 10*3/uL (ref 0.7–4.0)
MCH: 26.2 pg (ref 26.0–34.0)
MCHC: 31.6 g/dL (ref 30.0–36.0)
MCV: 83 fL (ref 78.0–100.0)
MONO ABS: 0.6 10*3/uL (ref 0.1–1.0)
Monocytes Relative: 11 %
Neutro Abs: 3.5 10*3/uL (ref 1.7–7.7)
Neutrophils Relative %: 60 %
PLATELETS: 192 10*3/uL (ref 150–400)
RBC: 3.47 MIL/uL — AB (ref 4.22–5.81)
RDW: 15.2 % (ref 11.5–15.5)
WBC: 5.9 10*3/uL (ref 4.0–10.5)

## 2015-09-10 MED ORDER — DONEPEZIL HCL 5 MG PO TABS
5.0000 mg | ORAL_TABLET | Freq: Every day | ORAL | Status: DC
  Administered 2015-09-10 – 2015-09-11 (×2): 5 mg via ORAL
  Filled 2015-09-10 (×2): qty 1

## 2015-09-10 MED ORDER — HYDRALAZINE HCL 10 MG PO TABS
10.0000 mg | ORAL_TABLET | Freq: Three times a day (TID) | ORAL | Status: DC
  Administered 2015-09-10 – 2015-09-12 (×7): 10 mg via ORAL
  Filled 2015-09-10 (×7): qty 1

## 2015-09-10 NOTE — Progress Notes (Signed)
Family Medicine Teaching Service Daily Progress Note Intern Pager: [phone]  Patient name: [name] [name] Medical record number: [id number] Date of birth: 1961/08/16 Age: 54 y.o. Gender: male  Primary Care Provider: Gildardo Cranker, DO Consultants: neurology Code Status: full  Pt Overview and Major Events to Date:  10/30-admitted with AMS, Fall and AKI  Assessment and Plan: [name] [name] is a 54 y.o. male presenting with altered mental status, dizziness (room spinning) and fall x2. PMH is significant for CVA in July 2016 s/p stent in [facility] with residual L hemiplegia (on ASA and Plavix) and decreased mental status at baseline. HTN, HLD and depression.  AMS/Dizziness/Fall: Likely orthostatic hypotension, as Pt was dehydrated on presentation. EKG significant for QTc prolongation. Troponin negative. Slurred speech, left sided hemiplegia is concerning for CVA. CT head negative. This morning, Pt oriented to person and time, but not to place. He knows he is in the hospital, but thinks he is currently in [place]. - Monitor mental status, neuro checks q4hr - Continue PO ASA, Plavix and Atorvastatin.  - Holding home Amantadine because can cause dizziness and confusion. Will not restart on discharge - Folate WNL, B12 high, TSH WNL, HIV negative, RPR non-reactive, sed rate elevated at 77. - Blood and urine cultures NG x 2 days. Will continue to hold off on antibiotics, as Pt does not appear sick. - Neurology consulted, appreciate recommendations. They believe his AMS is secondary to metabolic issues due to renal insufficiency. Would not recommend further workup at this time and have signed off. - PT recommending SNF with 24 hour supervision. - OT recommending SNF.   AKI: Cr 4.47 on presentation. Has been trending down to 1.44 today. Baseline appears 0.8-0.9. FENA 1% on admission, suggestive for prerenal insult.  - Had good PO intake throughout the day yesterday (took in 1.3L fluids) so will not restart  IVFs at this time. - Will monitor with daily BMPs.   Hypoglycemia: Pt has T2DM. Pt was started on D5NS on admission, but this was stopped to see how Pt does with PO intake. All of his meals are not being recorded but he has eaten 45% of breakfast yesterday and 85% of dinner on 10/31. CBGs ranging from 66-103 in the last 24 hours.  - Will continue to monitor with CBGs q4hrs   Hypernatremia, resolved: Na to 146 on presentation, trended down to 140 today. Urine sodium of 96. Likely from renal fluid loss secondary to diuretics (HCTZ 25mg  qd at home). On admission, was calculated to have a free water deficit of 3L.  - Will monitor with daily BMPs.  QT Prolongation: QTc 578 on 10/30 and 579 on 10/31. Pt has been on Clarithromycin x 2 weeks for H. Pylori treatment.  - Clarithromycin has been stopped, as he has received triple therapy for two weeks, which is recommended.  - Avoid QT prolonging drugs  Hx of CVA: Occurred in June 2016. Had a stent placed in the R MCA. Pt with residual L hemiplegia. On ASA and Plavix. CT head negative for acute intracranial process. - Continue ASA and Plavix  HTN: BPs ranging from 126-157/56-92 in the last 24 hours. Home regimen: Amlodipine 10mg  qd, Hydralazine 10mg  q8hrs, HCTZ 25mg  qd, Lisinopril 40mg  qd, Minoxidil 5mg  bid, Clonidine 0.1mg  bid.  - Currently on Norvasc 10mg  qd. Do not want to start Lisinopril or HCTZ at this time, given his AKI. Will restart Hydralazine 10mg  q8hrs today. - Continue to monitor  HLD: -Continue home Lipitor qd, Fenofibrate qd  Depression:  Pt experienced depression after stroke. - Continue home med: Prozac 20mg  daily.   FEN/GI: - Renal diet - Has had good PO intake, so not currently on IVFs.  PPX: SCDs given history of recent significant GI bleed. On ASA and Plavix for stent.  Disposition: Pending improvement in mental status.  Came from ALF ([place]), but family would like him to go elsewhere. Social work  following.  Subjective:  Pt had an unwitnessed fall last night around 0200 while trying to get up to use the bathroom. He was assessed and didn't appear to have hit his head. He had no signs of abrasions or ecchymoses to his head. He stated he did not hit his head. He was awake and responsive, with some confusion but we think this may be his baseline. No imaging was performed. I watched the videotape of his fall and he did not hit his head. He fell backwards and caught himself on the bed and fell somewhat softly onto his bottom. This morning, he states he is doing fine. He has no headache or pain in his legs or bottom. He has no concerns and would like to go home today.  Objective: Temp:  [98.1 F (36.7 C)-99.1 F (37.3 C)] 98.4 F (36.9 C) (11/02 0603) Pulse Rate:  [64-114] 74 (11/02 0603) Resp:  [14-22] 16 (11/02 0603) BP: (126-157)/(56-94) 150/84 mmHg (11/02 0603) SpO2:  [96 %-100 %] 96 % (11/02 0603) Physical Exam: Gen: Sitting up in chair in the nurses' station, appear comfortable, in NAD  HEENT: [place]/AT, no abrasions or ecchymoses noted on his head or face, PERRLA, EOMI, MMM CV: RRR, no murmurs. Resp: Normal WOB, lungs CTAB. Abd: +BS. Soft, NDNT, no rebound or guarding.  Ext: No edema or gross deformities. Neuro: Awake, alert. Oriented to person and time, but not place. Knows he is in the hospital, but thinks he is currently in [place]. 4/5 muscle strength in LLE and LUE. 5/5 muscle strength in RLE and RUE. Tremor noted in LUE.   Laboratory:  Recent Labs Lab 09/07/15 0935 09/08/15 0710 09/09/15 0805  WBC 5.1 5.8 6.0  HGB 9.1* 8.7* 9.2*  HCT 29.2* 27.9* 28.7*  PLT 229 202 181    Recent Labs Lab 09/07/15 0935  09/08/15 0710 09/08/15 1230 09/09/15 0805  NA 146*  --  147* 145 140  K 4.0  --  3.6 3.4* 3.2*  CL 105  --  113* 106 105  CO2 27  --  24 24 24   BUN 44*  --  32* 28* 18  CREATININE 4.47*  < > 2.38* 2.24* 1.50*  CALCIUM 10.0  --  9.6 9.5 9.4  PROT 7.8  --   --   7.0  --   BILITOT 0.5  --   --  0.6  --   ALKPHOS 133*  --   --  130*  --   ALT 7*  --   --  8*  --   AST 16  --   --  22  --   GLUCOSE 93  --  105* 94 87  < > = values in this interval not displayed.  Imaging/Diagnostic Tests: CT head (10/30): No acute intracranial process  Sela Hua, MD 09/10/2015, 6:52 AM PGY-1, [place] Intern pager: [phone], text pages welcome

## 2015-09-10 NOTE — Care Management Important Message (Signed)
Important Message  Patient Details  Name: [name] [name] MRN: [id number] Date of Birth: February 05, 1961   Medicare Important Message Given:  Yes-second notification given    Adron Bene, RN 09/10/2015, 4:41 PM

## 2015-09-10 NOTE — Progress Notes (Signed)
Hypoglycemic Event  CBG: 66  Treatment: 15 GM carbohydrate snack  Symptoms: None  Follow-up CBG: Time:00:43 CBG Result:101  Possible Reasons for Event: Inadequate meal intake  Comments/MD notified:MD notified    Leandro Reasoner

## 2015-09-10 NOTE — Progress Notes (Signed)
FPTS Interim Progress Note  S: Got a call by nursing due to the patient experiencing a fall. I discussed with them the scenario in which this occurred. According to the nursing staff patient had an unwitnessed fall while trying to get up to use the restroom. Bed alarm at that time was not on. Nursing staff checked patient for head trauma at the time of the fall. There is no evidence. Patient's altered mental status makes assessment of any changes difficult.  At the time of my assessment patient was lying in bed. Bed alarm was on. He was awake and responsive to my questions. Had no complaints of pain. States that he did not hit his head. Denied headache. Denied vision changes or new weaknesses. He stated that he was currently very comfortable.  O: BP 126/92 mmHg  Pulse 84  Temp(Src) 98.9 F (37.2 C) (Oral)  Resp 16  Ht 6\' 2"  (1.88 m)  Wt 230 lb (104.327 kg)  SpO2 100%  Gen: Oriented 2, alert, no acute distress, no pain. Slurred speech, some altered mental status present but this is likely his baseline. HEENT: Normocephalic/atraumatic, no signs of excoriations/abrasions/erythema/ecchymoses/bony deformity. No areas of tenderness of cranium or C-spine, whatsoever. No signs of bleeding. Pupils equal and reactive to light. Extraocular muscles intact.  A/P: Unwitnessed fall - At this time I do not believe that there is any concern for trauma to the head. Because of this I do not believe that a head CT is appropriate.  - Nursing staff is actively performing the protocol required after a patient falls. I've asked that they contact me if his mental status makes any acute changes.   Fortunately patient's room was under video surveillance. UNFORTUNATELY according to the nursing staff we are unable to looked back at the recorded material to assess how the patient fell. To do this we must wait until the managing nurse is back for the daytime shift. By that time we would be outside the helpful window of  time in which a CT scan would've been appropriate. This makes the use of any surveillance video on this patient relatively worthless and a waste of a good resource.  Elberta Leatherwood, MD 09/10/2015, 2:00 AM PGY-2, [place] [facility] Service pager [phone]

## 2015-09-10 NOTE — Progress Notes (Signed)
Called patient's daughter but could not reach her. Left a message for daughter to call the floor. Morning RN updated.

## 2015-09-10 NOTE — Progress Notes (Signed)
   09/10/15 0029  What Happened  Was fall witnessed? No  Was patient injured? No  Patient found on floor  Found by Staff-comment  Stated prior activity bathroom-unassisted  Adult Fall Risk Assessment  Risk Factor Category (scoring not indicated) Fall has occurred during this admission (document High fall risk)  Patient's Fall Risk High Fall Risk (>13 points)  Adult Fall Risk Interventions  Required Bundle Interventions *See Row Information* High fall risk - low, moderate, and high requirements implemented  Additional Interventions Fall risk signage;Room near nurses station;Secure all tubes/drains  Fall with Injury Screening  Risk For Fall Injury- See Row Information  F;Nurse judgement  Intervention(s) for 2 or more risk criteria identified Gait Belt;Floor Mat  Vitals  Temp 98.1 F (36.7 C)  Temp Source Oral  BP (!) 157/56 mmHg  BP Location Left Arm  BP Method Automatic  Patient Position (if appropriate) Sitting  Pulse Rate (!) 114  Pulse Rate Source Dinamap  Resp (!) 22  Oxygen Therapy  SpO2 100 %  O2 Device Room Air  Pain Assessment  Pain Assessment No/denies pain  Neurological  Neuro (WDL) X  Level of Consciousness Alert  Orientation Level Oriented to person  Cognition Poor safety awareness  Speech Clear  R Pupil Size (mm) 3  R Pupil Shape Round  R Pupil Reaction Brisk  L Pupil Size (mm) 3  L Pupil Shape Round  L Pupil Reaction Brisk  Motor Function/Sensation Assessment Grip  Facial Symmetry Symmetrical  R Hand Grip Present;Strong  L Hand Grip Present;Weak   Neuro Symptoms Tremors

## 2015-09-10 NOTE — Progress Notes (Signed)
Hypoglycemic Event  CBG: 66  Treatment: 15 GM carbohydrate snack  Symptoms: None  Follow-up CBG: Time:0002 CBG Result:66  Possible Reasons for Event: inadequate meal intake  Comments/MD notified:Patient given additional snack. Patient finished snacks given. Will recheck blood sugar.    [name] [name], Laramie Meissner

## 2015-09-10 NOTE — Progress Notes (Signed)
Hypoglycemic Event  CBG: 67  Treatment: 15 GM carbohydrate snack  Symptoms: None  Follow-up CBG: Time:04:14 CBG Result:72  Possible Reasons for Event: Unknown  Comments/MD notified:MD on call notified.     Lucretia Field, Caitlain Tweed

## 2015-09-10 NOTE — Clinical Social Work Note (Signed)
CSW talked by phone [phone]) with patient's daughter Janann Colonel regarding discharge plans for patient and facility selection. Patient is from [facility], however daughter does not want patient to return. Ms. Jerelene Redden given bed offers and selected [facility], however after conversation with admissions director Claiborne Billings, daughter contacted and informed that patient out of Medicare days. Daughter will allow patient to return to [facility] as patient cannot go to another facility and needs Medicaid for LTC.  CSW talked with daughter about applying for Medicaid and she cited problems with family members assisting her as she lives in [place] and patient is from [place] (and must apply in his county of residence) and she has talked with family members who live in that area about this, however Medicaid application has not been made yet.  CSW strongly encouraged Ms. Jerelene Redden to get Medicaid application done.    Dasie Chancellor Givens, MSW, LCSW Licensed Clinical Social Worker [place] [phone]

## 2015-09-11 LAB — BASIC METABOLIC PANEL WITH GFR
Anion gap: 12 (ref 5–15)
BUN: 9 mg/dL (ref 6–20)
CO2: 26 mmol/L (ref 22–32)
Calcium: 9.1 mg/dL (ref 8.9–10.3)
Chloride: 102 mmol/L (ref 101–111)
Creatinine, Ser: 1.08 mg/dL (ref 0.61–1.24)
GFR calc Af Amer: >60 mL/min
GFR calc non Af Amer: >60 mL/min
Glucose, Bld: 101 mg/dL — ABNORMAL HIGH (ref 65–99)
Potassium: 2.9 mmol/L — ABNORMAL LOW (ref 3.5–5.1)
Sodium: 140 mmol/L (ref 135–145)

## 2015-09-11 LAB — GLUCOSE, CAPILLARY
GLUCOSE-CAPILLARY: 110 mg/dL — AB (ref 65–99)
Glucose-Capillary: 62 mg/dL — ABNORMAL LOW (ref 65–99)
Glucose-Capillary: 78 mg/dL (ref 65–99)
Glucose-Capillary: 85 mg/dL (ref 65–99)
Glucose-Capillary: 86 mg/dL (ref 65–99)
Glucose-Capillary: 88 mg/dL (ref 65–99)

## 2015-09-11 MED ORDER — METOPROLOL TARTRATE 12.5 MG HALF TABLET: 12.5000 mg | ORAL_TABLET | Freq: Two times a day (BID) | ORAL | Status: DC

## 2015-09-11 NOTE — Clinical Social Work Note (Signed)
CSW talked with patient's daughter [name] and she wants patient to return to [facility].  CSW will continue to follow and facilitate discharge back to SNF when medically stable.   Felica Chargois Givens, MSW, LCSW Licensed Clinical Social Worker [name] [phone]

## 2015-09-11 NOTE — Progress Notes (Signed)
Called on call for d/c order of a patient's sitter.

## 2015-09-11 NOTE — Progress Notes (Signed)
Family Medicine Teaching Service Daily Progress Note Intern Pager: [phone]  Patient name: [name] [name] Medical record number: [id number] Date of birth: [birth year] Age: 54 y.o. Gender: male  Primary Care Provider: Gildardo Cranker, DO Consultants: Neurology Code Status: Full  Pt Overview and Major Events to Date:  10/30-admitted with AMS, Fall and AKI  Assessment and Plan: [name] [name] is a 54 y.o. male presenting with altered mental status, dizziness (room spinning) and fall x2. PMH is significant for CVA in July 2016 s/p stent in [facility] with residual L hemiplegia (on ASA and Plavix) and decreased mental status at baseline. HTN, HLD and depression.  AMS/Dizziness/Fall: Likely orthostatic hypotension, as Pt was dehydrated on presentation. EKG significant for QTc prolongation. Troponin negative. Slurred speech, left sided hemiplegia is concerning for CVA. CT head negative. Per sister, she feels like his mental status is back to normal.  - Monitor mental status, neuro checks q4hr - Continue PO ASA, Plavix and Atorvastatin.  - Holding home Amantadine because can cause dizziness and confusion. Will not restart on discharge - Folate WNL, B12 high, TSH WNL, HIV negative, RPR non-reactive, sed rate elevated at 77. - Blood and urine cultures NG x 2 days.  - Neurology consulted, appreciate recommendations. They believe his AMS is secondary to metabolic issues due to renal insufficiency. Would not recommend further workup at this time and have signed off. - PT recommending SNF with 24 hour supervision. - OT recommending SNF.   AKI: Cr 4.47 on presentation. Has been trending down. Cr 1.44 yesterday. Baseline appears 0.8-0.9. FENA 1% on admission, suggestive for prerenal insult.  - PO intake in the last 24 hours was 480 cc's. Will encourage PO intake throughout the day today. - Will monitor with daily BMPs.   Hypoglycemia: Pt has T2DM. Pt was started on D5NS on admission, but this was stopped to see  how Pt does with PO intake. All of his meals are not being recorded but he has been eating most of his meals. CBGs ranging from 62-78 in the last 24 hours.  - Will continue to monitor with CBGs q4hrs .   QT Prolongation: QTc 578 on 10/30 and 579 on 10/31. Pt has been on Clarithromycin x 2 weeks for H. Pylori treatment.  - Clarithromycin has been stopped, as he has received triple therapy for two weeks, which is recommended.  - Avoid QT prolonging drugs  Hx of CVA: Occurred in June 2016. Had a stent placed in the R MCA. Pt with residual L hemiplegia. On ASA and Plavix. CT head negative for acute intracranial process. - Continue ASA and Plavix  HTN: BPs ranging from 143-167/88-90 in the last 24 hours. Home regimen: Amlodipine 10mg  qd, Hydralazine 10mg  q8hrs, HCTZ 25mg  qd, Lisinopril 40mg  qd, Minoxidil 5mg  bid, Clonidine 0.1mg  bid.  - Currently on Norvasc 10mg  qd and Hydralazine 10mg  q8hrs. Do not want to start Lisinopril or HCTZ at this time, given his AKI.  - Continue to monitor  HLD: -Continue home Lipitor qd, Fenofibrate qd  Depression: Pt experienced depression after stroke. - Continue home med: Prozac 20mg  daily.   FEN/GI: - Renal diet - Will encourage good PO intake today, so that we do not have to restart IVFs.  PPX: SCDs given history of recent significant GI bleed. On ASA and Plavix for stent.  Disposition: Pt is medically stable for discharge. Awaiting placement at SNF.  Subjective:  Pt states he is doing well this morning. No concerns. No chest pain or SOB. Sister  in the room states that he is back to his baseline mental status.   Objective: Temp:  [98.1 F (36.7 C)-99.5 F (37.5 C)] 98.1 F (36.7 C) (11/03 0411) Pulse Rate:  [53-78] 53 (11/03 0411) Resp:  [16-18] 18 (11/03 0411) BP: (143-167)/(74-90) 167/88 mmHg (11/03 0411) SpO2:  [97 %-100 %] 98 % (11/03 0411) Physical Exam: Gen: Laying in bed, sleepy, arousable.  HEENT: [place]/AT, no abrasions or ecchymoses noted  on his head or face, PERRLA, EOMI, MMM CV: RRR, no murmurs. Resp: Normal WOB, lungs CTAB. Abd: +BS. Soft, NDNT, no rebound or guarding.  Ext: No edema or gross deformities. Neuro: Awake, alert. Unable to assess orientation, as Pt is sleepy and did not want to answer questions.   Laboratory:  Recent Labs Lab 09/08/15 0710 09/09/15 0805 09/10/15 1025  WBC 5.8 6.0 5.9  HGB 8.7* 9.2* 9.1*  HCT 27.9* 28.7* 28.8*  PLT 202 181 192    Recent Labs Lab 09/07/15 0935  09/08/15 1230 09/09/15 0805 09/10/15 1025  NA 146*  < > 145 140 140  K 4.0  < > 3.4* 3.2* 3.0*  CL 105  < > 106 105 104  CO2 27  < > 24 24 26   BUN 44*  < > 28* 18 14  CREATININE 4.47*  < > 2.24* 1.50* 1.44*  CALCIUM 10.0  < > 9.5 9.4 9.1  PROT 7.8  --  7.0  --   --   BILITOT 0.5  --  0.6  --   --   ALKPHOS 133*  --  130*  --   --   ALT 7*  --  8*  --   --   AST 16  --  22  --   --   GLUCOSE 93  < > 94 87 95  < > = values in this interval not displayed.  Imaging/Diagnostic Tests: CT head (10/30): No acute intracranial process  Sela Hua, MD 09/11/2015, 7:04 AM PGY-1, [place] Intern pager: [phone], text pages welcome

## 2015-09-11 NOTE — Progress Notes (Signed)
Hypoglycemic Event  CBG: 62  Treatment: 15 GM carbohydrate snack  Symptoms: None  Follow-up CBG: Time:04:23 CBG Result:78  Possible Reasons for Event: Unknown  Comments/MD notified:Patient given snack to improve blood sugar.    Lucretia Field, Ghadeer Kastelic

## 2015-09-11 NOTE — Progress Notes (Signed)
Physical Therapy Treatment Patient Details Name: [name] [name] MRN: [id number] DOB: August 14, 1961 Today's Date: 09/11/2015    History of Present Illness Pt is a 54 y.o. male presented to ED 9/30 with GI bleed. Recently admitted for CVA with residual L hemiplegia. Pt had R MCA stroke july 2016.    PT Comments    Demonstrates good strength with transfer today. Still unstable requiring physical assist for balance and walker control. Urinary incontinence after standing as condom cath became loose and fell to the floor. Still with difficulty following simple motor tasks, requiring verbal and tactile cues to initiate and facilitate desired movement. Patient will continue to benefit from skilled physical therapy services to further improve independence with functional mobility.   Follow Up Recommendations  SNF;Supervision/Assistance - 24 hour     Equipment Recommendations  None recommended by PT    Recommendations for Other Services       Precautions / Restrictions Precautions Precautions: Fall Precaution Comments: poor historian, AMS, confusion Restrictions Weight Bearing Restrictions: No    Mobility  Bed Mobility Overal bed mobility: Needs Assistance Bed Mobility: Supine to Sit     Supine to sit: Min assist     General bed mobility comments: Min assist for patient to pull through PTs hand to achieve seated position. Cues throughout and use of rail as needed.  Transfers Overall transfer level: Needs assistance Equipment used: Rolling walker (2 wheeled) Transfers: Sit to/from Stand Sit to Stand: Min assist Stand pivot transfers: Min assist       General transfer comment: Good power-up from low bed setting, min assist for Lt hand support on RW and balance upon standing. Pt with urinary incontinence upon standing. Delayed responses with simple instructions. After clean-up, Pt performed second sit<>stand and min assist for walker control with pivotal steps to recliner. Cues for  sequencing and walker placement for safety. Demonstrates instability.  Ambulation/Gait             General Gait Details: pt limited to 5 steps to commode with maxAx2 to maintain balalnce   Stairs            Wheelchair Mobility    Modified Rankin (Stroke Patients Only)       Balance                                    Cognition Arousal/Alertness: Awake/alert Behavior During Therapy: Impulsive;Flat affect Overall Cognitive Status: Impaired/Different from baseline Area of Impairment: Following commands;Safety/judgement;Problem solving       Following Commands: Follows one step commands inconsistently;Follows one step commands with increased time Safety/Judgement: Decreased awareness of safety;Decreased awareness of deficits   Problem Solving: Slow processing;Decreased initiation;Difficulty sequencing;Requires verbal cues;Requires tactile cues      Exercises      General Comments General comments (skin integrity, edema, etc.): Urinary incontinence after standing, condom cath fell off. Rn notified. Hygiene performed with patient.      Pertinent Vitals/Pain Pain Assessment: No/denies pain    Home Living                      Prior Function            PT Goals (current goals can now be found in the care plan section) Acute Rehab PT Goals Patient Stated Goal: none stated PT Goal Formulation: Patient unable to participate in goal setting Time For Goal Achievement: 09/23/15 Potential to Achieve  Goals: Fair Progress towards PT goals: Progressing toward goals    Frequency  Min 2X/week    PT Plan Current plan remains appropriate    Co-evaluation             End of Session Equipment Utilized During Treatment: Gait belt Activity Tolerance: Other (comment) (Limited by urinary incontinence with mobility.) Patient left: in chair;with call bell/phone within reach;with chair alarm set     Time: 3005-1102 PT Time Calculation  (min) (ACUTE ONLY): 16 min  Charges:  $Therapeutic Activity: 8-22 mins                    G CodesEllouise Newer 09-25-2015, 6:12 PM Camille Bal [place], [place]

## 2015-09-12 DIAGNOSIS — R1310 Dysphagia, unspecified: Secondary | ICD-10-CM | POA: Diagnosis not present

## 2015-09-12 DIAGNOSIS — I10 Essential (primary) hypertension: Secondary | ICD-10-CM | POA: Diagnosis not present

## 2015-09-12 DIAGNOSIS — I4581 Long QT syndrome: Secondary | ICD-10-CM | POA: Diagnosis not present

## 2015-09-12 DIAGNOSIS — E860 Dehydration: Secondary | ICD-10-CM | POA: Diagnosis not present

## 2015-09-12 DIAGNOSIS — K922 Gastrointestinal hemorrhage, unspecified: Secondary | ICD-10-CM | POA: Diagnosis not present

## 2015-09-12 DIAGNOSIS — M6281 Muscle weakness (generalized): Secondary | ICD-10-CM | POA: Diagnosis not present

## 2015-09-12 DIAGNOSIS — I69154 Hemiplegia and hemiparesis following nontraumatic intracerebral hemorrhage affecting left non-dominant side: Secondary | ICD-10-CM | POA: Diagnosis not present

## 2015-09-12 DIAGNOSIS — E1149 Type 2 diabetes mellitus with other diabetic neurological complication: Secondary | ICD-10-CM | POA: Diagnosis not present

## 2015-09-12 DIAGNOSIS — R7611 Nonspecific reaction to tuberculin skin test without active tuberculosis: Secondary | ICD-10-CM | POA: Diagnosis not present

## 2015-09-12 DIAGNOSIS — J168 Pneumonia due to other specified infectious organisms: Secondary | ICD-10-CM | POA: Diagnosis not present

## 2015-09-12 DIAGNOSIS — I69354 Hemiplegia and hemiparesis following cerebral infarction affecting left non-dominant side: Secondary | ICD-10-CM | POA: Diagnosis not present

## 2015-09-12 DIAGNOSIS — F0391 Unspecified dementia with behavioral disturbance: Secondary | ICD-10-CM | POA: Diagnosis not present

## 2015-09-12 DIAGNOSIS — N179 Acute kidney failure, unspecified: Secondary | ICD-10-CM | POA: Diagnosis not present

## 2015-09-12 DIAGNOSIS — R300 Dysuria: Secondary | ICD-10-CM | POA: Diagnosis not present

## 2015-09-12 DIAGNOSIS — K2971 Gastritis, unspecified, with bleeding: Secondary | ICD-10-CM | POA: Diagnosis not present

## 2015-09-12 DIAGNOSIS — R4189 Other symptoms and signs involving cognitive functions and awareness: Secondary | ICD-10-CM | POA: Diagnosis not present

## 2015-09-12 DIAGNOSIS — K219 Gastro-esophageal reflux disease without esophagitis: Secondary | ICD-10-CM | POA: Diagnosis not present

## 2015-09-12 DIAGNOSIS — F339 Major depressive disorder, recurrent, unspecified: Secondary | ICD-10-CM | POA: Diagnosis not present

## 2015-09-12 DIAGNOSIS — R410 Disorientation, unspecified: Secondary | ICD-10-CM | POA: Diagnosis not present

## 2015-09-12 DIAGNOSIS — F17290 Nicotine dependence, other tobacco product, uncomplicated: Secondary | ICD-10-CM | POA: Diagnosis not present

## 2015-09-12 DIAGNOSIS — E782 Mixed hyperlipidemia: Secondary | ICD-10-CM | POA: Diagnosis not present

## 2015-09-12 DIAGNOSIS — E119 Type 2 diabetes mellitus without complications: Secondary | ICD-10-CM | POA: Diagnosis not present

## 2015-09-12 DIAGNOSIS — I63411 Cerebral infarction due to embolism of right middle cerebral artery: Secondary | ICD-10-CM | POA: Diagnosis not present

## 2015-09-12 DIAGNOSIS — F09 Unspecified mental disorder due to known physiological condition: Secondary | ICD-10-CM | POA: Diagnosis not present

## 2015-09-12 LAB — CULTURE, BLOOD (ROUTINE X 2)
CULTURE: NO GROWTH
Culture: NO GROWTH

## 2015-09-12 LAB — GLUCOSE, CAPILLARY
GLUCOSE-CAPILLARY: 105 mg/dL — AB (ref 65–99)
GLUCOSE-CAPILLARY: 90 mg/dL (ref 65–99)
GLUCOSE-CAPILLARY: 95 mg/dL (ref 65–99)
Glucose-Capillary: 78 mg/dL (ref 65–99)
Glucose-Capillary: 90 mg/dL (ref 65–99)
Glucose-Capillary: 99 mg/dL (ref 65–99)

## 2015-09-12 MED ORDER — POTASSIUM CHLORIDE CRYS ER 20 MEQ PO TBCR
40.0000 meq | EXTENDED_RELEASE_TABLET | Freq: Two times a day (BID) | ORAL | Status: AC
  Administered 2015-09-12 (×2): 40 meq via ORAL
  Filled 2015-09-12 (×2): qty 2

## 2015-09-12 MED ORDER — DONEPEZIL HCL 5 MG PO TABS: 5.0000 mg | ORAL_TABLET | Freq: Every day | ORAL | Status: DC

## 2015-09-12 MED ORDER — LISINOPRIL 20 MG PO TABS
20.0000 mg | ORAL_TABLET | Freq: Every day | ORAL | Status: DC
  Administered 2015-09-12: 20 mg via ORAL
  Filled 2015-09-12: qty 1

## 2015-09-12 MED ORDER — LISINOPRIL 40 MG PO TABS: 20.0000 mg | ORAL_TABLET | Freq: Every day | ORAL | Status: DC

## 2015-09-12 NOTE — Discharge Instructions (Signed)
You were hospitalized because you were dizzy and seemed confused. We wanted to make sure that nothing serious was going on. Your kidneys were injured, probably from dehydration, so we gave you fluids. We also did some other labs and imaging studies, which were normal. Please try to drink plenty of fluids throughout the day to keep yourself hydrated, even if you are not thirsty.   1. Please stop taking the Amantadine, because we think this contributed to your dizziness and confusion. 2. Please stop taking the Minoxidil for blood pressure.  3. Please call your primary care physician to schedule a hospital follow-up visit in the next 2 weeks.

## 2015-09-12 NOTE — Progress Notes (Signed)
Called and gave report to nurse at [facility]

## 2015-09-12 NOTE — Care Management Note (Signed)
Case Management Note  Patient Details  Name: [name] [name] MRN: [id number] Date of Birth: Oct 22, 1961  Subjective/Objective:                 CM following for progression and d/c planning.   Action/Plan: Pt is SNF resident and will return to [facility].  Expected Discharge Date:    09/12/2015              Expected Discharge Plan:  Skilled Nursing Facility  In-House Referral:  Clinical Social Work  Discharge planning Services  NA  Post Acute Care Choice:  NA Choice offered to:  NA  DME Arranged:    DME Agency:     HH Arranged:    [place] Agency:     Status of Service:  Completed, signed off  Medicare Important Message Given:  Yes-second notification given Date Medicare IM Given:    Medicare IM give by:    Date Additional Medicare IM Given:    Additional Medicare Important Message give by:     If discussed at [place] of Stay Meetings, dates discussed:    Additional Comments:  Adron Bene, RN 09/12/2015, 4:41 PM

## 2015-09-12 NOTE — Progress Notes (Signed)
Family Medicine Teaching Service Daily Progress Note Intern Pager: [phone]  Patient name: [name] [name] Medical record number: [id number] Date of birth: April 17, 1961 Age: 54 y.o. Gender: male  Primary Care Provider: Gildardo Cranker, DO Consultants: Neurology Code Status: Full  Pt Overview and Major Events to Date:  10/30-admitted with AMS, Fall and AKI  Assessment and Plan: [name] [name] is a 54 y.o. male presenting with altered mental status, dizziness (room spinning) and fall x2. PMH is significant for CVA in July 2016 s/p stent in [facility] with residual L hemiplegia (on ASA and Plavix) and decreased mental status at baseline. HTN, HLD and depression.  AMS/Dizziness/Fall: Likely orthostatic hypotension, as Pt was dehydrated on presentation. EKG significant for QTc prolongation. Troponin negative. Slurred speech, left sided hemiplegia is concerning for CVA. CT head negative. Per sister, she feels like his mental status is back to normal.  - Monitor mental status, neuro checks q4hr - Continue PO ASA, Plavix and Atorvastatin.  - Holding home Amantadine because can cause dizziness and confusion. Will not restart on discharge. - Folate WNL, B12 high, TSH WNL, HIV negative, RPR non-reactive, sed rate elevated at 77. - Blood and urine cultures NGTD.  - Neurology consulted, appreciate recommendations. They believe his AMS is secondary to metabolic issues due to renal insufficiency. Would not recommend further workup at this time and have signed off. - PT recommending SNF with 24 hour supervision. - OT recommending SNF.   Hypokalemia: K+ 2.9 today. - Will replace with K-Dur 24mEq x 2 today.  AKI, resolved: Cr 4.47 on presentation. Has been trending down. Cr 1.08 yesterday. Baseline appears 0.8-0.9. FENA 1% on admission, suggestive for prerenal insult.  - PO intake in the last 24 hours was 750cc's.  - Encouraged PO intake.  Hypoglycemia: Pt has T2DM. CBGs ranging from 78-105 in the last 24 hours.   - Will continue to monitor with CBGs q4hrs .   QT Prolongation: QTc 578 on 10/30 and 579 on 10/31. Pt has been on Clarithromycin x 2 weeks for H. Pylori treatment.  - Clarithromycin has been stopped, as he has received triple therapy for two weeks, which is recommended.  - Avoid QT prolonging drugs  Hx of CVA: Occurred in June 2016. Had a stent placed in the R MCA. Pt with residual L hemiplegia. On ASA and Plavix. CT head negative for acute intracranial process. - Continue ASA and Plavix  HTN: BPs ranging from 144-156/81-84 in the last 24 hours. Home regimen: Amlodipine 10mg  qd, Hydralazine 10mg  q8hrs, HCTZ 25mg  qd, Lisinopril 40mg  qd, Minoxidil 5mg  bid, Clonidine 0.1mg  bid.  - Currently on Norvasc 10mg  qd and Hydralazine 10mg  q8hrs. Will restart Lisinopril 20mg  today (home dose is 40mg  qd), as creatinine has returned back to baseline. - Continue to monitor  HLD: -Continue home Lipitor qd, Fenofibrate qd  Depression: Pt experienced depression after stroke. - Continue home med: Prozac 20mg  daily.   FEN/GI: - Renal diet  PPX: SCDs given history of recent significant GI bleed. On ASA and Plavix for stent.  Disposition: Back to [facility] today.  Subjective:  Pt states he is doing well this morning. He feels back to normal. He is not having any chest pain or SOB. He states he is ready to go back to [facility] today.  Objective: Temp:  [98.2 F (36.8 C)-98.9 F (37.2 C)] 98.4 F (36.9 C) (11/04 0510) Pulse Rate:  [53-95] 62 (11/04 0510) Resp:  [16-18] 16 (11/04 0510) BP: (144-156)/(81-86) 156/81 mmHg (11/04  0510) SpO2:  [97 %-100 %] 100 % (11/04 0510) Physical Exam: Gen: Laying in bed, alert, well-appearing, in NAD.  HEENT: [place]/AT, EOMI, MMM CV: RRR, no murmurs. Resp: Normal WOB, lungs CTAB. Abd: +BS. Soft, NDNT, no rebound or guarding.  Ext: No edema or gross deformities. Neuro: Awake, alert. Oriented to person, place, and time; able to follow  commands.  Laboratory:  Recent Labs Lab 09/08/15 0710 09/09/15 0805 09/10/15 1025  WBC 5.8 6.0 5.9  HGB 8.7* 9.2* 9.1*  HCT 27.9* 28.7* 28.8*  PLT 202 181 192    Recent Labs Lab 09/07/15 0935  09/08/15 1230 09/09/15 0805 09/10/15 1025 09/11/15 0840  NA 146*  < > 145 140 140 140  K 4.0  < > 3.4* 3.2* 3.0* 2.9*  CL 105  < > 106 105 104 102  CO2 27  < > 24 24 26 26   BUN 44*  < > 28* 18 14 9   CREATININE 4.47*  < > 2.24* 1.50* 1.44* 1.08  CALCIUM 10.0  < > 9.5 9.4 9.1 9.1  PROT 7.8  --  7.0  --   --   --   BILITOT 0.5  --  0.6  --   --   --   ALKPHOS 133*  --  130*  --   --   --   ALT 7*  --  8*  --   --   --   AST 16  --  22  --   --   --   GLUCOSE 93  < > 94 87 95 101*  < > = values in this interval not displayed.  Imaging/Diagnostic Tests: CT head (10/30): No acute intracranial process  Sela Hua, MD 09/12/2015, 7:20 AM PGY-1, [place] Intern pager: [phone], text pages welcome

## 2015-09-12 NOTE — Clinical Social Work Placement (Signed)
   CLINICAL SOCIAL WORK PLACEMENT  NOTE 09/12/15 - DISCHARGED TO [facility] [place])  Date:  09/12/2015  Patient Details  Name: [name] [name] MRN: [id number] Date of Birth: July 09, 1961  Clinical Social Work is seeking post-discharge placement for this patient at the [place] level of care (*CSW will initial, date and re-position this form in  chart as items are completed):  Yes   Patient/family provided with [place] Work Department's list of facilities offering this level of care within the geographic area requested by the patient (or if unable, by the patient's family).  Yes   Patient/family informed of their freedom to choose among providers that offer the needed level of care, that participate in Medicare, Medicaid or managed care program needed by the patient, have an available bed and are willing to accept the patient.  Yes   Patient/family informed of [facility]'s ownership interest in [facility] and [facility], as well as of the fact that they are under no obligation to receive care at these facilities.  PASRR submitted to EDS on       PASRR number received on       Existing PASRR number confirmed on 09/08/15     FL2 transmitted to all facilities in geographic area requested by pt/family on  09/08/15     FL2 transmitted to all facilities within larger geographic area on       Patient informed that his/her managed care company has contracts with or will negotiate with certain facilities, including the following:         YES - Patient/family informed of bed offers received.  Patient/family chooses for patient to return to [facility] and [place] recommends and patient chooses bed at      Patient to be transferred to  [facility] [place] on  09/12/15.  Patient to be transferred to facility by  ambulance     Patient family notified on   09/12/15 of transfer.  Name of family member notified:   Janann Colonel, daughter by phone     PHYSICIAN       Additional Comment:    _______________________________________________ Sable Feil, LCSW 09/12/2015, 6:33 PM

## 2015-09-15 ENCOUNTER — Other Ambulatory Visit: Payer: Self-pay | Admitting: *Deleted

## 2015-09-15 MED ORDER — HYDROCODONE-ACETAMINOPHEN 5-325 MG PO TABS: ORAL_TABLET | ORAL | Status: DC

## 2015-09-15 NOTE — Telephone Encounter (Signed)
[facility] 

## 2015-09-16 ENCOUNTER — Non-Acute Institutional Stay (SKILLED_NURSING_FACILITY): Payer: Medicare Other | Admitting: Internal Medicine

## 2015-09-16 DIAGNOSIS — E1149 Type 2 diabetes mellitus with other diabetic neurological complication: Secondary | ICD-10-CM | POA: Diagnosis not present

## 2015-09-16 DIAGNOSIS — I69354 Hemiplegia and hemiparesis following cerebral infarction affecting left non-dominant side: Secondary | ICD-10-CM | POA: Diagnosis not present

## 2015-09-16 DIAGNOSIS — R300 Dysuria: Secondary | ICD-10-CM | POA: Diagnosis not present

## 2015-09-16 DIAGNOSIS — I10 Essential (primary) hypertension: Secondary | ICD-10-CM | POA: Diagnosis not present

## 2015-09-16 DIAGNOSIS — R7611 Nonspecific reaction to tuberculin skin test without active tuberculosis: Secondary | ICD-10-CM

## 2015-09-18 ENCOUNTER — Encounter: Payer: Self-pay | Admitting: Internal Medicine

## 2015-09-18 NOTE — Progress Notes (Signed)
Patient ID: [name] [name], male   DOB: 1961/01/07, 55 y.o.   MRN: [id number]    HISTORY AND PHYSICAL   DATE: 09/16/15  Location:  [facility]    Place of Service: SNF [phone])   Extended Emergency Contact Information Primary Emergency Contact: [place] Mobile Phone: [phone] Relation: Daughter Secondary Emergency Contact: Line,Joshiah  [place] Phone: [phone] Relation: Other  Advanced Directive information   FULL CODE  Chief Complaint  Patient presents with  . Readmit To SNF    HPI:  54 yo male seen today as a readmission into SNF following hospital stay for UTI. He has hx CVA with left hemiparesis and expressive aphasia. Cr 4.47-->0.9. Na improved from 146-->140. QTc prolonged to 578 thought to be due to clarithromycin he was taking for H pylori infection.  Head CT showed no acute process. aricept started due to c/a reduced cognition. BP meds adjusted and he was d/c'd on amlodipine, hydralazine and lisinopril. CBGs 80-90s  Nursing c/a right forearm PPD site that appears to be (+). Pt requests rx for pain med. He still has stinging sensation with urination but no f/c or abdominal pain. No hematuria. No falls since readmission. He is a poor historian due to expressive aphasia. Hx obtained from chart  Past Medical History  Diagnosis Date  . Stroke ([place])   . Hypertension   . Hyperlipidemia   . Diabetes mellitus without complication ([place])   . Fall at home   . Hemiparesis affecting left side as late effect of stroke ([place])   . Dysphagia as late effect of cerebrovascular disease   . Acute respiratory failure ([place])   . Cerebral edema [facility])     Past Surgical History  Procedure Laterality Date  . Gastrostomy tube placement  05-19-15  . Mechanical thrombectomy angioplasty stenet placement    . Esophagogastroduodenoscopy N/A 08/12/2015    Procedure: ESOPHAGOGASTRODUODENOSCOPY (EGD);  Surgeon: Ladene Artist, MD;  Location: [facility] ENDOSCOPY;  Service: Endoscopy;  Laterality: N/A;    Patient Care Team: Gildardo Cranker, DO as PCP - General (Internal Medicine)  Social History   Social History  . Marital Status: Single    Spouse Name: N/A  . Number of Children: N/A  . Years of Education: N/A   Occupational History  . Not on file.   Social History Main Topics  . Smoking status: Former Smoker    Types: Cigarettes  . Smokeless tobacco: Not on file  . Alcohol Use: No  . Drug Use: Not on file  . Sexual Activity: Not on file   Other Topics Concern  . Not on file   Social History Narrative     reports that he has quit smoking. His smoking use included Cigarettes. He does not have any smokeless tobacco history on file. He reports that he does not drink alcohol. His drug history is not on file.  Family History  Problem Relation Age of Onset  . Family history unknown: Yes   No family status information on file.    Immunization History  Administered Date(s) Administered  . Influenza,inj,Quad PF,36+ Mos 08/10/2015    No Known Allergies  Medications: Patient's Medications  New Prescriptions   No medications on file  Previous Medications   AMLODIPINE (NORVASC) 10 MG TABLET    Take 10 mg by mouth daily.    ASPIRIN 325 MG TABLET    Take 1 tablet (325 mg total) by mouth daily.   ATORVASTATIN (  LIPITOR) 80 MG TABLET    Take 1 tablet (80 mg total) by mouth daily at 6 PM.   CLOPIDOGREL (PLAVIX) 75 MG TABLET    75 mg by Gastrostomy Tube route daily.    DONEPEZIL (ARICEPT) 5 MG TABLET    Take 1 tablet (5 mg total) by mouth at bedtime.   FEEDING SUPPLEMENT, ENSURE ENLIVE, (ENSURE ENLIVE) LIQD    Take 237 mLs by mouth 2 (two) times daily between meals.   FENOFIBRATE 40 MG TABS    Take 40 mg by mouth 2 (two) times daily.   FLUOXETINE (PROZAC) 20 MG CAPSULE    20 mg by Gastrostomy Tube route daily.    HYDRALAZINE (APRESOLINE) 10 MG TABLET    Take 10 mg by mouth every 8 (eight) hours.     HYDROCODONE-ACETAMINOPHEN (NORCO/VICODIN) 5-325 MG TABLET    Take one tablet by mouth every 6 hours as needed for pain. Not to exceed $RemoveB'3000mg'hYOlNdEc$  of APAP from all sources/24h   LISINOPRIL (PRINIVIL,ZESTRIL) 40 MG TABLET    Take 0.5 tablets (20 mg total) by mouth daily. Until seen by PCP.   MALTODEXTRIN-XANTHAN GUM (RESOURCE THICKENUP CLEAR) POWD    Take 3,600 g by mouth as needed.   MULTIPLE VITAMINS TABLET    Take 1 tablet by mouth daily.   PANTOPRAZOLE (PROTONIX) 40 MG TABLET    Take 1 tablet (40 mg total) by mouth 2 (two) times daily.   SENNOSIDES-DOCUSATE SODIUM (SENOKOT-S) 8.6-50 MG TABLET    Take 1 tablet by mouth daily.   Modified Medications   No medications on file  Discontinued Medications   No medications on file    Review of Systems  Unable to perform ROS: Other  expressive aphasia  Filed Vitals:   09/16/15 2042  BP: 98/58  Pulse: 59  Temp: 98.9 F (37.2 C)  Weight: 209 lb (94.802 kg)   Body mass index is 26.82 kg/(m^2).  Physical Exam  Constitutional: He appears well-developed.  Frail appearing sitting in chair in NAD  HENT:  Mouth/Throat: Oropharynx is clear and moist.  Eyes: Pupils are equal, round, and reactive to light. No scleral icterus.  Neck: Neck supple. Carotid bruit is not present. No thyromegaly present.  Cardiovascular: Normal rate, regular rhythm, normal heart sounds and intact distal pulses.  Exam reveals no gallop and no friction rub.   No murmur heard. no distal LE swelling. No calf TTP  Pulmonary/Chest: Effort normal and breath sounds normal. He has no wheezes. He has no rales. He exhibits no tenderness.  Abdominal: Soft. Bowel sounds are normal. He exhibits no distension, no abdominal bruit, no pulsatile midline mass and no mass. There is no tenderness. There is no rebound and no guarding.  Musculoskeletal: He exhibits edema.  Right sided swelling  Lymphadenopathy:    He has no cervical adenopathy.  Neurological: He is alert.  Left hemiparesis    Skin: Skin is warm and dry. No rash noted.  Large right forearm induration, redness but NT  Psychiatric: He has a normal mood and affect. His behavior is normal.     Labs reviewed: Admission on 09/07/2015, Discharged on 09/12/2015  No results displayed because visit has over 200 results.    Admission on 08/08/2015, Discharged on 08/14/2015  No results displayed because visit has over 200 results.    Admission on 07/23/2015, Discharged on 07/24/2015  Component Date Value Ref Range Status  . WBC 07/23/2015 10.7* 4.0 - 10.5 K/uL Final  . RBC 07/23/2015 3.51* 4.22 - 5.81  MIL/uL Final  . Hemoglobin 07/23/2015 10.2* 13.0 - 17.0 g/dL Final  . HCT 07/23/2015 29.8* 39.0 - 52.0 % Final  . MCV 07/23/2015 84.9  78.0 - 100.0 fL Final  . MCH 07/23/2015 29.1  26.0 - 34.0 pg Final  . MCHC 07/23/2015 34.2  30.0 - 36.0 g/dL Final  . RDW 07/23/2015 13.9  11.5 - 15.5 % Final  . Platelets 07/23/2015 141* 150 - 400 K/uL Final  . Neutrophils Relative % 07/23/2015 79   Final  . Neutro Abs 07/23/2015 8.5* 1.7 - 7.7 K/uL Final  . Lymphocytes Relative 07/23/2015 9   Final  . Lymphs Abs 07/23/2015 1.0  0.7 - 4.0 K/uL Final  . Monocytes Relative 07/23/2015 11   Final  . Monocytes Absolute 07/23/2015 1.2* 0.1 - 1.0 K/uL Final  . Eosinophils Relative 07/23/2015 1   Final  . Eosinophils Absolute 07/23/2015 0.1  0.0 - 0.7 K/uL Final  . Basophils Relative 07/23/2015 0   Final  . Basophils Absolute 07/23/2015 0.0  0.0 - 0.1 K/uL Final  . Sodium 07/23/2015 137  135 - 145 mmol/L Final  . Potassium 07/23/2015 3.7  3.5 - 5.1 mmol/L Final  . Chloride 07/23/2015 104  101 - 111 mmol/L Final  . CO2 07/23/2015 19* 22 - 32 mmol/L Final  . Glucose, Bld 07/23/2015 130* 65 - 99 mg/dL Final  . BUN 07/23/2015 42* 6 - 20 mg/dL Final  . Creatinine, Ser 07/23/2015 3.11* 0.61 - 1.24 mg/dL Final  . Calcium 07/23/2015 9.3  8.9 - 10.3 mg/dL Final  . GFR calc non Af Amer 07/23/2015 21* >60 mL/min Final  . GFR calc Af Amer  07/23/2015 24* >60 mL/min Final   Comment: (NOTE) The eGFR has been calculated using the CKD EPI equation. This calculation has not been validated in all clinical situations. eGFR's persistently <60 mL/min signify possible Chronic Kidney Disease.   . Anion gap 07/23/2015 14  5 - 15 Final  . pH, Ven 07/23/2015 7.441* 7.250 - 7.300 Final  . pCO2, Ven 07/23/2015 28.6* 45.0 - 50.0 mmHg Final  . pO2, Ven 07/23/2015 78.0* 30.0 - 45.0 mmHg Final  . Bicarbonate 07/23/2015 19.5* 20.0 - 24.0 mEq/L Final  . TCO2 07/23/2015 20  0 - 100 mmol/L Final  . O2 Saturation 07/23/2015 96.0   Final  . Acid-base deficit 07/23/2015 4.0* 0.0 - 2.0 mmol/L Final  . Sample type 07/23/2015 VENOUS   Final    Dg Chest 2 View  09/07/2015  CLINICAL DATA:  altered from norm. Pt normally wheels self to bathroom, has hx of CVA, also has dark urine per staff EXAM: CHEST - 2 VIEW COMPARISON:  08/08/2015 FINDINGS: Relatively low lung volumes with some subsegmental atelectasis at the right lung base, improved since previous. Heart size upper limits normal. Mildly tortuous thoracic aorta. No pneumothorax. No effusion. Mild spurring in the lower thoracic spine. IMPRESSION: 1. Improving right lower lobe atelectasis or infiltrate. No acute or superimposed abnormality. Electronically Signed   By: Lucrezia Europe M.D.   On: 09/07/2015 09:43   Ct Head Wo Contrast  09/07/2015  CLINICAL DATA:  ALTERED MENTAL STATUS, NURSING FACILITY GIVES HX OF 2 FALLS YESTERDAY, LESS ORIENTED TODAY, HX DIABETES, HTN, CVA WITH LEFT SIDE HEMIPARESIS, DYSPHASIA, RECENT HOSPITALIZATION FOR GI BLEED, PLAVIX RX EXAM: CT HEAD WITHOUT CONTRAST TECHNIQUE: Contiguous axial images were obtained from the base of the skull through the vertex without intravenous contrast. Continued patient motion required rescanning x2. COMPARISON:  08/08/2015 FINDINGS: Right MCA stent.  Atherosclerotic and physiologic intracranial calcifications. Old right MCA distribution infarct with  encephalomalacia. No acute intracranial hemorrhage, midline shift, new parenchymal edema, mass or mass effect. There is moderate atrophy with prominence of ventricles and sulci which remain relatively symmetric. Acute infarct may be inapparent on noncontrast CT. CT bone windows reveal no calvarial lesion. IMPRESSION: 1. Negative for bleed or other acute intracranial process. 2. Stable old right MCA distribution infarct without hemorrhagic change. Electronically Signed   By: Lucrezia Europe M.D.   On: 09/07/2015 09:47     Assessment/Plan   ICD-9-CM ICD-10-CM   1. Dysuria 788.1 R30.0   2. Positive PPD 795.51 R76.11   3. Hemiparesis affecting left side as late effect of stroke (HCC) 438.20 I69.354   4. Type II diabetes mellitus with neurological manifestations ([place]) 250.60 E11.49   5. Essential hypertension 401.9 I10     Check CXR for (+) PPD  Check UA to r/o UTI  Rx norco 5/325 #90 take 1 q6hrs prn pain.  Cont other meds as ordered  Cont PT/OT/ST as ordered  GOAL: short term rehab and d/c home when medically appropriate. Communicated with pt and nursing.  Will follow  Jacinta Penalver S. Perlie Gold  [facility] [address] [place], [place] [address] [phone] Cell (Monday-Friday 8 AM - 5 PM) [phone] After 5 PM and follow prompts

## 2015-09-19 ENCOUNTER — Non-Acute Institutional Stay (SKILLED_NURSING_FACILITY): Payer: Medicare Other | Admitting: Adult Health

## 2015-09-19 DIAGNOSIS — J90 Pleural effusion, not elsewhere classified: Secondary | ICD-10-CM | POA: Diagnosis not present

## 2015-09-19 DIAGNOSIS — R2681 Unsteadiness on feet: Secondary | ICD-10-CM | POA: Diagnosis not present

## 2015-09-19 DIAGNOSIS — R1312 Dysphagia, oropharyngeal phase: Secondary | ICD-10-CM | POA: Diagnosis not present

## 2015-09-19 DIAGNOSIS — I10 Essential (primary) hypertension: Secondary | ICD-10-CM

## 2015-09-19 DIAGNOSIS — R41841 Cognitive communication deficit: Secondary | ICD-10-CM | POA: Diagnosis not present

## 2015-09-19 DIAGNOSIS — J168 Pneumonia due to other specified infectious organisms: Secondary | ICD-10-CM | POA: Diagnosis not present

## 2015-09-19 DIAGNOSIS — F802 Mixed receptive-expressive language disorder: Secondary | ICD-10-CM | POA: Diagnosis not present

## 2015-09-19 DIAGNOSIS — R0689 Other abnormalities of breathing: Secondary | ICD-10-CM | POA: Diagnosis not present

## 2015-09-19 DIAGNOSIS — Y95 Nosocomial condition: Secondary | ICD-10-CM

## 2015-09-19 DIAGNOSIS — I69154 Hemiplegia and hemiparesis following nontraumatic intracerebral hemorrhage affecting left non-dominant side: Secondary | ICD-10-CM | POA: Diagnosis not present

## 2015-09-19 DIAGNOSIS — J189 Pneumonia, unspecified organism: Secondary | ICD-10-CM

## 2015-09-19 DIAGNOSIS — M6281 Muscle weakness (generalized): Secondary | ICD-10-CM | POA: Diagnosis not present

## 2015-09-19 DIAGNOSIS — R05 Cough: Secondary | ICD-10-CM | POA: Diagnosis not present

## 2015-09-22 DIAGNOSIS — R1312 Dysphagia, oropharyngeal phase: Secondary | ICD-10-CM | POA: Diagnosis not present

## 2015-09-22 DIAGNOSIS — F802 Mixed receptive-expressive language disorder: Secondary | ICD-10-CM | POA: Diagnosis not present

## 2015-09-22 DIAGNOSIS — R0989 Other specified symptoms and signs involving the circulatory and respiratory systems: Secondary | ICD-10-CM | POA: Diagnosis not present

## 2015-09-22 DIAGNOSIS — R2681 Unsteadiness on feet: Secondary | ICD-10-CM | POA: Diagnosis not present

## 2015-09-22 DIAGNOSIS — N179 Acute kidney failure, unspecified: Secondary | ICD-10-CM | POA: Diagnosis not present

## 2015-09-22 DIAGNOSIS — I69154 Hemiplegia and hemiparesis following nontraumatic intracerebral hemorrhage affecting left non-dominant side: Secondary | ICD-10-CM | POA: Diagnosis not present

## 2015-09-22 DIAGNOSIS — M6281 Muscle weakness (generalized): Secondary | ICD-10-CM | POA: Diagnosis not present

## 2015-09-22 DIAGNOSIS — J168 Pneumonia due to other specified infectious organisms: Secondary | ICD-10-CM | POA: Diagnosis not present

## 2015-09-22 DIAGNOSIS — R05 Cough: Secondary | ICD-10-CM | POA: Diagnosis not present

## 2015-09-23 DIAGNOSIS — I69154 Hemiplegia and hemiparesis following nontraumatic intracerebral hemorrhage affecting left non-dominant side: Secondary | ICD-10-CM | POA: Diagnosis not present

## 2015-09-23 DIAGNOSIS — R1312 Dysphagia, oropharyngeal phase: Secondary | ICD-10-CM | POA: Diagnosis not present

## 2015-09-23 DIAGNOSIS — R2681 Unsteadiness on feet: Secondary | ICD-10-CM | POA: Diagnosis not present

## 2015-09-23 DIAGNOSIS — F802 Mixed receptive-expressive language disorder: Secondary | ICD-10-CM | POA: Diagnosis not present

## 2015-09-23 DIAGNOSIS — J168 Pneumonia due to other specified infectious organisms: Secondary | ICD-10-CM | POA: Diagnosis not present

## 2015-09-23 DIAGNOSIS — M6281 Muscle weakness (generalized): Secondary | ICD-10-CM | POA: Diagnosis not present

## 2015-09-24 DIAGNOSIS — I69154 Hemiplegia and hemiparesis following nontraumatic intracerebral hemorrhage affecting left non-dominant side: Secondary | ICD-10-CM | POA: Diagnosis not present

## 2015-09-24 DIAGNOSIS — R1312 Dysphagia, oropharyngeal phase: Secondary | ICD-10-CM | POA: Diagnosis not present

## 2015-09-24 DIAGNOSIS — F802 Mixed receptive-expressive language disorder: Secondary | ICD-10-CM | POA: Diagnosis not present

## 2015-09-24 DIAGNOSIS — R2681 Unsteadiness on feet: Secondary | ICD-10-CM | POA: Diagnosis not present

## 2015-09-24 DIAGNOSIS — J168 Pneumonia due to other specified infectious organisms: Secondary | ICD-10-CM | POA: Diagnosis not present

## 2015-09-24 DIAGNOSIS — M6281 Muscle weakness (generalized): Secondary | ICD-10-CM | POA: Diagnosis not present

## 2015-09-25 DIAGNOSIS — R1312 Dysphagia, oropharyngeal phase: Secondary | ICD-10-CM | POA: Diagnosis not present

## 2015-09-25 DIAGNOSIS — I69154 Hemiplegia and hemiparesis following nontraumatic intracerebral hemorrhage affecting left non-dominant side: Secondary | ICD-10-CM | POA: Diagnosis not present

## 2015-09-25 DIAGNOSIS — F802 Mixed receptive-expressive language disorder: Secondary | ICD-10-CM | POA: Diagnosis not present

## 2015-09-25 DIAGNOSIS — R2681 Unsteadiness on feet: Secondary | ICD-10-CM | POA: Diagnosis not present

## 2015-09-25 DIAGNOSIS — M6281 Muscle weakness (generalized): Secondary | ICD-10-CM | POA: Diagnosis not present

## 2015-09-25 DIAGNOSIS — J168 Pneumonia due to other specified infectious organisms: Secondary | ICD-10-CM | POA: Diagnosis not present

## 2015-09-26 DIAGNOSIS — I69154 Hemiplegia and hemiparesis following nontraumatic intracerebral hemorrhage affecting left non-dominant side: Secondary | ICD-10-CM | POA: Diagnosis not present

## 2015-09-26 DIAGNOSIS — R1312 Dysphagia, oropharyngeal phase: Secondary | ICD-10-CM | POA: Diagnosis not present

## 2015-09-26 DIAGNOSIS — M6281 Muscle weakness (generalized): Secondary | ICD-10-CM | POA: Diagnosis not present

## 2015-09-26 DIAGNOSIS — J168 Pneumonia due to other specified infectious organisms: Secondary | ICD-10-CM | POA: Diagnosis not present

## 2015-09-26 DIAGNOSIS — R2681 Unsteadiness on feet: Secondary | ICD-10-CM | POA: Diagnosis not present

## 2015-09-26 DIAGNOSIS — F802 Mixed receptive-expressive language disorder: Secondary | ICD-10-CM | POA: Diagnosis not present

## 2015-09-28 DIAGNOSIS — J168 Pneumonia due to other specified infectious organisms: Secondary | ICD-10-CM | POA: Diagnosis not present

## 2015-09-28 DIAGNOSIS — F802 Mixed receptive-expressive language disorder: Secondary | ICD-10-CM | POA: Diagnosis not present

## 2015-09-28 DIAGNOSIS — I69154 Hemiplegia and hemiparesis following nontraumatic intracerebral hemorrhage affecting left non-dominant side: Secondary | ICD-10-CM | POA: Diagnosis not present

## 2015-09-28 DIAGNOSIS — R1312 Dysphagia, oropharyngeal phase: Secondary | ICD-10-CM | POA: Diagnosis not present

## 2015-09-28 DIAGNOSIS — R2681 Unsteadiness on feet: Secondary | ICD-10-CM | POA: Diagnosis not present

## 2015-09-28 DIAGNOSIS — M6281 Muscle weakness (generalized): Secondary | ICD-10-CM | POA: Diagnosis not present

## 2015-09-29 DIAGNOSIS — I69154 Hemiplegia and hemiparesis following nontraumatic intracerebral hemorrhage affecting left non-dominant side: Secondary | ICD-10-CM | POA: Diagnosis not present

## 2015-09-29 DIAGNOSIS — R05 Cough: Secondary | ICD-10-CM | POA: Diagnosis not present

## 2015-09-29 DIAGNOSIS — I10 Essential (primary) hypertension: Secondary | ICD-10-CM | POA: Diagnosis not present

## 2015-09-29 DIAGNOSIS — F802 Mixed receptive-expressive language disorder: Secondary | ICD-10-CM | POA: Diagnosis not present

## 2015-09-29 DIAGNOSIS — E782 Mixed hyperlipidemia: Secondary | ICD-10-CM | POA: Diagnosis not present

## 2015-09-29 DIAGNOSIS — R1312 Dysphagia, oropharyngeal phase: Secondary | ICD-10-CM | POA: Diagnosis not present

## 2015-09-29 DIAGNOSIS — M6281 Muscle weakness (generalized): Secondary | ICD-10-CM | POA: Diagnosis not present

## 2015-09-29 DIAGNOSIS — R918 Other nonspecific abnormal finding of lung field: Secondary | ICD-10-CM | POA: Diagnosis not present

## 2015-09-29 DIAGNOSIS — Z789 Other specified health status: Secondary | ICD-10-CM | POA: Diagnosis not present

## 2015-09-29 DIAGNOSIS — R0789 Other chest pain: Secondary | ICD-10-CM | POA: Diagnosis not present

## 2015-09-29 DIAGNOSIS — Z8673 Personal history of transient ischemic attack (TIA), and cerebral infarction without residual deficits: Secondary | ICD-10-CM | POA: Diagnosis not present

## 2015-09-29 DIAGNOSIS — J168 Pneumonia due to other specified infectious organisms: Secondary | ICD-10-CM | POA: Diagnosis not present

## 2015-09-29 DIAGNOSIS — R2681 Unsteadiness on feet: Secondary | ICD-10-CM | POA: Diagnosis not present

## 2015-09-30 DIAGNOSIS — M6281 Muscle weakness (generalized): Secondary | ICD-10-CM | POA: Diagnosis not present

## 2015-09-30 DIAGNOSIS — J168 Pneumonia due to other specified infectious organisms: Secondary | ICD-10-CM | POA: Diagnosis not present

## 2015-09-30 DIAGNOSIS — F802 Mixed receptive-expressive language disorder: Secondary | ICD-10-CM | POA: Diagnosis not present

## 2015-09-30 DIAGNOSIS — I69154 Hemiplegia and hemiparesis following nontraumatic intracerebral hemorrhage affecting left non-dominant side: Secondary | ICD-10-CM | POA: Diagnosis not present

## 2015-09-30 DIAGNOSIS — R2681 Unsteadiness on feet: Secondary | ICD-10-CM | POA: Diagnosis not present

## 2015-09-30 DIAGNOSIS — R1312 Dysphagia, oropharyngeal phase: Secondary | ICD-10-CM | POA: Diagnosis not present

## 2015-10-01 DIAGNOSIS — I69154 Hemiplegia and hemiparesis following nontraumatic intracerebral hemorrhage affecting left non-dominant side: Secondary | ICD-10-CM | POA: Diagnosis not present

## 2015-10-01 DIAGNOSIS — J168 Pneumonia due to other specified infectious organisms: Secondary | ICD-10-CM | POA: Diagnosis not present

## 2015-10-01 DIAGNOSIS — F802 Mixed receptive-expressive language disorder: Secondary | ICD-10-CM | POA: Diagnosis not present

## 2015-10-01 DIAGNOSIS — R1312 Dysphagia, oropharyngeal phase: Secondary | ICD-10-CM | POA: Diagnosis not present

## 2015-10-01 DIAGNOSIS — R2681 Unsteadiness on feet: Secondary | ICD-10-CM | POA: Diagnosis not present

## 2015-10-01 DIAGNOSIS — M6281 Muscle weakness (generalized): Secondary | ICD-10-CM | POA: Diagnosis not present

## 2015-10-03 DIAGNOSIS — R1312 Dysphagia, oropharyngeal phase: Secondary | ICD-10-CM | POA: Diagnosis not present

## 2015-10-03 DIAGNOSIS — M6281 Muscle weakness (generalized): Secondary | ICD-10-CM | POA: Diagnosis not present

## 2015-10-03 DIAGNOSIS — F802 Mixed receptive-expressive language disorder: Secondary | ICD-10-CM | POA: Diagnosis not present

## 2015-10-03 DIAGNOSIS — J168 Pneumonia due to other specified infectious organisms: Secondary | ICD-10-CM | POA: Diagnosis not present

## 2015-10-03 DIAGNOSIS — I69154 Hemiplegia and hemiparesis following nontraumatic intracerebral hemorrhage affecting left non-dominant side: Secondary | ICD-10-CM | POA: Diagnosis not present

## 2015-10-03 DIAGNOSIS — R2681 Unsteadiness on feet: Secondary | ICD-10-CM | POA: Diagnosis not present

## 2015-10-04 DIAGNOSIS — M6281 Muscle weakness (generalized): Secondary | ICD-10-CM | POA: Diagnosis not present

## 2015-10-04 DIAGNOSIS — J168 Pneumonia due to other specified infectious organisms: Secondary | ICD-10-CM | POA: Diagnosis not present

## 2015-10-04 DIAGNOSIS — R2681 Unsteadiness on feet: Secondary | ICD-10-CM | POA: Diagnosis not present

## 2015-10-04 DIAGNOSIS — I69154 Hemiplegia and hemiparesis following nontraumatic intracerebral hemorrhage affecting left non-dominant side: Secondary | ICD-10-CM | POA: Diagnosis not present

## 2015-10-04 DIAGNOSIS — R1312 Dysphagia, oropharyngeal phase: Secondary | ICD-10-CM | POA: Diagnosis not present

## 2015-10-04 DIAGNOSIS — F802 Mixed receptive-expressive language disorder: Secondary | ICD-10-CM | POA: Diagnosis not present

## 2015-10-05 DIAGNOSIS — I69154 Hemiplegia and hemiparesis following nontraumatic intracerebral hemorrhage affecting left non-dominant side: Secondary | ICD-10-CM | POA: Diagnosis not present

## 2015-10-05 DIAGNOSIS — R1312 Dysphagia, oropharyngeal phase: Secondary | ICD-10-CM | POA: Diagnosis not present

## 2015-10-05 DIAGNOSIS — M6281 Muscle weakness (generalized): Secondary | ICD-10-CM | POA: Diagnosis not present

## 2015-10-05 DIAGNOSIS — F802 Mixed receptive-expressive language disorder: Secondary | ICD-10-CM | POA: Diagnosis not present

## 2015-10-05 DIAGNOSIS — J168 Pneumonia due to other specified infectious organisms: Secondary | ICD-10-CM | POA: Diagnosis not present

## 2015-10-05 DIAGNOSIS — R2681 Unsteadiness on feet: Secondary | ICD-10-CM | POA: Diagnosis not present

## 2015-10-06 DIAGNOSIS — R2681 Unsteadiness on feet: Secondary | ICD-10-CM | POA: Diagnosis not present

## 2015-10-06 DIAGNOSIS — F802 Mixed receptive-expressive language disorder: Secondary | ICD-10-CM | POA: Diagnosis not present

## 2015-10-06 DIAGNOSIS — R1312 Dysphagia, oropharyngeal phase: Secondary | ICD-10-CM | POA: Diagnosis not present

## 2015-10-06 DIAGNOSIS — J168 Pneumonia due to other specified infectious organisms: Secondary | ICD-10-CM | POA: Diagnosis not present

## 2015-10-06 DIAGNOSIS — M6281 Muscle weakness (generalized): Secondary | ICD-10-CM | POA: Diagnosis not present

## 2015-10-06 DIAGNOSIS — I69154 Hemiplegia and hemiparesis following nontraumatic intracerebral hemorrhage affecting left non-dominant side: Secondary | ICD-10-CM | POA: Diagnosis not present

## 2015-10-07 DIAGNOSIS — I69154 Hemiplegia and hemiparesis following nontraumatic intracerebral hemorrhage affecting left non-dominant side: Secondary | ICD-10-CM | POA: Diagnosis not present

## 2015-10-07 DIAGNOSIS — J168 Pneumonia due to other specified infectious organisms: Secondary | ICD-10-CM | POA: Diagnosis not present

## 2015-10-07 DIAGNOSIS — M6281 Muscle weakness (generalized): Secondary | ICD-10-CM | POA: Diagnosis not present

## 2015-10-07 DIAGNOSIS — R2681 Unsteadiness on feet: Secondary | ICD-10-CM | POA: Diagnosis not present

## 2015-10-07 DIAGNOSIS — R1312 Dysphagia, oropharyngeal phase: Secondary | ICD-10-CM | POA: Diagnosis not present

## 2015-10-07 DIAGNOSIS — F802 Mixed receptive-expressive language disorder: Secondary | ICD-10-CM | POA: Diagnosis not present

## 2015-10-08 DIAGNOSIS — R2681 Unsteadiness on feet: Secondary | ICD-10-CM | POA: Diagnosis not present

## 2015-10-08 DIAGNOSIS — R1312 Dysphagia, oropharyngeal phase: Secondary | ICD-10-CM | POA: Diagnosis not present

## 2015-10-08 DIAGNOSIS — F802 Mixed receptive-expressive language disorder: Secondary | ICD-10-CM | POA: Diagnosis not present

## 2015-10-08 DIAGNOSIS — J168 Pneumonia due to other specified infectious organisms: Secondary | ICD-10-CM | POA: Diagnosis not present

## 2015-10-08 DIAGNOSIS — B351 Tinea unguium: Secondary | ICD-10-CM | POA: Diagnosis not present

## 2015-10-08 DIAGNOSIS — M79672 Pain in left foot: Secondary | ICD-10-CM | POA: Diagnosis not present

## 2015-10-08 DIAGNOSIS — E119 Type 2 diabetes mellitus without complications: Secondary | ICD-10-CM | POA: Diagnosis not present

## 2015-10-08 DIAGNOSIS — I69154 Hemiplegia and hemiparesis following nontraumatic intracerebral hemorrhage affecting left non-dominant side: Secondary | ICD-10-CM | POA: Diagnosis not present

## 2015-10-08 DIAGNOSIS — M79671 Pain in right foot: Secondary | ICD-10-CM | POA: Diagnosis not present

## 2015-10-08 DIAGNOSIS — M6281 Muscle weakness (generalized): Secondary | ICD-10-CM | POA: Diagnosis not present

## 2015-10-09 DIAGNOSIS — I69154 Hemiplegia and hemiparesis following nontraumatic intracerebral hemorrhage affecting left non-dominant side: Secondary | ICD-10-CM | POA: Diagnosis not present

## 2015-10-09 DIAGNOSIS — F802 Mixed receptive-expressive language disorder: Secondary | ICD-10-CM | POA: Diagnosis not present

## 2015-10-09 DIAGNOSIS — R1312 Dysphagia, oropharyngeal phase: Secondary | ICD-10-CM | POA: Diagnosis not present

## 2015-10-09 DIAGNOSIS — R2681 Unsteadiness on feet: Secondary | ICD-10-CM | POA: Diagnosis not present

## 2015-10-09 DIAGNOSIS — J168 Pneumonia due to other specified infectious organisms: Secondary | ICD-10-CM | POA: Diagnosis not present

## 2015-10-09 DIAGNOSIS — M6281 Muscle weakness (generalized): Secondary | ICD-10-CM | POA: Diagnosis not present

## 2015-10-09 DIAGNOSIS — R41841 Cognitive communication deficit: Secondary | ICD-10-CM | POA: Diagnosis not present

## 2015-10-10 DIAGNOSIS — M6281 Muscle weakness (generalized): Secondary | ICD-10-CM | POA: Diagnosis not present

## 2015-10-10 DIAGNOSIS — I69154 Hemiplegia and hemiparesis following nontraumatic intracerebral hemorrhage affecting left non-dominant side: Secondary | ICD-10-CM | POA: Diagnosis not present

## 2015-10-10 DIAGNOSIS — J168 Pneumonia due to other specified infectious organisms: Secondary | ICD-10-CM | POA: Diagnosis not present

## 2015-10-10 DIAGNOSIS — R2681 Unsteadiness on feet: Secondary | ICD-10-CM | POA: Diagnosis not present

## 2015-10-10 DIAGNOSIS — R1312 Dysphagia, oropharyngeal phase: Secondary | ICD-10-CM | POA: Diagnosis not present

## 2015-10-10 DIAGNOSIS — F802 Mixed receptive-expressive language disorder: Secondary | ICD-10-CM | POA: Diagnosis not present

## 2015-10-12 DIAGNOSIS — R2681 Unsteadiness on feet: Secondary | ICD-10-CM | POA: Diagnosis not present

## 2015-10-12 DIAGNOSIS — F802 Mixed receptive-expressive language disorder: Secondary | ICD-10-CM | POA: Diagnosis not present

## 2015-10-12 DIAGNOSIS — J168 Pneumonia due to other specified infectious organisms: Secondary | ICD-10-CM | POA: Diagnosis not present

## 2015-10-12 DIAGNOSIS — M6281 Muscle weakness (generalized): Secondary | ICD-10-CM | POA: Diagnosis not present

## 2015-10-12 DIAGNOSIS — I69154 Hemiplegia and hemiparesis following nontraumatic intracerebral hemorrhage affecting left non-dominant side: Secondary | ICD-10-CM | POA: Diagnosis not present

## 2015-10-12 DIAGNOSIS — R1312 Dysphagia, oropharyngeal phase: Secondary | ICD-10-CM | POA: Diagnosis not present

## 2015-10-13 DIAGNOSIS — F802 Mixed receptive-expressive language disorder: Secondary | ICD-10-CM | POA: Diagnosis not present

## 2015-10-13 DIAGNOSIS — R1312 Dysphagia, oropharyngeal phase: Secondary | ICD-10-CM | POA: Diagnosis not present

## 2015-10-13 DIAGNOSIS — R2681 Unsteadiness on feet: Secondary | ICD-10-CM | POA: Diagnosis not present

## 2015-10-13 DIAGNOSIS — M6281 Muscle weakness (generalized): Secondary | ICD-10-CM | POA: Diagnosis not present

## 2015-10-13 DIAGNOSIS — J168 Pneumonia due to other specified infectious organisms: Secondary | ICD-10-CM | POA: Diagnosis not present

## 2015-10-13 DIAGNOSIS — I69154 Hemiplegia and hemiparesis following nontraumatic intracerebral hemorrhage affecting left non-dominant side: Secondary | ICD-10-CM | POA: Diagnosis not present

## 2015-10-14 DIAGNOSIS — M6281 Muscle weakness (generalized): Secondary | ICD-10-CM | POA: Diagnosis not present

## 2015-10-14 DIAGNOSIS — J168 Pneumonia due to other specified infectious organisms: Secondary | ICD-10-CM | POA: Diagnosis not present

## 2015-10-14 DIAGNOSIS — I69154 Hemiplegia and hemiparesis following nontraumatic intracerebral hemorrhage affecting left non-dominant side: Secondary | ICD-10-CM | POA: Diagnosis not present

## 2015-10-14 DIAGNOSIS — R2681 Unsteadiness on feet: Secondary | ICD-10-CM | POA: Diagnosis not present

## 2015-10-14 DIAGNOSIS — R1312 Dysphagia, oropharyngeal phase: Secondary | ICD-10-CM | POA: Diagnosis not present

## 2015-10-14 DIAGNOSIS — F802 Mixed receptive-expressive language disorder: Secondary | ICD-10-CM | POA: Diagnosis not present

## 2015-10-15 DIAGNOSIS — J168 Pneumonia due to other specified infectious organisms: Secondary | ICD-10-CM | POA: Diagnosis not present

## 2015-10-15 DIAGNOSIS — F802 Mixed receptive-expressive language disorder: Secondary | ICD-10-CM | POA: Diagnosis not present

## 2015-10-15 DIAGNOSIS — R1312 Dysphagia, oropharyngeal phase: Secondary | ICD-10-CM | POA: Diagnosis not present

## 2015-10-15 DIAGNOSIS — I69154 Hemiplegia and hemiparesis following nontraumatic intracerebral hemorrhage affecting left non-dominant side: Secondary | ICD-10-CM | POA: Diagnosis not present

## 2015-10-15 DIAGNOSIS — R2681 Unsteadiness on feet: Secondary | ICD-10-CM | POA: Diagnosis not present

## 2015-10-15 DIAGNOSIS — M6281 Muscle weakness (generalized): Secondary | ICD-10-CM | POA: Diagnosis not present

## 2015-10-16 DIAGNOSIS — R2681 Unsteadiness on feet: Secondary | ICD-10-CM | POA: Diagnosis not present

## 2015-10-16 DIAGNOSIS — F802 Mixed receptive-expressive language disorder: Secondary | ICD-10-CM | POA: Diagnosis not present

## 2015-10-16 DIAGNOSIS — J168 Pneumonia due to other specified infectious organisms: Secondary | ICD-10-CM | POA: Diagnosis not present

## 2015-10-16 DIAGNOSIS — R1312 Dysphagia, oropharyngeal phase: Secondary | ICD-10-CM | POA: Diagnosis not present

## 2015-10-16 DIAGNOSIS — I69154 Hemiplegia and hemiparesis following nontraumatic intracerebral hemorrhage affecting left non-dominant side: Secondary | ICD-10-CM | POA: Diagnosis not present

## 2015-10-16 DIAGNOSIS — M6281 Muscle weakness (generalized): Secondary | ICD-10-CM | POA: Diagnosis not present

## 2015-10-18 DIAGNOSIS — J168 Pneumonia due to other specified infectious organisms: Secondary | ICD-10-CM | POA: Diagnosis not present

## 2015-10-18 DIAGNOSIS — I69154 Hemiplegia and hemiparesis following nontraumatic intracerebral hemorrhage affecting left non-dominant side: Secondary | ICD-10-CM | POA: Diagnosis not present

## 2015-10-18 DIAGNOSIS — R1312 Dysphagia, oropharyngeal phase: Secondary | ICD-10-CM | POA: Diagnosis not present

## 2015-10-18 DIAGNOSIS — R2681 Unsteadiness on feet: Secondary | ICD-10-CM | POA: Diagnosis not present

## 2015-10-18 DIAGNOSIS — M6281 Muscle weakness (generalized): Secondary | ICD-10-CM | POA: Diagnosis not present

## 2015-10-18 DIAGNOSIS — F802 Mixed receptive-expressive language disorder: Secondary | ICD-10-CM | POA: Diagnosis not present

## 2015-10-19 ENCOUNTER — Encounter: Payer: Self-pay | Admitting: Adult Health

## 2015-10-19 DIAGNOSIS — I69154 Hemiplegia and hemiparesis following nontraumatic intracerebral hemorrhage affecting left non-dominant side: Secondary | ICD-10-CM | POA: Diagnosis not present

## 2015-10-19 DIAGNOSIS — F802 Mixed receptive-expressive language disorder: Secondary | ICD-10-CM | POA: Diagnosis not present

## 2015-10-19 DIAGNOSIS — E785 Hyperlipidemia, unspecified: Secondary | ICD-10-CM

## 2015-10-19 DIAGNOSIS — K279 Peptic ulcer, site unspecified, unspecified as acute or chronic, without hemorrhage or perforation: Secondary | ICD-10-CM

## 2015-10-19 DIAGNOSIS — R1312 Dysphagia, oropharyngeal phase: Secondary | ICD-10-CM | POA: Diagnosis not present

## 2015-10-19 DIAGNOSIS — B9681 Helicobacter pylori [H. pylori] as the cause of diseases classified elsewhere: Secondary | ICD-10-CM | POA: Insufficient documentation

## 2015-10-19 DIAGNOSIS — M6281 Muscle weakness (generalized): Secondary | ICD-10-CM | POA: Diagnosis not present

## 2015-10-19 DIAGNOSIS — J168 Pneumonia due to other specified infectious organisms: Secondary | ICD-10-CM | POA: Diagnosis not present

## 2015-10-19 DIAGNOSIS — E1169 Type 2 diabetes mellitus with other specified complication: Secondary | ICD-10-CM | POA: Insufficient documentation

## 2015-10-19 DIAGNOSIS — R2681 Unsteadiness on feet: Secondary | ICD-10-CM | POA: Diagnosis not present

## 2015-10-19 NOTE — Progress Notes (Signed)
Patient ID: [name] [name], male   DOB: May 08, 1961, 54 y.o.   MRN: [id number]   Facility: [facility]      No Known Allergies  Chief Complaint  Patient presents with  . Hospitalization Follow-up    HPI:  He has been hospitalized for a GI bleed requiring 5 units of pRBCs. He was tested positive for H pylori and is currently under treatment for that. He is a long term resident of this facility. He is now on nectar thick liquids. He is unable to fully participate in the hpi or ros.    Past Medical History  Diagnosis Date  . Stroke ([place])   . Hypertension   . Hyperlipidemia   . Diabetes mellitus without complication ([place])   . Fall at home   . Hemiparesis affecting left side as late effect of stroke ([place])   . Dysphagia as late effect of cerebrovascular disease   . Acute respiratory failure ([place])   . Cerebral edema [facility])     Past Surgical History  Procedure Laterality Date  . Gastrostomy tube placement  05-19-15  . Mechanical thrombectomy angioplasty stenet placement    . Esophagogastroduodenoscopy N/A 08/12/2015    Procedure: ESOPHAGOGASTRODUODENOSCOPY (EGD);  Surgeon: Ladene Artist, MD;  Location: [facility] ENDOSCOPY;  Service: Endoscopy;  Laterality: N/A;    VITAL SIGNS BP 163/93 mmHg  Pulse 70  Ht 6' (1.829 m)  Wt 230 lb (104.327 kg)  BMI 31.19 kg/m2  Patient's Medications  New Prescriptions   No medications on file  Previous Medications   lipitor 80 mg Take 80 mg daily    Symmetrel 200 mg  Take 200 mg twice daily   norvasc 10 mg  Take 10 mg daily    Asa 325 mg  Take 325 mg daily    plavix 75 mg  Take 75 mg daily    Fenofibrate 40 mg  Take 40 mg daily   prozac 20 mg  Take 20 mg daily    hctz 25 mg  Take 25 mg daily    Lisinopril 40 mg  Take 40 mg daily    vicodin 5/325 mg  Every 6 hours as needed   Minoxidil 5 mg  Take 5 mg twice daily    MVI  Take one daily    protonix 40 mg  Take 40 mg twice daily    Thick it  Nectar thick    Senna s Take one daily   Modified  Medications   No medications on file  Discontinued Medications   No medications on file     SIGNIFICANT DIAGNOSTIC EXAMS   04-28-15: ct of head: 1. Right medial parietal hematoma, stable. 2. 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 No new hemorrhage   05-08-15: ct of head: 1. Findings consistent with evolution of patchy regions of right MCA infarction, status post thrombectomy and stenting of M1. 2. Negative for hemorrhage  05-08-15: MRI of brain: 1. Findings consistent with large region of acute ischemia int he right MCA distrubution. On evidence of hemorrhage. 2. Interval progression of chronic small vessel ischemic changes.   05-08-15: 2-d echo: mild LV dysfunction EF 50%; with moderate LVH; normal LA pressures with diastolic dysfunction. Normal right ventricular systolic function. Valvular regurgitation: trivial MR; trivial TR; no valvular stenosis.    05-09-15: EKG: sinus bradycardia   05-09-15: chest x-ry: 1. Stable enlarged cardiac and mediastinal contours. 2. Small left pleural effusion with underlying atelectasis   05-09-15: diagnostic cerebral angiogram: 1.  Right common femoral angiogram demonstrates satisfactory placement of sheath within the right common femoral artery, above the common femoral bifurcation and below  the origin of the inferior epigraastric artery with adequate vascular caliber for use of an arterial closure device. Right common carotid angiography demonstrates stable positioning and configuration of right mca enterprise stent device with preserved stent patency an antegrade opacification of distal right mca territory. Stable positioning and configuration of trigt MCA M1 segment enterprise stent device with preserved stent patency and antegrade opacification of right MCA territory   05-11-15: left upper extremity venous ultrasound: negative for dvt  05-28-15: ct of head; 1. Right medial parietal hematoma , stable. 2. findings of right MCA territory  laminar necrosis unchanged. No new hemorrhage  07-22-15: chest x-ray: suspect small right basilar infiltrate and effusion. Consider aspiration pneumonitis with gram-negative organism   08-08-15: ct of head: Prior RIGHT MCA territory infarct. Small vessel chronic ischemic changes of deep cerebral white matter. No acute intracranial abnormalities. Sphenoid sinus opacification.  08-08-15: chest x-ray: Persistent asymmetric right lower lung airspace opacity, suspicious for pneumonia  08-12-15: swallow study: Dysphagia 2 (Fine chop);Nectar   08-12-15: upper endoscopy: 1. Erosive gastritis in gastric antrum. 2. non erosive duodenitis int he duodenal bulb. 3. proir G tube site in gastric stomach.      LABS REVIEWED:   05-08-15: hgb a1c 5.9; chol 253; trig 424; hdl 47  05-26-15: glucose 126; bun 20; creat 1.0; k+4.0; na++ 134 05-28-15: wbc 5.4; hgb 12.0; hct 34.6; mcv 86; plt 233; glucose 106; bun 26; creat 1.6; k+3.8; na++ 138 05-29-15: wbc 5.7; hgb 12.6; hct 36.6; mcv 86; plt 215;glcuose 156; bun 18; creat 1.1; k+3.8; na++ 135 08-08-15: wbc 13.9; hgb 5.0; hct 15.6;mcv 85.7; plt 368; glucose 126; bun 43; creat 1.1; k+ 3.6; na++140; alk phos 144; albumin 21; guaiac: + 08-09-15: wbc 12.4; hgb 5.7; hct 17.7;mcv 85.5; plt 288; glucose 119; bun 34; creat 0.84; k+ 3.7; na++141   08-10-15: wbc 8.9; hgb 7.1; hct 21.5; mcv 86.3; plt 252; glucose 111; bun 23; creat 0.92; k+ 3.7; na++143 08-12-15: wbc 8.1; hgb 8.4; hct 25.2; mcv 85.1; plt 198; glucose 100; bun 7; creat 0.78; k+ 4.3; na++143 08-14-15; wbc 6.4; hgb 8.5; hct 26.1; mcv 85.9; plt 235; glucose 88; bun 5; creat 0.82; k+ 3.3; na++137     Review of Systems  Unable to perform ROS: other      Physical Exam Vitals reviewed. Constitutional: No distress.  Obese   Eyes: Conjunctivae are normal.  Neck: Neck supple. No JVD present. No thyromegaly present.  Cardiovascular: Normal rate, regular rhythm and intact distal pulses.   Respiratory: Effort  normal and breath sounds normal. No respiratory distress. He has no wheezes.  GI: Soft. Bowel sounds are normal. He exhibits no distension. There is no tenderness.   Musculoskeletal: He exhibits no edema.  Has left hemiparesis with gross movement present  Is able to move right side extremities.   Lymphadenopathy:    He has no cervical adenopathy.  Neurological: He is alert.  Skin: Skin is warm and dry. He is not diaphoretic.  Psychiatric: He has a normal mood and affect.     ASSESSMENT/ PLAN:  1.  Acute cva with left hemiparesis: is neurologically stable; will continue asa 325 mg daily; and plavix 75 mg daily will continue amantadine 200 mg twice daily for abnormal movements and will monitor his status.   2. Hypertension: will continue norvasc 10 mg daily; hctz 25 mg daily; lisinopril 40  mg daily minoxidil 5 mg twice daily will begin clonidine 0.1 mg twice daily will have nursing check blood pressure every shift  3. Dyslipidemia: will continue lipitor 80 mg daily and will begin fenofibrate 40 mg daily trig 424 and total cholesterol 253    4. Diabetes with neurological effects: his hgb a1c is 5.9; diet controlled;  5. Constipation: will continue senna s daily  6. Depression: will continue prozac 20 mg daily will monitor his status.   7. Dysphagia: no signs of aspiration present;is on nectar thick liquids; peg tube has been removed.   8. Hpylori: will continue amoxil 1 gm twice daily; biaxin 500 gm twice daily and protonix 40 mg twice daily    Will check cbc; bmp    Time spent with patient  50  minutes >50% time spent counseling; reviewing medical record; tests; labs; and developing future plan of care     Ok Edwards NP [facility]  Contact [phone] Monday through Friday 8am- 5pm  After hours call [phone]

## 2015-10-20 ENCOUNTER — Non-Acute Institutional Stay (SKILLED_NURSING_FACILITY): Payer: Medicare Other | Admitting: Adult Health

## 2015-10-20 DIAGNOSIS — E785 Hyperlipidemia, unspecified: Secondary | ICD-10-CM | POA: Diagnosis not present

## 2015-10-20 DIAGNOSIS — R1312 Dysphagia, oropharyngeal phase: Secondary | ICD-10-CM | POA: Diagnosis not present

## 2015-10-20 DIAGNOSIS — E1149 Type 2 diabetes mellitus with other diabetic neurological complication: Secondary | ICD-10-CM

## 2015-10-20 DIAGNOSIS — B9681 Helicobacter pylori [H. pylori] as the cause of diseases classified elsewhere: Secondary | ICD-10-CM | POA: Diagnosis not present

## 2015-10-20 DIAGNOSIS — F0631 Mood disorder due to known physiological condition with depressive features: Secondary | ICD-10-CM | POA: Diagnosis not present

## 2015-10-20 DIAGNOSIS — F0391 Unspecified dementia with behavioral disturbance: Secondary | ICD-10-CM

## 2015-10-20 DIAGNOSIS — R2681 Unsteadiness on feet: Secondary | ICD-10-CM | POA: Diagnosis not present

## 2015-10-20 DIAGNOSIS — E1169 Type 2 diabetes mellitus with other specified complication: Secondary | ICD-10-CM

## 2015-10-20 DIAGNOSIS — K279 Peptic ulcer, site unspecified, unspecified as acute or chronic, without hemorrhage or perforation: Secondary | ICD-10-CM

## 2015-10-20 DIAGNOSIS — I69154 Hemiplegia and hemiparesis following nontraumatic intracerebral hemorrhage affecting left non-dominant side: Secondary | ICD-10-CM | POA: Diagnosis not present

## 2015-10-20 DIAGNOSIS — I639 Cerebral infarction, unspecified: Secondary | ICD-10-CM

## 2015-10-20 DIAGNOSIS — IMO0002 Reserved for concepts with insufficient information to code with codable children: Secondary | ICD-10-CM

## 2015-10-20 DIAGNOSIS — K5900 Constipation, unspecified: Secondary | ICD-10-CM | POA: Diagnosis not present

## 2015-10-20 DIAGNOSIS — I69391 Dysphagia following cerebral infarction: Secondary | ICD-10-CM

## 2015-10-20 DIAGNOSIS — I69354 Hemiplegia and hemiparesis following cerebral infarction affecting left non-dominant side: Secondary | ICD-10-CM | POA: Diagnosis not present

## 2015-10-20 DIAGNOSIS — F03918 Unspecified dementia, unspecified severity, with other behavioral disturbance: Secondary | ICD-10-CM

## 2015-10-20 DIAGNOSIS — F802 Mixed receptive-expressive language disorder: Secondary | ICD-10-CM | POA: Diagnosis not present

## 2015-10-20 DIAGNOSIS — J168 Pneumonia due to other specified infectious organisms: Secondary | ICD-10-CM | POA: Diagnosis not present

## 2015-10-20 DIAGNOSIS — M6281 Muscle weakness (generalized): Secondary | ICD-10-CM | POA: Diagnosis not present

## 2015-10-20 DIAGNOSIS — K5909 Other constipation: Secondary | ICD-10-CM

## 2015-10-20 DIAGNOSIS — I10 Essential (primary) hypertension: Secondary | ICD-10-CM

## 2015-10-21 DIAGNOSIS — J168 Pneumonia due to other specified infectious organisms: Secondary | ICD-10-CM | POA: Diagnosis not present

## 2015-10-21 DIAGNOSIS — I69154 Hemiplegia and hemiparesis following nontraumatic intracerebral hemorrhage affecting left non-dominant side: Secondary | ICD-10-CM | POA: Diagnosis not present

## 2015-10-21 DIAGNOSIS — R1312 Dysphagia, oropharyngeal phase: Secondary | ICD-10-CM | POA: Diagnosis not present

## 2015-10-21 DIAGNOSIS — R2681 Unsteadiness on feet: Secondary | ICD-10-CM | POA: Diagnosis not present

## 2015-10-21 DIAGNOSIS — F802 Mixed receptive-expressive language disorder: Secondary | ICD-10-CM | POA: Diagnosis not present

## 2015-10-21 DIAGNOSIS — M6281 Muscle weakness (generalized): Secondary | ICD-10-CM | POA: Diagnosis not present

## 2015-10-22 DIAGNOSIS — M6281 Muscle weakness (generalized): Secondary | ICD-10-CM | POA: Diagnosis not present

## 2015-10-22 DIAGNOSIS — I69154 Hemiplegia and hemiparesis following nontraumatic intracerebral hemorrhage affecting left non-dominant side: Secondary | ICD-10-CM | POA: Diagnosis not present

## 2015-10-22 DIAGNOSIS — R1312 Dysphagia, oropharyngeal phase: Secondary | ICD-10-CM | POA: Diagnosis not present

## 2015-10-22 DIAGNOSIS — R2681 Unsteadiness on feet: Secondary | ICD-10-CM | POA: Diagnosis not present

## 2015-10-22 DIAGNOSIS — F802 Mixed receptive-expressive language disorder: Secondary | ICD-10-CM | POA: Diagnosis not present

## 2015-10-22 DIAGNOSIS — J168 Pneumonia due to other specified infectious organisms: Secondary | ICD-10-CM | POA: Diagnosis not present

## 2015-10-23 ENCOUNTER — Other Ambulatory Visit (HOSPITAL_COMMUNITY): Payer: Self-pay | Admitting: Internal Medicine

## 2015-10-23 DIAGNOSIS — F802 Mixed receptive-expressive language disorder: Secondary | ICD-10-CM | POA: Diagnosis not present

## 2015-10-23 DIAGNOSIS — M6281 Muscle weakness (generalized): Secondary | ICD-10-CM | POA: Diagnosis not present

## 2015-10-23 DIAGNOSIS — R1312 Dysphagia, oropharyngeal phase: Secondary | ICD-10-CM | POA: Diagnosis not present

## 2015-10-23 DIAGNOSIS — J168 Pneumonia due to other specified infectious organisms: Secondary | ICD-10-CM | POA: Diagnosis not present

## 2015-10-23 DIAGNOSIS — R1310 Dysphagia, unspecified: Secondary | ICD-10-CM

## 2015-10-23 DIAGNOSIS — I69154 Hemiplegia and hemiparesis following nontraumatic intracerebral hemorrhage affecting left non-dominant side: Secondary | ICD-10-CM | POA: Diagnosis not present

## 2015-10-23 DIAGNOSIS — R2681 Unsteadiness on feet: Secondary | ICD-10-CM | POA: Diagnosis not present

## 2015-10-24 DIAGNOSIS — R2681 Unsteadiness on feet: Secondary | ICD-10-CM | POA: Diagnosis not present

## 2015-10-24 DIAGNOSIS — J168 Pneumonia due to other specified infectious organisms: Secondary | ICD-10-CM | POA: Diagnosis not present

## 2015-10-24 DIAGNOSIS — M6281 Muscle weakness (generalized): Secondary | ICD-10-CM | POA: Diagnosis not present

## 2015-10-24 DIAGNOSIS — R1312 Dysphagia, oropharyngeal phase: Secondary | ICD-10-CM | POA: Diagnosis not present

## 2015-10-24 DIAGNOSIS — I69154 Hemiplegia and hemiparesis following nontraumatic intracerebral hemorrhage affecting left non-dominant side: Secondary | ICD-10-CM | POA: Diagnosis not present

## 2015-10-24 DIAGNOSIS — F802 Mixed receptive-expressive language disorder: Secondary | ICD-10-CM | POA: Diagnosis not present

## 2015-10-26 DIAGNOSIS — R1312 Dysphagia, oropharyngeal phase: Secondary | ICD-10-CM | POA: Diagnosis not present

## 2015-10-26 DIAGNOSIS — M6281 Muscle weakness (generalized): Secondary | ICD-10-CM | POA: Diagnosis not present

## 2015-10-26 DIAGNOSIS — I69154 Hemiplegia and hemiparesis following nontraumatic intracerebral hemorrhage affecting left non-dominant side: Secondary | ICD-10-CM | POA: Diagnosis not present

## 2015-10-26 DIAGNOSIS — F802 Mixed receptive-expressive language disorder: Secondary | ICD-10-CM | POA: Diagnosis not present

## 2015-10-26 DIAGNOSIS — J168 Pneumonia due to other specified infectious organisms: Secondary | ICD-10-CM | POA: Diagnosis not present

## 2015-10-26 DIAGNOSIS — R2681 Unsteadiness on feet: Secondary | ICD-10-CM | POA: Diagnosis not present

## 2015-10-27 DIAGNOSIS — R1312 Dysphagia, oropharyngeal phase: Secondary | ICD-10-CM | POA: Diagnosis not present

## 2015-10-27 DIAGNOSIS — F802 Mixed receptive-expressive language disorder: Secondary | ICD-10-CM | POA: Diagnosis not present

## 2015-10-27 DIAGNOSIS — I69154 Hemiplegia and hemiparesis following nontraumatic intracerebral hemorrhage affecting left non-dominant side: Secondary | ICD-10-CM | POA: Diagnosis not present

## 2015-10-27 DIAGNOSIS — J168 Pneumonia due to other specified infectious organisms: Secondary | ICD-10-CM | POA: Diagnosis not present

## 2015-10-27 DIAGNOSIS — R2681 Unsteadiness on feet: Secondary | ICD-10-CM | POA: Diagnosis not present

## 2015-10-27 DIAGNOSIS — M6281 Muscle weakness (generalized): Secondary | ICD-10-CM | POA: Diagnosis not present

## 2015-10-28 DIAGNOSIS — R1312 Dysphagia, oropharyngeal phase: Secondary | ICD-10-CM | POA: Diagnosis not present

## 2015-10-28 DIAGNOSIS — R2681 Unsteadiness on feet: Secondary | ICD-10-CM | POA: Diagnosis not present

## 2015-10-28 DIAGNOSIS — I69154 Hemiplegia and hemiparesis following nontraumatic intracerebral hemorrhage affecting left non-dominant side: Secondary | ICD-10-CM | POA: Diagnosis not present

## 2015-10-28 DIAGNOSIS — E119 Type 2 diabetes mellitus without complications: Secondary | ICD-10-CM | POA: Diagnosis not present

## 2015-10-28 DIAGNOSIS — M6281 Muscle weakness (generalized): Secondary | ICD-10-CM | POA: Diagnosis not present

## 2015-10-28 DIAGNOSIS — J168 Pneumonia due to other specified infectious organisms: Secondary | ICD-10-CM | POA: Diagnosis not present

## 2015-10-28 DIAGNOSIS — H25813 Combined forms of age-related cataract, bilateral: Secondary | ICD-10-CM | POA: Diagnosis not present

## 2015-10-28 DIAGNOSIS — F802 Mixed receptive-expressive language disorder: Secondary | ICD-10-CM | POA: Diagnosis not present

## 2015-10-29 DIAGNOSIS — R2681 Unsteadiness on feet: Secondary | ICD-10-CM | POA: Diagnosis not present

## 2015-10-29 DIAGNOSIS — F802 Mixed receptive-expressive language disorder: Secondary | ICD-10-CM | POA: Diagnosis not present

## 2015-10-29 DIAGNOSIS — I69154 Hemiplegia and hemiparesis following nontraumatic intracerebral hemorrhage affecting left non-dominant side: Secondary | ICD-10-CM | POA: Diagnosis not present

## 2015-10-29 DIAGNOSIS — R1312 Dysphagia, oropharyngeal phase: Secondary | ICD-10-CM | POA: Diagnosis not present

## 2015-10-29 DIAGNOSIS — J168 Pneumonia due to other specified infectious organisms: Secondary | ICD-10-CM | POA: Diagnosis not present

## 2015-10-29 DIAGNOSIS — M6281 Muscle weakness (generalized): Secondary | ICD-10-CM | POA: Diagnosis not present

## 2015-10-30 ENCOUNTER — Ambulatory Visit (HOSPITAL_COMMUNITY)
Admission: RE | Admit: 2015-10-30 | Discharge: 2015-10-30 | Disposition: A | Discharge status: Home / Self Care | Payer: Medicare Other | Source: Ambulatory Visit | Attending: Internal Medicine | Admitting: Internal Medicine

## 2015-10-30 DIAGNOSIS — I69391 Dysphagia following cerebral infarction: Secondary | ICD-10-CM | POA: Diagnosis not present

## 2015-10-30 DIAGNOSIS — M6281 Muscle weakness (generalized): Secondary | ICD-10-CM | POA: Diagnosis not present

## 2015-10-30 DIAGNOSIS — R1312 Dysphagia, oropharyngeal phase: Secondary | ICD-10-CM | POA: Diagnosis not present

## 2015-10-30 DIAGNOSIS — R1310 Dysphagia, unspecified: Secondary | ICD-10-CM | POA: Insufficient documentation

## 2015-10-30 DIAGNOSIS — F802 Mixed receptive-expressive language disorder: Secondary | ICD-10-CM | POA: Diagnosis not present

## 2015-10-30 DIAGNOSIS — I69154 Hemiplegia and hemiparesis following nontraumatic intracerebral hemorrhage affecting left non-dominant side: Secondary | ICD-10-CM | POA: Diagnosis not present

## 2015-10-30 DIAGNOSIS — T17320D Food in larynx causing asphyxiation, subsequent encounter: Secondary | ICD-10-CM | POA: Diagnosis not present

## 2015-10-30 DIAGNOSIS — J168 Pneumonia due to other specified infectious organisms: Secondary | ICD-10-CM | POA: Diagnosis not present

## 2015-10-30 DIAGNOSIS — R2681 Unsteadiness on feet: Secondary | ICD-10-CM | POA: Diagnosis not present

## 2015-10-31 DIAGNOSIS — F802 Mixed receptive-expressive language disorder: Secondary | ICD-10-CM | POA: Diagnosis not present

## 2015-10-31 DIAGNOSIS — R2681 Unsteadiness on feet: Secondary | ICD-10-CM | POA: Diagnosis not present

## 2015-10-31 DIAGNOSIS — M6281 Muscle weakness (generalized): Secondary | ICD-10-CM | POA: Diagnosis not present

## 2015-10-31 DIAGNOSIS — R1312 Dysphagia, oropharyngeal phase: Secondary | ICD-10-CM | POA: Diagnosis not present

## 2015-10-31 DIAGNOSIS — J168 Pneumonia due to other specified infectious organisms: Secondary | ICD-10-CM | POA: Diagnosis not present

## 2015-10-31 DIAGNOSIS — I69154 Hemiplegia and hemiparesis following nontraumatic intracerebral hemorrhage affecting left non-dominant side: Secondary | ICD-10-CM | POA: Diagnosis not present

## 2015-11-03 ENCOUNTER — Encounter: Payer: Self-pay | Admitting: Adult Health

## 2015-11-03 DIAGNOSIS — R2681 Unsteadiness on feet: Secondary | ICD-10-CM | POA: Diagnosis not present

## 2015-11-03 DIAGNOSIS — R1312 Dysphagia, oropharyngeal phase: Secondary | ICD-10-CM | POA: Diagnosis not present

## 2015-11-03 DIAGNOSIS — F802 Mixed receptive-expressive language disorder: Secondary | ICD-10-CM | POA: Diagnosis not present

## 2015-11-03 DIAGNOSIS — J168 Pneumonia due to other specified infectious organisms: Secondary | ICD-10-CM | POA: Diagnosis not present

## 2015-11-03 DIAGNOSIS — I69154 Hemiplegia and hemiparesis following nontraumatic intracerebral hemorrhage affecting left non-dominant side: Secondary | ICD-10-CM | POA: Diagnosis not present

## 2015-11-03 DIAGNOSIS — M6281 Muscle weakness (generalized): Secondary | ICD-10-CM | POA: Diagnosis not present

## 2015-11-03 NOTE — Progress Notes (Signed)
Patient ID: [name] [name], male   DOB: 04-Jun-1961, 54 y.o.   MRN: [id number]   [facility]      No Known Allergies  Chief Complaint  Patient presents with  . Acute Visit    follow up chest x-ray     HPI:  He has been having a loose  nonproductive cough and increased shortness of breath. There are no reports of fever present. He continues to participate in therapy. His chest x-ray done demonstrated a right lower lobe infiltrate and vascular crowding.    Past Medical History  Diagnosis Date  . Stroke ([place])   . Hypertension   . Hyperlipidemia   . Diabetes mellitus without complication ([place])   . Fall at home   . Hemiparesis affecting left side as late effect of stroke ([place])   . Dysphagia as late effect of cerebrovascular disease   . Acute respiratory failure ([place])   . Cerebral edema [facility])     Past Surgical History  Procedure Laterality Date  . Gastrostomy tube placement  05-19-15  . Mechanical thrombectomy angioplasty stenet placement    . Esophagogastroduodenoscopy N/A 08/12/2015    Procedure: ESOPHAGOGASTRODUODENOSCOPY (EGD);  Surgeon: Ladene Artist, MD;  Location: [facility] ENDOSCOPY;  Service: Endoscopy;  Laterality: N/A;    VITAL SIGNS BP 149/96 mmHg  Pulse 93  Ht 6' (1.829 m)  Wt 209 lb (94.802 kg)  BMI 28.34 kg/m2  SpO2 93%  Patient's Medications  New Prescriptions   No medications on file  Previous Medications   AMLODIPINE (NORVASC) 10 MG TABLET    Take 10 mg by mouth daily.    ASPIRIN 325 MG TABLET    Take 1 tablet (325 mg total) by mouth daily.   ATORVASTATIN (LIPITOR) 80 MG TABLET    Take 1 tablet (80 mg total) by mouth daily at 6 PM.   CLOPIDOGREL (PLAVIX) 75 MG TABLET    75 mg by Gastrostomy Tube route daily.    DONEPEZIL (ARICEPT) 5 MG TABLET    Take 1 tablet (5 mg total) by mouth at bedtime.   FEEDING SUPPLEMENT, ENSURE ENLIVE, (ENSURE ENLIVE) LIQD    Take 237 mLs by mouth 2 (two) times daily between meals.   FENOFIBRATE 40 MG TABS    Take 40  mg by mouth 2 (two) times daily.   FLUOXETINE (PROZAC) 20 MG CAPSULE    20 mg by Gastrostomy Tube route daily.    HYDRALAZINE (APRESOLINE) 10 MG TABLET    Take 10 mg by mouth every 8 (eight) hours.    HYDROCODONE-ACETAMINOPHEN (NORCO/VICODIN) 5-325 MG TABLET    Take one tablet by mouth every 6 hours as needed for pain. Not to exceed 3061m of APAP from all sources/24h   LISINOPRIL (PRINIVIL,ZESTRIL) 40 MG TABLET    Take 0.5 tablets (20 mg total) by mouth daily. Until seen by PCP.   MALTODEXTRIN-XANTHAN GUM (RESOURCE THICKENUP CLEAR) POWD    Take 3,600 g by mouth as needed.   MULTIPLE VITAMINS TABLET    Take 1 tablet by mouth daily.   PANTOPRAZOLE (PROTONIX) 40 MG TABLET    Take 1 tablet (40 mg total) by mouth 2 (two) times daily.   SENNOSIDES-DOCUSATE SODIUM (SENOKOT-S) 8.6-50 MG TABLET    Take 1 tablet by mouth daily.   Modified Medications   No medications on file  Discontinued Medications   No medications on file     SIGNIFICANT DIAGNOSTIC EXAMS   04-28-15: ct of head: 1. Right medial parietal hematoma, stable.  2. 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 No new hemorrhage   05-08-15: ct of head: 1. Findings consistent with evolution of patchy regions of right MCA infarction, status post thrombectomy and stenting of M1. 2. Negative for hemorrhage  05-08-15: MRI of brain: 1. Findings consistent with large region of acute ischemia int he right MCA distrubution. On evidence of hemorrhage. 2. Interval progression of chronic small vessel ischemic changes.   05-08-15: 2-d echo: mild LV dysfunction EF 50%; with moderate LVH; normal LA pressures with diastolic dysfunction. Normal right ventricular systolic function. Valvular regurgitation: trivial MR; trivial TR; no valvular stenosis.    05-09-15: EKG: sinus bradycardia   05-09-15: chest x-ry: 1. Stable enlarged cardiac and mediastinal contours. 2. Small left pleural effusion with underlying atelectasis   05-09-15: diagnostic  cerebral angiogram: 1. Right common femoral angiogram demonstrates satisfactory placement of sheath within the right common femoral artery, above the common femoral bifurcation and below  the origin of the inferior epigraastric artery with adequate vascular caliber for use of an arterial closure device. Right common carotid angiography demonstrates stable positioning and configuration of right mca enterprise stent device with preserved stent patency an antegrade opacification of distal right mca territory. Stable positioning and configuration of trigt MCA M1 segment enterprise stent device with preserved stent patency and antegrade opacification of right MCA territory   05-11-15: left upper extremity venous ultrasound: negative for dvt  05-28-15: ct of head; 1. Right medial parietal hematoma , stable. 2. findings of right MCA territory laminar necrosis unchanged. No new hemorrhage  07-22-15: chest x-ray: suspect small right basilar infiltrate and effusion. Consider aspiration pneumonitis with gram-negative organism   08-08-15: ct of head: Prior RIGHT MCA territory infarct. Small vessel chronic ischemic changes of deep cerebral white matter. No acute intracranial abnormalities. Sphenoid sinus opacification.  08-08-15: chest x-ray: Persistent asymmetric right lower lung airspace opacity, suspicious for pneumonia  08-12-15: swallow study: Dysphagia 2 (Fine chop);Nectar   08-12-15: upper endoscopy: 1. Erosive gastritis in gastric antrum. 2. non erosive duodenitis int he duodenal bulb. 3. proir G tube site in gastric stomach.   09-19-15: chest x-ray: 1. Atelectasis versus infiltrate right lung base. 2. Low lung volumes with pulmonary vascular crowding versus congestion. 3. Electronic device 4. Calcified aorta. 5. Spondylosis thoracic spine     LABS REVIEWED:   05-08-15: hgb a1c 5.9; chol 253; trig 424; hdl 47  05-26-15: glucose 126; bun 20; creat 1.0; k+4.0; na++ 134 05-28-15: wbc 5.4; hgb 12.0; hct 34.6;  mcv 86; plt 233; glucose 106; bun 26; creat 1.6; k+3.8; na++ 138 05-29-15: wbc 5.7; hgb 12.6; hct 36.6; mcv 86; plt 215;glcuose 156; bun 18; creat 1.1; k+3.8; na++ 135 08-08-15: wbc 13.9; hgb 5.0; hct 15.6;mcv 85.7; plt 368; glucose 126; bun 43; creat 1.1; k+ 3.6; na++140; alk phos 144; albumin 21; guaiac: + 08-09-15: wbc 12.4; hgb 5.7; hct 17.7;mcv 85.5; plt 288; glucose 119; bun 34; creat 0.84; k+ 3.7; na++141   08-10-15: wbc 8.9; hgb 7.1; hct 21.5; mcv 86.3; plt 252; glucose 111; bun 23; creat 0.92; k+ 3.7; na++143 08-12-15: wbc 8.1; hgb 8.4; hct 25.2; mcv 85.1; plt 198; glucose 100; bun 7; creat 0.78; k+ 4.3; na++143 08-14-15; wbc 6.4; hgb 8.5; hct 26.1; mcv 85.9; plt 235; glucose 88; bun 5; creat 0.82; k+ 3.3; na++137 09-06-15: wbc 5.7 hgb 9.0; hct 28.3; mcv 84.0; plt 242; glucose 128; bun 46; creat 5.46; k+ 4.3; na++144; alk phos 162; albumin 3.8     Review  of Systems  Unable to perform ROS: other      Physical Exam Vitals reviewed. Constitutional: No distress.  Obese   Eyes: Conjunctivae are normal.  Neck: Neck supple. No JVD present. No thyromegaly present.  Cardiovascular: Normal rate, regular rhythm and intact distal pulses.   Respiratory: Effort normal and breath sounds normal. No respiratory distress. He has no wheezes.  GI: Soft. Bowel sounds are normal. He exhibits no distension. There is no tenderness.   Musculoskeletal: He exhibits no edema.  Has left hemiparesis with gross movement present  Is able to move right side extremities.   Lymphadenopathy:    He has no cervical adenopathy.  Neurological: He is alert.  Skin: Skin is warm and dry. He is not diaphoretic.  Psychiatric: He has a normal mood and affect.        ASSESSMENT/ PLAN:  1. Pneumonia 2. Pleural effusion 3. Hypertension  Will begin augmentin 875 mg twice daily for 14 days with florastor twice daily for 2 weeks Will give lasix 20 mg daily for 3 days Will increase his clonidine to 0.1 mg every 8  hours Will repeat bmp on Monday   Deborah Green NP [facility]  Contact [phone] Monday through Friday 8am- 5pm  After hours call [phone]

## 2015-11-04 DIAGNOSIS — R1312 Dysphagia, oropharyngeal phase: Secondary | ICD-10-CM | POA: Diagnosis not present

## 2015-11-04 DIAGNOSIS — R2681 Unsteadiness on feet: Secondary | ICD-10-CM | POA: Diagnosis not present

## 2015-11-04 DIAGNOSIS — J168 Pneumonia due to other specified infectious organisms: Secondary | ICD-10-CM | POA: Diagnosis not present

## 2015-11-04 DIAGNOSIS — F802 Mixed receptive-expressive language disorder: Secondary | ICD-10-CM | POA: Diagnosis not present

## 2015-11-04 DIAGNOSIS — I69154 Hemiplegia and hemiparesis following nontraumatic intracerebral hemorrhage affecting left non-dominant side: Secondary | ICD-10-CM | POA: Diagnosis not present

## 2015-11-04 DIAGNOSIS — M6281 Muscle weakness (generalized): Secondary | ICD-10-CM | POA: Diagnosis not present

## 2015-11-05 DIAGNOSIS — R2681 Unsteadiness on feet: Secondary | ICD-10-CM | POA: Diagnosis not present

## 2015-11-05 DIAGNOSIS — R1312 Dysphagia, oropharyngeal phase: Secondary | ICD-10-CM | POA: Diagnosis not present

## 2015-11-05 DIAGNOSIS — M6281 Muscle weakness (generalized): Secondary | ICD-10-CM | POA: Diagnosis not present

## 2015-11-05 DIAGNOSIS — F802 Mixed receptive-expressive language disorder: Secondary | ICD-10-CM | POA: Diagnosis not present

## 2015-11-05 DIAGNOSIS — I69154 Hemiplegia and hemiparesis following nontraumatic intracerebral hemorrhage affecting left non-dominant side: Secondary | ICD-10-CM | POA: Diagnosis not present

## 2015-11-05 DIAGNOSIS — J168 Pneumonia due to other specified infectious organisms: Secondary | ICD-10-CM | POA: Diagnosis not present

## 2015-11-06 ENCOUNTER — Non-Acute Institutional Stay (SKILLED_NURSING_FACILITY): Payer: Medicare Other | Admitting: Internal Medicine

## 2015-11-06 DIAGNOSIS — B9681 Helicobacter pylori [H. pylori] as the cause of diseases classified elsewhere: Secondary | ICD-10-CM | POA: Diagnosis not present

## 2015-11-06 DIAGNOSIS — I69354 Hemiplegia and hemiparesis following cerebral infarction affecting left non-dominant side: Secondary | ICD-10-CM

## 2015-11-06 DIAGNOSIS — F0631 Mood disorder due to known physiological condition with depressive features: Secondary | ICD-10-CM | POA: Diagnosis not present

## 2015-11-06 DIAGNOSIS — K5900 Constipation, unspecified: Secondary | ICD-10-CM | POA: Diagnosis not present

## 2015-11-06 DIAGNOSIS — E785 Hyperlipidemia, unspecified: Secondary | ICD-10-CM | POA: Diagnosis not present

## 2015-11-06 DIAGNOSIS — I69391 Dysphagia following cerebral infarction: Secondary | ICD-10-CM | POA: Diagnosis not present

## 2015-11-06 DIAGNOSIS — E1149 Type 2 diabetes mellitus with other diabetic neurological complication: Secondary | ICD-10-CM

## 2015-11-06 DIAGNOSIS — I10 Essential (primary) hypertension: Secondary | ICD-10-CM

## 2015-11-06 DIAGNOSIS — I639 Cerebral infarction, unspecified: Secondary | ICD-10-CM

## 2015-11-06 DIAGNOSIS — F0391 Unspecified dementia with behavioral disturbance: Secondary | ICD-10-CM | POA: Diagnosis not present

## 2015-11-06 DIAGNOSIS — I69154 Hemiplegia and hemiparesis following nontraumatic intracerebral hemorrhage affecting left non-dominant side: Secondary | ICD-10-CM | POA: Diagnosis not present

## 2015-11-06 DIAGNOSIS — M6281 Muscle weakness (generalized): Secondary | ICD-10-CM | POA: Diagnosis not present

## 2015-11-06 DIAGNOSIS — K279 Peptic ulcer, site unspecified, unspecified as acute or chronic, without hemorrhage or perforation: Secondary | ICD-10-CM | POA: Diagnosis not present

## 2015-11-06 DIAGNOSIS — R1312 Dysphagia, oropharyngeal phase: Secondary | ICD-10-CM | POA: Diagnosis not present

## 2015-11-06 DIAGNOSIS — J168 Pneumonia due to other specified infectious organisms: Secondary | ICD-10-CM | POA: Diagnosis not present

## 2015-11-06 DIAGNOSIS — F802 Mixed receptive-expressive language disorder: Secondary | ICD-10-CM | POA: Diagnosis not present

## 2015-11-06 DIAGNOSIS — IMO0002 Reserved for concepts with insufficient information to code with codable children: Secondary | ICD-10-CM

## 2015-11-06 DIAGNOSIS — K5909 Other constipation: Secondary | ICD-10-CM

## 2015-11-06 DIAGNOSIS — F03918 Unspecified dementia, unspecified severity, with other behavioral disturbance: Secondary | ICD-10-CM

## 2015-11-06 DIAGNOSIS — R2681 Unsteadiness on feet: Secondary | ICD-10-CM | POA: Diagnosis not present

## 2015-11-07 DIAGNOSIS — R1312 Dysphagia, oropharyngeal phase: Secondary | ICD-10-CM | POA: Diagnosis not present

## 2015-11-07 DIAGNOSIS — M6281 Muscle weakness (generalized): Secondary | ICD-10-CM | POA: Diagnosis not present

## 2015-11-07 DIAGNOSIS — R2681 Unsteadiness on feet: Secondary | ICD-10-CM | POA: Diagnosis not present

## 2015-11-07 DIAGNOSIS — I69154 Hemiplegia and hemiparesis following nontraumatic intracerebral hemorrhage affecting left non-dominant side: Secondary | ICD-10-CM | POA: Diagnosis not present

## 2015-11-07 DIAGNOSIS — J168 Pneumonia due to other specified infectious organisms: Secondary | ICD-10-CM | POA: Diagnosis not present

## 2015-11-07 DIAGNOSIS — F802 Mixed receptive-expressive language disorder: Secondary | ICD-10-CM | POA: Diagnosis not present

## 2015-11-08 ENCOUNTER — Encounter: Payer: Self-pay | Admitting: Adult Health

## 2015-11-08 DIAGNOSIS — R2681 Unsteadiness on feet: Secondary | ICD-10-CM | POA: Diagnosis not present

## 2015-11-08 DIAGNOSIS — M6281 Muscle weakness (generalized): Secondary | ICD-10-CM | POA: Diagnosis not present

## 2015-11-08 DIAGNOSIS — I69154 Hemiplegia and hemiparesis following nontraumatic intracerebral hemorrhage affecting left non-dominant side: Secondary | ICD-10-CM | POA: Diagnosis not present

## 2015-11-08 DIAGNOSIS — F802 Mixed receptive-expressive language disorder: Secondary | ICD-10-CM | POA: Diagnosis not present

## 2015-11-08 DIAGNOSIS — J168 Pneumonia due to other specified infectious organisms: Secondary | ICD-10-CM | POA: Diagnosis not present

## 2015-11-08 DIAGNOSIS — R1312 Dysphagia, oropharyngeal phase: Secondary | ICD-10-CM | POA: Diagnosis not present

## 2015-11-08 MED ORDER — DONEPEZIL HCL 10 MG PO TABS: 10.0000 mg | ORAL_TABLET | Freq: Every day | ORAL | Status: DC

## 2015-11-08 NOTE — Progress Notes (Signed)
Patient ID: [name] [name], male   DOB: 1961-01-14, 54 y.o.   MRN: [id number]   [facility]      No Known Allergies  Chief Complaint  Patient presents with  . Medical Management of Chronic Issues    HPI:  He is a long term resident of this facility being seen for the management of his chronic illnesses.  Overall he is doing well.    Past Medical History  Diagnosis Date  . Stroke ([place])   . Hypertension   . Hyperlipidemia   . Diabetes mellitus without complication ([place])   . Fall at home   . Hemiparesis affecting left side as late effect of stroke ([place])   . Dysphagia as late effect of cerebrovascular disease   . Acute respiratory failure ([place])   . Cerebral edema [facility])     Past Surgical History  Procedure Laterality Date  . Gastrostomy tube placement  05-19-15  . Mechanical thrombectomy angioplasty stenet placement    . Esophagogastroduodenoscopy N/A 08/12/2015    Procedure: ESOPHAGOGASTRODUODENOSCOPY (EGD);  Surgeon: Ladene Artist, MD;  Location: [facility] ENDOSCOPY;  Service: Endoscopy;  Laterality: N/A;    VITAL SIGNS BP 140/76 mmHg  Pulse 60  Ht 6' (1.829 m)  Wt 222 lb (100.699 kg)  BMI 30.10 kg/m2  Patient's Medications  New Prescriptions   No medications on file  Previous Medications   Thick it Nectar thick    aricept 5 mg  Take 5 mg nightly    Asa 81 mg  Take 81 mg daily    Clonidine 0.1 mg  Every 8 hours   Fenofibrate 40 mg  Take 40 mg daily    Hydralazine 10 mg  Take 10 mg every 8 hours   vicodin 5/325 mg  Take 1 every 6 hours as needed   lipitor 80 mg  Take 80 mg daily    Lisinopril 20 mg  Take 20 mg daily    MVI Take one daily   norvasc 10 mg  Take 10 mg daily   plavix 75 mg  Take 75 mg daily    protonix 40 mg  Take 40 mg twice daily    prozac 20 mg  Take 20 mg daily    Senna  Take one daily   Modified Medications   No medications on file  Discontinued Medications   No medications on file     SIGNIFICANT DIAGNOSTIC EXAMS   04-28-15:  ct of head: 1. Right medial parietal hematoma, stable. 2. 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 No new hemorrhage   05-08-15: ct of head: 1. Findings consistent with evolution of patchy regions of right MCA infarction, status post thrombectomy and stenting of M1. 2. Negative for hemorrhage  05-08-15: MRI of brain: 1. Findings consistent with large region of acute ischemia int he right MCA distrubution. On evidence of hemorrhage. 2. Interval progression of chronic small vessel ischemic changes.   05-08-15: 2-d echo: mild LV dysfunction EF 50%; with moderate LVH; normal LA pressures with diastolic dysfunction. Normal right ventricular systolic function. Valvular regurgitation: trivial MR; trivial TR; no valvular stenosis.    05-09-15: EKG: sinus bradycardia   05-09-15: chest x-ry: 1. Stable enlarged cardiac and mediastinal contours. 2. Small left pleural effusion with underlying atelectasis   05-09-15: diagnostic cerebral angiogram: 1. Right common femoral angiogram demonstrates satisfactory placement of sheath within the right common femoral artery, above the common femoral bifurcation and below  the origin of the inferior epigraastric  artery with adequate vascular caliber for use of an arterial closure device. Right common carotid angiography demonstrates stable positioning and configuration of right mca enterprise stent device with preserved stent patency an antegrade opacification of distal right mca territory. Stable positioning and configuration of trigt MCA M1 segment enterprise stent device with preserved stent patency and antegrade opacification of right MCA territory   05-11-15: left upper extremity venous ultrasound: negative for dvt  05-28-15: ct of head; 1. Right medial parietal hematoma , stable. 2. findings of right MCA territory laminar necrosis unchanged. No new hemorrhage  07-22-15: chest x-ray: suspect small right basilar infiltrate and effusion. Consider aspiration  pneumonitis with gram-negative organism   08-08-15: ct of head: Prior RIGHT MCA territory infarct. Small vessel chronic ischemic changes of deep cerebral white matter. No acute intracranial abnormalities. Sphenoid sinus opacification.  08-08-15: chest x-ray: Persistent asymmetric right lower lung airspace opacity, suspicious for pneumonia  08-12-15: swallow study: Dysphagia 2 (Fine chop);Nectar   08-12-15: upper endoscopy: 1. Erosive gastritis in gastric antrum. 2. non erosive duodenitis int he duodenal bulb. 3. proir G tube site in gastric stomach.   09-19-15: chest x-ray: 1. Atelectasis versus infiltrate right lung base. 2. Low lung volumes with pulmonary vascular crowding versus congestion. 3. Electronic device 4. Calcified aorta. 5. Spondylosis thoracic spine    09-29-15: chest x-ray: no acute cardiopulmonary disease   LABS REVIEWED:   05-08-15: hgb a1c 5.9; chol 253; trig 424; hdl 47  05-26-15: glucose 126; bun 20; creat 1.0; k+4.0; na++ 134 05-28-15: wbc 5.4; hgb 12.0; hct 34.6; mcv 86; plt 233; glucose 106; bun 26; creat 1.6; k+3.8; na++ 138 05-29-15: wbc 5.7; hgb 12.6; hct 36.6; mcv 86; plt 215;glcuose 156; bun 18; creat 1.1; k+3.8; na++ 135 08-08-15: wbc 13.9; hgb 5.0; hct 15.6;mcv 85.7; plt 368; glucose 126; bun 43; creat 1.1; k+ 3.6; na++140; alk phos 144; albumin 21; guaiac: + 08-09-15: wbc 12.4; hgb 5.7; hct 17.7;mcv 85.5; plt 288; glucose 119; bun 34; creat 0.84; k+ 3.7; na++141   08-10-15: wbc 8.9; hgb 7.1; hct 21.5; mcv 86.3; plt 252; glucose 111; bun 23; creat 0.92; k+ 3.7; na++143 08-12-15: wbc 8.1; hgb 8.4; hct 25.2; mcv 85.1; plt 198; glucose 100; bun 7; creat 0.78; k+ 4.3; na++143 08-14-15; wbc 6.4; hgb 8.5; hct 26.1; mcv 85.9; plt 235; glucose 88; bun 5; creat 0.82; k+ 3.3; na++137 09-06-15: wbc 5.7 hgb 9.0; hct 28.3; mcv 84.0; plt 242; glucose 128; bun 46; creat 5.46; k+ 4.3; na++144; alk phos 162; albumin 3.8  09-20-15: urine culture; no growth 09-22-15: glucose 83; bun 10;  creat 0.95; k+ 4.1; na++140     Review of Systems  Unable to perform ROS: other      Physical Exam Vitals reviewed. Constitutional: No distress.  Obese   Eyes: Conjunctivae are normal.  Neck: Neck supple. No JVD present. No thyromegaly present.  Cardiovascular: Normal rate, regular rhythm and intact distal pulses.   Respiratory: Effort normal and breath sounds normal. No respiratory distress. He has no wheezes.  GI: Soft. Bowel sounds are normal. He exhibits no distension. There is no tenderness.   Musculoskeletal: He exhibits no edema.  Has left hemiparesis with gross movement present  Is able to move right side extremities.   Lymphadenopathy:    He has no cervical adenopathy.  Neurological: He is alert.  Skin: Skin is warm and dry. He is not diaphoretic.  Psychiatric: He has a normal mood and affect.        ASSESSMENT/  PLAN:   1.  Acute cva with left hemiparesis: is neurologically stable; will continue asa 81 mg daily; and plavix 75 mg daily  will monitor his status.   2. Hypertension: will continue norvasc 10 mg daily; lisinopril 20 mg daily clonidine 0.1 mg every 8 hours hydralazine 10 mg every 8 hours    3. Dyslipidemia: will continue lipitor 80 mg daily and will begin fenofibrate 40 mg daily trig 424 and total cholesterol 253    4. Diabetes with neurological effects: his hgb a1c is 5.9; diet controlled;  5. Constipation: will continue senna  daily  6. Depression: will continue prozac 20 mg daily will monitor his status.   7. Dysphagia due to cva: no signs of aspiration present;is on nectar thick liquids; peg tube has been removed.   8. Hpylori: has completed abt; will continue protonix 40 mg twice daily   9. Vascular dementia: will increase aricept to 10 mg nightly   Will stop the MVI       Ok Edwards NP [facility]  Contact [phone] Monday through Friday 8am- 5pm  After hours call [phone]

## 2015-11-10 DIAGNOSIS — R1312 Dysphagia, oropharyngeal phase: Secondary | ICD-10-CM | POA: Diagnosis not present

## 2015-11-10 DIAGNOSIS — J168 Pneumonia due to other specified infectious organisms: Secondary | ICD-10-CM | POA: Diagnosis not present

## 2015-11-10 DIAGNOSIS — R41841 Cognitive communication deficit: Secondary | ICD-10-CM | POA: Diagnosis not present

## 2015-11-10 DIAGNOSIS — I69154 Hemiplegia and hemiparesis following nontraumatic intracerebral hemorrhage affecting left non-dominant side: Secondary | ICD-10-CM | POA: Diagnosis not present

## 2015-11-10 DIAGNOSIS — R2681 Unsteadiness on feet: Secondary | ICD-10-CM | POA: Diagnosis not present

## 2015-11-10 DIAGNOSIS — M6281 Muscle weakness (generalized): Secondary | ICD-10-CM | POA: Diagnosis not present

## 2015-11-10 DIAGNOSIS — F802 Mixed receptive-expressive language disorder: Secondary | ICD-10-CM | POA: Diagnosis not present

## 2015-11-11 ENCOUNTER — Encounter: Payer: Self-pay | Admitting: Internal Medicine

## 2015-11-11 NOTE — Progress Notes (Signed)
Patient ID: [name] [name], male   DOB: [birth year], 55 y.o.   MRN: [id number]    DATE: 11/06/15  Location:  [facility]    Place of Service: SNF [phone])   Extended Emergency Contact Information Primary Emergency Contact: [place] Mobile Phone: [phone] Relation: Daughter Secondary Emergency Contact: Hasan,Wilfred  [place] Phone: [phone] Relation: Other  Advanced Directive information  FULL CODE  Chief Complaint  Patient presents with  . Discharge Note    HPI:  55 yo male seen today for d/c from SNF following short term rehab for CVA with left hemiparesis, GI bleed with H pylori ulcer, UTI and presumed HCAP. He was readmitted at least 3 times since original admission. He has tolerated PT and is walking better with assistance. No rectal bleeding. No f/c. No urinary c/o. He will going home to live with his daughter. He will need HH PT/OT/ST and nursing. He will require DME (4 prong cane)  Hypertension - BP stable on 4 drug tx (norvasc, lisinopril, clonidine and hydralazine)  Dyslipidemia- stable on lipitor and fenofibrate. TG 424; total chol 253    DM - diet controlled. Last a1c is 5.9%. He has neuropathy and takes norco prn  Depression - mood stable on prozac   Hx CVA with dysphagia and hemiparesis -  no signs of aspiration present. on nectar thick liquids. Takes ASA/plavix  H pylori infection - completed abx. Takes protonix BID. Takes senna for constipation  Vascular dementia - on aricept    Past Medical History  Diagnosis Date  . Stroke ([place])   . Hypertension   . Hyperlipidemia   . Diabetes mellitus without complication ([place])   . Fall at home   . Hemiparesis affecting left side as late effect of stroke ([place])   . Dysphagia as late effect of cerebrovascular disease   . Acute respiratory failure ([place])   . Cerebral edema [facility])     Past Surgical History  Procedure Laterality Date  .  Gastrostomy tube placement  05-19-15  . Mechanical thrombectomy angioplasty stenet placement    . Esophagogastroduodenoscopy N/A 08/12/2015    Procedure: ESOPHAGOGASTRODUODENOSCOPY (EGD);  Surgeon: Ladene Artist, MD;  Location: [facility] ENDOSCOPY;  Service: Endoscopy;  Laterality: N/A;    Patient Care Team: Gildardo Cranker, DO as PCP - General (Internal Medicine) Gerlene Fee, NP as Nurse Practitioner (Geriatric Medicine) [place] ([place])  Social History   Social History  . Marital Status: Single    Spouse Name: N/A  . Number of Children: N/A  . Years of Education: N/A   Occupational History  . Not on file.   Social History Main Topics  . Smoking status: Former Smoker    Types: Cigarettes  . Smokeless tobacco: Not on file  . Alcohol Use: No  . Drug Use: Not on file  . Sexual Activity: Not on file   Other Topics Concern  . Not on file   Social History Narrative     reports that he has quit smoking. His smoking use included Cigarettes. He does not have any smokeless tobacco history on file. He reports that he does not drink alcohol. His drug history is not on file.  Immunization History  Administered Date(s) Administered  . Influenza,inj,Quad PF,36+ Mos 08/10/2015    No Known Allergies  Medications: Patient's Medications  New Prescriptions   No medications on file  Previous Medications   AMLODIPINE (NORVASC) 10  MG TABLET    Take 10 mg by mouth daily.    ASPIRIN 325 MG TABLET    Take 1 tablet (325 mg total) by mouth daily.   ATORVASTATIN (LIPITOR) 80 MG TABLET    Take 1 tablet (80 mg total) by mouth daily at 6 PM.   CLOPIDOGREL (PLAVIX) 75 MG TABLET    75 mg by Gastrostomy Tube route daily.    DONEPEZIL (ARICEPT) 10 MG TABLET    Take 1 tablet (10 mg total) by mouth at bedtime.   FEEDING SUPPLEMENT, ENSURE ENLIVE, (ENSURE ENLIVE) LIQD    Take 237 mLs by mouth 2 (two) times daily between meals.   FENOFIBRATE 40 MG TABS    Take  40 mg by mouth 2 (two) times daily.   FLUOXETINE (PROZAC) 20 MG CAPSULE    20 mg by Gastrostomy Tube route daily.    HYDRALAZINE (APRESOLINE) 10 MG TABLET    Take 10 mg by mouth every 8 (eight) hours.    HYDROCODONE-ACETAMINOPHEN (NORCO/VICODIN) 5-325 MG TABLET    Take one tablet by mouth every 6 hours as needed for pain. Not to exceed 3000mg  of APAP from all sources/24h   LISINOPRIL (PRINIVIL,ZESTRIL) 40 MG TABLET    Take 0.5 tablets (20 mg total) by mouth daily. Until seen by PCP.   MALTODEXTRIN-XANTHAN GUM (RESOURCE THICKENUP CLEAR) POWD    Take 3,600 g by mouth as needed.   PANTOPRAZOLE (PROTONIX) 40 MG TABLET    Take 1 tablet (40 mg total) by mouth 2 (two) times daily.   SENNOSIDES-DOCUSATE SODIUM (SENOKOT-S) 8.6-50 MG TABLET    Take 1 tablet by mouth daily.   Modified Medications   No medications on file  Discontinued Medications   No medications on file    Review of Systems  Unable to perform ROS: Dementia    Filed Vitals:   11/06/15 1418  BP: 141/70  Pulse: 48  Temp: 98.2 F (36.8 C)  Weight: 223 lb (101.152 kg)   Body mass index is 30.24 kg/(m^2).  Physical Exam  Constitutional: He appears well-developed and well-nourished. No distress.  Musculoskeletal: He exhibits edema.  Neurological: He is alert. Gait abnormal.  Left hemiparesis. Expressive aphasia   Skin: Skin is warm and dry. No rash noted.  Psychiatric: He has a normal mood and affect. His behavior is normal.     Labs reviewed: Admission on 09/07/2015, Discharged on 09/12/2015  No results displayed because visit has over 200 results.    Admission on 08/08/2015, Discharged on 08/14/2015  No results displayed because visit has over 200 results.      Dg Op Swallowing Func-medicare/speech Path  10/30/2015  CLINICAL DATA:  Dysphagia reassessment. EXAM: MODIFIED BARIUM SWALLOW TECHNIQUE: Different consistencies of barium were administered orally to the patient by the Speech Pathologist. Imaging of the  pharynx was performed in the lateral projection. FLUOROSCOPY TIME:  Radiation Exposure Index (as provided by the fluoroscopic device): If the device does not provide the exposure index: Fluoroscopy Time:  1 minute and 11 seconds Number of Acquired Images:  Fluoroscopic study COMPARISON:  Prior examination 07/27/2015. FINDINGS: Thin liquid- mildly delayed oral phase. With multiple swallows, only occasional flash penetration occurred. There was no aspiration. Mild retention in the vallecula. Nectar thick liquid- within normal limits Honey- not administered Pure- within normal limits Pure with cracker- within normal limits Barium tablet -  within normal limits IMPRESSION: Interval improved swallowing function with minimal oral pharyngeal delay and occasional flash penetration with thin barium only. No aspiration.  Please refer to the Speech Pathologists report for complete details and recommendations. Electronically Signed   By: Richardean Sale M.D.   On: 10/30/2015 11:43 Objective Swallowing Evaluation:   Patient Details Name: ACHYUTH WILBOURNE MRN: [id number] Date of Birth: November 02, 1961 Today's Date: 10/30/2015 Time: SLP Start Time (ACUTE ONLY): 1042-SLP Stop Time (ACUTE ONLY): 1059 SLP Time Calculation (min) (ACUTE ONLY): 17 min Past Medical History: Past Medical History Diagnosis Date . Stroke ([place])  . Hypertension  . Hyperlipidemia  . Diabetes mellitus without complication ([place])  . Fall at home  . Hemiparesis affecting left side as late effect of stroke ([place])  . Dysphagia as late effect of cerebrovascular disease  . Acute respiratory failure ([place])  . Cerebral edema [facility])  Past Surgical History: Past Surgical History Procedure Laterality Date . Gastrostomy tube placement  05-19-15 . Mechanical thrombectomy angioplasty stenet placement   . Esophagogastroduodenoscopy N/A 08/12/2015   Procedure: ESOPHAGOGASTRODUODENOSCOPY (EGD);  Surgeon: Ladene Artist, MD;  Location: [facility] ENDOSCOPY;  Service: Endoscopy;  Laterality: N/A;  HPI: 55 yo male with PMH of dysphagia s/p CVA, with most recent MBS in 08/2015 recommending Dys 2 diet and nectar thick liquids with multiple swallows. Subjective: pt says he has been working with SLP at [facility] and has been doing well with trials of water Assessment / Plan / Recommendation CHL IP CLINICAL IMPRESSIONS 10/30/2015 Therapy Diagnosis Mild oral phase dysphagia;Mild pharyngeal phase dysphagia Clinical Impression Pt has a mild oropharyngeal dysphagia with delay in swallow initiation to the pyriform sinuses with thin liquids and the valleculae with solids. Despite his impaired timing, pt does not have any penetration or aspiration across challenging. He does have reduced ability to posteriorly transfer a barium tablet, with both liquid and pureed washes, ultimately have to expectorate the pill. Recommend advancing diet to Dys 3 (mechanical soft) and thin liquids, but would cotninue administering medications crushed in puree. Impact on safety and function Mild aspiration risk   CHL IP TREATMENT RECOMMENDATION 10/30/2015 Treatment Recommendations Defer treatment plan to f/u with SLP   Prognosis 08/12/2015 Prognosis for Safe Diet Advancement Good Barriers to Reach Goals Severity of deficits Barriers/Prognosis Comment -- CHL IP DIET RECOMMENDATION 10/30/2015 SLP Diet Recommendations Dysphagia 3 (Mech soft) solids;Thin liquid Liquid Administration via Cup;Straw Medication Administration Crushed with puree Compensations Slow rate;Minimize environmental distractions;Small sips/bites Postural Changes Seated upright at 90 degrees   CHL IP OTHER RECOMMENDATIONS 10/30/2015 Recommended Consults -- Oral Care Recommendations Oral care BID Other Recommendations --   CHL IP FOLLOW UP RECOMMENDATIONS 08/14/2015 Follow up Recommendations Skilled Nursing facility   [facility] IP FREQUENCY AND DURATION 08/12/2015 Speech Therapy Frequency (ACUTE ONLY) min 2x/week Treatment Duration 2 weeks      CHL IP ORAL PHASE 10/30/2015 Oral Phase  Impaired Oral - Pudding Teaspoon -- Oral - Pudding Cup -- Oral - Honey Teaspoon -- Oral - Honey Cup -- Oral - Nectar Teaspoon -- Oral - Nectar Cup -- Oral - Nectar Straw -- Oral - Thin Teaspoon -- Oral - Thin Cup WFL Oral - Thin Straw WFL Oral - Puree WFL Oral - Mech Soft WFL Oral - Regular -- Oral - Multi-Consistency -- Oral - Pill Reduced posterior propulsion;Other (Comment) Oral Phase - Comment --  CHL IP PHARYNGEAL PHASE 10/30/2015 Pharyngeal Phase Impaired Pharyngeal- Pudding Teaspoon -- Pharyngeal -- Pharyngeal- Pudding Cup -- Pharyngeal -- Pharyngeal- Honey Teaspoon -- Pharyngeal -- Pharyngeal- Honey Cup -- Pharyngeal -- Pharyngeal- Nectar Teaspoon -- Pharyngeal -- Pharyngeal- Nectar Cup -- Pharyngeal -- Pharyngeal- Nectar Straw -- Pharyngeal --  Pharyngeal- Thin Teaspoon -- Pharyngeal -- Pharyngeal- Thin Cup Delayed swallow initiation-pyriform sinuses Pharyngeal -- Pharyngeal- Thin Straw Delayed swallow initiation-pyriform sinuses Pharyngeal -- Pharyngeal- Puree Delayed swallow initiation-vallecula Pharyngeal -- Pharyngeal- Mechanical Soft Delayed swallow initiation-vallecula Pharyngeal -- Pharyngeal- Regular -- Pharyngeal -- Pharyngeal- Multi-consistency -- Pharyngeal -- Pharyngeal- Pill -- Pharyngeal -- Pharyngeal Comment --  CHL IP CERVICAL ESOPHAGEAL PHASE 10/30/2015 Cervical Esophageal Phase WFL Pudding Teaspoon -- Pudding Cup -- Honey Teaspoon -- Honey Cup -- Nectar Teaspoon -- Nectar Cup -- Nectar Straw -- Thin Teaspoon -- Thin Cup -- Thin Straw -- Puree -- Mechanical Soft -- Regular -- Multi-consistency -- Pill -- Cervical Esophageal Comment -- Germain Osgood, M.A. CCC-SLP [phone] Germain Osgood 10/30/2015, 11:57 AM                Assessment/Plan   ICD-9-CM ICD-10-CM   1. Hemiparesis affecting left side as late effect of stroke (HCC) 438.20 I69.354   2. Dysphagia as late effect of stroke 438.82 I69.391   3. Type II diabetes mellitus with neurological manifestations ([place]) 250.60  E11.49   4. Chronic constipation 564.00 K59.00   5. Depression due to stroke ([place]) 311 I63.9    434.91 F06.31   6. Essential hypertension 401.9 I10   7. Dyslipidemia 272.4 E78.5   8. Dementia with behavioral disturbance 294.21 F03.91   9. H pylori ulcer 533.90 K27.9    041.86 B96.81     Patient is being discharged with home health services:  PT/OT/ST/Nursing  Patient is being discharged with the following durable medical equipment:  4 prong cane  Patient has been advised to f/u with their PCP in 1-2 weeks to bring them up to date on their rehab stay.  They were provided with a 30 day supply of scripts for prescription medications and refills must be obtained from their PCP.  TIME SPENT (MINUTES): [place]. Perlie Gold  [facility] and [facility] [address] [place], [place] [address] [phone] Cell (Monday-Friday 8 AM - 5 PM) [phone] After 5 PM and follow prompts

## 2015-11-14 DIAGNOSIS — I69111 Memory deficit following nontraumatic intracerebral hemorrhage: Secondary | ICD-10-CM | POA: Diagnosis not present

## 2015-11-14 DIAGNOSIS — I10 Essential (primary) hypertension: Secondary | ICD-10-CM | POA: Diagnosis not present

## 2015-11-14 DIAGNOSIS — Z8701 Personal history of pneumonia (recurrent): Secondary | ICD-10-CM | POA: Diagnosis not present

## 2015-11-14 DIAGNOSIS — E785 Hyperlipidemia, unspecified: Secondary | ICD-10-CM | POA: Diagnosis not present

## 2015-11-14 DIAGNOSIS — I69122 Dysarthria following nontraumatic intracerebral hemorrhage: Secondary | ICD-10-CM | POA: Diagnosis not present

## 2015-11-14 DIAGNOSIS — Z7982 Long term (current) use of aspirin: Secondary | ICD-10-CM | POA: Diagnosis not present

## 2015-11-14 DIAGNOSIS — I69191 Dysphagia following nontraumatic intracerebral hemorrhage: Secondary | ICD-10-CM | POA: Diagnosis not present

## 2015-11-14 DIAGNOSIS — E119 Type 2 diabetes mellitus without complications: Secondary | ICD-10-CM | POA: Diagnosis not present

## 2015-11-14 DIAGNOSIS — Z9181 History of falling: Secondary | ICD-10-CM | POA: Diagnosis not present

## 2015-11-14 DIAGNOSIS — I69154 Hemiplegia and hemiparesis following nontraumatic intracerebral hemorrhage affecting left non-dominant side: Secondary | ICD-10-CM | POA: Diagnosis not present

## 2015-11-14 DIAGNOSIS — K219 Gastro-esophageal reflux disease without esophagitis: Secondary | ICD-10-CM | POA: Diagnosis not present

## 2015-11-17 DIAGNOSIS — I69122 Dysarthria following nontraumatic intracerebral hemorrhage: Secondary | ICD-10-CM | POA: Diagnosis not present

## 2015-11-17 DIAGNOSIS — E119 Type 2 diabetes mellitus without complications: Secondary | ICD-10-CM | POA: Diagnosis not present

## 2015-11-17 DIAGNOSIS — I10 Essential (primary) hypertension: Secondary | ICD-10-CM | POA: Diagnosis not present

## 2015-11-17 DIAGNOSIS — I69191 Dysphagia following nontraumatic intracerebral hemorrhage: Secondary | ICD-10-CM | POA: Diagnosis not present

## 2015-11-17 DIAGNOSIS — I69154 Hemiplegia and hemiparesis following nontraumatic intracerebral hemorrhage affecting left non-dominant side: Secondary | ICD-10-CM | POA: Diagnosis not present

## 2015-11-17 DIAGNOSIS — I69111 Memory deficit following nontraumatic intracerebral hemorrhage: Secondary | ICD-10-CM | POA: Diagnosis not present

## 2015-11-18 DIAGNOSIS — I69111 Memory deficit following nontraumatic intracerebral hemorrhage: Secondary | ICD-10-CM | POA: Diagnosis not present

## 2015-11-18 DIAGNOSIS — I69122 Dysarthria following nontraumatic intracerebral hemorrhage: Secondary | ICD-10-CM | POA: Diagnosis not present

## 2015-11-18 DIAGNOSIS — E119 Type 2 diabetes mellitus without complications: Secondary | ICD-10-CM | POA: Diagnosis not present

## 2015-11-18 DIAGNOSIS — I10 Essential (primary) hypertension: Secondary | ICD-10-CM | POA: Diagnosis not present

## 2015-11-18 DIAGNOSIS — I69154 Hemiplegia and hemiparesis following nontraumatic intracerebral hemorrhage affecting left non-dominant side: Secondary | ICD-10-CM | POA: Diagnosis not present

## 2015-11-18 DIAGNOSIS — I69191 Dysphagia following nontraumatic intracerebral hemorrhage: Secondary | ICD-10-CM | POA: Diagnosis not present

## 2015-11-19 DIAGNOSIS — I69154 Hemiplegia and hemiparesis following nontraumatic intracerebral hemorrhage affecting left non-dominant side: Secondary | ICD-10-CM | POA: Diagnosis not present

## 2015-11-19 DIAGNOSIS — I69191 Dysphagia following nontraumatic intracerebral hemorrhage: Secondary | ICD-10-CM | POA: Diagnosis not present

## 2015-11-19 DIAGNOSIS — I10 Essential (primary) hypertension: Secondary | ICD-10-CM | POA: Diagnosis not present

## 2015-11-19 DIAGNOSIS — I69111 Memory deficit following nontraumatic intracerebral hemorrhage: Secondary | ICD-10-CM | POA: Diagnosis not present

## 2015-11-19 DIAGNOSIS — E119 Type 2 diabetes mellitus without complications: Secondary | ICD-10-CM | POA: Diagnosis not present

## 2015-11-19 DIAGNOSIS — I69122 Dysarthria following nontraumatic intracerebral hemorrhage: Secondary | ICD-10-CM | POA: Diagnosis not present

## 2015-11-20 DIAGNOSIS — I69122 Dysarthria following nontraumatic intracerebral hemorrhage: Secondary | ICD-10-CM | POA: Diagnosis not present

## 2015-11-20 DIAGNOSIS — I69111 Memory deficit following nontraumatic intracerebral hemorrhage: Secondary | ICD-10-CM | POA: Diagnosis not present

## 2015-11-20 DIAGNOSIS — I10 Essential (primary) hypertension: Secondary | ICD-10-CM | POA: Diagnosis not present

## 2015-11-20 DIAGNOSIS — I69191 Dysphagia following nontraumatic intracerebral hemorrhage: Secondary | ICD-10-CM | POA: Diagnosis not present

## 2015-11-20 DIAGNOSIS — E119 Type 2 diabetes mellitus without complications: Secondary | ICD-10-CM | POA: Diagnosis not present

## 2015-11-20 DIAGNOSIS — I69154 Hemiplegia and hemiparesis following nontraumatic intracerebral hemorrhage affecting left non-dominant side: Secondary | ICD-10-CM | POA: Diagnosis not present

## 2015-11-24 DIAGNOSIS — I10 Essential (primary) hypertension: Secondary | ICD-10-CM | POA: Diagnosis not present

## 2015-11-24 DIAGNOSIS — E119 Type 2 diabetes mellitus without complications: Secondary | ICD-10-CM | POA: Diagnosis not present

## 2015-11-24 DIAGNOSIS — I69191 Dysphagia following nontraumatic intracerebral hemorrhage: Secondary | ICD-10-CM | POA: Diagnosis not present

## 2015-11-24 DIAGNOSIS — I69111 Memory deficit following nontraumatic intracerebral hemorrhage: Secondary | ICD-10-CM | POA: Diagnosis not present

## 2015-11-24 DIAGNOSIS — I69122 Dysarthria following nontraumatic intracerebral hemorrhage: Secondary | ICD-10-CM | POA: Diagnosis not present

## 2015-11-24 DIAGNOSIS — I69154 Hemiplegia and hemiparesis following nontraumatic intracerebral hemorrhage affecting left non-dominant side: Secondary | ICD-10-CM | POA: Diagnosis not present

## 2015-11-26 DIAGNOSIS — I69111 Memory deficit following nontraumatic intracerebral hemorrhage: Secondary | ICD-10-CM | POA: Diagnosis not present

## 2015-11-26 DIAGNOSIS — I69122 Dysarthria following nontraumatic intracerebral hemorrhage: Secondary | ICD-10-CM | POA: Diagnosis not present

## 2015-11-26 DIAGNOSIS — E119 Type 2 diabetes mellitus without complications: Secondary | ICD-10-CM | POA: Diagnosis not present

## 2015-11-26 DIAGNOSIS — I10 Essential (primary) hypertension: Secondary | ICD-10-CM | POA: Diagnosis not present

## 2015-11-26 DIAGNOSIS — I69154 Hemiplegia and hemiparesis following nontraumatic intracerebral hemorrhage affecting left non-dominant side: Secondary | ICD-10-CM | POA: Diagnosis not present

## 2015-11-26 DIAGNOSIS — I69191 Dysphagia following nontraumatic intracerebral hemorrhage: Secondary | ICD-10-CM | POA: Diagnosis not present

## 2015-11-27 DIAGNOSIS — I69122 Dysarthria following nontraumatic intracerebral hemorrhage: Secondary | ICD-10-CM | POA: Diagnosis not present

## 2015-11-27 DIAGNOSIS — I10 Essential (primary) hypertension: Secondary | ICD-10-CM | POA: Diagnosis not present

## 2015-11-27 DIAGNOSIS — I69154 Hemiplegia and hemiparesis following nontraumatic intracerebral hemorrhage affecting left non-dominant side: Secondary | ICD-10-CM | POA: Diagnosis not present

## 2015-11-27 DIAGNOSIS — E119 Type 2 diabetes mellitus without complications: Secondary | ICD-10-CM | POA: Diagnosis not present

## 2015-11-27 DIAGNOSIS — I69191 Dysphagia following nontraumatic intracerebral hemorrhage: Secondary | ICD-10-CM | POA: Diagnosis not present

## 2015-11-27 DIAGNOSIS — I69111 Memory deficit following nontraumatic intracerebral hemorrhage: Secondary | ICD-10-CM | POA: Diagnosis not present

## 2015-11-28 DIAGNOSIS — I10 Essential (primary) hypertension: Secondary | ICD-10-CM | POA: Diagnosis not present

## 2015-11-28 DIAGNOSIS — I69191 Dysphagia following nontraumatic intracerebral hemorrhage: Secondary | ICD-10-CM | POA: Diagnosis not present

## 2015-11-28 DIAGNOSIS — I69154 Hemiplegia and hemiparesis following nontraumatic intracerebral hemorrhage affecting left non-dominant side: Secondary | ICD-10-CM | POA: Diagnosis not present

## 2015-11-28 DIAGNOSIS — E119 Type 2 diabetes mellitus without complications: Secondary | ICD-10-CM | POA: Diagnosis not present

## 2015-11-28 DIAGNOSIS — I69122 Dysarthria following nontraumatic intracerebral hemorrhage: Secondary | ICD-10-CM | POA: Diagnosis not present

## 2015-11-28 DIAGNOSIS — I69111 Memory deficit following nontraumatic intracerebral hemorrhage: Secondary | ICD-10-CM | POA: Diagnosis not present

## 2015-12-01 ENCOUNTER — Encounter: Payer: Self-pay | Admitting: Internal Medicine

## 2015-12-01 DIAGNOSIS — I69191 Dysphagia following nontraumatic intracerebral hemorrhage: Secondary | ICD-10-CM | POA: Diagnosis not present

## 2015-12-01 DIAGNOSIS — I69122 Dysarthria following nontraumatic intracerebral hemorrhage: Secondary | ICD-10-CM | POA: Diagnosis not present

## 2015-12-01 DIAGNOSIS — I69111 Memory deficit following nontraumatic intracerebral hemorrhage: Secondary | ICD-10-CM | POA: Diagnosis not present

## 2015-12-01 DIAGNOSIS — E119 Type 2 diabetes mellitus without complications: Secondary | ICD-10-CM | POA: Diagnosis not present

## 2015-12-01 DIAGNOSIS — I10 Essential (primary) hypertension: Secondary | ICD-10-CM | POA: Diagnosis not present

## 2015-12-01 DIAGNOSIS — I69154 Hemiplegia and hemiparesis following nontraumatic intracerebral hemorrhage affecting left non-dominant side: Secondary | ICD-10-CM | POA: Diagnosis not present

## 2015-12-01 NOTE — Progress Notes (Signed)
Patient ID: [name], male   DOB: 1961/10/15, 55 y.o.   MRN: [id number]    HISTORY AND PHYSICAL   DATE: 06/03/15  Location:  [facility]    Place of Service: SNF [phone])   Extended Emergency Contact Information Primary Emergency Contact: [place] Mobile Phone: [phone] Relation: Daughter Secondary Emergency Contact: Gren,Demarious  [place] Phone: [phone] Relation: Other  Advanced Directive information  FULL CODE  Chief Complaint  Patient presents with  . New Admit To SNF    HPI:  55 yo male seen today as a new admission into SNF following hospital stay at [facility] for acute CVA with large right MCA ischemic infarct, malignant HTN, dysphagia due to CVA s/p Peg, tobacco abuse, prolonged QTc, hyperlipidemia, DM2. 2D echo EF 50% with moderate LVH and negative bubble. LDL not calculated due to triglycerides >400. A1c 5.9%. TSH 2.2. Peg placed due to moderate -severe dysphagia. He was d/c'd to SNF for short term rehab  He c/o trouble swallowing and that food "goes straight through me". No other concerns. No nursing issues. No falls. He does have previous hx stroke. He smokes cigs  Hx CVA with left hemiparesis - stable on ASA, Plavix. He takes amantadine for abnormal movements   Hypertension - stable on norvasc, hctz, lisinopril, minoxidil . SBP 140-160   Dyslipidemia - not at goal on lipitor 80 mg and fenofibrate was started on yesterdya  DM w neuropathy - diet controlled. [id number]  Constipation - takes senna  Depression - mood stable on prozac   Dysphagia due to CVA - s/p Peg and is on TF at night to supplement day meals. Diet is mechanical soft with honey thick liquids. ST following   Past Medical History  Diagnosis Date  . Stroke ([place])   . Hypertension   . Hyperlipidemia   . Diabetes mellitus without complication ([place])   . Fall at home   . Hemiparesis affecting left side as late  effect of stroke ([place])   . Dysphagia as late effect of cerebrovascular disease   . Acute respiratory failure ([place])   . Cerebral edema [facility])     Past Surgical History  Procedure Laterality Date  . Gastrostomy tube placement  05-19-15  . Mechanical thrombectomy angioplasty stenet placement    . Esophagogastroduodenoscopy N/A 08/12/2015    Procedure: ESOPHAGOGASTRODUODENOSCOPY (EGD);  Surgeon: Ladene Artist, MD;  Location: [facility] ENDOSCOPY;  Service: Endoscopy;  Laterality: N/A;    Patient Care Team: Gildardo Cranker, DO as PCP - General (Internal Medicine) Gerlene Fee, NP as Nurse Practitioner (Geriatric Medicine) [place] ([place])  Social History   Social History  . Marital Status: Single    Spouse Name: N/A  . Number of Children: N/A  . Years of Education: N/A   Occupational History  . Not on file.   Social History Main Topics  . Smoking status: Former Smoker    Types: Cigarettes  . Smokeless tobacco: Not on file  . Alcohol Use: No  . Drug Use: Not on file  . Sexual Activity: Not on file   Other Topics Concern  . Not on file   Social History Narrative     reports that he has quit smoking. His smoking use included Cigarettes. He does not have any smokeless tobacco history on file. He reports that he does not drink alcohol. His drug history is not on file.  Family History  Problem Relation Age of Onset  . Family history unknown: Yes   No family status information on file.    Immunization History  Administered Date(s) Administered  . Influenza,inj,Quad PF,36+ Mos 08/10/2015    No Known Allergies  Medications: Patient's Medications  New Prescriptions   No medications on file  Previous Medications   AMLODIPINE (NORVASC) 10 MG TABLET    Take 10 mg by mouth daily.    ASPIRIN 325 MG TABLET    Take 1 tablet (325 mg total) by mouth daily.   ATORVASTATIN (LIPITOR) 80 MG TABLET    Take 1 tablet (80 mg total) by mouth daily  at 6 PM.   CLOPIDOGREL (PLAVIX) 75 MG TABLET    75 mg by Gastrostomy Tube route daily.    DONEPEZIL (ARICEPT) 10 MG TABLET    Take 1 tablet (10 mg total) by mouth at bedtime.   FEEDING SUPPLEMENT, ENSURE ENLIVE, (ENSURE ENLIVE) LIQD    Take 237 mLs by mouth 2 (two) times daily between meals.   FENOFIBRATE 40 MG TABS    Take 40 mg by mouth 2 (two) times daily.   FLUOXETINE (PROZAC) 20 MG CAPSULE    20 mg by Gastrostomy Tube route daily.    HYDRALAZINE (APRESOLINE) 10 MG TABLET    Take 10 mg by mouth every 8 (eight) hours.    HYDROCODONE-ACETAMINOPHEN (NORCO/VICODIN) 5-325 MG TABLET    Take one tablet by mouth every 6 hours as needed for pain. Not to exceed 3000mg  of APAP from all sources/24h   LISINOPRIL (PRINIVIL,ZESTRIL) 40 MG TABLET    Take 0.5 tablets (20 mg total) by mouth daily. Until seen by PCP.   MALTODEXTRIN-XANTHAN GUM (RESOURCE THICKENUP CLEAR) POWD    Take 3,600 g by mouth as needed.   PANTOPRAZOLE (PROTONIX) 40 MG TABLET    Take 1 tablet (40 mg total) by mouth 2 (two) times daily.   SENNOSIDES-DOCUSATE SODIUM (SENOKOT-S) 8.6-50 MG TABLET    Take 1 tablet by mouth daily.   Modified Medications   No medications on file  Discontinued Medications   No medications on file    Review of Systems  Unable to perform ROS: Other    Filed Vitals:   06/03/15 2257  BP: 142/76  Pulse: 79  Temp: 97.5 F (36.4 C)  Weight: 259 lb (117.482 kg)   Body mass index is 35.12 kg/(m^2).  Physical Exam  Constitutional: He appears well-developed and well-nourished.  Sitting in w/c in NAD  HENT:  Mouth/Throat: Oropharynx is clear and moist.  Eyes: Pupils are equal, round, and reactive to light. No scleral icterus.  Neck: Neck supple. Carotid bruit is not present. No thyromegaly present.  Cardiovascular: Normal rate, regular rhythm, normal heart sounds and intact distal pulses.  Exam reveals no gallop and no friction rub.   No murmur heard. no distal LE swelling. No calf TTP  Pulmonary/Chest:  Effort normal and breath sounds normal. He has no wheezes. He has no rales. He exhibits no tenderness.  Abdominal: Soft. Bowel sounds are normal. He exhibits no distension, no abdominal bruit, no pulsatile midline mass and no mass. There is no tenderness. There is no rebound and no guarding.  Peg tube intact and no d/c or redness at insertion site  Musculoskeletal: He exhibits edema.  LUE>LLE swelling; +2/5 strength in upper/lower extremities  Lymphadenopathy:    He has no cervical adenopathy.  Neurological: He is alert.  Skin: Skin is warm and dry. No rash noted.  Psychiatric: His behavior is  normal. Thought content normal. His speech is slurred. He exhibits a depressed mood.     Labs reviewed: No results found for any previous visit.  No results found.   Assessment/Plan   ICD-9-CM ICD-10-CM   1. Hemiparesis due to recent stroke [facility]) - left sided 438.20 I69.959   2. Essential hypertension, malignant - improving 401.0 I10   3. Dyslipidemia - uncontrolled 272.4 E78.5   4. Type II diabetes mellitus with neurological manifestations ([place]) 250.60 E11.40   5. Dysphagia as late effect of stroke 438.82 I69.391   6. PEG (percutaneous endoscopic gastrostomy) status ([place]) due to #5 V44.1 Z93.1   7. Depression due to stroke ([place]) 311 F32.9    434.91 I63.50   8. Tobacco abuse - hx 305.1 Z72.0   9. Prolonged QT interval 794.31 I45.81      Cont current meds as ordered  TF as ordered qHS  Peg care as indicated  Cont PT/OT/St as ordered  CBGs daily and record  F/u with neurology as scheduled  GOAL: short term rehab and d/c home when medically appropriate. Communicated with pt and nursing.  Will follow  Cara Aguino S. Perlie Gold  [facility] and Adult Medicine [address] [place], [place] [address] [phone] Cell (Monday-Friday 8 AM - 5 PM) [phone] After 5 PM and follow prompts

## 2015-12-02 DIAGNOSIS — I69111 Memory deficit following nontraumatic intracerebral hemorrhage: Secondary | ICD-10-CM | POA: Diagnosis not present

## 2015-12-02 DIAGNOSIS — I10 Essential (primary) hypertension: Secondary | ICD-10-CM | POA: Diagnosis not present

## 2015-12-02 DIAGNOSIS — I69154 Hemiplegia and hemiparesis following nontraumatic intracerebral hemorrhage affecting left non-dominant side: Secondary | ICD-10-CM | POA: Diagnosis not present

## 2015-12-02 DIAGNOSIS — I69122 Dysarthria following nontraumatic intracerebral hemorrhage: Secondary | ICD-10-CM | POA: Diagnosis not present

## 2015-12-02 DIAGNOSIS — E119 Type 2 diabetes mellitus without complications: Secondary | ICD-10-CM | POA: Diagnosis not present

## 2015-12-02 DIAGNOSIS — I69191 Dysphagia following nontraumatic intracerebral hemorrhage: Secondary | ICD-10-CM | POA: Diagnosis not present

## 2015-12-03 DIAGNOSIS — I10 Essential (primary) hypertension: Secondary | ICD-10-CM | POA: Diagnosis not present

## 2015-12-03 DIAGNOSIS — I69122 Dysarthria following nontraumatic intracerebral hemorrhage: Secondary | ICD-10-CM | POA: Diagnosis not present

## 2015-12-03 DIAGNOSIS — I69154 Hemiplegia and hemiparesis following nontraumatic intracerebral hemorrhage affecting left non-dominant side: Secondary | ICD-10-CM | POA: Diagnosis not present

## 2015-12-03 DIAGNOSIS — I69191 Dysphagia following nontraumatic intracerebral hemorrhage: Secondary | ICD-10-CM | POA: Diagnosis not present

## 2015-12-03 DIAGNOSIS — I69111 Memory deficit following nontraumatic intracerebral hemorrhage: Secondary | ICD-10-CM | POA: Diagnosis not present

## 2015-12-03 DIAGNOSIS — E119 Type 2 diabetes mellitus without complications: Secondary | ICD-10-CM | POA: Diagnosis not present

## 2015-12-04 DIAGNOSIS — I69154 Hemiplegia and hemiparesis following nontraumatic intracerebral hemorrhage affecting left non-dominant side: Secondary | ICD-10-CM | POA: Diagnosis not present

## 2015-12-04 DIAGNOSIS — I69191 Dysphagia following nontraumatic intracerebral hemorrhage: Secondary | ICD-10-CM | POA: Diagnosis not present

## 2015-12-04 DIAGNOSIS — I69122 Dysarthria following nontraumatic intracerebral hemorrhage: Secondary | ICD-10-CM | POA: Diagnosis not present

## 2015-12-04 DIAGNOSIS — E119 Type 2 diabetes mellitus without complications: Secondary | ICD-10-CM | POA: Diagnosis not present

## 2015-12-04 DIAGNOSIS — I10 Essential (primary) hypertension: Secondary | ICD-10-CM | POA: Diagnosis not present

## 2015-12-04 DIAGNOSIS — I69111 Memory deficit following nontraumatic intracerebral hemorrhage: Secondary | ICD-10-CM | POA: Diagnosis not present

## 2015-12-05 DIAGNOSIS — I69154 Hemiplegia and hemiparesis following nontraumatic intracerebral hemorrhage affecting left non-dominant side: Secondary | ICD-10-CM | POA: Diagnosis not present

## 2015-12-05 DIAGNOSIS — I69191 Dysphagia following nontraumatic intracerebral hemorrhage: Secondary | ICD-10-CM | POA: Diagnosis not present

## 2015-12-05 DIAGNOSIS — E119 Type 2 diabetes mellitus without complications: Secondary | ICD-10-CM | POA: Diagnosis not present

## 2015-12-05 DIAGNOSIS — I10 Essential (primary) hypertension: Secondary | ICD-10-CM | POA: Diagnosis not present

## 2015-12-05 DIAGNOSIS — I69122 Dysarthria following nontraumatic intracerebral hemorrhage: Secondary | ICD-10-CM | POA: Diagnosis not present

## 2015-12-05 DIAGNOSIS — I69111 Memory deficit following nontraumatic intracerebral hemorrhage: Secondary | ICD-10-CM | POA: Diagnosis not present

## 2015-12-08 DIAGNOSIS — I69122 Dysarthria following nontraumatic intracerebral hemorrhage: Secondary | ICD-10-CM | POA: Diagnosis not present

## 2015-12-08 DIAGNOSIS — I69191 Dysphagia following nontraumatic intracerebral hemorrhage: Secondary | ICD-10-CM | POA: Diagnosis not present

## 2015-12-08 DIAGNOSIS — I10 Essential (primary) hypertension: Secondary | ICD-10-CM | POA: Diagnosis not present

## 2015-12-08 DIAGNOSIS — E119 Type 2 diabetes mellitus without complications: Secondary | ICD-10-CM | POA: Diagnosis not present

## 2015-12-08 DIAGNOSIS — I69154 Hemiplegia and hemiparesis following nontraumatic intracerebral hemorrhage affecting left non-dominant side: Secondary | ICD-10-CM | POA: Diagnosis not present

## 2015-12-08 DIAGNOSIS — I69111 Memory deficit following nontraumatic intracerebral hemorrhage: Secondary | ICD-10-CM | POA: Diagnosis not present

## 2015-12-09 ENCOUNTER — Other Ambulatory Visit: Payer: Self-pay | Admitting: Internal Medicine

## 2015-12-09 DIAGNOSIS — I69191 Dysphagia following nontraumatic intracerebral hemorrhage: Secondary | ICD-10-CM | POA: Diagnosis not present

## 2015-12-09 DIAGNOSIS — I10 Essential (primary) hypertension: Secondary | ICD-10-CM | POA: Diagnosis not present

## 2015-12-09 DIAGNOSIS — I69154 Hemiplegia and hemiparesis following nontraumatic intracerebral hemorrhage affecting left non-dominant side: Secondary | ICD-10-CM | POA: Diagnosis not present

## 2015-12-09 DIAGNOSIS — I69122 Dysarthria following nontraumatic intracerebral hemorrhage: Secondary | ICD-10-CM | POA: Diagnosis not present

## 2015-12-09 DIAGNOSIS — E119 Type 2 diabetes mellitus without complications: Secondary | ICD-10-CM | POA: Diagnosis not present

## 2015-12-09 DIAGNOSIS — I69111 Memory deficit following nontraumatic intracerebral hemorrhage: Secondary | ICD-10-CM | POA: Diagnosis not present

## 2015-12-10 DIAGNOSIS — I69111 Memory deficit following nontraumatic intracerebral hemorrhage: Secondary | ICD-10-CM | POA: Diagnosis not present

## 2015-12-10 DIAGNOSIS — I69191 Dysphagia following nontraumatic intracerebral hemorrhage: Secondary | ICD-10-CM | POA: Diagnosis not present

## 2015-12-10 DIAGNOSIS — I69122 Dysarthria following nontraumatic intracerebral hemorrhage: Secondary | ICD-10-CM | POA: Diagnosis not present

## 2015-12-10 DIAGNOSIS — I69154 Hemiplegia and hemiparesis following nontraumatic intracerebral hemorrhage affecting left non-dominant side: Secondary | ICD-10-CM | POA: Diagnosis not present

## 2015-12-10 DIAGNOSIS — E119 Type 2 diabetes mellitus without complications: Secondary | ICD-10-CM | POA: Diagnosis not present

## 2015-12-10 DIAGNOSIS — I10 Essential (primary) hypertension: Secondary | ICD-10-CM | POA: Diagnosis not present

## 2015-12-11 DIAGNOSIS — I69111 Memory deficit following nontraumatic intracerebral hemorrhage: Secondary | ICD-10-CM | POA: Diagnosis not present

## 2015-12-11 DIAGNOSIS — I69191 Dysphagia following nontraumatic intracerebral hemorrhage: Secondary | ICD-10-CM | POA: Diagnosis not present

## 2015-12-11 DIAGNOSIS — E119 Type 2 diabetes mellitus without complications: Secondary | ICD-10-CM | POA: Diagnosis not present

## 2015-12-11 DIAGNOSIS — I69122 Dysarthria following nontraumatic intracerebral hemorrhage: Secondary | ICD-10-CM | POA: Diagnosis not present

## 2015-12-11 DIAGNOSIS — I10 Essential (primary) hypertension: Secondary | ICD-10-CM | POA: Diagnosis not present

## 2015-12-11 DIAGNOSIS — I69154 Hemiplegia and hemiparesis following nontraumatic intracerebral hemorrhage affecting left non-dominant side: Secondary | ICD-10-CM | POA: Diagnosis not present

## 2015-12-12 ENCOUNTER — Ambulatory Visit: Payer: Medicare Other | Admitting: Internal Medicine

## 2015-12-17 DIAGNOSIS — I69154 Hemiplegia and hemiparesis following nontraumatic intracerebral hemorrhage affecting left non-dominant side: Secondary | ICD-10-CM | POA: Diagnosis not present

## 2015-12-17 DIAGNOSIS — E119 Type 2 diabetes mellitus without complications: Secondary | ICD-10-CM | POA: Diagnosis not present

## 2015-12-17 DIAGNOSIS — I69191 Dysphagia following nontraumatic intracerebral hemorrhage: Secondary | ICD-10-CM | POA: Diagnosis not present

## 2015-12-17 DIAGNOSIS — I69122 Dysarthria following nontraumatic intracerebral hemorrhage: Secondary | ICD-10-CM | POA: Diagnosis not present

## 2015-12-17 DIAGNOSIS — I10 Essential (primary) hypertension: Secondary | ICD-10-CM | POA: Diagnosis not present

## 2015-12-17 DIAGNOSIS — I69111 Memory deficit following nontraumatic intracerebral hemorrhage: Secondary | ICD-10-CM | POA: Diagnosis not present

## 2015-12-18 DIAGNOSIS — I69111 Memory deficit following nontraumatic intracerebral hemorrhage: Secondary | ICD-10-CM | POA: Diagnosis not present

## 2015-12-18 DIAGNOSIS — E119 Type 2 diabetes mellitus without complications: Secondary | ICD-10-CM | POA: Diagnosis not present

## 2015-12-18 DIAGNOSIS — I10 Essential (primary) hypertension: Secondary | ICD-10-CM | POA: Diagnosis not present

## 2015-12-18 DIAGNOSIS — I69122 Dysarthria following nontraumatic intracerebral hemorrhage: Secondary | ICD-10-CM | POA: Diagnosis not present

## 2015-12-18 DIAGNOSIS — I69154 Hemiplegia and hemiparesis following nontraumatic intracerebral hemorrhage affecting left non-dominant side: Secondary | ICD-10-CM | POA: Diagnosis not present

## 2015-12-18 DIAGNOSIS — I69191 Dysphagia following nontraumatic intracerebral hemorrhage: Secondary | ICD-10-CM | POA: Diagnosis not present

## 2015-12-24 DIAGNOSIS — I69154 Hemiplegia and hemiparesis following nontraumatic intracerebral hemorrhage affecting left non-dominant side: Secondary | ICD-10-CM | POA: Diagnosis not present

## 2015-12-24 DIAGNOSIS — I69122 Dysarthria following nontraumatic intracerebral hemorrhage: Secondary | ICD-10-CM | POA: Diagnosis not present

## 2015-12-24 DIAGNOSIS — I69111 Memory deficit following nontraumatic intracerebral hemorrhage: Secondary | ICD-10-CM | POA: Diagnosis not present

## 2015-12-24 DIAGNOSIS — E119 Type 2 diabetes mellitus without complications: Secondary | ICD-10-CM | POA: Diagnosis not present

## 2015-12-24 DIAGNOSIS — I10 Essential (primary) hypertension: Secondary | ICD-10-CM | POA: Diagnosis not present

## 2015-12-24 DIAGNOSIS — I69191 Dysphagia following nontraumatic intracerebral hemorrhage: Secondary | ICD-10-CM | POA: Diagnosis not present

## 2015-12-25 DIAGNOSIS — E119 Type 2 diabetes mellitus without complications: Secondary | ICD-10-CM | POA: Diagnosis not present

## 2015-12-25 DIAGNOSIS — I69154 Hemiplegia and hemiparesis following nontraumatic intracerebral hemorrhage affecting left non-dominant side: Secondary | ICD-10-CM | POA: Diagnosis not present

## 2015-12-25 DIAGNOSIS — I69191 Dysphagia following nontraumatic intracerebral hemorrhage: Secondary | ICD-10-CM | POA: Diagnosis not present

## 2015-12-25 DIAGNOSIS — I10 Essential (primary) hypertension: Secondary | ICD-10-CM | POA: Diagnosis not present

## 2015-12-25 DIAGNOSIS — I69122 Dysarthria following nontraumatic intracerebral hemorrhage: Secondary | ICD-10-CM | POA: Diagnosis not present

## 2015-12-25 DIAGNOSIS — I69111 Memory deficit following nontraumatic intracerebral hemorrhage: Secondary | ICD-10-CM | POA: Diagnosis not present

## 2015-12-26 DIAGNOSIS — I69154 Hemiplegia and hemiparesis following nontraumatic intracerebral hemorrhage affecting left non-dominant side: Secondary | ICD-10-CM | POA: Diagnosis not present

## 2015-12-26 DIAGNOSIS — I69111 Memory deficit following nontraumatic intracerebral hemorrhage: Secondary | ICD-10-CM | POA: Diagnosis not present

## 2015-12-26 DIAGNOSIS — I69191 Dysphagia following nontraumatic intracerebral hemorrhage: Secondary | ICD-10-CM | POA: Diagnosis not present

## 2015-12-26 DIAGNOSIS — E119 Type 2 diabetes mellitus without complications: Secondary | ICD-10-CM | POA: Diagnosis not present

## 2015-12-26 DIAGNOSIS — I69122 Dysarthria following nontraumatic intracerebral hemorrhage: Secondary | ICD-10-CM | POA: Diagnosis not present

## 2015-12-26 DIAGNOSIS — I10 Essential (primary) hypertension: Secondary | ICD-10-CM | POA: Diagnosis not present

## 2016-01-01 DIAGNOSIS — E119 Type 2 diabetes mellitus without complications: Secondary | ICD-10-CM | POA: Diagnosis not present

## 2016-01-01 DIAGNOSIS — I69154 Hemiplegia and hemiparesis following nontraumatic intracerebral hemorrhage affecting left non-dominant side: Secondary | ICD-10-CM | POA: Diagnosis not present

## 2016-01-01 DIAGNOSIS — I10 Essential (primary) hypertension: Secondary | ICD-10-CM | POA: Diagnosis not present

## 2016-01-01 DIAGNOSIS — I69122 Dysarthria following nontraumatic intracerebral hemorrhage: Secondary | ICD-10-CM | POA: Diagnosis not present

## 2016-01-01 DIAGNOSIS — I69111 Memory deficit following nontraumatic intracerebral hemorrhage: Secondary | ICD-10-CM | POA: Diagnosis not present

## 2016-01-01 DIAGNOSIS — I69191 Dysphagia following nontraumatic intracerebral hemorrhage: Secondary | ICD-10-CM | POA: Diagnosis not present

## 2016-01-06 DIAGNOSIS — I69191 Dysphagia following nontraumatic intracerebral hemorrhage: Secondary | ICD-10-CM | POA: Diagnosis not present

## 2016-01-06 DIAGNOSIS — I10 Essential (primary) hypertension: Secondary | ICD-10-CM | POA: Diagnosis not present

## 2016-01-06 DIAGNOSIS — E119 Type 2 diabetes mellitus without complications: Secondary | ICD-10-CM | POA: Diagnosis not present

## 2016-01-06 DIAGNOSIS — I69154 Hemiplegia and hemiparesis following nontraumatic intracerebral hemorrhage affecting left non-dominant side: Secondary | ICD-10-CM | POA: Diagnosis not present

## 2016-01-06 DIAGNOSIS — I69111 Memory deficit following nontraumatic intracerebral hemorrhage: Secondary | ICD-10-CM | POA: Diagnosis not present

## 2016-01-06 DIAGNOSIS — I69122 Dysarthria following nontraumatic intracerebral hemorrhage: Secondary | ICD-10-CM | POA: Diagnosis not present

## 2016-01-11 ENCOUNTER — Other Ambulatory Visit: Payer: Self-pay | Admitting: Internal Medicine

## 2016-01-15 ENCOUNTER — Encounter: Payer: Self-pay | Admitting: Internal Medicine

## 2016-01-20 IMAGING — CT CT HEAD W/O CM
3 of 5 series · 17 of 30 positions shown, 19 images · non-contrast
Comparison: 08/08/2015

CLINICAL DATA: ALTERED MENTAL STATUS, NURSING FACILITY GIVES HX OF
2 FALLS YESTERDAY, LESS ORIENTED TODAY, HX DIABETES, HTN, CVA WITH
LEFT SIDE HEMIPARESIS, DYSPHASIA, RECENT HOSPITALIZATION FOR GI
BLEED, PLAVIX VERMA

EXAM:
CT HEAD WITHOUT CONTRAST
TECHNIQUE: Contiguous axial images were obtained from the base of the skull
through the vertex without intravenous contrast. Continued patient
motion required rescanning x2.

[Series 2: head 5.0 h30s · axial · 0.46mm/px · z∈[-93,+12]mm · 6 of 31 slices shown, 8 images]
[im 5/31  brain]
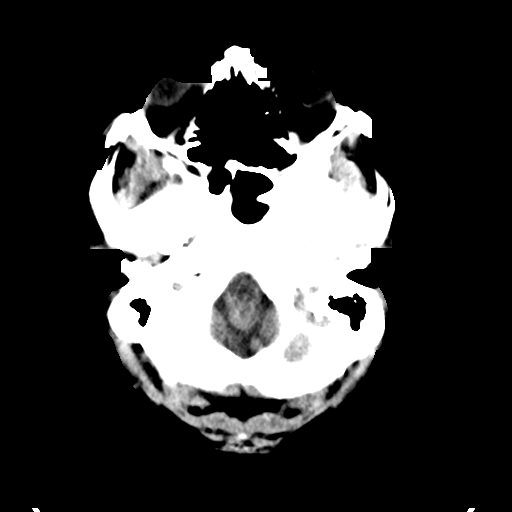
[im 5/31  bone]
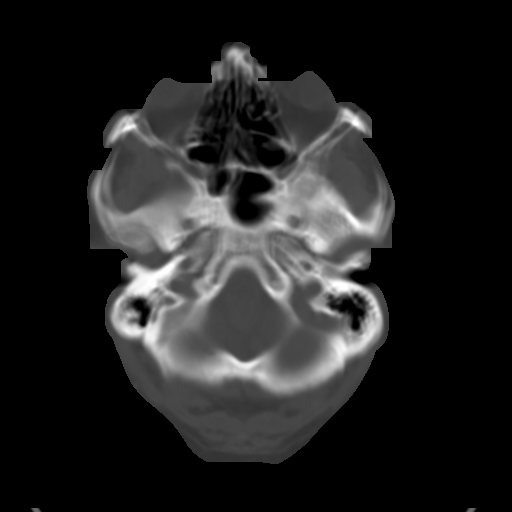
[im 9/31  brain]
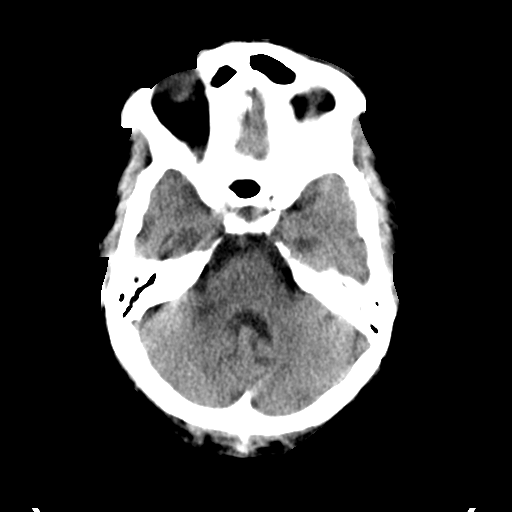
[im 13/31  brain]
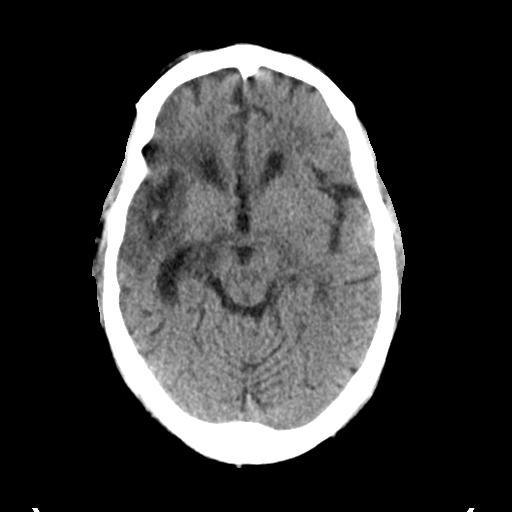
[im 18/31  brain]
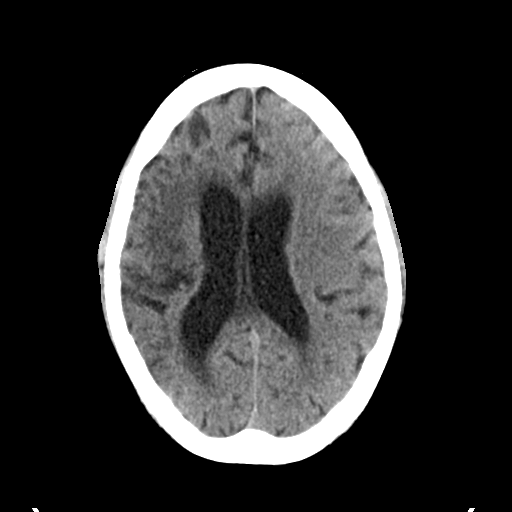
[im 22/31  brain]
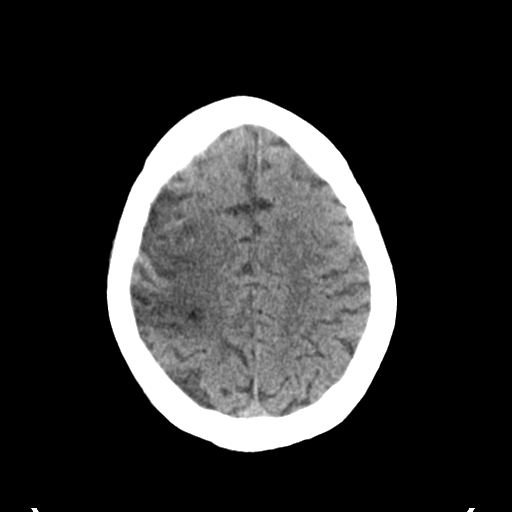
[im 22/31  bone]
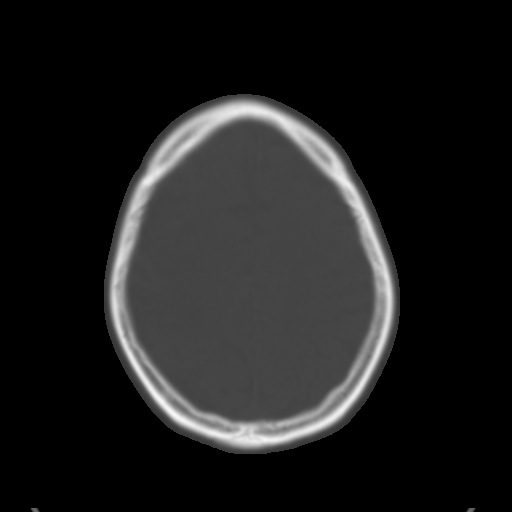
[im 26/31  brain]
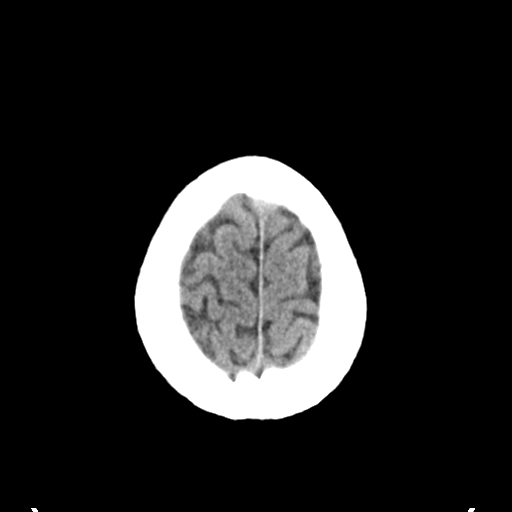

[Series 5: head 2.0 h70h · axial · 0.46mm/px · z∈[-105,-65]mm · 6 of 30 slices shown (1 of 2)]
[im 5/30  brain]
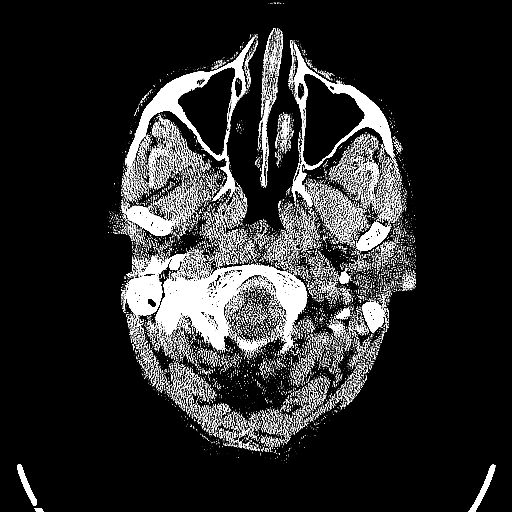
[im 9/30  brain]
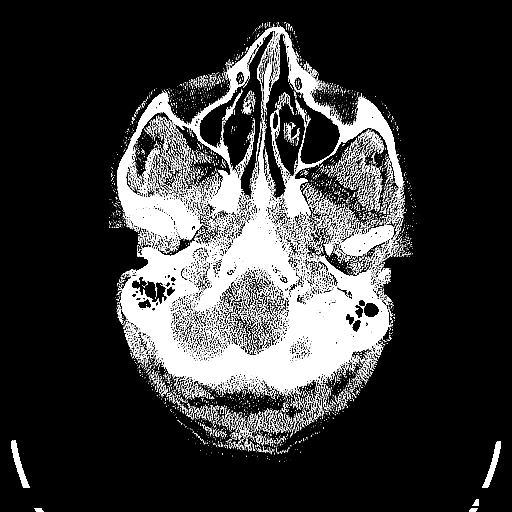
[im 13/30  brain]
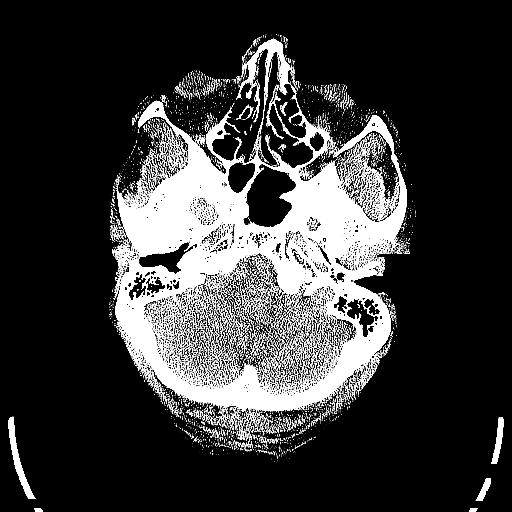
[im 17/30  brain]
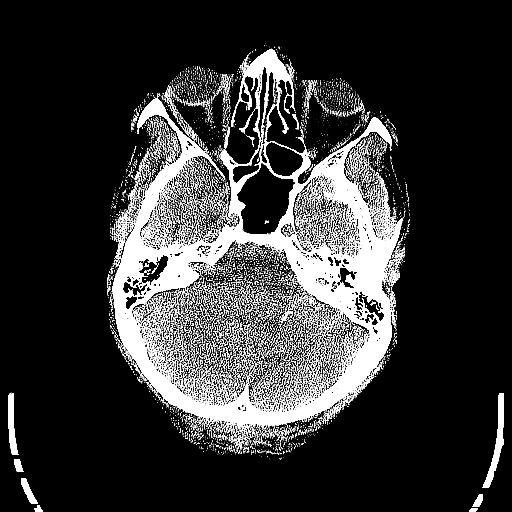
[im 21/30  brain]
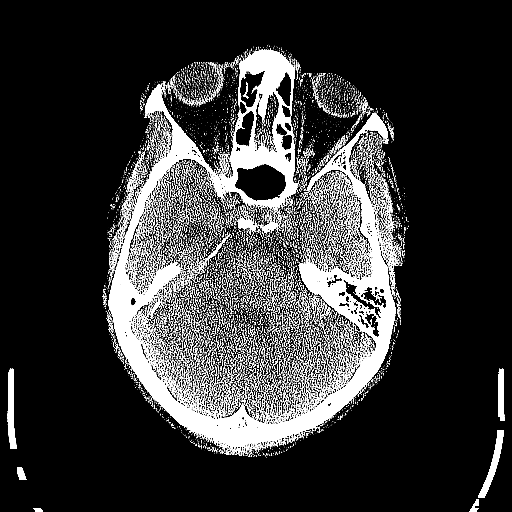
[im 25/30  brain]
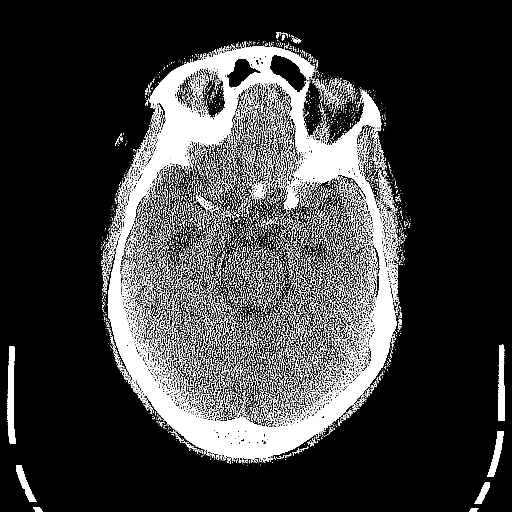

[Series 7: head 2.0 h70h · axial · 0.46mm/px · z∈[-64,-32]mm · 5 of 30 slices shown (2 of 2)]
[im 5/30  brain]
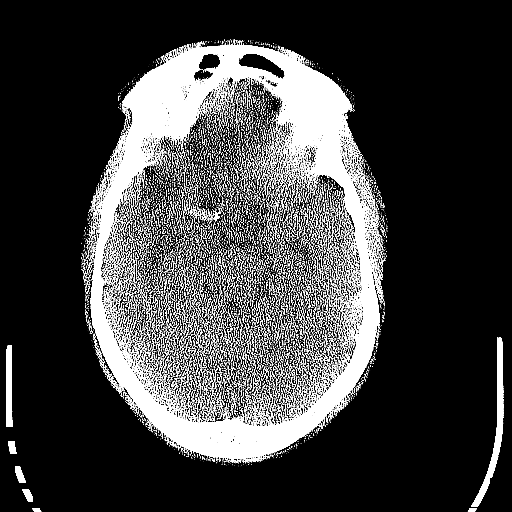
[im 9/30  brain]
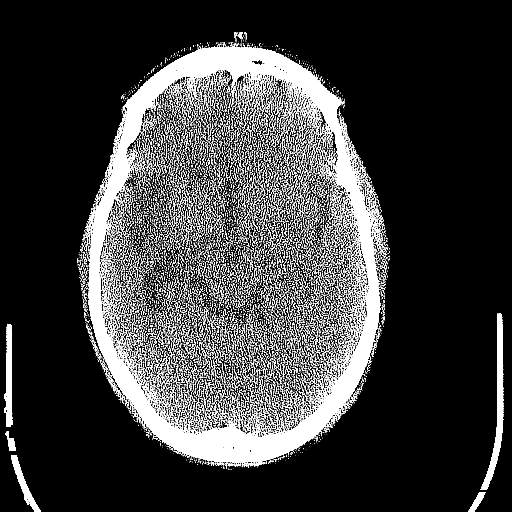
[im 13/30  brain]
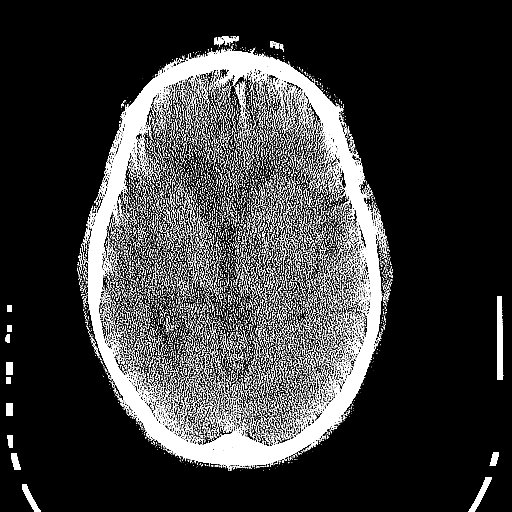
[im 17/30  brain]
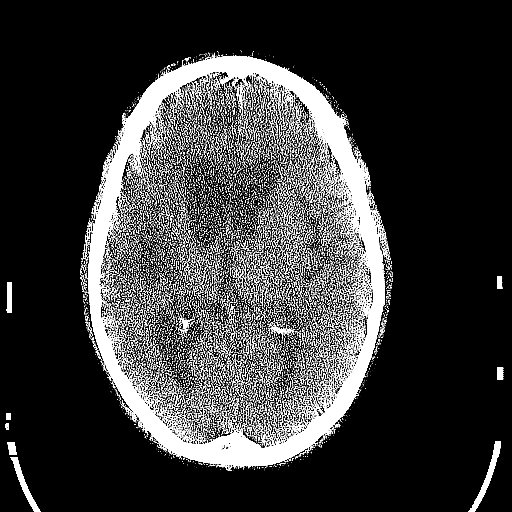
[im 21/30  brain]
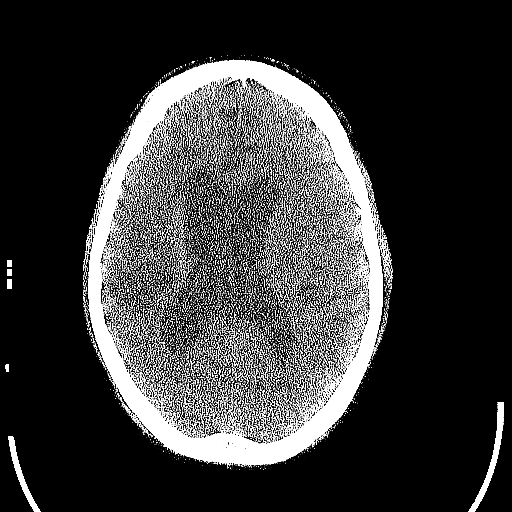

[17 of 30 positions shown; findings below may reference images not displayed]

FINDINGS: Right MCA stent. Atherosclerotic and physiologic intracranial
calcifications. Old right MCA distribution infarct with
encephalomalacia. No acute intracranial hemorrhage, midline shift,
new parenchymal edema, mass or mass effect. There is moderate
atrophy with prominence of ventricles and sulci which remain
relatively symmetric. Acute infarct may be inapparent on noncontrast
CT. CT bone windows reveal no calvarial lesion.
IMPRESSION: 1. Negative for bleed or other acute intracranial process.
2. Stable old right MCA distribution infarct without hemorrhagic
change.

## 2016-01-23 ENCOUNTER — Ambulatory Visit: Payer: Medicare Other | Admitting: Internal Medicine

## 2016-01-28 ENCOUNTER — Encounter: Payer: Self-pay | Admitting: Internal Medicine

## 2016-01-28 ENCOUNTER — Ambulatory Visit (INDEPENDENT_AMBULATORY_CARE_PROVIDER_SITE_OTHER): Payer: Medicare Other | Admitting: Internal Medicine

## 2016-01-28 VITALS — BP 146/90 | HR 62 | Temp 98.0°F | Resp 20 | Ht 74.0 in | Wt 250.4 lb

## 2016-01-28 DIAGNOSIS — E1149 Type 2 diabetes mellitus with other diabetic neurological complication: Secondary | ICD-10-CM | POA: Diagnosis not present

## 2016-01-28 DIAGNOSIS — K5909 Other constipation: Secondary | ICD-10-CM

## 2016-01-28 DIAGNOSIS — I639 Cerebral infarction, unspecified: Secondary | ICD-10-CM

## 2016-01-28 DIAGNOSIS — F0631 Mood disorder due to known physiological condition with depressive features: Secondary | ICD-10-CM

## 2016-01-28 DIAGNOSIS — I6930 Unspecified sequelae of cerebral infarction: Secondary | ICD-10-CM

## 2016-01-28 DIAGNOSIS — F0390 Unspecified dementia without behavioral disturbance: Secondary | ICD-10-CM | POA: Diagnosis not present

## 2016-01-28 DIAGNOSIS — I69354 Hemiplegia and hemiparesis following cerebral infarction affecting left non-dominant side: Secondary | ICD-10-CM

## 2016-01-28 DIAGNOSIS — K5900 Constipation, unspecified: Secondary | ICD-10-CM

## 2016-01-28 DIAGNOSIS — I10 Essential (primary) hypertension: Secondary | ICD-10-CM | POA: Diagnosis not present

## 2016-01-28 DIAGNOSIS — E785 Hyperlipidemia, unspecified: Secondary | ICD-10-CM

## 2016-01-28 DIAGNOSIS — IMO0002 Reserved for concepts with insufficient information to code with codable children: Secondary | ICD-10-CM

## 2016-01-28 MED ORDER — FLUOXETINE HCL 10 MG PO CAPS: 10.0000 mg | ORAL_CAPSULE | Freq: Every day | ORAL | Status: DC

## 2016-01-28 NOTE — Patient Instructions (Signed)
Reduce prozac to 10mg  daily x 2 weeks then will taper off to every other day x 2 weeks then 3 times per week x 2 weeks then 2 times per week  2 weeks and stop  Continue other medications as ordered  Will call with lab results  Recommend road test prior to driving again  Follow up in 3 mos

## 2016-01-28 NOTE — Progress Notes (Signed)
Patient ID: [name] [name], male   DOB: 15-Feb-1961, 55 y.o.   MRN: [id number]    Location:    [facility]   Place of Service:  OFFICE    Advanced Directive information Does patient have an advance directive?: No, Would patient like information on creating an advanced directive?: Yes - Educational materials given  Chief Complaint  Patient presents with  . Establish Care    New Patient Establish Care  . Medical Management of Chronic Issues  . Immunizations    will take pneumonia shot  . OTHER    Daughter in room with patient    HPI:  55 yo male seen today as a new pt. He is a poor historian due to dementia. Hx obtained from chart. Daughter present  He was hospitalized last year and spent several mos at SNF following short term rehab for CVA with left hemiparesis, GI bleed with H pylori ulcer, UTI and presumed HCAP. He was readmitted at least 3 times since original admission. No rectal bleeding. No f/c. No urinary c/o. He lives with his daughter. He completed PT/OT/ST and was released from care. He uses cane to ambulate most times. He would like to resume driving. No falls   Hypertension - BP stable on 4 drug tx (norvasc, lisinopril, clonidine and hydralazine)  Dyslipidemia- stable on lipitor and fenofibrate. TG 424; total chol 253    DM - diet controlled. Last a1c is 5.9%. Neuropathy resolved  Depression - mood much improved on prozac. He reports no further depression and would like to stop med   Hx CVA with dysphagia and left hemiparesis -  no signs of aspiration present. He does not require nectar thick liquids anymore. Peg tube removed prior to d/c from SNF. Takes ASA/plavix. No neuropathy  H pylori infection - completed abx. Takes protonix BID. Takes senna for constipation  Vascular dementia - on aricept   Past Medical History  Diagnosis Date  . Stroke ([place]) 2016  . Hypertension   . Hyperlipidemia   . Diabetes mellitus without complication ([place])   . Fall at home   .  Hemiparesis affecting left side as late effect of stroke ([place])   . Dysphagia as late effect of cerebrovascular disease   . Acute respiratory failure ([place])   . Cerebral edema [facility])     Past Surgical History  Procedure Laterality Date  . Gastrostomy tube placement  05-19-15  . Mechanical thrombectomy angioplasty stenet placement    . Esophagogastroduodenoscopy N/A 08/12/2015    Procedure: ESOPHAGOGASTRODUODENOSCOPY (EGD);  Surgeon: Ladene Artist, MD;  Location: [facility] ENDOSCOPY;  Service: Endoscopy;  Laterality: N/A;    Patient Care Team: Gildardo Cranker, DO as PCP - General (Internal Medicine) Gerlene Fee, NP as Nurse Practitioner (Geriatric Medicine) [place] ([place])  Social History   Social History  . Marital Status: Single    Spouse Name: N/A  . Number of Children: N/A  . Years of Education: N/A   Occupational History  . Not on file.   Social History Main Topics  . Smoking status: Light Tobacco Smoker    Types: Cigarettes  . Smokeless tobacco: Never Used  . Alcohol Use: No  . Drug Use: No  . Sexual Activity: Not on file   Other Topics Concern  . Not on file   Social History Narrative   Diet?  No salt      Do you drink/eat things with caffeine? none  Marital status?                                    What year were you married?      Do you live in a house, apartment, assisted living, condo, trailer, etc.? Apartment      Is it one or more stories? One, flat      How many persons live in your home?      Do you have any pets in your home? (please list)  no      Current or past profession:      Do you exercise?      no                                Type & how often?      Do you have a living will? no      Do you have a DNR form?    no                              If not, do you want to discuss one?      Do you have signed POA/HPOA for forms?            reports that he has been smoking Cigarettes.  He has  never used smokeless tobacco. He reports that he does not drink alcohol or use illicit drugs.  Family History  Problem Relation Age of Onset  . Family history unknown: Yes   Family Status  Relation Status Death Age  . Mother Deceased   . Brother Alive   . Brother Alive   . Sister Alive   . Sister Alive   . Brother Deceased   . Son Alive   . Daughter Alive   . Daughter Alive     Immunization History  Administered Date(s) Administered  . Influenza,inj,Quad PF,36+ Mos 08/10/2015    No Known Allergies  Medications: Patient's Medications  New Prescriptions   No medications on file  Previous Medications   AMLODIPINE (NORVASC) 10 MG TABLET    TAKE 1 TABLET BY MOUTH EVERY DAY   ASPIRIN 325 MG TABLET    Take 1 tablet (325 mg total) by mouth daily.   ATORVASTATIN (LIPITOR) 80 MG TABLET    TAKE 1 TABLET BY MOUTH EVERY DAY   CLONIDINE (CATAPRES) 0.1 MG TABLET    TAKE 1 TABLET BY MOUTH EVERY 8 HOURS AS NEEDED FOR HYPERTENSION   DONEPEZIL (ARICEPT) 10 MG TABLET    TAKE 1 TABLET BY MOUTH AT BEDTIME   FENOFIBRATE 40 MG TABS    Take 40 mg by mouth 2 (two) times daily.   FENOFIBRATE MICRONIZED (ANTARA) 43 MG CAPSULE    TAKE ONE CAPSULE BY MOUTH TWICE A DAY   FLUOXETINE (PROZAC) 20 MG CAPSULE    TAKE ONE CAPSULE BY MOUTH EVERY DAY FPR DEPRESSION   HYDRALAZINE (APRESOLINE) 10 MG TABLET    TAKE 1 TABLET BY MOUTH EVERY 8 HOURS AS NEEDED FOR ESSENTIAL HYPERTENSION   LISINOPRIL (PRINIVIL,ZESTRIL) 20 MG TABLET    TAKE 1 TABLET BY MOUTH EVERY DAY   PANTOPRAZOLE (PROTONIX) 40 MG TABLET    TAKE 1 TABLET BY MOUTH TWICE A DAY  Modified Medications   No medications on file  Discontinued Medications   CLOPIDOGREL (  PLAVIX) 75 MG TABLET    75 mg by Gastrostomy Tube route daily.    FEEDING SUPPLEMENT, ENSURE ENLIVE, (ENSURE ENLIVE) LIQD    Take 237 mLs by mouth 2 (two) times daily between meals.   HYDROCODONE-ACETAMINOPHEN (NORCO/VICODIN) 5-325 MG TABLET    Take one tablet by mouth every 6 hours as  needed for pain. Not to exceed 3000mg  of APAP from all sources/24h   MALTODEXTRIN-XANTHAN GUM (RESOURCE THICKENUP CLEAR) POWD    Take 3,600 g by mouth as needed.   SENNOSIDES-DOCUSATE SODIUM (SENOKOT-S) 8.6-50 MG TABLET    Take 1 tablet by mouth daily.     Review of Systems  Unable to perform ROS: Dementia    Filed Vitals:   01/28/16 0840  BP: 146/90  Pulse: 62  Temp: 98 F (36.7 C)  TempSrc: Oral  Resp: 20  Height: 6\' 2"  (1.88 m)  Weight: 250 lb 6.4 oz (113.581 kg)  SpO2: 98%   Body mass index is 32.14 kg/(m^2).  Physical Exam  Constitutional: He appears well-developed and well-nourished.  Looks well in NAD; uses cane to ambulate  HENT:  Mouth/Throat: Oropharynx is clear and moist.  Eyes: Pupils are equal, round, and reactive to light. No scleral icterus.  Neck: Neck supple. Carotid bruit is not present. No thyromegaly present.  Cardiovascular: Normal rate, regular rhythm, normal heart sounds and intact distal pulses.  Exam reveals no gallop and no friction rub.   No murmur heard. no distal LE swelling. No calf TTP  Pulmonary/Chest: Effort normal and breath sounds normal. He has no wheezes. He has no rales. He exhibits no tenderness.  Abdominal: Soft. Bowel sounds are normal. He exhibits no distension, no abdominal bruit, no pulsatile midline mass and no mass. There is no tenderness. There is no rebound and no guarding.  Musculoskeletal: He exhibits edema and tenderness.  LUE/LLE swelling. No contracture  Lymphadenopathy:    He has no cervical adenopathy.  Neurological: He is alert.  LUE strength 3/5; LLE strength 4/5; gait unsteady  Skin: Skin is warm and dry. No rash noted.  Psychiatric: He has a normal mood and affect. His behavior is normal. Judgment and thought content normal.   Diabetic Foot Exam - Simple   Simple Foot Form  Visual Inspection  No deformities, no ulcerations, no other skin breakdown bilaterally:  Yes  Sensation Testing  See comments:  Yes    Pulse Check  Posterior Tibialis and Dorsalis pulse intact bilaterally:  Yes  Comments  Left foot ankle swelling; no deformities; reduced monofilament testing on left        Labs reviewed: No visits with results within 3 Month(s) from this visit. Latest known visit with results is:  Admission on 09/07/2015, Discharged on 09/12/2015  No results displayed because visit has over 200 results.     CBC Latest Ref Rng 09/10/2015 09/09/2015 09/08/2015  WBC 4.0 - 10.5 K/uL 5.9 6.0 5.8  Hemoglobin 13.0 - 17.0 g/dL 9.1(L) 9.2(L) 8.7(L)  Hematocrit 39.0 - 52.0 % 28.8(L) 28.7(L) 27.9(L)  Platelets 150 - 400 K/uL 192 181 202    CMP Latest Ref Rng 09/11/2015 09/10/2015 09/09/2015  Glucose 65 - 99 mg/dL 101(H) 95 87  BUN 6 - 20 mg/dL 9 14 18   Creatinine 0.61 - 1.24 mg/dL 1.08 1.44(H) 1.50(H)  Sodium 135 - 145 mmol/L 140 140 140  Potassium 3.5 - 5.1 mmol/L 2.9(L) 3.0(L) 3.2(L)  Chloride 101 - 111 mmol/L 102 104 105  CO2 22 - 32 mmol/L 26 26 24  Calcium 8.9 - 10.3 mg/dL 9.1 9.1 9.4  Total Protein 6.5 - 8.1 g/dL - - -  Total Bilirubin 0.3 - 1.2 mg/dL - - -  Alkaline Phos 38 - 126 U/L - - -  AST 15 - 41 U/L - - -  ALT 17 - 63 U/L - - -      No results found.   Assessment/Plan   ICD-9-CM ICD-10-CM   1. Depression due to stroke ([place]) - improved 311 I63.9 CMP   434.91 F06.31 FLUoxetine (PROZAC) 10 MG capsule  2. Type II diabetes mellitus with neurological manifestations (HCC) 250.60 E11.49 CBC with Differential     Hemoglobin A1c     Urinalysis with Reflex Microscopic     Microalbumin/Creatinine Ratio, Urine  3. Essential hypertension 401.9 I10 CBC with Differential  4. Hemiparesis affecting left side as late effect of stroke ([place]) 438.20 I69.354   5. Dyslipidemia 272.4 E78.5 Lipid Panel  6. Dementia, without behavioral disturbance 294.20 F03.90 CMP  7. Chronic constipation 564.00 K59.00   8. H/O: stroke with residual effects 438.9 I69.30     Handicap placard application  completed  Recommend road test by DMV to determine if safe to drive  Reduce prozac to 10mg  daily x 2 weeks then will taper off to every other day x 2 weeks then 3 times per week x 2 weeks then 2 times per week  2 weeks and stop  Continue other medications as ordered  Follow up in 3 mos for routine visit  [place]. Perlie Gold  [facility] and Adult Medicine [address] [place], [place] [address] [phone] Cell (Monday-Friday 8 AM - 5 PM) [phone] After 5 PM and follow prompts

## 2016-01-29 LAB — COMPREHENSIVE METABOLIC PANEL
ALT: 17 IU/L (ref 0–44)
AST: 19 IU/L (ref 0–40)
Albumin/Globulin Ratio: 1.6 (ref 1.2–2.2)
Albumin: 4.5 g/dL (ref 3.5–5.5)
Alkaline Phosphatase: 73 IU/L (ref 39–117)
BILIRUBIN TOTAL: 0.2 mg/dL (ref 0.0–1.2)
BUN/Creatinine Ratio: 16 (ref 9–20)
BUN: 17 mg/dL (ref 6–24)
CHLORIDE: 102 mmol/L (ref 96–106)
CO2: 25 mmol/L (ref 18–29)
Calcium: 9.1 mg/dL (ref 8.7–10.2)
Creatinine, Ser: 1.06 mg/dL (ref 0.76–1.27)
GFR calc Af Amer: 90 mL/min/{1.73_m2} (ref >59)
GFR calc non Af Amer: 78 mL/min/{1.73_m2} (ref >59)
GLUCOSE: 104 mg/dL — AB (ref 65–99)
Globulin, Total: 2.8 g/dL (ref 1.5–4.5)
POTASSIUM: 3.5 mmol/L (ref 3.5–5.2)
Sodium: 144 mmol/L (ref 134–144)
Total Protein: 7.3 g/dL (ref 6.0–8.5)

## 2016-01-29 LAB — LIPID PANEL
CHOL/HDL RATIO: 2.4 ratio (ref 0.0–5.0)
Cholesterol, Total: 153 mg/dL (ref 100–199)
HDL: 65 mg/dL (ref >39)
LDL Calculated: 59 mg/dL (ref 0–99)
Triglycerides: 143 mg/dL (ref 0–149)
VLDL CHOLESTEROL CAL: 29 mg/dL (ref 5–40)

## 2016-01-29 LAB — CBC WITH DIFFERENTIAL/PLATELET
BASOS ABS: 0 10*3/uL (ref 0.0–0.2)
BASOS: 1 %
EOS (ABSOLUTE): 0.2 10*3/uL (ref 0.0–0.4)
Eos: 4 %
Hematocrit: 35.6 % — ABNORMAL LOW (ref 37.5–51.0)
Hemoglobin: 11.7 g/dL — ABNORMAL LOW (ref 12.6–17.7)
IMMATURE GRANULOCYTES: 0 %
Immature Grans (Abs): 0 10*3/uL (ref 0.0–0.1)
Lymphocytes Absolute: 1.7 10*3/uL (ref 0.7–3.1)
Lymphs: 38 %
MCH: 27.7 pg (ref 26.6–33.0)
MCHC: 32.9 g/dL (ref 31.5–35.7)
MCV: 84 fL (ref 79–97)
MONOS ABS: 0.5 10*3/uL (ref 0.1–0.9)
Monocytes: 12 %
NEUTROS PCT: 45 %
Neutrophils Absolute: 2 10*3/uL (ref 1.4–7.0)
PLATELETS: 120 10*3/uL — AB (ref 150–379)
RBC: 4.22 x10E6/uL (ref 4.14–5.80)
RDW: 16.4 % — AB (ref 12.3–15.4)
WBC: 4.5 10*3/uL (ref 3.4–10.8)

## 2016-01-29 LAB — MICROALBUMIN / CREATININE URINE RATIO
Creatinine, Urine: 230 mg/dL
MICROALB/CREAT RATIO: 8.1 mg/g{creat} (ref 0.0–30.0)
Microalbumin, Urine: 18.6 ug/mL

## 2016-01-29 LAB — URINALYSIS, ROUTINE W REFLEX MICROSCOPIC
Bilirubin, UA: NEGATIVE
GLUCOSE, UA: NEGATIVE
KETONES UA: NEGATIVE
LEUKOCYTES UA: NEGATIVE
Nitrite, UA: NEGATIVE
PROTEIN UA: NEGATIVE
RBC, UA: NEGATIVE
SPEC GRAV UA: 1.028 (ref 1.005–1.030)
Urobilinogen, Ur: 0.2 mg/dL (ref 0.2–1.0)
pH, UA: 6 (ref 5.0–7.5)

## 2016-01-29 LAB — HEMOGLOBIN A1C
ESTIMATED AVERAGE GLUCOSE: 120 mg/dL
HEMOGLOBIN A1C: 5.8 % — AB (ref 4.8–5.6)

## 2016-02-11 ENCOUNTER — Other Ambulatory Visit: Payer: Self-pay | Admitting: Internal Medicine

## 2016-02-13 NOTE — Addendum Note (Signed)
Addended by: Gildardo Cranker on: 02/13/2016 02:13 PM   Modules accepted: Level of Service

## 2016-02-26 ENCOUNTER — Other Ambulatory Visit: Payer: Self-pay | Admitting: *Deleted

## 2016-02-26 DIAGNOSIS — IMO0002 Reserved for concepts with insufficient information to code with codable children: Secondary | ICD-10-CM

## 2016-02-26 MED ORDER — FLUOXETINE HCL 10 MG PO CAPS: 10.0000 mg | ORAL_CAPSULE | Freq: Every day | ORAL | Status: DC

## 2016-03-14 ENCOUNTER — Other Ambulatory Visit: Payer: Self-pay | Admitting: Internal Medicine

## 2016-04-20 ENCOUNTER — Other Ambulatory Visit: Payer: Self-pay | Admitting: Internal Medicine

## 2016-05-03 ENCOUNTER — Encounter: Payer: Self-pay | Admitting: *Deleted

## 2016-05-07 ENCOUNTER — Encounter: Payer: Self-pay | Admitting: Internal Medicine

## 2016-05-07 ENCOUNTER — Ambulatory Visit (INDEPENDENT_AMBULATORY_CARE_PROVIDER_SITE_OTHER): Payer: Medicare Other | Admitting: Internal Medicine

## 2016-05-07 VITALS — BP 144/98 | HR 64 | Temp 98.7°F | Ht 74.0 in | Wt 245.0 lb

## 2016-05-07 DIAGNOSIS — I6930 Unspecified sequelae of cerebral infarction: Secondary | ICD-10-CM | POA: Diagnosis not present

## 2016-05-07 DIAGNOSIS — IMO0002 Reserved for concepts with insufficient information to code with codable children: Secondary | ICD-10-CM

## 2016-05-07 DIAGNOSIS — Z125 Encounter for screening for malignant neoplasm of prostate: Secondary | ICD-10-CM | POA: Diagnosis not present

## 2016-05-07 DIAGNOSIS — F0631 Mood disorder due to known physiological condition with depressive features: Secondary | ICD-10-CM

## 2016-05-07 DIAGNOSIS — Z1211 Encounter for screening for malignant neoplasm of colon: Secondary | ICD-10-CM

## 2016-05-07 DIAGNOSIS — I69354 Hemiplegia and hemiparesis following cerebral infarction affecting left non-dominant side: Secondary | ICD-10-CM | POA: Diagnosis not present

## 2016-05-07 DIAGNOSIS — G8110 Spastic hemiplegia affecting unspecified side: Secondary | ICD-10-CM

## 2016-05-07 DIAGNOSIS — E785 Hyperlipidemia, unspecified: Secondary | ICD-10-CM

## 2016-05-07 DIAGNOSIS — I10 Essential (primary) hypertension: Secondary | ICD-10-CM

## 2016-05-07 DIAGNOSIS — I639 Cerebral infarction, unspecified: Secondary | ICD-10-CM | POA: Diagnosis not present

## 2016-05-07 DIAGNOSIS — E1149 Type 2 diabetes mellitus with other diabetic neurological complication: Secondary | ICD-10-CM | POA: Diagnosis not present

## 2016-05-07 DIAGNOSIS — N400 Enlarged prostate without lower urinary tract symptoms: Secondary | ICD-10-CM

## 2016-05-07 DIAGNOSIS — F0390 Unspecified dementia without behavioral disturbance: Secondary | ICD-10-CM | POA: Diagnosis not present

## 2016-05-07 DIAGNOSIS — E1169 Type 2 diabetes mellitus with other specified complication: Secondary | ICD-10-CM

## 2016-05-07 MED ORDER — AMLODIPINE BESYLATE 10 MG PO TABS: 10.0000 mg | ORAL_TABLET | Freq: Every day | ORAL | Status: DC

## 2016-05-07 MED ORDER — PANTOPRAZOLE SODIUM 40 MG PO TBEC: 40.0000 mg | DELAYED_RELEASE_TABLET | Freq: Two times a day (BID) | ORAL | Status: DC

## 2016-05-07 MED ORDER — ATORVASTATIN CALCIUM 80 MG PO TABS: 80.0000 mg | ORAL_TABLET | Freq: Every day | ORAL | Status: DC

## 2016-05-07 MED ORDER — DONEPEZIL HCL 10 MG PO TABS: 10.0000 mg | ORAL_TABLET | Freq: Every day | ORAL | Status: DC

## 2016-05-07 MED ORDER — HYDROCODONE-ACETAMINOPHEN 5-325 MG PO TABS: 1.0000 | ORAL_TABLET | Freq: Four times a day (QID) | ORAL | Status: DC | PRN

## 2016-05-07 MED ORDER — TETANUS-DIPHTH-ACELL PERTUSSIS 5-2.5-18.5 LF-MCG/0.5 IM SUSP: 0.5000 mL | Freq: Once | INTRAMUSCULAR | Status: DC

## 2016-05-07 MED ORDER — FENOFIBRATE MICRONIZED 43 MG PO CAPS: 43.0000 mg | ORAL_CAPSULE | Freq: Two times a day (BID) | ORAL | Status: DC

## 2016-05-07 MED ORDER — HYDRALAZINE HCL 10 MG PO TABS: ORAL_TABLET | ORAL | Status: DC

## 2016-05-07 MED ORDER — CLONIDINE HCL 0.1 MG PO TABS: ORAL_TABLET | ORAL | Status: DC

## 2016-05-07 MED ORDER — FLUOXETINE HCL 10 MG PO CAPS: 10.0000 mg | ORAL_CAPSULE | Freq: Every day | ORAL | Status: DC

## 2016-05-07 MED ORDER — LISINOPRIL 20 MG PO TABS: 20.0000 mg | ORAL_TABLET | Freq: Every day | ORAL | Status: DC

## 2016-05-07 NOTE — Patient Instructions (Signed)
Will call with lab results  May need urology eval for prostate enlargement  Continue current medications as ordered  Follow up with neurology as scheduled  Follow up in 3 mos for routine visit.

## 2016-05-07 NOTE — Progress Notes (Signed)
Patient ID: [name] [name], male   DOB: [birth year], 55 y.o.   MRN: [id number]    Location:  [facility] Place of Service: OFFICE  Chief Complaint  Patient presents with  . Medical Management of Chronic Issues    3 months follow up, here with daughter Darden Amber. DM foot exam due  . Referral    referral to eye doctor and GI for colonoscopy  . Medication Management    Request rx for Hydrocodone  . Medication Management    discuss fenofibrate patient on two doses    HPI:  55 yo male seen today for f/u. He reports LUE pain and needs RF on hydrocodone. He c/o increased urinary frequency and voids "every 3 minutes". No recent prostate exam. No other concerns. He is a poor historian due to dementia. Hx obtained from chart and daughter, [name]  He was hospitalized last year and spent several mos at SNF following short term rehab for CVA with left hemiparesis, GI bleed with H pylori ulcer, UTI and presumed HCAP. He was readmitted at least 3 times since original admission. No rectal bleeding. No f/c. No urinary c/o. He lives with his daughter. He completed PT/OT/ST and was released from care. He uses cane to ambulate most times. He has driven x 1 since last OV. No falls   Hypertension - BP stable on 4 drug tx (norvasc, lisinopril, clonidine and hydralazine)  Dyslipidemia- stable on lipitor and fenofibrate. TG 424; total chol 253    DM - diet controlled. Last a1c is 5.9%. Neuropathy resolved  Depression - mood much improved on prozac. He reports no further depression and would like to stop med   Hx CVA with dysphagia and left hemiparesis -  no signs of aspiration present. He does not require nectar thick liquids anymore. Peg tube removed prior to d/c from SNF. Takes ASA/plavix. No neuropathy  H pylori infection - completed abx. Takes protonix BID. Takes senna for constipation  Vascular dementia - on aricept   Past Medical History  Diagnosis Date  . Stroke ([place]) 2016  . Hypertension   .  Hyperlipidemia   . Diabetes mellitus without complication ([place])   . Fall at home   . Hemiparesis affecting left side as late effect of stroke ([place])   . Dysphagia as late effect of cerebrovascular disease   . Acute respiratory failure ([place])   . Cerebral edema [facility])     Past Surgical History  Procedure Laterality Date  . Gastrostomy tube placement  05-19-15  . Mechanical thrombectomy angioplasty stenet placement    . Esophagogastroduodenoscopy N/A 08/12/2015    Procedure: ESOPHAGOGASTRODUODENOSCOPY (EGD);  Surgeon: Ladene Artist, MD;  Location[facility] ENDOSCOPY;  Service: Endoscopy;  Laterality: N/A;    Patient Care Team: Gildardo Cranker, DO as PCP - General (Internal Medicine) Gerlene Fee, NP as Nurse Practitioner (Geriatric Medicine) [facility] ([place])  Social History   Social History  . Marital Status: Single    Spouse Name: N/A  . Number of Children: N/A  . Years of Education: N/A   Occupational History  . Not on file.   Social History Main Topics  . Smoking status: Light Tobacco Smoker    Types: Cigarettes  . Smokeless tobacco: Never Used  . Alcohol Use: No  . Drug Use: No  . Sexual Activity: Not on file   Other Topics Concern  . Not on file   Social History Narrative   Diet?  No salt  Do you drink/eat things with caffeine? none      Marital status?                                    What year were you married?      Do you live in a house, apartment, assisted living, condo, trailer, etc.? Apartment      Is it one or more stories? One, flat      How many persons live in your home?      Do you have any pets in your home? (please list)  no      Current or past profession:      Do you exercise?      no                                Type & how often?      Do you have a living will? no      Do you have a DNR form?    no                              If not, do you want to discuss one?      Do you have signed  POA/HPOA for forms?            reports that he has been smoking Cigarettes.  He has never used smokeless tobacco. He reports that he does not drink alcohol or use illicit drugs.  Family History  Problem Relation Age of Onset  . Family history unknown: Yes   Family Status  Relation Status Death Age  . Mother Deceased   . Brother Alive   . Brother Alive   . Sister Alive   . Sister Alive   . Brother Deceased   . Son Alive   . Daughter Alive   . Daughter Alive      No Known Allergies  Medications: Patient's Medications  New Prescriptions   No medications on file  Previous Medications   AMLODIPINE (NORVASC) 10 MG TABLET    TAKE 1 TABLET BY MOUTH EVERY DAY   ASPIRIN 325 MG TABLET    Take 1 tablet (325 mg total) by mouth daily.   ATORVASTATIN (LIPITOR) 80 MG TABLET    TAKE 1 TABLET BY MOUTH EVERY DAY   CLONIDINE (CATAPRES) 0.1 MG TABLET    TAKE 1 TABLET BY MOUTH EVERY 8 HOURS AS NEEDED FOR HYPERTENSION   DONEPEZIL (ARICEPT) 10 MG TABLET    TAKE 1 TABLET BY MOUTH AT BEDTIME   FENOFIBRATE 40 MG TABS    Take 1 tablet by mouth 2 (two) times daily.   FENOFIBRATE MICRONIZED (ANTARA) 43 MG CAPSULE    TAKE ONE CAPSULE BY MOUTH TWICE A DAY   FLUOXETINE (PROZAC) 10 MG CAPSULE    Take 1 capsule (10 mg total) by mouth daily.   HYDRALAZINE (APRESOLINE) 10 MG TABLET    TAKE 1 TABLET BY MOUTH EVERY 8 HOURS AS NEEDED FOR ESSENTIAL HYPERTENSION   LISINOPRIL (PRINIVIL,ZESTRIL) 20 MG TABLET    TAKE 1 TABLET BY MOUTH EVERY DAY   PANTOPRAZOLE (PROTONIX) 40 MG TABLET    TAKE 1 TABLET BY MOUTH TWICE A DAY  Modified Medications   Modified Medication Previous Medication   TDAP (BOOSTRIX) 5-2.5-18.5 LF-MCG/0.5 INJECTION Tdap (  BOOSTRIX) 5-2.5-18.5 LF-MCG/0.5 injection      Inject 0.5 mLs into the muscle once.    Inject 0.5 mLs into the muscle once.  Discontinued Medications   FENOFIBRATE 40 MG TABS    Take 40 mg by mouth 2 (two) times daily. Reported on 05/07/2016    Review of Systems  Unable to  perform ROS: Dementia    Filed Vitals:   05/07/16 1018  BP: 144/98  Pulse: 64  Temp: 98.7 F (37.1 C)  TempSrc: Oral  Height: 6\' 2"  (1.88 m)  Weight: 245 lb (111.131 kg)  SpO2: 98%   Body mass index is 31.44 kg/(m^2).  Physical Exam  Constitutional: He appears well-developed and well-nourished.  Looks well in NAD; uses cane to ambulate  HENT:  Mouth/Throat: Oropharynx is clear and moist.  Eyes: Pupils are equal, round, and reactive to light. No scleral icterus.  Neck: Neck supple. Carotid bruit is not present. No thyromegaly present.  Cardiovascular: Normal rate, regular rhythm, normal heart sounds and intact distal pulses.  Exam reveals no gallop and no friction rub.   No murmur heard. no distal LE swelling. No calf TTP  Pulmonary/Chest: Effort normal and breath sounds normal. He has no wheezes. He has no rales. He exhibits no tenderness.  Abdominal: Soft. Bowel sounds are normal. He exhibits no distension, no abdominal bruit, no pulsatile midline mass and no mass. There is no tenderness. There is no rebound and no guarding.  Genitourinary: Rectum normal. Rectal exam shows no external hemorrhoid, no internal hemorrhoid, no mass, no tenderness and anal tone normal. Prostate is enlarged (smooth, no palpable mass). Prostate is not tender.  Musculoskeletal: He exhibits edema and tenderness.  LUE/LLE swelling. LUE flexion contracture at elbow  Lymphadenopathy:    He has no cervical adenopathy.  Neurological: He is alert.  LUE strength 3/5; LLE strength 4/5; gait unsteady  Skin: Skin is warm and dry. No rash noted.  Psychiatric: He has a normal mood and affect. His behavior is normal.     Labs reviewed: No visits with results within 3 Month(s) from this visit. Latest known visit with results is:  Office Visit on 01/28/2016  Component Date Value Ref Range Status  . WBC 01/28/2016 4.5  3.4 - 10.8 x10E3/uL Final  . RBC 01/28/2016 4.22  4.14 - 5.80 x10E6/uL Final  . Hemoglobin  01/28/2016 11.7* 12.6 - 17.7 g/dL Final  . Hematocrit 01/28/2016 35.6* 37.5 - 51.0 % Final  . MCV 01/28/2016 84  79 - 97 fL Final  . MCH 01/28/2016 27.7  26.6 - 33.0 pg Final  . MCHC 01/28/2016 32.9  31.5 - 35.7 g/dL Final  . RDW 01/28/2016 16.4* 12.3 - 15.4 % Final  . Platelets 01/28/2016 120* 150 - 379 x10E3/uL Final  . Neutrophils 01/28/2016 45   Final  . Lymphs 01/28/2016 38   Final  . Monocytes 01/28/2016 12   Final  . Eos 01/28/2016 4   Final  . Basos 01/28/2016 1   Final  . Neutrophils Absolute 01/28/2016 2.0  1.4 - 7.0 x10E3/uL Final  . Lymphocytes Absolute 01/28/2016 1.7  0.7 - 3.1 x10E3/uL Final  . Monocytes Absolute 01/28/2016 0.5  0.1 - 0.9 x10E3/uL Final  . EOS (ABSOLUTE) 01/28/2016 0.2  0.0 - 0.4 x10E3/uL Final  . Basophils Absolute 01/28/2016 0.0  0.0 - 0.2 x10E3/uL Final  . Immature Granulocytes 01/28/2016 0   Final  . Immature Grans (Abs) 01/28/2016 0.0  0.0 - 0.1 x10E3/uL Final  . Glucose 01/28/2016 104*  65 - 99 mg/dL Final  . BUN 01/28/2016 17  6 - 24 mg/dL Final  . Creatinine, Ser 01/28/2016 1.06  0.76 - 1.27 mg/dL Final  . GFR calc non Af Amer 01/28/2016 78  >59 mL/min/1.73 Final  . GFR calc Af Amer 01/28/2016 90  >59 mL/min/1.73 Final  . BUN/Creatinine Ratio 01/28/2016 16  9 - 20 Final  . Sodium 01/28/2016 144  134 - 144 mmol/L Final  . Potassium 01/28/2016 3.5  3.5 - 5.2 mmol/L Final  . Chloride 01/28/2016 102  96 - 106 mmol/L Final  . CO2 01/28/2016 25  18 - 29 mmol/L Final  . Calcium 01/28/2016 9.1  8.7 - 10.2 mg/dL Final  . Total Protein 01/28/2016 7.3  6.0 - 8.5 g/dL Final  . Albumin 01/28/2016 4.5  3.5 - 5.5 g/dL Final  . Globulin, Total 01/28/2016 2.8  1.5 - 4.5 g/dL Final  . Albumin/Globulin Ratio 01/28/2016 1.6  1.2 - 2.2 Final                 **Please note reference interval change**  . Bilirubin Total 01/28/2016 0.2  0.0 - 1.2 mg/dL Final  . Alkaline Phosphatase 01/28/2016 73  39 - 117 IU/L Final  . AST 01/28/2016 19  0 - 40 IU/L Final  . ALT  01/28/2016 17  0 - 44 IU/L Final  . Hgb A1c MFr Bld 01/28/2016 5.8* 4.8 - 5.6 % Final   Comment:          Pre-diabetes: 5.7 - 6.4          Diabetes: >6.4          Glycemic control for adults with diabetes: <7.0   . Est. average glucose Bld gHb Est-m* 01/28/2016 120   Final  . Specific Gravity, UA 01/28/2016 1.028  1.005 - 1.030 Final  . pH, UA 01/28/2016 6.0  5.0 - 7.5 Final  . Color, UA 01/28/2016 Yellow  Yellow Final  . Appearance Ur 01/28/2016 Clear  Clear Final  . Leukocytes, UA 01/28/2016 Negative  Negative Final  . Protein, UA 01/28/2016 Negative  Negative/Trace Final  . Glucose, UA 01/28/2016 Negative  Negative Final  . Ketones, UA 01/28/2016 Negative  Negative Final  . RBC, UA 01/28/2016 Negative  Negative Final  . Bilirubin, UA 01/28/2016 Negative  Negative Final  . Urobilinogen, Ur 01/28/2016 0.2  0.2 - 1.0 mg/dL Final  . Nitrite, UA 01/28/2016 Negative  Negative Final  . Microscopic Examination 01/28/2016 Comment   Final   Microscopic not indicated and not performed.  . Creatinine, Urine 01/28/2016 230.0  Not Estab. mg/dL Final  . Microalbum.,U,Random 01/28/2016 18.6  Not Estab. ug/mL Final  . MICROALB/CREAT RATIO 01/28/2016 8.1  0.0 - 30.0 mg/g creat Final  . Cholesterol, Total 01/28/2016 153  100 - 199 mg/dL Final  . Triglycerides 01/28/2016 143  0 - 149 mg/dL Final  . HDL 01/28/2016 65  >39 mg/dL Final  . VLDL Cholesterol Cal 01/28/2016 29  5 - 40 mg/dL Final  . LDL Calculated 01/28/2016 59  0 - 99 mg/dL Final  . Chol/HDL Ratio 01/28/2016 2.4  0.0 - 5.0 ratio units Final   Comment:                                   T. Chol/HDL Ratio  Men  Women                               1/2 Avg.Risk  3.4    3.3                                   Avg.Risk  5.0    4.4                                2X Avg.Risk  9.6    7.1                                3X Avg.Risk 23.4   11.0     No results found.   Assessment/Plan   ICD-9-CM  ICD-10-CM   1. Essential hypertension 401.9 I10 amLODipine (NORVASC) 10 MG tablet     cloNIDine (CATAPRES) 0.1 MG tablet     hydrALAZINE (APRESOLINE) 10 MG tablet     lisinopril (PRINIVIL,ZESTRIL) 20 MG tablet  2. Depression due to stroke ([place]) 311 I63.9 FLUoxetine (PROZAC) 10 MG capsule   434.91 F06.31   3. Hemiparesis affecting left side as late effect of stroke (HCC) 438.20 I69.354   4. H/O: stroke with residual effects 438.9 I69.30   5. Type II diabetes mellitus with neurological manifestations ([place]) 250.60 E11.49 Ambulatory referral to Ophthalmology     Basic Metabolic Panel     ALT     Hemoglobin A1c     CBC with Differential/Platelets  6. Dementia, without behavioral disturbance 294.20 F03.90 donepezil (ARICEPT) 10 MG tablet  7. Dyslipidemia associated with type 2 diabetes mellitus (HCC) 250.80 E11.69 atorvastatin (LIPITOR) 80 MG tablet   272.4 E78.5 fenofibrate micronized (ANTARA) 43 MG capsule  8. Spastic hemiplegia affecting nondominant side (HCC) 342.12 G81.10 HYDROcodone-acetaminophen (NORCO) 5-325 MG tablet  9. Colon cancer screening V76.51 Z12.11 Ambulatory referral to Gastroenterology  10. Prostate cancer screening V76.44 Z12.5 PSA  11. BPH (benign prostatic hypertrophy) 600.00 N40.0 PSA    NO DRIVING UNTIL CLEARED BY NEUROLOGY  T/c flomax vs proscar for urinary frequency. Await lab results prior to Rx  Pain contract next visit  May need urology eval for prostate enlargement  Continue current medications as ordered  Follow up with neurology as scheduled  Follow up in 3 mos for routine visit. Will need A1c, lipid panel, bmp, alt labs  [place]. Perlie Gold  [facility] and Adult Medicine [address] [place], [place] [address] [phone] Cell (Monday-Friday 8 AM - 5 PM) [phone] After 5 PM and follow prompts

## 2016-05-08 LAB — BASIC METABOLIC PANEL
BUN / CREAT RATIO: 11 (ref 9–20)
BUN: 13 mg/dL (ref 6–24)
CO2: 22 mmol/L (ref 18–29)
CREATININE: 1.22 mg/dL (ref 0.76–1.27)
Calcium: 9.4 mg/dL (ref 8.7–10.2)
Chloride: 102 mmol/L (ref 96–106)
GFR, EST AFRICAN AMERICAN: 76 mL/min/{1.73_m2} (ref >59)
GFR, EST NON AFRICAN AMERICAN: 65 mL/min/{1.73_m2} (ref >59)
Glucose: 73 mg/dL (ref 65–99)
Potassium: 3.7 mmol/L (ref 3.5–5.2)
SODIUM: 142 mmol/L (ref 134–144)

## 2016-05-08 LAB — CBC WITH DIFFERENTIAL/PLATELET
Basophils Absolute: 0 10*3/uL (ref 0.0–0.2)
Basos: 1 %
EOS (ABSOLUTE): 0.1 10*3/uL (ref 0.0–0.4)
Eos: 3 %
Hematocrit: 41.8 % (ref 37.5–51.0)
Hemoglobin: 13.7 g/dL (ref 12.6–17.7)
Immature Grans (Abs): 0 10*3/uL (ref 0.0–0.1)
Immature Granulocytes: 0 %
Lymphocytes Absolute: 1.3 10*3/uL (ref 0.7–3.1)
Lymphs: 40 %
MCH: 28.3 pg (ref 26.6–33.0)
MCHC: 32.8 g/dL (ref 31.5–35.7)
MCV: 86 fL (ref 79–97)
Monocytes Absolute: 0.3 10*3/uL (ref 0.1–0.9)
Monocytes: 10 %
Neutrophils Absolute: 1.6 10*3/uL (ref 1.4–7.0)
Neutrophils: 46 %
Platelets: 115 10*3/uL — ABNORMAL LOW (ref 150–379)
RBC: 4.84 x10E6/uL (ref 4.14–5.80)
RDW: 15.3 % (ref 12.3–15.4)
WBC: 3.4 10*3/uL (ref 3.4–10.8)

## 2016-05-08 LAB — ALT: ALT: 10 IU/L (ref 0–44)

## 2016-05-08 LAB — PSA: PROSTATE SPECIFIC AG, SERUM: 3.2 ng/mL (ref 0.0–4.0)

## 2016-05-08 LAB — HEMOGLOBIN A1C
Est. average glucose Bld gHb Est-mCnc: 111 mg/dL
Hgb A1c MFr Bld: 5.5 % (ref 4.8–5.6)

## 2016-05-14 ENCOUNTER — Ambulatory Visit: Payer: Medicare Other | Admitting: Internal Medicine

## 2016-05-17 ENCOUNTER — Encounter: Payer: Self-pay | Admitting: Internal Medicine

## 2016-06-16 ENCOUNTER — Ambulatory Visit: Payer: Medicare Other | Admitting: Internal Medicine

## 2016-08-11 ENCOUNTER — Encounter: Payer: Self-pay | Admitting: Internal Medicine

## 2016-08-11 ENCOUNTER — Ambulatory Visit (INDEPENDENT_AMBULATORY_CARE_PROVIDER_SITE_OTHER): Payer: Medicare Other | Admitting: Internal Medicine

## 2016-08-11 VITALS — BP 170/109 | Temp 98.4°F | Ht 74.0 in | Wt 262.6 lb

## 2016-08-11 DIAGNOSIS — I10 Essential (primary) hypertension: Secondary | ICD-10-CM

## 2016-08-11 DIAGNOSIS — Z1211 Encounter for screening for malignant neoplasm of colon: Secondary | ICD-10-CM | POA: Diagnosis not present

## 2016-08-11 DIAGNOSIS — E785 Hyperlipidemia, unspecified: Secondary | ICD-10-CM

## 2016-08-11 DIAGNOSIS — E1169 Type 2 diabetes mellitus with other specified complication: Secondary | ICD-10-CM | POA: Diagnosis not present

## 2016-08-11 DIAGNOSIS — I69354 Hemiplegia and hemiparesis following cerebral infarction affecting left non-dominant side: Secondary | ICD-10-CM | POA: Diagnosis not present

## 2016-08-11 DIAGNOSIS — F0631 Mood disorder due to known physiological condition with depressive features: Secondary | ICD-10-CM | POA: Diagnosis not present

## 2016-08-11 DIAGNOSIS — I6930 Unspecified sequelae of cerebral infarction: Secondary | ICD-10-CM | POA: Diagnosis not present

## 2016-08-11 DIAGNOSIS — Z720 Tobacco use: Secondary | ICD-10-CM

## 2016-08-11 DIAGNOSIS — I639 Cerebral infarction, unspecified: Secondary | ICD-10-CM

## 2016-08-11 DIAGNOSIS — R350 Frequency of micturition: Secondary | ICD-10-CM

## 2016-08-11 DIAGNOSIS — IMO0002 Reserved for concepts with insufficient information to code with codable children: Secondary | ICD-10-CM

## 2016-08-11 DIAGNOSIS — E1149 Type 2 diabetes mellitus with other diabetic neurological complication: Secondary | ICD-10-CM

## 2016-08-11 LAB — COMPLETE METABOLIC PANEL WITH GFR
ALBUMIN: 4.7 g/dL (ref 3.6–5.1)
ALK PHOS: 76 U/L (ref 40–115)
ALT: 20 U/L (ref 9–46)
AST: 21 U/L (ref 10–35)
BILIRUBIN TOTAL: 0.5 mg/dL (ref 0.2–1.2)
BUN: 16 mg/dL (ref 7–25)
CO2: 22 mmol/L (ref 20–31)
CREATININE: 1.02 mg/dL (ref 0.70–1.33)
Calcium: 9.9 mg/dL (ref 8.6–10.3)
Chloride: 105 mmol/L (ref 98–110)
GFR, EST NON AFRICAN AMERICAN: 81 mL/min (ref >=60)
Glucose, Bld: 89 mg/dL (ref 65–99)
Potassium: 3.5 mmol/L (ref 3.5–5.3)
Sodium: 142 mmol/L (ref 135–146)
TOTAL PROTEIN: 7.7 g/dL (ref 6.1–8.1)

## 2016-08-11 LAB — LIPID PANEL
Cholesterol: 212 mg/dL — ABNORMAL HIGH (ref 125–200)
HDL: 57 mg/dL (ref >=40)
LDL Cholesterol: 128 mg/dL (ref <130)
TRIGLYCERIDES: 134 mg/dL (ref <150)
Total CHOL/HDL Ratio: 3.7 Ratio (ref <=5.0)
VLDL: 27 mg/dL (ref <30)

## 2016-08-11 NOTE — Progress Notes (Signed)
Patient ID: [name] [name], male   DOB: Jun 23, 1961, 55 y.o.   MRN: [id number]    Location:  [facility] Place of Service: OFFICE  Chief Complaint  Patient presents with  . Medical Management of Chronic Issues    3 months routine visit  . Flu Vaccine    requested    HPI:  55 yo male seen today for f/u. He c/ o 1 month hx urinary frequency and urine leakage. No dysuria or hematuria. No abdominal pain. No straining with urination. On last exam, he had an enlarged prostate.  He is taking advil and tylenol for LUE pain. He tries not to lie on it at night while sleeping  He smokes 1 ppd.  He is a poor historian due to dementia. Hx obtained from chart  Hypertension - BP elevated today. He was unable to take the lids off medication this AM and therefore has not taken them yet. He is on 4 drug tx (norvasc, lisinopril, clonidine and hydralazine)  Dyslipidemia- stable on lipitor and fenofibrate. TG 143; total chol 153; LDL 59     DM - diet controlled. Last a1c is 5.9%. Neuropathy resolved  Depression - mood much improved on prozac. He reports no further depression and would like to stop med   Hx CVA with dysphagia and left hemiparesis -  no signs of aspiration present. He does not require nectar thick liquids anymore. Peg tube removed prior to d/c from SNF. Takes ASA/plavix. No neuropathy but he does have pain in LUE that is controlled with Advil and Tylenol. Rarely takes norco. Uses cane to ambulate  H pylori infection - completed abx. Takes protonix BID. Takes senna for constipation  Vascular dementia - on aricept   Past Medical History:  Diagnosis Date  . Acute respiratory failure ([place])   . Cerebral edema (H[facility])   . Diabetes mellitus without complication ([place])   . Dysphagia as late effect of cerebrovascular disease   . Fall at home   . Hemiparesis affecting left side as late effect of stroke ([place])   . Hyperlipidemia   . Hypertension   . Stroke [facility]) 2016    Past Surgical History:    Procedure Laterality Date  . ESOPHAGOGASTRODUODENOSCOPY N/A 08/12/2015   Procedure: ESOPHAGOGASTRODUODENOSCOPY (EGD);  Surgeon: Ladene Artist, MD;  Location: [facility] ENDOSCOPY;  Service: Endoscopy;  Laterality: N/A;  . GASTROSTOMY TUBE PLACEMENT  05-19-15  . mechanical thrombectomy angioplasty stenet placement      Patient Care Team: Gildardo Cranker, DO as PCP - General (Internal Medicine) Gerlene Fee, NP as Nurse Practitioner (Geriatric Medicine) [facility] ([place]  Social History   Social History  . Marital status: Single    Spouse name: N/A  . Number of children: N/A  . Years of education: N/A   Occupational History  . Not on file.   Social History Main Topics  . Smoking status: Light Tobacco Smoker    Types: Cigarettes  . Smokeless tobacco: Never Used  . Alcohol use No  . Drug use: No  . Sexual activity: Not on file   Other Topics Concern  . Not on file   Social History Narrative   Diet?  No salt      Do you drink/eat things with caffeine? none      Marital status?  What year were you married?      Do you live in a house, apartment, assisted living, condo, trailer, etc.? Apartment      Is it one or more stories? One, flat      How many persons live in your home?      Do you have any pets in your home? (please list)  no      Current or past profession:      Do you exercise?      no                                Type & how often?      Do you have a living will? no      Do you have a DNR form?    no                              If not, do you want to discuss one?      Do you have signed POA/HPOA for forms?            reports that he has been smoking Cigarettes.  He has never used smokeless tobacco. He reports that he does not drink alcohol or use drugs.  Family History  Problem Relation Age of Onset  . Family history unknown: Yes   Family Status  Relation Status  . Mother  Deceased  . Brother Alive  . Brother Alive  . Sister Alive  . Sister Alive  . Brother Deceased  . Son Alive  . Daughter Alive  . Daughter Alive     No Known Allergies  Medications: Patient's Medications  New Prescriptions   No medications on file  Previous Medications   AMLODIPINE (NORVASC) 10 MG TABLET    Take 1 tablet (10 mg total) by mouth daily.   ASPIRIN 325 MG TABLET    Take 1 tablet (325 mg total) by mouth daily.   ATORVASTATIN (LIPITOR) 80 MG TABLET    Take 1 tablet (80 mg total) by mouth daily.   CLONIDINE (CATAPRES) 0.1 MG TABLET    TAKE 1 TABLET BY MOUTH EVERY 8 HOURS AS NEEDED FOR HYPERTENSION   DONEPEZIL (ARICEPT) 10 MG TABLET    Take 1 tablet (10 mg total) by mouth at bedtime.   FENOFIBRATE MICRONIZED (ANTARA) 43 MG CAPSULE    Take 1 capsule (43 mg total) by mouth 2 (two) times daily.   FLUOXETINE (PROZAC) 10 MG CAPSULE    Take 1 capsule (10 mg total) by mouth daily.   HYDRALAZINE (APRESOLINE) 10 MG TABLET    TAKE 1 TABLET BY MOUTH EVERY 8 HOURS FOR ESSENTIAL HYPERTENSION   HYDROCODONE-ACETAMINOPHEN (NORCO) 5-325 MG TABLET    Take 1 tablet by mouth every 6 (six) hours as needed for moderate pain.   LISINOPRIL (PRINIVIL,ZESTRIL) 20 MG TABLET    Take 1 tablet (20 mg total) by mouth daily.   PANTOPRAZOLE (PROTONIX) 40 MG TABLET    Take 1 tablet (40 mg total) by mouth 2 (two) times daily.  Modified Medications   No medications on file  Discontinued Medications   TDAP (BOOSTRIX) 5-2.5-18.5 LF-MCG/0.5 INJECTION    Inject 0.5 mLs into the muscle once.    Review of Systems  Unable to perform ROS: Dementia    Vitals:   08/11/16 0813  BP: (!) 170/109  Temp: 98.4 F (  36.9 C)  TempSrc: Oral  SpO2: 96%  Weight: 262 lb 9.6 oz (119.1 kg)  Height: _0  (1.88 m)   Body mass index is 33.72 kg/m.  Physical Exam  Constitutional: He appears well-developed and well-nourished.  Looks well in NAD; uses cane to ambulate  HENT:  Mouth/Throat: Oropharynx is clear and  moist.  Eyes: Pupils are equal, round, and reactive to light. No scleral icterus.  Neck: Neck supple. Carotid bruit is not present. No thyromegaly present.  Cardiovascular: Normal rate, regular rhythm, normal heart sounds and intact distal pulses.  Exam reveals no gallop and no friction rub.   No murmur heard. no distal LE swelling. No calf TTP  Pulmonary/Chest: Effort normal and breath sounds normal. He has no wheezes. He has no rales. He exhibits no tenderness.  Abdominal: Soft. Bowel sounds are normal. He exhibits no distension, no abdominal bruit, no pulsatile midline mass and no mass. There is no tenderness. There is no rebound and no guarding.  No suprapubic TTP  Musculoskeletal: He exhibits edema and tenderness.  LUE/LLE swelling. No contracture  Lymphadenopathy:    He has no cervical adenopathy.  Neurological: He is alert.  LUE strength 3/5; LLE strength 4/5; gait unsteady  Skin: Skin is warm and dry. No rash noted.  Psychiatric: He has a normal mood and affect. His behavior is normal. Judgment and thought content normal.     Labs reviewed: No visits with results within 3 Month(s) from this visit.  Latest known visit with results is:  Office Visit on 05/07/2016  Component Date Value Ref Range Status  . Glucose 05/08/2016 73  65 - 99 mg/dL Final  . BUN 05/08/2016 13  6 - 24 mg/dL Final  . Creatinine, Ser 05/08/2016 1.22  0.76 - 1.27 mg/dL Final  . GFR calc non Af Amer 05/08/2016 65  >59 mL/min/1.73 Final  . GFR calc Af Amer 05/08/2016 76  >59 mL/min/1.73 Final  . BUN/Creatinine Ratio 05/08/2016 11  9 - 20 Final  . Sodium 05/08/2016 142  134 - 144 mmol/L Final  . Potassium 05/08/2016 3.7  3.5 - 5.2 mmol/L Final  . Chloride 05/08/2016 102  96 - 106 mmol/L Final  . CO2 05/08/2016 22  18 - 29 mmol/L Final  . Calcium 05/08/2016 9.4  8.7 - 10.2 mg/dL Final  . ALT 05/08/2016 10  0 - 44 IU/L Final  . Hgb A1c MFr Bld 05/08/2016 5.5  4.8 - 5.6 % Final   Comment:           Pre-diabetes: 5.7 - 6.4          Diabetes: >6.4          Glycemic control for adults with diabetes: <7.0   . Est. average glucose Bld gHb Est-m* 05/08/2016 111  mg/dL Final  . WBC 05/08/2016 3.4  3.4 - 10.8 x10E3/uL Final  . RBC 05/08/2016 4.84  4.14 - 5.80 x10E6/uL Final  . Hemoglobin 05/08/2016 13.7  12.6 - 17.7 g/dL Final  . Hematocrit 05/08/2016 41.8  37.5 - 51.0 % Final  . MCV 05/08/2016 86  79 - 97 fL Final  . MCH 05/08/2016 28.3  26.6 - 33.0 pg Final  . MCHC 05/08/2016 32.8  31.5 - 35.7 g/dL Final  . RDW 05/08/2016 15.3  12.3 - 15.4 % Final  . Platelets 05/08/2016 115* 150 - 379 x10E3/uL Final  . Neutrophils 05/08/2016 46  % Final  . Lymphs 05/08/2016 40  % Final  . Monocytes 05/08/2016 10  %  Final  . Eos 05/08/2016 3  % Final  . Basos 05/08/2016 1  % Final  . Neutrophils Absolute 05/08/2016 1.6  1.4 - 7.0 x10E3/uL Final  . Lymphocytes Absolute 05/08/2016 1.3  0.7 - 3.1 x10E3/uL Final  . Monocytes Absolute 05/08/2016 0.3  0.1 - 0.9 x10E3/uL Final  . EOS (ABSOLUTE) 05/08/2016 0.1  0.0 - 0.4 x10E3/uL Final  . Basophils Absolute 05/08/2016 0.0  0.0 - 0.2 x10E3/uL Final  . Immature Granulocytes 05/08/2016 0  % Final  . Immature Grans (Abs) 05/08/2016 0.0  0.0 - 0.1 x10E3/uL Final  . Hematology Comments: 05/08/2016 Note:   Final  . Prostate Specific Ag, Serum 05/08/2016 3.2  0.0 - 4.0 ng/mL Final   Comment: Roche ECLIA methodology. According to the American Urological Association, Serum PSA should decrease and remain at undetectable levels after radical prostatectomy. The AUA defines biochemical recurrence as an initial PSA value 0.2 ng/mL or greater followed by a subsequent confirmatory PSA value 0.2 ng/mL or greater. Values obtained with different assay methods or kits cannot be used interchangeably. Results cannot be interpreted as absolute evidence of the presence or absence of malignant disease.     No results found.   Assessment/Plan   ICD-9-CM ICD-10-CM   1.  Urinary frequency 788.41 R35.0 Urinalysis with Reflex Microscopic  2. Essential hypertension 401.9 I10 CMP with eGFR  3. Depression due to stroke ([place]) 434.91 I63.9    293.83 F06.31   4. H/O: stroke with residual effects 438.9 I69.30   5. Colon cancer screening V76.51 Z12.11 Ambulatory referral to Gastroenterology  6. Tobacco abuse 305.1 Z72.0   7. Type II diabetes mellitus with neurological manifestations ([place]) 250.60 E11.49 CMP with eGFR     Hemoglobin A1c     Urinalysis with Reflex Microscopic  8. Dyslipidemia associated with type 2 diabetes mellitus ([place]) 250.80 E11.69 Lipid Panel   272.4 E78.5   9. Hemiparesis affecting left side as late effect of stroke (HCC) 438.20 I69.354    T/c flomax vs proscar for urinary sx's pending UA results  Smoking cessation discussed and highly urged  Will contact pharmacy to see if we can get lids for bottles that are easier to open.  Please obtain weekly pill box to make it easier to take medication on time  Will call with GI referral for colonoscopy  No flu shot today due to elevated blood pressure  Follow up in 1 month for BP check.  Samanda Buske S. Perlie Gold  [facility] [address] [place], [place] [address] [phone] Cell (Monday-Friday 8 AM - 5 PM) [phone] After 5 PM and follow prompts

## 2016-08-11 NOTE — Patient Instructions (Addendum)
Please obtain weekly pill box to make it easier to take medication on time  Will call with GI referral for colonoscopy  STOP SMOKING!  No flu shot today due to elevated blood pressure  Will call with lab results  Follow up in 1 month for BP check.

## 2016-08-12 ENCOUNTER — Encounter: Payer: Self-pay | Admitting: Nurse Practitioner

## 2016-08-12 LAB — URINALYSIS, MICROSCOPIC ONLY
Bacteria, UA: NONE SEEN [HPF]
CASTS: NONE SEEN [LPF]
CRYSTALS: NONE SEEN [HPF]
Yeast: NONE SEEN [HPF]

## 2016-08-12 LAB — URINALYSIS, ROUTINE W REFLEX MICROSCOPIC
BILIRUBIN URINE: NEGATIVE
GLUCOSE, UA: NEGATIVE
HGB URINE DIPSTICK: NEGATIVE
KETONES UR: NEGATIVE
Leukocytes, UA: NEGATIVE
NITRITE: NEGATIVE
PH: 5.5 (ref 5.0–8.0)
Specific Gravity, Urine: 1.028 (ref 1.001–1.035)

## 2016-08-12 LAB — HEMOGLOBIN A1C
HEMOGLOBIN A1C: 5.6 % (ref <5.7)
Mean Plasma Glucose: 114 mg/dL

## 2016-08-19 ENCOUNTER — Other Ambulatory Visit: Payer: Self-pay

## 2016-08-19 DIAGNOSIS — I10 Essential (primary) hypertension: Secondary | ICD-10-CM

## 2016-08-19 DIAGNOSIS — E1169 Type 2 diabetes mellitus with other specified complication: Secondary | ICD-10-CM

## 2016-08-19 DIAGNOSIS — IMO0002 Reserved for concepts with insufficient information to code with codable children: Secondary | ICD-10-CM

## 2016-08-19 DIAGNOSIS — E785 Hyperlipidemia, unspecified: Secondary | ICD-10-CM

## 2016-08-19 MED ORDER — LISINOPRIL 20 MG PO TABS: 20.0000 mg | ORAL_TABLET | Freq: Every day | ORAL | 0 refills | Status: DC

## 2016-08-19 MED ORDER — FENOFIBRATE MICRONIZED 43 MG PO CAPS: 43.0000 mg | ORAL_CAPSULE | Freq: Two times a day (BID) | ORAL | 0 refills | Status: DC

## 2016-08-19 MED ORDER — DONEPEZIL HCL 10 MG PO TABS: 10.0000 mg | ORAL_TABLET | Freq: Every day | ORAL | 0 refills | Status: DC

## 2016-08-19 MED ORDER — FLUOXETINE HCL 10 MG PO CAPS: 10.0000 mg | ORAL_CAPSULE | Freq: Every day | ORAL | 1 refills | Status: DC

## 2016-08-19 MED ORDER — PANTOPRAZOLE SODIUM 40 MG PO TBEC
40.0000 mg | DELAYED_RELEASE_TABLET | Freq: Two times a day (BID) | ORAL | 0 refills | Status: DC
Start: 2016-08-19 — End: 2017-02-01

## 2016-08-19 MED ORDER — AMLODIPINE BESYLATE 10 MG PO TABS: 10.0000 mg | ORAL_TABLET | Freq: Every day | ORAL | 0 refills | Status: DC

## 2016-08-19 MED ORDER — CLONIDINE HCL 0.1 MG PO TABS: ORAL_TABLET | ORAL | 0 refills | Status: DC

## 2016-08-19 MED ORDER — ATORVASTATIN CALCIUM 80 MG PO TABS: 80.0000 mg | ORAL_TABLET | Freq: Every day | ORAL | 0 refills | Status: DC

## 2016-08-19 MED ORDER — HYDRALAZINE HCL 10 MG PO TABS: ORAL_TABLET | ORAL | 1 refills | Status: DC

## 2016-08-19 NOTE — Telephone Encounter (Signed)
Patient left message on voicemail requesting refills.  I called patient's daughter to clarify which rx patient needs refill and she said all of them except Hydrocodone  RX's sent to pharmacy

## 2016-08-27 ENCOUNTER — Encounter: Payer: Self-pay | Admitting: Gastroenterology

## 2016-08-30 ENCOUNTER — Ambulatory Visit: Payer: Medicare Other | Admitting: Nurse Practitioner

## 2016-09-16 ENCOUNTER — Ambulatory Visit (AMBULATORY_SURGERY_CENTER): Payer: Self-pay | Admitting: *Deleted

## 2016-09-16 VITALS — Ht 74.0 in | Wt 263.0 lb

## 2016-09-16 DIAGNOSIS — Z1211 Encounter for screening for malignant neoplasm of colon: Secondary | ICD-10-CM

## 2016-09-16 MED ORDER — NA SULFATE-K SULFATE-MG SULF 17.5-3.13-1.6 GM/177ML PO SOLN: 1.0000 | Freq: Once | ORAL | 0 refills | Status: AC

## 2016-09-16 NOTE — Progress Notes (Signed)
No egg or soy allergy known to patient  No issues with past sedation with any surgeries  or procedures, no intubation problems  No diet pills per patient No home 02 use per patient  No blood thinners per patient  Pt denies issues with constipation  No A fib or A flutter   pt does take a 325 mg ASA- son Pleas Patricia in [place] with pt today -- Pt states he does not fall a lot at home Pt has little use of left hand/ arm but states h e can dress and undress self

## 2016-09-22 ENCOUNTER — Ambulatory Visit (INDEPENDENT_AMBULATORY_CARE_PROVIDER_SITE_OTHER): Payer: Medicare Other | Admitting: Internal Medicine

## 2016-09-22 ENCOUNTER — Encounter: Payer: Self-pay | Admitting: Internal Medicine

## 2016-09-22 VITALS — BP 134/88 | HR 58 | Temp 98.1°F | Ht 74.0 in | Wt 265.4 lb

## 2016-09-22 DIAGNOSIS — E1169 Type 2 diabetes mellitus with other specified complication: Secondary | ICD-10-CM

## 2016-09-22 DIAGNOSIS — E1149 Type 2 diabetes mellitus with other diabetic neurological complication: Secondary | ICD-10-CM | POA: Diagnosis not present

## 2016-09-22 DIAGNOSIS — I6930 Unspecified sequelae of cerebral infarction: Secondary | ICD-10-CM

## 2016-09-22 DIAGNOSIS — Z23 Encounter for immunization: Secondary | ICD-10-CM

## 2016-09-22 DIAGNOSIS — I10 Essential (primary) hypertension: Secondary | ICD-10-CM

## 2016-09-22 DIAGNOSIS — E785 Hyperlipidemia, unspecified: Secondary | ICD-10-CM

## 2016-09-22 DIAGNOSIS — I639 Cerebral infarction, unspecified: Secondary | ICD-10-CM

## 2016-09-22 DIAGNOSIS — F0631 Mood disorder due to known physiological condition with depressive features: Secondary | ICD-10-CM

## 2016-09-22 NOTE — Progress Notes (Signed)
Patient ID: [name] [name], male   DOB: 06/22/1961, 55 y.o.   MRN: [id number]    Location:  [facility] Place of Service: OFFICE  Chief Complaint  Patient presents with  . Medical Management of Chronic Issues    routine visit to recheck BP  . Flu Vaccine    requested    HPI:  55 yo male seen today for f/u HTN. At last OV, BP significantly elevated due to not taking medication. Since that visit, bottle lids were changed and he is able to get into rx bottles easier. No CP, SOB or palpitations. No HA or dizziness. He is a poor historian due to dementia. Hx obtained from chart  He needs a flu shot today. It was held last visit due to elevated BP  Hypertension - BP elevated today. He was unable to take the lids off medication this AM and therefore has not taken them yet. He is on 4 drug tx (norvasc, lisinopril, clonidine and hydralazine)  Dyslipidemia- stable on lipitor and fenofibrate. TG 143; total chol 153; LDL 59     DM - diet controlled. Last a1c is 5.9%. Neuropathy resolved  Depression - stable on prozac. He reports no further depression and would like to stop med   Hx CVA with dysphagia and left hemiparesis -  no signs of aspiration present. He does not require nectar thick liquids anymore. Peg tube removed prior to d/c from SNF. Takes ASA/plavix. No neuropathy but he does have pain in LUE that is controlled with Advil and Tylenol. Rarely takes norco. Uses cane to ambulate  H pylori infection - completed abx. Takes protonix BID. Takes senna for constipation  Vascular dementia - stable on aricept   Past Medical History:  Diagnosis Date  . Acute respiratory failure (HCC)   . Cerebral edema (H[facility])   . Diabetes mellitus without complication ([facility])    diet controlled- no meds  . Dysphagia as late effect of cerebrovascular disease   . Fall at home   . Hemiparesis affecting left side as late effect of stroke (H[facility])   . Hyperlipidemia   . Hypertension   . Stroke ([facility]) 2016  . Tuberculosis      11 yrs ago per pt.- treated with meds per pt.    Past Surgical History:  Procedure Laterality Date  . ESOPHAGOGASTRODUODENOSCOPY N/A 08/12/2015   Procedure: ESOPHAGOGASTRODUODENOSCOPY (EGD);  Surgeon: Malcolm T Stark, MD;  Location: [facility] ENDOSCOPY;  Service: Endoscopy;  Laterality: N/A;  . feeding tube removed    . GASTROSTOMY TUBE PLACEMENT  05-19-15  . mechanical thrombectomy angioplasty stenet placement    . UPPER GASTROINTESTINAL ENDOSCOPY      Patient Care Team:  , DO as PCP - General (Internal Medicine) Deborah S Green, NP as Nurse Practitioner (Geriatric Medicine) [facility]  Social History   Social History  . Marital status: Single    Spouse name: N/A  . Number of children: N/A  . Years of education: N/A   Occupational History  . Not on file.   Social History Main Topics  . Smoking status: Current Every Day Smoker    Packs/day: 1.00    Types: Cigarettes  . Smokeless tobacco: Never Used  . Alcohol use No  . Drug use: No  . Sexual activity: Not on file   Other Topics Concern  . Not on file   Social History Narrative   Diet?  No salt      Do you drink/eat things   with caffeine? none      Marital status?                                    What year were you married?      Do you live in a house, apartment, assisted living, condo, trailer, etc.? Apartment      Is it one or more stories? One, flat      How many persons live in your home?      Do you have any pets in your home? (please list)  no      Current or past profession:      Do you exercise?      no                                Type & how often?      Do you have a living will? no      Do you have a DNR form?    no                              If not, do you want to discuss one?      Do you have signed POA/HPOA for forms?            reports that he has been smoking Cigarettes.  He has been smoking about 1.00 pack per day. He has never  used smokeless tobacco. He reports that he does not drink alcohol or use drugs.  Family History  Problem Relation Age of Onset  . Colon cancer Neg Hx   . Colon polyps Neg Hx   . Rectal cancer Neg Hx   . Stomach cancer Neg Hx   . Esophageal cancer Neg Hx    Family Status  Relation Status  . Mother Deceased  . Brother Alive  . Brother Alive  . Sister Alive  . Sister Alive  . Brother Deceased  . Son Alive  . Daughter Alive  . Daughter Alive  . Neg Hx      No Known Allergies  Medications: Patient's Medications  New Prescriptions   No medications on file  Previous Medications   AMLODIPINE (NORVASC) 10 MG TABLET    Take 1 tablet (10 mg total) by mouth daily.   ASPIRIN 325 MG TABLET    Take 1 tablet (325 mg total) by mouth daily.   ATORVASTATIN (LIPITOR) 80 MG TABLET    Take 1 tablet (80 mg total) by mouth daily.   CLONIDINE (CATAPRES) 0.1 MG TABLET    TAKE 1 TABLET BY MOUTH EVERY 8 HOURS AS NEEDED FOR HYPERTENSION   DONEPEZIL (ARICEPT) 10 MG TABLET    Take 1 tablet (10 mg total) by mouth at bedtime.   FENOFIBRATE MICRONIZED (ANTARA) 43 MG CAPSULE    Take 1 capsule (43 mg total) by mouth 2 (two) times daily.   FLUOXETINE (PROZAC) 10 MG CAPSULE    Take 1 capsule (10 mg total) by mouth daily.   HYDROCODONE-ACETAMINOPHEN (NORCO) 5-325 MG TABLET    Take 1 tablet by mouth every 6 (six) hours as needed for moderate pain.   LISINOPRIL (PRINIVIL,ZESTRIL) 20 MG TABLET    Take 1 tablet (20 mg total) by mouth daily.   PANTOPRAZOLE (PROTONIX) 40 MG TABLET    Take 1   tablet (40 mg total) by mouth 2 (two) times daily.  Modified Medications   No medications on file  Discontinued Medications   No medications on file    Review of Systems  Unable to perform ROS: Other (expressive aphasia)    Vitals:   09/22/16 0916  BP: 134/88  Pulse: (!) 58  Temp: 98.1 F (36.7 C)  TempSrc: Oral  SpO2: 98%  Weight: 265 lb 6.4 oz (120.4 kg)  Height: 6' 2" (1.88 m)   Body mass index is 34.08  kg/m.  Physical Exam  Constitutional: He appears well-developed and well-nourished.  Looks well in NAD  Cardiovascular: Normal rate, regular rhythm and intact distal pulses.  Exam reveals no gallop and no friction rub.   Murmur (1/6 SEM) heard. Trace LE edema L>R. No calf TTP  Pulmonary/Chest: Effort normal and breath sounds normal. No respiratory distress. He has no wheezes. He has no rales.  Neurological: He is alert. Gait (uses cane to ambulate) abnormal.  Left hemiparesis  Skin: Skin is warm and dry. No rash noted.  Psychiatric: He has a normal mood and affect. His behavior is normal. Thought content normal.     Labs reviewed: Office Visit on 08/11/2016  Component Date Value Ref Range Status  . Sodium 08/11/2016 142  135 - 146 mmol/L Final  . Potassium 08/11/2016 3.5  3.5 - 5.3 mmol/L Final  . Chloride 08/11/2016 105  98 - 110 mmol/L Final  . CO2 08/11/2016 22  20 - 31 mmol/L Final  . Glucose, Bld 08/11/2016 89  65 - 99 mg/dL Final  . BUN 08/11/2016 16  7 - 25 mg/dL Final  . Creat 08/11/2016 1.02  0.70 - 1.33 mg/dL Final   Comment:   For patients > or = 57 years of age: The upper reference limit for Creatinine is approximately 13% higher for people identified as African-American.     . Total Bilirubin 08/11/2016 0.5  0.2 - 1.2 mg/dL Final  . Alkaline Phosphatase 08/11/2016 76  40 - 115 U/L Final  . AST 08/11/2016 21  10 - 35 U/L Final  . ALT 08/11/2016 20  9 - 46 U/L Final  . Total Protein 08/11/2016 7.7  6.1 - 8.1 g/dL Final  . Albumin 08/11/2016 4.7  3.6 - 5.1 g/dL Final  . Calcium 08/11/2016 9.9  8.6 - 10.3 mg/dL Final  . GFR, Est African American 08/11/2016 >89  >=60 mL/min Final  . GFR, Est Non African American 08/11/2016 81  >=60 mL/min Final  . Cholesterol 08/11/2016 212* 125 - 200 mg/dL Final  . Triglycerides 08/11/2016 134  <150 mg/dL Final  . HDL 08/11/2016 57  >=40 mg/dL Final  . Total CHOL/HDL Ratio 08/11/2016 3.7  <=5.0 Ratio Final  . VLDL 08/11/2016 27   <30 mg/dL Final  . LDL Cholesterol 08/11/2016 128  <130 mg/dL Final   Comment:   Total Cholesterol/HDL Ratio:CHD Risk                        Coronary Heart Disease Risk Table                                        Men       Women          1/2 Average Risk              3.4  3.3              Average Risk              5.0        4.4           2X Average Risk              9.6        7.1           3X Average Risk             23.4       11.0 Use the calculated Patient Ratio above and the CHD Risk table  to determine the patient's CHD Risk.   . Hgb A1c MFr Bld 08/12/2016 5.6  <5.7 % Final   Comment:   For the purpose of screening for the presence of diabetes:   <5.7%       Consistent with the absence of diabetes 5.7-6.4 %   Consistent with increased risk for diabetes (prediabetes) >=6.5 %     Consistent with diabetes   This assay result is consistent with a decreased risk of diabetes.   Currently, no consensus exists regarding use of hemoglobin A1c for diagnosis of diabetes in children.   According to American Diabetes Association (ADA) guidelines, hemoglobin A1c <7.0% represents optimal control in non-pregnant diabetic patients. Different metrics may apply to specific patient populations. Standards of Medical Care in Diabetes (ADA).     . Mean Plasma Glucose 08/12/2016 114  mg/dL Final  . Color, Urine 08/12/2016 DARK YELLOW  YELLOW Final  . APPearance 08/12/2016 CLEAR  CLEAR Final  . Specific Gravity, Urine 08/12/2016 1.028  1.001 - 1.035 Final  . pH 08/12/2016 5.5  5.0 - 8.0 Final  . Glucose, UA 08/12/2016 NEGATIVE  NEGATIVE Final  . Bilirubin Urine 08/12/2016 NEGATIVE  NEGATIVE Final  . Ketones, ur 08/12/2016 NEGATIVE  NEGATIVE Final  . Hgb urine dipstick 08/12/2016 NEGATIVE  NEGATIVE Final  . Protein, ur 08/12/2016 2+* NEGATIVE Final  . Nitrite 08/12/2016 NEGATIVE  NEGATIVE Final  . Leukocytes, UA 08/12/2016 NEGATIVE  NEGATIVE Final  . WBC, UA 08/12/2016 0-5  <=5  WBC/HPF Final  . RBC / HPF 08/12/2016 0-2  <=2 RBC/HPF Final  . Squamous Epithelial / LPF 08/12/2016 0-5  <=5 HPF Final  . Bacteria, UA 08/12/2016 NONE SEEN  NONE SEEN HPF Final  . Crystals 08/12/2016 NONE SEEN  NONE SEEN HPF Final  . Casts 08/12/2016 NONE SEEN  NONE SEEN LPF Final  . Yeast 08/12/2016 NONE SEEN  NONE SEEN HPF Final    No results found.   Assessment/Plan   ICD-9-CM ICD-10-CM   1. Essential hypertension 401.9 I10   2. Type II diabetes mellitus with neurological manifestations (HCC) 250.60 E11.49 Hemoglobin A1c     BMP with eGFR     ALT  3. Dyslipidemia associated with type 2 diabetes mellitus (HCC) 250.80 E11.69 Lipid Panel   272.4 E78.5   4.      Hx stroke with residual deficits  Limit salt use to less than 2 grams per day  Continue current medications as ordered  Flu shot given today  Follow up in 3 mos for routine visit. Fasting labs prior to appt    S. , D. O., F. A. C. O. I.  [facility] [address] [place], [place] [address] [phone] Cell (Monday-Friday 8 AM - 5 PM) [phone] After 5 PM and follow prompts   

## 2016-09-22 NOTE — Patient Instructions (Signed)
Limit salt use to less than 2 grams per day  Continue current medications as ordered  Flu shot given today  Follow up in 3 mos for routine visit. Fasting labs prior to appt

## 2016-10-06 ENCOUNTER — Encounter: Payer: Self-pay | Admitting: Gastroenterology

## 2016-10-06 ENCOUNTER — Ambulatory Visit (AMBULATORY_SURGERY_CENTER): Payer: Medicare Other | Admitting: Gastroenterology

## 2016-10-06 VITALS — BP 161/89 | HR 65 | Temp 97.5°F | Resp 30 | Ht 74.0 in | Wt 263.0 lb

## 2016-10-06 DIAGNOSIS — F0390 Unspecified dementia without behavioral disturbance: Secondary | ICD-10-CM | POA: Diagnosis not present

## 2016-10-06 DIAGNOSIS — D123 Benign neoplasm of transverse colon: Secondary | ICD-10-CM

## 2016-10-06 DIAGNOSIS — Z1211 Encounter for screening for malignant neoplasm of colon: Secondary | ICD-10-CM | POA: Diagnosis not present

## 2016-10-06 DIAGNOSIS — K219 Gastro-esophageal reflux disease without esophagitis: Secondary | ICD-10-CM | POA: Diagnosis not present

## 2016-10-06 DIAGNOSIS — Z8673 Personal history of transient ischemic attack (TIA), and cerebral infarction without residual deficits: Secondary | ICD-10-CM | POA: Diagnosis not present

## 2016-10-06 DIAGNOSIS — D128 Benign neoplasm of rectum: Secondary | ICD-10-CM

## 2016-10-06 DIAGNOSIS — Z1212 Encounter for screening for malignant neoplasm of rectum: Secondary | ICD-10-CM | POA: Diagnosis not present

## 2016-10-06 DIAGNOSIS — K621 Rectal polyp: Secondary | ICD-10-CM

## 2016-10-06 DIAGNOSIS — D129 Benign neoplasm of anus and anal canal: Secondary | ICD-10-CM

## 2016-10-06 DIAGNOSIS — I10 Essential (primary) hypertension: Secondary | ICD-10-CM | POA: Diagnosis not present

## 2016-10-06 HISTORY — PX: COLONOSCOPY: SHX174

## 2016-10-06 MED ORDER — SODIUM CHLORIDE 0.9 % IV SOLN: 500.0000 mL | INTRAVENOUS | Status: DC

## 2016-10-06 NOTE — Op Note (Signed)
[facility] Patient Name: [name] [name] Procedure Date: 10/06/2016 3:00 PM MRN: [id number] Endoscopist: Ladene Artist , MD Age: 55 Referring MD:  Date of Birth: 04-Apr-1961 Gender: Male Account #: [id number] Procedure:                Colonoscopy Indications:              Screening for colorectal malignant neoplasm Medicines:                Monitored Anesthesia Care Procedure:                Pre-Anesthesia Assessment:                           - Prior to the procedure, a History and Physical                            was performed, and patient medications and                            allergies were reviewed. The patient's tolerance of                            previous anesthesia was also reviewed. The risks                            and benefits of the procedure and the sedation                            options and risks were discussed with the patient.                            All questions were answered, and informed consent                            was obtained. Prior Anticoagulants: The patient has                            taken no previous anticoagulant or antiplatelet                            agents. ASA Grade Assessment: III - A patient with                            severe systemic disease. After reviewing the risks                            and benefits, the patient was deemed in                            satisfactory condition to undergo the procedure.                           After obtaining informed consent, the colonoscope  was passed under direct vision. Throughout the                            procedure, the patient's blood pressure, pulse, and                            oxygen saturations were monitored continuously. The                            Model PCF-H190L [id number]) scope was introduced                            through the anus and advanced to the the cecum,                            identified by  appendiceal orifice and ileocecal                            valve. The ileocecal valve, appendiceal orifice,                            and rectum were photographed. The quality of the                            bowel preparation was unsatisfactory. The                            colonoscopy was performed without difficulty. The                            patient tolerated the procedure well. Scope In: 3:13:49 PM Scope Out: 3:34:58 PM Scope Withdrawal Time: 0 hours 18 minutes 57 seconds  Total Procedure Duration: 0 hours 21 minutes 9 seconds  Findings:                 The perianal and digital rectal examinations were                            normal.                           A 5 mm polyp was found in the rectum. The polyp was                            sessile. The polyp was removed with a cold biopsy                            forceps. Resection and retrieval were complete.                           Two sessile polyps were found in the rectum. The                            polyps were 6 to 7 mm in size. These  polyps were                            removed with a hot snare. Resection and retrieval                            were complete.                           Internal hemorrhoids were found during                            retroflexion. The hemorrhoids were medium-sized and                            Grade I (internal hemorrhoids that do not prolapse).                           Four sessile polyps were found in the rectum and                            transverse colon. The polyps were 5 to 7 mm in                            size. These polyps were removed with a cold snare.                            Resection and retrieval were complete.                           The exam was otherwise without abnormality on                            direct and retroflexion views. Complications:            No immediate complications. Estimated blood loss:                             None. Estimated Blood Loss:     Estimated blood loss: none. Impression:               - Preparation of the colon was unsatisfactory.                           - One 5 mm polyp in the rectum, removed with a cold                            biopsy forceps. Resected and retrieved.                           - Two 6 to 7 mm polyps in the rectum, removed with                            a hot snare. Resected and retrieved.                           -  Internal hemorrhoids.                           - Four 5 to 7 mm polyps in the rectum and in the                            transverse colon, removed with a cold snare.                            Resected and retrieved.                           - The examination was otherwise normal on direct                            and retroflexion views. Recommendation:           - Repeat colonoscopy in 1 year with a 2 days prep                            because the bowel preparation was suboptimal.                           - Patient has a contact number available for                            emergencies. The signs and symptoms of potential                            delayed complications were discussed with the                            patient. Return to normal activities tomorrow.                            Written discharge instructions were provided to the                            patient.                           - Resume previous diet.                           - Continue present medications.                           - Await pathology results.                           - No aspirin, ibuprofen, naproxen, or other                            non-steroidal anti-inflammatory drugs for 2 weeks  after polyp removal. Ladene Artist, MD 10/06/2016 3:40:00 PM This report has been signed electronically.

## 2016-10-06 NOTE — Patient Instructions (Signed)
YOU HAD AN ENDOSCOPIC PROCEDURE TODAY AT [facility]:   Refer to the procedure report that was given to you for any specific questions about what was found during the examination.  If the procedure report does not answer your questions, please call your gastroenterologist to clarify.  If you requested that your care partner not be given the details of your procedure findings, then the procedure report has been included in a sealed envelope for you to review at your convenience later.  YOU SHOULD EXPECT: Some feelings of bloating in the abdomen. Passage of more gas than usual.  Walking can help get rid of the air that was put into your GI tract during the procedure and reduce the bloating. If you had a lower endoscopy (such as a colonoscopy or flexible sigmoidoscopy) you may notice spotting of blood in your stool or on the toilet paper. If you underwent a bowel prep for your procedure, you may not have a normal bowel movement for a few days.  Please Note:  You might notice some irritation and congestion in your nose or some drainage.  This is from the oxygen used during your procedure.  There is no need for concern and it should clear up in a day or so.  SYMPTOMS TO REPORT IMMEDIATELY:   Following lower endoscopy (colonoscopy or flexible sigmoidoscopy):  Excessive amounts of blood in the stool  Significant tenderness or worsening of abdominal pains  Swelling of the abdomen that is new, acute  Fever of 100F or higher   Following upper endoscopy (EGD)  Vomiting of blood or coffee ground material  New chest pain or pain under the shoulder blades  Painful or persistently difficult swallowing  New shortness of breath  Fever of 100F or higher  Black, tarry-looking stools  For urgent or emergent issues, a gastroenterologist can be reached at any hour by calling [phone].   DIET:  We do recommend a small meal at first, but then you may proceed to your regular diet.  Drink  plenty of fluids but you should avoid alcoholic beverages for 24 hours.  ACTIVITY:  You should plan to take it easy for the rest of today and you should NOT DRIVE or use heavy machinery until tomorrow (because of the sedation medicines used during the test).     No NSAIDS, IBuprofen, motrin for 2 weeks. Repeat colonoscopy in 1 year. FOLLOW UP: Our staff will call the number listed on your records the next business day following your procedure to check on you and address any questions or concerns that you may have regarding the information given to you following your procedure. If we do not reach you, we will leave a message.  However, if you are feeling well and you are not experiencing any problems, there is no need to return our call.  We will assume that you have returned to your regular daily activities without incident.  If any biopsies were taken you will be contacted by phone or by letter within the next 1-3 weeks.  Please call us at [phone] if you have not heard about the biopsies in 3 weeks.    SIGNATURES/CONFIDENTIALITY: You and/or your care partner have signed paperwork which will be entered into your electronic medical record.  These signatures attest to the fact that that the information above on your After Visit Summary has been reviewed and is understood.  Full responsibility of the confidentiality of this discharge information lies with you and/or your  care-partner.  Thank you for letting us take care of your healthcare needs today.

## 2016-10-06 NOTE — Progress Notes (Signed)
Called to room to assist during endoscopic procedure.  Patient ID and intended procedure confirmed with present staff. Received instructions for my participation in the procedure from the performing physician.  

## 2016-10-06 NOTE — Progress Notes (Signed)
Report to PACU, RN, vss, BBS= Clear.  

## 2016-10-07 ENCOUNTER — Telehealth: Payer: Self-pay | Admitting: *Deleted

## 2016-10-07 NOTE — Telephone Encounter (Signed)
  Follow up Call-  Call back number 10/06/2016  Post procedure Call Back phone  # [phone]  Permission to leave phone message Yes  Some recent data might be hidden     Patient questions:  Do you have a fever, pain , or abdominal swelling? No. Pain Score  0 *  Have you tolerated food without any problems? Yes.    Have you been able to return to your normal activities? Yes.    Do you have any questions about your discharge instructions: Diet   No. Medications  No. Follow up visit  No.  Do you have questions or concerns about your Care? No.  Actions: * If pain score is 4 or above: No action needed, pain <4.

## 2016-10-18 ENCOUNTER — Encounter: Payer: Self-pay | Admitting: Gastroenterology

## 2016-11-26 ENCOUNTER — Ambulatory Visit: Payer: Medicare Other

## 2016-12-02 ENCOUNTER — Telehealth: Payer: Self-pay | Admitting: Internal Medicine

## 2016-12-02 NOTE — Telephone Encounter (Signed)
left msg asking pt to confirm this rescheduled AWV appt w/ nurse. I called the pt's daughter first and she asked me to call him. VDM (DD)

## 2016-12-10 ENCOUNTER — Ambulatory Visit (INDEPENDENT_AMBULATORY_CARE_PROVIDER_SITE_OTHER): Payer: Medicare HMO

## 2016-12-10 VITALS — BP 150/84 | HR 98 | Temp 97.6°F | Ht 74.0 in | Wt 273.0 lb

## 2016-12-10 DIAGNOSIS — Z23 Encounter for immunization: Secondary | ICD-10-CM | POA: Diagnosis not present

## 2016-12-10 DIAGNOSIS — Z0000 Encounter for general adult medical examination without abnormal findings: Secondary | ICD-10-CM | POA: Diagnosis not present

## 2016-12-10 NOTE — Progress Notes (Signed)
Quick Notes   Health Maintenance:  Pn23 given today; Due for Hep C and foot exam     Abnormal Screen: None; MMSE not performed, pt not of age     Patient Concerns:  Pt is ready to quit smoking and would like to discuss it with you at his next visit on 12/24/16.     Nurse Concerns:  None

## 2016-12-10 NOTE — Patient Instructions (Addendum)
[name] [name] , Thank you for taking time to come for your Medicare Wellness Visit. I appreciate your ongoing commitment to your health goals. Please review the following plan we discussed and let me know if I can assist you in the future.   These are the goals we discussed: Goals    . Increase water intake          Starting 12/10/16, I will attempt to increase my water intake from 2-16 oz bottles of water per day to 5-16 oz. Bottles of water.        This is a list of the screening recommended for you and due dates:  Health Maintenance  Topic Date Due  .  Hepatitis C: One time screening is recommended by Center for Disease Control  (CDC) for  adults born from [birth year] through 1967.   [birth year]  . Pneumococcal vaccine (1) 11/09/1960  . Complete foot exam   11/09/1968  . Eye exam for diabetics  11/09/1968  . Tetanus Vaccine  08/17/2017*  . Hemoglobin A1C  02/09/2017  . Colon Cancer Screening  10/06/2017  . Flu Shot  Completed  . HIV Screening  Completed  *Topic was postponed. The date shown is not the original due date.  Preventive Care for Adults  A healthy lifestyle and preventive care can promote health and wellness. Preventive health guidelines for adults include the following key practices.  . A routine yearly physical is a good way to check with your health care provider about your health and preventive screening. It is a chance to share any concerns and updates on your health and to receive a thorough exam.  . Visit your dentist for a routine exam and preventive care every 6 months. Brush your teeth twice a day and floss once a day. Good oral hygiene prevents tooth decay and gum disease.  . The frequency of eye exams is based on your age, health, family medical history, use  of contact lenses, and other factors. Follow your health care provider's ecommendations for frequency of eye exams.  . Eat a healthy diet. Foods like vegetables, fruits, whole grains, low-fat dairy products, and  lean protein foods contain the nutrients you need without too many calories. Decrease your intake of foods high in solid fats, added sugars, and salt. Eat the right amount of calories for you. Get information about a proper diet from your health care provider, if necessary.  . Regular physical exercise is one of the most important things you can do for your health. Most adults should get at least 150 minutes of moderate-intensity exercise (any activity that increases your heart rate and causes you to sweat) each week. In addition, most adults need muscle-strengthening exercises on 2 or more days a week.  Silver Sneakers may be a benefit available to you. To determine eligibility, you may visit the website: www.silversneakers.com or contact program at 737-672-3430 Mon-Fri between 8AM-8PM.   . Maintain a healthy weight. The body mass index (BMI) is a screening tool to identify possible weight problems. It provides an estimate of body fat based on height and weight. Your health care provider can find your BMI and can help you achieve or maintain a healthy weight.   For adults 20 years and older: ? A BMI below 18.5 is considered underweight. ? A BMI of 18.5 to 24.9 is normal. ? A BMI of 25 to 29.9 is considered overweight. ? A BMI of 30 and above is considered obese.   . Maintain normal  blood lipids and cholesterol levels by exercising and minimizing your intake of saturated fat. Eat a balanced diet with plenty of fruit and vegetables. Blood tests for lipids and cholesterol should begin at age 92 and be repeated every 5 years. If your lipid or cholesterol levels are high, you are over 50, or you are at high risk for heart disease, you may need your cholesterol levels checked more frequently. Ongoing high lipid and cholesterol levels should be treated with medicines if diet and exercise are not working.  . If you smoke, find out from your health care provider how to quit. If you do not use tobacco,  please do not start.  . If you choose to drink alcohol, please do not consume more than 2 drinks per day. One drink is considered to be 12 ounces (355 mL) of beer, 5 ounces (148 mL) of wine, or 1.5 ounces (44 mL) of liquor.  . If you are 76-50 years old, ask your health care provider if you should take aspirin to prevent strokes.  . Use sunscreen. Apply sunscreen liberally and repeatedly throughout the day. You should seek shade when your shadow is shorter than you. Protect yourself by wearing long sleeves, pants, a wide-brimmed hat, and sunglasses year round, whenever you are outdoors.  . Once a month, do a whole body skin exam, using a mirror to look at the skin on your back. Tell your health care provider of new moles, moles that have irregular borders, moles that are larger than a pencil eraser, or moles that have changed in shape or color.

## 2016-12-10 NOTE — Progress Notes (Signed)
Subjective:   [name] [name] is a 56 y.o. male who presents for an Initial Medicare Annual Wellness Visit.  Review of Systems: Cardiac Risk Factors include: advanced age (>26men, >38 women);diabetes mellitus;dyslipidemia;hypertension;male gender;sedentary lifestyle;smoking/ tobacco exposure;obesity (BMI >30kg/m2)    Objective:    Today's Vitals   12/10/16 1109  BP: (!) 150/84  Pulse: 98  Temp: 97.6 F (36.4 C)  TempSrc: Oral  SpO2: 96%  Weight: 273 lb (123.8 kg)  Height: 6\' 2"  (1.88 m)   Body mass index is 35.05 kg/m.  Current Medications (verified) Outpatient Encounter Prescriptions as of 12/10/2016  Medication Sig  . amLODipine (NORVASC) 10 MG tablet Take 1 tablet (10 mg total) by mouth daily.  Marland Kitchen aspirin 325 MG tablet Take 1 tablet (325 mg total) by mouth daily.  Marland Kitchen atorvastatin (LIPITOR) 80 MG tablet Take 1 tablet (80 mg total) by mouth daily.  . cloNIDine (CATAPRES) 0.1 MG tablet TAKE 1 TABLET BY MOUTH EVERY 8 HOURS AS NEEDED FOR HYPERTENSION  . donepezil (ARICEPT) 10 MG tablet Take 1 tablet (10 mg total) by mouth at bedtime.  . fenofibrate micronized (ANTARA) 43 MG capsule Take 1 capsule (43 mg total) by mouth 2 (two) times daily.  Marland Kitchen FLUoxetine (PROZAC) 10 MG capsule Take 1 capsule (10 mg total) by mouth daily.  Marland Kitchen HYDROcodone-acetaminophen (NORCO) 5-325 MG tablet Take 1 tablet by mouth every 6 (six) hours as needed for moderate pain.  Marland Kitchen lisinopril (PRINIVIL,ZESTRIL) 20 MG tablet Take 1 tablet (20 mg total) by mouth daily.  . pantoprazole (PROTONIX) 40 MG tablet Take 1 tablet (40 mg total) by mouth 2 (two) times daily.   Facility-Administered Encounter Medications as of 12/10/2016  Medication  . 0.9 %  sodium chloride infusion    Allergies (verified) Patient has no known allergies.   History: Past Medical History:  Diagnosis Date  . Acute respiratory failure ([facility])   . Cerebral edema ([facility])   . Diabetes mellitus without complication ([facility])    diet controlled- no  meds  . Dysphagia as late effect of cerebrovascular disease   . Fall at home   . Hemiparesis affecting left side as late effect of stroke ([facility])   . Hyperlipidemia   . Hypertension   . Stroke ([facility]) 2016  . Tuberculosis    11 yrs ago per pt.- treated with meds per pt.   Past Surgical History:  Procedure Laterality Date  . ESOPHAGOGASTRODUODENOSCOPY N/A 08/12/2015   Procedure: ESOPHAGOGASTRODUODENOSCOPY (EGD);  Surgeon: Ladene Artist, MD;  Location: [facility];  Service: Endoscopy;  Laterality: N/A;  . feeding tube removed    . GASTROSTOMY TUBE PLACEMENT  05-19-15  . mechanical thrombectomy angioplasty stenet placement    . UPPER GASTROINTESTINAL ENDOSCOPY     Family History  Problem Relation Age of Onset  . Alcohol abuse Sister   . Alcohol abuse Brother   . Alcohol abuse Father   . Colon cancer Neg Hx   . Colon polyps Neg Hx   . Rectal cancer Neg Hx   . Stomach cancer Neg Hx   . Esophageal cancer Neg Hx    Social History   Occupational History  . Not on file.   Social History Main Topics  . Smoking status: Current Every Day Smoker    Packs/day: 1.00    Types: Cigarettes  . Smokeless tobacco: Never Used  . Alcohol use No  . Drug use: No  . Sexual activity: Yes   Tobacco Counseling Ready to quit: Yes Counseling given:  Yes   Activities of Daily Living In your present state of health, do you have any difficulty performing the following activities: 12/10/2016  Hearing? N  Vision? N  Difficulty concentrating or making decisions? N  Walking or climbing stairs? Y  Dressing or bathing? N  Doing errands, shopping? Y  Preparing Food and eating ? N  Using the Toilet? N  In the past six months, have you accidently leaked urine? Y  Do you have problems with loss of bowel control? N  Managing your Medications? N  Managing your Finances? N  Housekeeping or managing your Housekeeping? Y  Some recent data might be hidden    Immunizations and Health  Maintenance Immunization History  Administered Date(s) Administered  . Influenza,inj,Quad PF,36+ Mos 08/10/2015, 09/22/2016   Health Maintenance Due  Topic Date Due  . Hepatitis C Screening  [birth year]  . PNEUMOCOCCAL POLYSACCHARIDE VACCINE (1) 11/09/1960  . FOOT EXAM  11/09/1968  . OPHTHALMOLOGY EXAM  11/09/1968    Patient Care Team: Gildardo Cranker, DO as PCP - General (Internal Medicine) Gerlene Fee, NP as Nurse Practitioner ([place]) [facility] ([place])  Indicate any recent Medical Services you may have received from other than [facility] providers in the past year (date may be approximate).    Assessment:   This is a routine wellness examination for [name] [name].   Hearing/Vision screen Hearing Screening Comments: Pt has never had a hearing screen done. Denies any problems with hearing at this time.  Vision Screening Comments: Last eye exam was done a little over a year ago, when pt was staying at [facility]. Denies any issues with seeing at this time.   Dietary issues and exercise activities discussed: Current Exercise Habits: The patient does not participate in regular exercise at present, Exercise limited by: orthopedic condition(s)  Goals    . Increase water intake          Starting 12/10/16, I will attempt to increase my water intake from 2-16 oz bottles of water per day to 5-16 oz. Bottles of water.       Depression Screen PHQ 2/9 Scores 12/10/2016 01/28/2016  PHQ - 2 Score 0 0    Fall Risk Fall Risk  12/10/2016 09/22/2016 08/11/2016 05/07/2016 01/28/2016  Falls in the past year? No No No No No  Risk for fall due to : Impaired mobility;Impaired balance/gait - - - -    Cognitive Function: MMSE - Mini Mental State Exam 12/10/2016  Not completed: Unable to complete        Screening Tests Health Maintenance  Topic Date Due  . Hepatitis C Screening  10/05/1961  . PNEUMOCOCCAL POLYSACCHARIDE VACCINE (1) 11/09/1960  . FOOT EXAM   11/09/1968  . OPHTHALMOLOGY EXAM  11/09/1968  . TETANUS/TDAP  08/17/2017 (Originally 11/09/1977)  . HEMOGLOBIN A1C  02/09/2017  . COLONOSCOPY  10/06/2017  . INFLUENZA VACCINE  Completed  . HIV Screening  Completed        Plan:    I have personally reviewed and addressed the Medicare Annual Wellness questionnaire and have noted the following in the patient's chart:  A. Medical and social history B. Use of alcohol, tobacco or illicit drugs  C. Current medications and supplements D. Functional ability and status E.  Nutritional status F.  Physical activity G. Advance directives H. List of other physicians I.  Hospitalizations, surgeries, and ER visits in previous 12 months J.  [place] to include hearing, vision, cognitive, depression L. Referrals  and appointments - none  In addition, I have reviewed and discussed with patient certain preventive protocols, quality metrics, and best practice recommendations. A written personalized care plan for preventive services as well as general preventive health recommendations were provided to patient.  See attached scanned questionnaire for additional information.   Signed,   Allyn Kenner, LPN Health Advisor   I have reviewed the health advisor's note and was available for consultation. I agree with documentation and plan.   Clela Hagadorn S. Perlie Gold  [facility] [address] [place], [place] [address] [phone] Cell (Monday-Friday 8 AM - 5 PM) [phone] After 5 PM and follow prompts

## 2016-12-12 ENCOUNTER — Other Ambulatory Visit: Payer: Self-pay | Admitting: Internal Medicine

## 2016-12-12 DIAGNOSIS — E785 Hyperlipidemia, unspecified: Secondary | ICD-10-CM

## 2016-12-12 DIAGNOSIS — E1169 Type 2 diabetes mellitus with other specified complication: Secondary | ICD-10-CM

## 2016-12-12 DIAGNOSIS — I10 Essential (primary) hypertension: Secondary | ICD-10-CM

## 2016-12-21 ENCOUNTER — Other Ambulatory Visit: Payer: Medicare Other

## 2016-12-24 ENCOUNTER — Encounter: Payer: Self-pay | Admitting: Internal Medicine

## 2016-12-24 ENCOUNTER — Ambulatory Visit (INDEPENDENT_AMBULATORY_CARE_PROVIDER_SITE_OTHER): Payer: Medicare HMO | Admitting: Internal Medicine

## 2016-12-24 VITALS — BP 160/90 | HR 59 | Temp 97.7°F | Ht 74.0 in | Wt 272.6 lb

## 2016-12-24 DIAGNOSIS — E1149 Type 2 diabetes mellitus with other diabetic neurological complication: Secondary | ICD-10-CM | POA: Diagnosis not present

## 2016-12-24 DIAGNOSIS — I639 Cerebral infarction, unspecified: Secondary | ICD-10-CM

## 2016-12-24 DIAGNOSIS — G8110 Spastic hemiplegia affecting unspecified side: Secondary | ICD-10-CM | POA: Diagnosis not present

## 2016-12-24 DIAGNOSIS — E785 Hyperlipidemia, unspecified: Secondary | ICD-10-CM

## 2016-12-24 DIAGNOSIS — F0631 Mood disorder due to known physiological condition with depressive features: Secondary | ICD-10-CM

## 2016-12-24 DIAGNOSIS — Z720 Tobacco use: Secondary | ICD-10-CM | POA: Diagnosis not present

## 2016-12-24 DIAGNOSIS — I6930 Unspecified sequelae of cerebral infarction: Secondary | ICD-10-CM

## 2016-12-24 DIAGNOSIS — Z6835 Body mass index (BMI) 35.0-35.9, adult: Secondary | ICD-10-CM

## 2016-12-24 DIAGNOSIS — I10 Essential (primary) hypertension: Secondary | ICD-10-CM

## 2016-12-24 DIAGNOSIS — E6609 Other obesity due to excess calories: Secondary | ICD-10-CM | POA: Diagnosis not present

## 2016-12-24 DIAGNOSIS — E1169 Type 2 diabetes mellitus with other specified complication: Secondary | ICD-10-CM

## 2016-12-24 DIAGNOSIS — IMO0002 Reserved for concepts with insufficient information to code with codable children: Secondary | ICD-10-CM

## 2016-12-24 DIAGNOSIS — IMO0001 Reserved for inherently not codable concepts without codable children: Secondary | ICD-10-CM

## 2016-12-24 LAB — LIPID PANEL
CHOL/HDL RATIO: 2.8 ratio (ref <5.0)
CHOLESTEROL: 142 mg/dL (ref <200)
HDL: 50 mg/dL (ref >40)
LDL Cholesterol: 66 mg/dL (ref <100)
TRIGLYCERIDES: 129 mg/dL (ref <150)
VLDL: 26 mg/dL (ref <30)

## 2016-12-24 LAB — BASIC METABOLIC PANEL WITH GFR
BUN: 17 mg/dL (ref 7–25)
CALCIUM: 9.4 mg/dL (ref 8.6–10.3)
CO2: 24 mmol/L (ref 20–31)
Chloride: 103 mmol/L (ref 98–110)
Creat: 1.15 mg/dL (ref 0.70–1.33)
GFR, Est African American: 81 mL/min (ref >=60)
GFR, Est Non African American: 70 mL/min (ref >=60)
GLUCOSE: 93 mg/dL (ref 65–99)
Potassium: 3.5 mmol/L (ref 3.5–5.3)
Sodium: 141 mmol/L (ref 135–146)

## 2016-12-24 LAB — ALT: ALT: 33 U/L (ref 9–46)

## 2016-12-24 NOTE — Patient Instructions (Signed)
Continue current medications as ordered  RECOMMEND 1500 CALORIES PER DAY FOR WEIGHT LOSS AND INCREASE EXERCISE AS TOLERATED  Will call with lab results  Follow up with specialists as scheduled  Smoking cessation discussed and highly urged  Follow up in 3 mos for routine visit  Calorie Counting for Weight Loss Calories are energy you get from the things you eat and drink. Your body uses this energy to keep you going throughout the day. The number of calories you eat affects your weight. When you eat more calories than your body needs, your body stores the extra calories as fat. When you eat fewer calories than your body needs, your body burns fat to get the energy it needs. Calorie counting means keeping track of how many calories you eat and drink each day. If you make sure to eat fewer calories than your body needs, you should lose weight. In order for calorie counting to work, you will need to eat the number of calories that are right for you in a day to lose a healthy amount of weight per week. A healthy amount of weight to lose per week is usually 1-2 lb (0.5-0.9 kg). A dietitian can determine how many calories you need in a day and give you suggestions on how to reach your calorie goal.  WHAT IS MY MY PLAN? My goal is to have ____1500______ calories per day.  WHAT DO I NEED TO KNOW ABOUT CALORIE COUNTING? In order to meet your daily calorie goal, you will need to:  Find out how many calories are in each food you would like to eat. Try to do this before you eat.  Decide how much of the food you can eat.  Write down what you ate and how many calories it had. Doing this is called keeping a food log. WHERE DO I FIND CALORIE INFORMATION? The number of calories in a food can be found on a Nutrition Facts label. Note that all the information on a label is based on a specific serving of the food. If a food does not have a Nutrition Facts label, try to look up the calories online or ask your  dietitian for help. HOW DO I DECIDE HOW MUCH TO EAT? To decide how much of the food you can eat, you will need to consider both the number of calories in one serving and the size of one serving. This information can be found on the Nutrition Facts label. If a food does not have a Nutrition Facts label, look up the information online or ask your dietitian for help. Remember that calories are listed per serving. If you choose to have more than one serving of a food, you will have to multiply the calories per serving by the amount of servings you plan to eat. For example, the label on a package of bread might say that a serving size is 1 slice and that there are 90 calories in a serving. If you eat 1 slice, you will have eaten 90 calories. If you eat 2 slices, you will have eaten 180 calories. HOW DO I KEEP A FOOD LOG? After each meal, record the following information in your food log:  What you ate.  How much of it you ate.  How many calories it had.  Then, add up your calories. Keep your food log near you, such as in a small notebook in your pocket. Another option is to use a mobile app or website. Some programs will calculate  calories for you and show you how many calories you have left each time you add an item to the log. WHAT ARE SOME CALORIE COUNTING TIPS?  Use your calories on foods and drinks that will fill you up and not leave you hungry. Some examples of this include foods like nuts and nut butters, vegetables, lean proteins, and high-fiber foods (more than 5 g fiber per serving).  Eat nutritious foods and avoid empty calories. Empty calories are calories you get from foods or beverages that do not have many nutrients, such as candy and soda. It is better to have a nutritious high-calorie food (such as an avocado) than a food with few nutrients (such as a bag of chips).  Know how many calories are in the foods you eat most often. This way, you do not have to look up how many calories  they have each time you eat them.  Look out for foods that may seem like low-calorie foods but are really high-calorie foods, such as baked goods, soda, and fat-free candy.  Pay attention to calories in drinks. Drinks such as sodas, specialty coffee drinks, alcohol, and juices have a lot of calories yet do not fill you up. Choose low-calorie drinks like water and diet drinks.  Focus your calorie counting efforts on higher calorie items. Logging the calories in a garden salad that contains only vegetables is less important than calculating the calories in a milk shake.  Find a way of tracking calories that works for you. Get creative. Most people who are successful find ways to keep track of how much they eat in a day, even if they do not count every calorie. WHAT ARE SOME PORTION CONTROL TIPS?  Know how many calories are in a serving. This will help you know how many servings of a certain food you can have.  Use a measuring cup to measure serving sizes. This is helpful when you start out. With time, you will be able to estimate serving sizes for some foods.  Take some time to put servings of different foods on your favorite plates, bowls, and cups so you know what a serving looks like.  Try not to eat straight from a bag or box. Doing this can lead to overeating. Put the amount you would like to eat in a cup or on a plate to make sure you are eating the right portion.  Use smaller plates, glasses, and bowls to prevent overeating. This is a quick and easy way to practice portion control. If your plate is smaller, less food can fit on it.  Try not to multitask while eating, such as watching TV or using your computer. If it is time to eat, sit down at a table and enjoy your food. Doing this will help you to start recognizing when you are full. It will also make you more aware of what and how much you are eating. HOW CAN I CALORIE COUNT WHEN EATING OUT?  Ask for smaller portion sizes or  child-sized portions.  Consider sharing an entree and sides instead of getting your own entree.  If you get your own entree, eat only half. Ask for a box at the beginning of your meal and put the rest of your entree in it so you are not tempted to eat it.  Look for the calories on the menu. If calories are listed, choose the lower calorie options.  Choose dishes that include vegetables, fruits, whole grains, low-fat dairy products, and lean  protein. Focusing on smart food choices from each of the 5 food groups can help you stay on track at restaurants.  Choose items that are boiled, broiled, grilled, or steamed.  Choose water, milk, unsweetened iced tea, or other drinks without added sugars. If you want an alcoholic beverage, choose a lower calorie option. For example, a regular margarita can have up to 700 calories and a glass of wine has around 150.  Stay away from items that are buttered, battered, fried, or served with cream sauce. Items labeled "crispy" are usually fried, unless stated otherwise.  Ask for dressings, sauces, and syrups on the side. These are usually very high in calories, so do not eat much of them.  Watch out for salads. Many people think salads are a healthy option, but this is often not the case. Many salads come with bacon, fried chicken, lots of cheese, fried chips, and dressing. All of these items have a lot of calories. If you want a salad, choose a garden salad and ask for grilled meats or steak. Ask for the dressing on the side, or ask for olive oil and vinegar or lemon to use as dressing.  Estimate how many servings of a food you are given. For example, a serving of cooked rice is  cup or about the size of half a tennis ball or one cupcake wrapper. Knowing serving sizes will help you be aware of how much food you are eating at restaurants. The list below tells you how big or small some common portion sizes are based on everyday objects.  1 oz-4 stacked dice.  3  oz-1 deck of cards.  1 tsp-1 dice.  1 Tbsp- a Ping-Pong ball.  2 Tbsp-1 Ping-Pong ball.   cup-1 tennis ball or 1 cupcake wrapper.  1 cup-1 baseball. This information is not intended to replace advice given to you by your health care provider. Make sure you discuss any questions you have with your health care provider. Document Released: 10/25/2005 Document Revised: 11/15/2014 Document Reviewed: 08/30/2013 Elsevier Interactive Patient Education  2017 [employer].

## 2016-12-24 NOTE — Progress Notes (Signed)
Patient ID: [name] [name], male   DOB: 23-Apr-1961, 56 y.o.   MRN: [id number]    Location:  [facility] Place of Service: OFFICE  Chief Complaint  Patient presents with  . Medical Management of Chronic Issues    3 month routine visit    HPI:  56 yo male seen today for f/u. He has moved out of daughter's home and now lives with his brother. He continues to smoke cigarettes. He gained 8 lbs since last OV. He has bouts of insomnia especially when LUE hurting  Hypertension - BP elevated today. He has not taken meds today yet. He is on 4 drug tx (norvasc, lisinopril, clonidine and hydralazine)  Dyslipidemia- stable on lipitor and fenofibrate. TG 134; total chol 212; LDL 128   DM - diet controlled. Last a1c is 5.6%. Neuropathy resolved  Depression - stable on prozac. He reports no further depression and would like to stop med   Hx CVA with dysphagia and left hemiparesis -  no signs of aspiration present. He does not require nectar thick liquids anymore. Peg tube removed prior to d/c from SNF. Takes ASA/plavix. No neuropathy but he does have pain in LUE that is controlled with Aleve and Tylenol. Rarely takes norco. Uses cane to ambulate  H pylori infection - completed abx. Takes protonix BID. He c/o constipation. He is no longer taking senna.  Vascular dementia - stable on aricept   Past Medical History:  Diagnosis Date  . Acute respiratory failure ([place])   . Cerebral edema ([facility])   . Diabetes mellitus without complication ([facility])    diet controlled- no meds  . Dysphagia as late effect of cerebrovascular disease   . Fall at home   . Hemiparesis affecting left side as late effect of stroke ([place])   . Hyperlipidemia   . Hypertension   . Stroke ([place]) 2016  . Tuberculosis    11 yrs ago per pt.- treated with meds per pt.    Past Surgical History:  Procedure Laterality Date  . ESOPHAGOGASTRODUODENOSCOPY N/A 08/12/2015   Procedure: ESOPHAGOGASTRODUODENOSCOPY (EGD);  Surgeon: Ladene Artist, MD;   Location: [facility] ENDOSCOPY;  Service: Endoscopy;  Laterality: N/A;  . feeding tube removed    . GASTROSTOMY TUBE PLACEMENT  05-19-15  . mechanical thrombectomy angioplasty stenet placement    . UPPER GASTROINTESTINAL ENDOSCOPY      Patient Care Team: Gildardo Cranker, DO as PCP - General (Internal Medicine) Gerlene Fee, NP as Nurse Practitioner (Geriatric Medicine) [facility] ([place])  Social History   Social History  . Marital status: Single    Spouse name: N/A  . Number of children: N/A  . Years of education: N/A   Occupational History  . Not on file.   Social History Main Topics  . Smoking status: Current Every Day Smoker    Packs/day: 1.00    Types: Cigarettes  . Smokeless tobacco: Never Used  . Alcohol use No  . Drug use: No  . Sexual activity: Yes   Other Topics Concern  . Not on file   Social History Narrative   Diet?  No salt      Do you drink/eat things with caffeine? none      Marital status?                                    What year were you married?  Do you live in a house, apartment, assisted living, condo, trailer, etc.? Apartment      Is it one or more stories? One, flat      How many persons live in your home?      Do you have any pets in your home? (please list)  no      Current or past profession:      Do you exercise?      no                                Type & how often?      Do you have a living will? no      Do you have a DNR form?    no                              If not, do you want to discuss one?      Do you have signed POA/HPOA for forms?            reports that he has been smoking Cigarettes.  He has been smoking about 1.00 pack per day. He has never used smokeless tobacco. He reports that he does not drink alcohol or use drugs.  Family History  Problem Relation Age of Onset  . Alcohol abuse Sister   . Alcohol abuse Brother   . Alcohol abuse Father   . Colon cancer Neg Hx   .  Colon polyps Neg Hx   . Rectal cancer Neg Hx   . Stomach cancer Neg Hx   . Esophageal cancer Neg Hx    Family Status  Relation Status  . Mother Deceased  . Brother Alive  . Brother Alive  . Sister Alive  . Sister Alive  . Brother Deceased  . Son Alive  . Daughter Alive  . Daughter Alive  . Father Deceased  . Neg Hx      No Known Allergies  Medications: Patient's Medications  New Prescriptions   No medications on file  Previous Medications   AMLODIPINE (NORVASC) 10 MG TABLET    TAKE 1 TABLET BY MOUTH EVERY DAY   ASPIRIN 325 MG TABLET    Take 1 tablet (325 mg total) by mouth daily.   ATORVASTATIN (LIPITOR) 80 MG TABLET    TAKE 1 TABLET BY MOUTH EVERY DAY   CLONIDINE (CATAPRES) 0.1 MG TABLET    TAKE 1 TABLET BY MOUTH EVERY 8 HOURS AS NEEDED FOR HYPERTENSION   DONEPEZIL (ARICEPT) 10 MG TABLET    Take 1 tablet (10 mg total) by mouth at bedtime.   FENOFIBRATE MICRONIZED (ANTARA) 43 MG CAPSULE    Take 1 capsule (43 mg total) by mouth 2 (two) times daily.   FLUOXETINE (PROZAC) 10 MG CAPSULE    Take 1 capsule (10 mg total) by mouth daily.   HYDROCODONE-ACETAMINOPHEN (NORCO) 5-325 MG TABLET    Take 1 tablet by mouth every 6 (six) hours as needed for moderate pain.   LISINOPRIL (PRINIVIL,ZESTRIL) 20 MG TABLET    TAKE 1 TABLET BY MOUTH EVERY DAY   PANTOPRAZOLE (PROTONIX) 40 MG TABLET    Take 1 tablet (40 mg total) by mouth 2 (two) times daily.  Modified Medications   No medications on file  Discontinued Medications   No medications on file    Review of Systems  Unable to perform ROS:  Dementia    Vitals:   12/24/16 0926  BP: (!) 160/90  Pulse: (!) 59  Temp: 97.7 F (36.5 C)  TempSrc: Oral  SpO2: 96%  Weight: 272 lb 9.6 oz (123.7 kg)  Height: _0  (1.88 m)   Body mass index is 35 kg/m.  Physical Exam  Constitutional: He appears well-developed and well-nourished.  Looks well in NAD; uses cane to ambulate  HENT:  Mouth/Throat: Oropharynx is clear and moist.  Eyes:  Pupils are equal, round, and reactive to light. No scleral icterus.  Neck: Neck supple. Carotid bruit is not present. No thyromegaly present.  Cardiovascular: Normal rate, regular rhythm, normal heart sounds and intact distal pulses.  Exam reveals no gallop and no friction rub.   No murmur heard. no distal LE swelling. No calf TTP  Pulmonary/Chest: Effort normal and breath sounds normal. He has no wheezes. He has no rales. He exhibits no tenderness.  Abdominal: Soft. Bowel sounds are normal. He exhibits no distension, no abdominal bruit, no pulsatile midline mass and no mass. There is no tenderness. There is no rebound and no guarding.  No suprapubic TTP  Musculoskeletal: He exhibits edema and tenderness.  LUE/LLE swelling. No contracture  Lymphadenopathy:    He has no cervical adenopathy.  Neurological: He is alert.  LUE strength 3/5; LLE strength 4/5; gait unsteady  Skin: Skin is warm and dry. No rash noted.  Psychiatric: He has a normal mood and affect. His behavior is normal. Judgment and thought content normal.     Labs reviewed: No visits with results within 3 Month(s) from this visit.  Latest known visit with results is:  Office Visit on 08/11/2016  Component Date Value Ref Range Status  . Sodium 08/11/2016 142  135 - 146 mmol/L Final  . Potassium 08/11/2016 3.5  3.5 - 5.3 mmol/L Final  . Chloride 08/11/2016 105  98 - 110 mmol/L Final  . CO2 08/11/2016 22  20 - 31 mmol/L Final  . Glucose, Bld 08/11/2016 89  65 - 99 mg/dL Final  . BUN 08/11/2016 16  7 - 25 mg/dL Final  . Creat 08/11/2016 1.02  0.70 - 1.33 mg/dL Final   Comment:   For patients > or = 58 years of age: The upper reference limit for Creatinine is approximately 13% higher for people identified as African-American.     . Total Bilirubin 08/11/2016 0.5  0.2 - 1.2 mg/dL Final  . Alkaline Phosphatase 08/11/2016 76  40 - 115 U/L Final  . AST 08/11/2016 21  10 - 35 U/L Final  . ALT 08/11/2016 20  9 - 46 U/L Final    . Total Protein 08/11/2016 7.7  6.1 - 8.1 g/dL Final  . Albumin 08/11/2016 4.7  3.6 - 5.1 g/dL Final  . Calcium 08/11/2016 9.9  8.6 - 10.3 mg/dL Final  . GFR, Est African American 08/11/2016 >89  >=60 mL/min Final  . GFR, Est Non African American 08/11/2016 81  >=60 mL/min Final  . Cholesterol 08/11/2016 212* 125 - 200 mg/dL Final  . Triglycerides 08/11/2016 134  <150 mg/dL Final  . HDL 08/11/2016 57  >=40 mg/dL Final  . Total CHOL/HDL Ratio 08/11/2016 3.7  <=5.0 Ratio Final  . VLDL 08/11/2016 27  <30 mg/dL Final  . LDL Cholesterol 08/11/2016 128  <130 mg/dL Final   Comment:   Total Cholesterol/HDL Ratio:CHD Risk  Coronary Heart Disease Risk Table                                        Men       Women          1/2 Average Risk              3.4        3.3              Average Risk              5.0        4.4           2X Average Risk              9.6        7.1           3X Average Risk             23.4       11.0 Use the calculated Patient Ratio above and the CHD Risk table  to determine the patient's CHD Risk.   . Hgb A1c MFr Bld 08/11/2016 5.6  <5.7 % Final   Comment:   For the purpose of screening for the presence of diabetes:   <5.7%       Consistent with the absence of diabetes 5.7-6.4 %   Consistent with increased risk for diabetes (prediabetes) >=6.5 %     Consistent with diabetes   This assay result is consistent with a decreased risk of diabetes.   Currently, no consensus exists regarding use of hemoglobin A1c for diagnosis of diabetes in children.   According to American Diabetes Association (ADA) guidelines, hemoglobin A1c <7.0% represents optimal control in non-pregnant diabetic patients. Different metrics may apply to specific patient populations. Standards of Medical Care in Diabetes (ADA).     . Mean Plasma Glucose 08/11/2016 114  mg/dL Final  . Color, Urine 08/11/2016 DARK YELLOW  YELLOW Final  . APPearance 08/11/2016 CLEAR  CLEAR  Final  . Specific Gravity, Urine 08/11/2016 1.028  1.001 - 1.035 Final  . pH 08/11/2016 5.5  5.0 - 8.0 Final  . Glucose, UA 08/11/2016 NEGATIVE  NEGATIVE Final  . Bilirubin Urine 08/11/2016 NEGATIVE  NEGATIVE Final  . Ketones, ur 08/11/2016 NEGATIVE  NEGATIVE Final  . Hgb urine dipstick 08/11/2016 NEGATIVE  NEGATIVE Final  . Protein, ur 08/11/2016 2+* NEGATIVE Final  . Nitrite 08/11/2016 NEGATIVE  NEGATIVE Final  . Leukocytes, UA 08/11/2016 NEGATIVE  NEGATIVE Final  . WBC, UA 08/11/2016 0-5  <=5 WBC/HPF Final  . RBC / HPF 08/11/2016 0-2  <=2 RBC/HPF Final  . Squamous Epithelial / LPF 08/11/2016 0-5  <=5 HPF Final  . Bacteria, UA 08/11/2016 NONE SEEN  NONE SEEN HPF Final  . Crystals 08/11/2016 NONE SEEN  NONE SEEN HPF Final  . Casts 08/11/2016 NONE SEEN  NONE SEEN LPF Final  . Yeast 08/11/2016 NONE SEEN  NONE SEEN HPF Final    No results found.   Assessment/Plan   ICD-9-CM ICD-10-CM   1. Tobacco abuse 305.1 Z72.0   2. Essential hypertension 401.9 I10   3. H/O: stroke with residual effects 438.9 I69.30   4. Type II diabetes mellitus with neurological manifestations ([place]) 250.60 E11.49 Hemoglobin A1c     BMP with eGFR     ALT  5. Spastic hemiplegia affecting nondominant  side ([place]) 342.12 G81.10   6. Dyslipidemia associated with type 2 diabetes mellitus ([place]) 250.80 E11.69 Lipid Panel   272.4 E78.5   7. Depression due to stroke ([place]) 434.91 I63.9    293.83 F06.31   8. Class 2 obesity due to excess calories with serious comorbidity and body mass index (BMI) of 35.0 to 35.9 in adult 278.00 E66.09    V85.35 Z68.35    Continue current medications as ordered  RECOMMEND 1500 CALORIES PER DAY FOR WEIGHT LOSS AND INCREASE EXERCISE AS TOLERATED  Will call with lab results  Follow up with specialists as scheduled  Smoking cessation discussed and highly urged  Follow up in 3 mos for routine visit    Bellarose Burtt S. Perlie Gold  [facility] [address] [place], [place] [address] [phone] Cell (Monday-Friday 8 AM - 5 PM) [phone] After 5 PM and follow prompts

## 2016-12-25 LAB — HEMOGLOBIN A1C
Hgb A1c MFr Bld: 5.7 % — ABNORMAL HIGH (ref <5.7)
Mean Plasma Glucose: 117 mg/dL

## 2017-02-01 ENCOUNTER — Other Ambulatory Visit: Payer: Self-pay | Admitting: Internal Medicine

## 2017-03-09 ENCOUNTER — Other Ambulatory Visit: Payer: Self-pay | Admitting: Internal Medicine

## 2017-03-09 DIAGNOSIS — I10 Essential (primary) hypertension: Secondary | ICD-10-CM

## 2017-03-09 DIAGNOSIS — E785 Hyperlipidemia, unspecified: Secondary | ICD-10-CM

## 2017-03-09 DIAGNOSIS — E1169 Type 2 diabetes mellitus with other specified complication: Secondary | ICD-10-CM

## 2017-04-22 ENCOUNTER — Ambulatory Visit (INDEPENDENT_AMBULATORY_CARE_PROVIDER_SITE_OTHER): Payer: Medicare HMO | Admitting: Internal Medicine

## 2017-04-22 ENCOUNTER — Encounter: Payer: Self-pay | Admitting: Internal Medicine

## 2017-04-22 VITALS — BP 138/90 | HR 61 | Temp 98.3°F | Ht 74.0 in | Wt 279.0 lb

## 2017-04-22 DIAGNOSIS — Z1159 Encounter for screening for other viral diseases: Secondary | ICD-10-CM | POA: Diagnosis not present

## 2017-04-22 DIAGNOSIS — E785 Hyperlipidemia, unspecified: Secondary | ICD-10-CM | POA: Diagnosis not present

## 2017-04-22 DIAGNOSIS — I10 Essential (primary) hypertension: Secondary | ICD-10-CM

## 2017-04-22 DIAGNOSIS — G8110 Spastic hemiplegia affecting unspecified side: Secondary | ICD-10-CM

## 2017-04-22 DIAGNOSIS — E1169 Type 2 diabetes mellitus with other specified complication: Secondary | ICD-10-CM

## 2017-04-22 DIAGNOSIS — K5909 Other constipation: Secondary | ICD-10-CM

## 2017-04-22 DIAGNOSIS — Z9189 Other specified personal risk factors, not elsewhere classified: Secondary | ICD-10-CM

## 2017-04-22 DIAGNOSIS — E1149 Type 2 diabetes mellitus with other diabetic neurological complication: Secondary | ICD-10-CM | POA: Diagnosis not present

## 2017-04-22 DIAGNOSIS — I6930 Unspecified sequelae of cerebral infarction: Secondary | ICD-10-CM

## 2017-04-22 DIAGNOSIS — Z720 Tobacco use: Secondary | ICD-10-CM

## 2017-04-22 LAB — LIPID PANEL
CHOL/HDL RATIO: 3 ratio (ref <5.0)
Cholesterol: 135 mg/dL (ref <200)
HDL: 45 mg/dL (ref >40)
LDL CALC: 69 mg/dL (ref <100)
TRIGLYCERIDES: 107 mg/dL (ref <150)
VLDL: 21 mg/dL (ref <30)

## 2017-04-22 LAB — COMPLETE METABOLIC PANEL WITH GFR
ALT: 20 U/L (ref 9–46)
AST: 17 U/L (ref 10–35)
Albumin: 4.5 g/dL (ref 3.6–5.1)
Alkaline Phosphatase: 110 U/L (ref 40–115)
BUN: 14 mg/dL (ref 7–25)
CO2: 26 mmol/L (ref 20–31)
CREATININE: 1.21 mg/dL (ref 0.70–1.33)
Calcium: 9.5 mg/dL (ref 8.6–10.3)
Chloride: 105 mmol/L (ref 98–110)
GFR, Est African American: 76 mL/min (ref >=60)
GFR, Est Non African American: 66 mL/min (ref >=60)
Glucose, Bld: 87 mg/dL (ref 65–99)
POTASSIUM: 3.9 mmol/L (ref 3.5–5.3)
SODIUM: 141 mmol/L (ref 135–146)
Total Bilirubin: 0.5 mg/dL (ref 0.2–1.2)
Total Protein: 7.4 g/dL (ref 6.1–8.1)

## 2017-04-22 NOTE — Patient Instructions (Addendum)
Smoking cessation discussed and highly urged  Will call with lab results  Follow up with specialists as scheduled  START Linzess 72 mcg daily for constipation  Follow up in 3 mos for HTN, hx CVA, DM   Constipation, Adult Constipation is when a person:  Poops (has a bowel movement) fewer times in a week than normal.  Has a hard time pooping.  Has poop that is dry, hard, or bigger than normal.  Follow these instructions at home: Eating and drinking   Eat foods that have a lot of fiber, such as: ? Fresh fruits and vegetables. ? Whole grains. ? Beans.  Eat less of foods that are high in fat, low in fiber, or overly processed, such as: ? [place] fries. ? Hamburgers. ? Cookies. ? Candy. ? Soda.  Drink enough fluid to keep your pee (urine) clear or pale yellow. General instructions  Exercise regularly or as told by your doctor.  Go to the restroom when you feel like you need to poop. Do not hold it in.  Take over-the-counter and prescription medicines only as told by your doctor. These include any fiber supplements.  Do pelvic floor retraining exercises, such as: ? Doing deep breathing while relaxing your lower belly (abdomen). ? Relaxing your pelvic floor while pooping.  Watch your condition for any changes.  Keep all follow-up visits as told by your doctor. This is important. Contact a doctor if:  You have pain that gets worse.  You have a fever.  You have not pooped for 4 days.  You throw up (vomit).  You are not hungry.  You lose weight.  You are bleeding from the anus.  You have thin, pencil-like poop (stool). Get help right away if:  You have a fever, and your symptoms suddenly get worse.  You leak poop or have blood in your poop.  Your belly feels hard or bigger than normal (is bloated).  You have very bad belly pain.  You feel dizzy or you faint. This information is not intended to replace advice given to you by your health care  provider. Make sure you discuss any questions you have with your health care provider. Document Released: 04/12/2008 Document Revised: 05/14/2016 Document Reviewed: 04/14/2016 Elsevier Interactive Patient Education  2017 [employer].  Smoking Tobacco Information Smoking tobacco will very likely harm your health. Tobacco contains a poisonous (toxic), colorless chemical called nicotine. Nicotine affects the brain and makes tobacco addictive. This change in your brain can make it hard to stop smoking. Tobacco also has other toxic chemicals that can hurt your body and raise your risk of many cancers. How can smoking tobacco affect me? Smoking tobacco can increase your chances of having serious health conditions, such as:  Cancer. Smoking is most commonly associated with lung cancer, but can lead to cancer in other parts of the body.  Chronic obstructive pulmonary disease (COPD). This is a long-term lung condition that makes it hard to breathe. It also gets worse over time.  High blood pressure (hypertension), heart disease, stroke, or heart attack.  Lung infections, such as pneumonia.  Cataracts. This is when the lenses in the eyes become clouded.  Digestive problems. This may include peptic ulcers, heartburn, and gastroesophageal reflux disease (GERD).  Oral health problems, such as gum disease and tooth loss.  Loss of taste and smell.  Smoking can affect your appearance by causing:  Wrinkles.  Yellow or stained teeth, fingers, and fingernails.  Smoking tobacco can also affect your social  life.  Many workplaces, restaurants, hotels, and public places are tobacco-free. This means that you may experience challenges in finding places to smoke when away from home.  The cost of a smoking habit can be expensive. Expenses for someone who smokes come in two ways: ? You spend money on a regular basis to buy tobacco. ? Your health care costs in the long-term are higher if you  smoke.  Tobacco smoke can also affect the health of those around you. Children of smokers have greater chances of: ? Sudden infant death syndrome (SIDS). ? Ear infections. ? Lung infections.  What lifestyle changes can be made?  Do not start smoking. Quit if you already do.  To quit smoking: ? Make a plan to quit smoking and commit yourself to it. Look for programs to help you and ask your health care provider for recommendations and ideas. ? Talk with your health care provider about using nicotine replacement medicines to help you quit. Medicine replacement medicines include gum, lozenges, patches, sprays, or pills. ? Do not replace cigarette smoking with electronic cigarettes, which are commonly called e-cigarettes. The safety of e-cigarettes is not known, and some may contain harmful chemicals. ? Avoid places, people, or situations that tempt you to smoke. ? If you try to quit but return to smoking, don't give up hope. It is very common for people to try a number of times before they fully succeed. When you feel ready again, give it another try.  Quitting smoking might affect the way you eat as well as your weight. Be prepared to monitor your eating habits. Get support in planning and following a healthy diet.  Ask your health care provider about having regular tests (screenings) to check for cancer. This may include blood tests, imaging tests, and other tests.  Exercise regularly. Consider taking walks, joining a gym, or doing yoga or exercise classes.  Develop skills to manage your stress. These skills include meditation. What are the benefits of quitting smoking? By quitting smoking, you may:  Lower your risk of getting cancer and other diseases caused by smoking.  Live longer.  Breathe better.  Lower your blood pressure and heart rate.  Stop your addiction to tobacco.  Stop creating secondhand smoke that hurts other people.  Improve your sense of taste and  smell.  Look better over time, due to having fewer wrinkles and less staining.  What can happen if changes are not made? If you do not stop smoking, you may:  Get cancer and other diseases.  Develop COPD or other long-term (chronic) lung conditions.  Develop serious problems with your heart and blood vessels (cardiovascular system).  Need more tests to screen for problems caused by smoking.  Have higher, long-term healthcare costs from medicines or treatments related to smoking.  Continue to have worsening changes in your lungs, mouth, and nose.  Where to find support: To get support to quit smoking, consider:  Asking your health care provider for more information and resources.  Taking classes to learn more about quitting smoking.  Looking for local organizations that offer resources about quitting smoking.  Joining a support group for people who want to quit smoking in your local community.  Where to find more information: You may find more information about quitting smoking from:  HelpGuide.org: www.helpguide.org/articles/addictions/how-to-quit-smoking.htm  https://Vara.com/: smokefree.gov  American Lung Association: www.lung.org  Contact a health care provider if:  You have problems breathing.  Your lips, nose, or fingers turn blue.  You have chest  pain.  You are coughing up blood.  You feel faint or you pass out.  You have other noticeable changes that cause you to worry. Summary  Smoking tobacco can negatively affect your health, the health of those around you, your finances, and your social life.  Do not start smoking. Quit if you already do. If you need help quitting, ask your health care provider.  Think about joining a support group for people who want to quit smoking in your local community. There are many effective programs that will help you to quit this behavior. This information is not intended to replace advice given to you by your health care  provider. Make sure you discuss any questions you have with your health care provider. Document Released: 11/09/2016 Document Revised: 11/09/2016 Document Reviewed: 11/09/2016 Elsevier Interactive Patient Education  [employer].

## 2017-04-22 NOTE — Progress Notes (Signed)
Patient ID: [name] [name], male   DOB: Mar 04, 1961, 56 y.o.   MRN: [id number]    Location:  [facility] Place of Service: OFFICE  Chief Complaint  Patient presents with  . Medical Management of Chronic Issues    3 month follow-up, fasting if any labs due. DM foot exam due. Here with sister [name] [name])   . Health Maintenance    Refused DM eye exam referral, discuss recommended Hep C screening   . Immunizations    Refused TdaP Vaccine   . Medication Refill    Renew Fluxotine #30 with refills   . Medication Management    Discuss Clonidine and Fenofibrate. Patient is not taking, yet medications were on medication list in Feb 2018 and it was indicated to continue all medications.     HPI:  56 yo male seen today for f/u. He has no concerns today. He stopped taking prozac as he did not feel he needed to continue it. He is a poor historian due to dementia. Hx obtained from chart  Hypertension - BP stable. Home readings 150/80. He is on 3 drug tx (norvasc, lisinopril, and hydralazine). He no longer needs prn clonidine  Dyslipidemia- stable on lipitor. TG 129; total chol 142; LDL 66. He no longer takes fenofibrate.  DM - diet controlled. Last a1c is 5.7%. Neuropathy resolved  Depression - he stopped  prozac on his own. He reports no further depression  Hx CVA with dysphagia and left hemiparesis -  no signs of aspiration present. He does not require nectar thick liquids anymore. Peg tube removed prior to d/c from SNF. Takes ASA/plavix. No neuropathy but he does have pain in LUE that is controlled with Aleve and Tylenol. Rarely takes norco. Uses cane to ambulate  H pylori infection - completed abx. Takes protonix BID. He c/o constipation. He is no longer taking senna.  Vascular dementia - stable on aricept 59m qHS    Past Medical History:  Diagnosis Date  . Acute respiratory failure ([facility]   . Cerebral edema (HCC)   . Diabetes mellitus without complication (HCC)    diet controlled- no meds  .  Dysphagia as late effect of cerebrovascular disease   . Fall at home   . Hemiparesis affecting left side as late effect of stroke ([place]   . Hyperlipidemia   . Hypertension   . Stroke ([facility]  . Tuberculosis    11 yrs ago per pt.- treated with meds per pt.    Past Surgical History:  Procedure Laterality Date  . ESOPHAGOGASTRODUODENOSCOPY N/A 08/12/2015   Procedure: ESOPHAGOGASTRODUODENOSCOPY (EGD);  Surgeon: MLadene Artist MD;  [facility]  Service: Endoscopy;  Laterality: N/A;  . feeding tube removed    . GASTROSTOMY TUBE PLACEMENT  05-19-15  . mechanical thrombectomy angioplasty stenet placement    . UPPER GASTROINTESTINAL ENDOSCOPY      Patient Care Team: CGildardo Cranker DO as PCP - General (Internal Medicine) GNyoka CowdenDPhylis Bougie NP as Nurse Practitioner ([facility], [place]  Social History   Social History  . Marital status: Single    Spouse name: N/A  . Number of children: N/A  . Years of education: N/A   Occupational History  . Not on file.   Social History Main Topics  . Smoking status: Current Every Day Smoker    Packs/day: 1.00    Types: Cigarettes  . Smokeless tobacco: Never Used  . Alcohol use No  . Drug use: No  .  Sexual activity: Yes   Other Topics Concern  . Not on file   Social History Narrative   Diet?  No salt      Do you drink/eat things with caffeine? none      Marital status?                                    What year were you married?      Do you live in a house, apartment, assisted living, condo, trailer, etc.? Apartment      Is it one or more stories? One, flat      How many persons live in your home?      Do you have any pets in your home? (please list)  no      Current or past profession:      Do you exercise?      no                                Type & how often?      Do you have a living will? no      Do you have a DNR form?    no                               If not, do you want to discuss one?      Do you have signed POA/HPOA for forms?            reports that he has been smoking Cigarettes.  He has been smoking about 1.00 pack per day. He has never used smokeless tobacco. He reports that he does not drink alcohol or use drugs.  Family History  Problem Relation Age of Onset  . Alcohol abuse Sister   . Alcohol abuse Brother   . Alcohol abuse Father   . Colon cancer Neg Hx   . Colon polyps Neg Hx   . Rectal cancer Neg Hx   . Stomach cancer Neg Hx   . Esophageal cancer Neg Hx    Family Status  Relation Status  . Mother Deceased  . Brother Alive  . Brother Alive  . Sister Alive  . Sister Alive  . Brother Deceased  . Son Alive  . Daughter Alive  . Daughter Alive  . Father Deceased  . Neg Hx (Not Specified)     No Known Allergies  Medications: Patient's Medications  New Prescriptions   No medications on file  Previous Medications   AMLODIPINE (NORVASC) 10 MG TABLET    TAKE 1 TABLET BY MOUTH EVERY DAY   ASPIRIN 325 MG TABLET    Take 1 tablet (325 mg total) by mouth daily.   ATORVASTATIN (LIPITOR) 80 MG TABLET    TAKE 1 TABLET BY MOUTH EVERY DAY   CLONIDINE (CATAPRES) 0.1 MG TABLET    TAKE 1 TABLET BY MOUTH EVERY 8 HOURS AS NEEDED FOR HYPERTENSION   DONEPEZIL (ARICEPT) 10 MG TABLET    TAKE 1 TABLET BY MOUTH EVERY DAY AT BEDTIME   FENOFIBRATE MICRONIZED (ANTARA) 43 MG CAPSULE    Take 1 capsule (43 mg total) by mouth 2 (two) times daily.   FLUOXETINE (PROZAC) 10 MG CAPSULE    TAKE ONE CAPSULE BY MOUTH EVERY DAY   HYDRALAZINE (APRESOLINE) 10 MG  TABLET    Take 10 mg by mouth every 8 (eight) hours.   HYDROCODONE-ACETAMINOPHEN (NORCO) 5-325 MG TABLET    Take 1 tablet by mouth every 6 (six) hours as needed for moderate pain.   LISINOPRIL (PRINIVIL,ZESTRIL) 20 MG TABLET    TAKE 1 TABLET BY MOUTH EVERY DAY   PANTOPRAZOLE (PROTONIX) 40 MG TABLET    TAKE 1 TABLET BY MOUTH TWICE A DAY  Modified Medications   No medications on  file  Discontinued Medications   No medications on file    Review of Systems  Vitals:   04/22/17 1026  BP: 138/90  Pulse: 61  Temp: 98.3 F (36.8 C)  TempSrc: Oral  SpO2: 97%  Weight: 279 lb (126.6 kg)  Height: _0  (1.88 m)   Body mass index is 35.82 kg/m.  Physical Exam  Constitutional: He appears well-developed and well-nourished.  Looks well in NAD; uses cane to ambulate  HENT:  Mouth/Throat: Oropharynx is clear and moist.  Eyes: Pupils are equal, round, and reactive to light. No scleral icterus.  Neck: Neck supple. Carotid bruit is not present. No thyromegaly present.  Cardiovascular: Normal rate, regular rhythm, normal heart sounds and intact distal pulses.  Exam reveals no gallop and no friction rub.   No murmur heard. no distal LE swelling. No calf TTP  Pulmonary/Chest: Effort normal and breath sounds normal. He has no wheezes. He has no rales. He exhibits no tenderness.  Abdominal: Soft. Bowel sounds are normal. He exhibits distension. He exhibits no abdominal bruit, no pulsatile midline mass and no mass. There is no tenderness. There is no rebound and no guarding.  No suprapubic TTP  Musculoskeletal: He exhibits edema and tenderness.  LUE/LLE swelling. No contracture  Lymphadenopathy:    He has no cervical adenopathy.  Neurological: He is alert.  LUE strength 3/5; LLE strength 4/5; gait unsteady  Skin: Skin is warm and dry. No rash noted.  Psychiatric: He has a normal mood and affect. His behavior is normal. Judgment and thought content normal.     Labs reviewed: No visits with results within 3 Month(s) from this visit.  Latest known visit with results is:  Office Visit on 12/24/2016  Component Date Value Ref Range Status  . Hgb A1c MFr Bld 12/24/2016 5.7* <5.7 % Final   Comment:   For someone without known diabetes, a hemoglobin A1c value between 5.7% and 6.4% is consistent with prediabetes and should be confirmed with a follow-up test.   For  someone with known diabetes, a value <7% indicates that their diabetes is well controlled. A1c targets should be individualized based on duration of diabetes, age, co-morbid conditions and other considerations.   This assay result is consistent with an increased risk of diabetes.   Currently, no consensus exists regarding use of hemoglobin A1c for diagnosis of diabetes in children.     . Mean Plasma Glucose 12/24/2016 117  mg/dL Final  . Cholesterol 12/24/2016 142  <200 mg/dL Final  . Triglycerides 12/24/2016 129  <150 mg/dL Final  . HDL 12/24/2016 50  >40 mg/dL Final  . Total CHOL/HDL Ratio 12/24/2016 2.8  <5.0 Ratio Final  . VLDL 12/24/2016 26  <30 mg/dL Final  . LDL Cholesterol 12/24/2016 66  <100 mg/dL Final  . Sodium 12/24/2016 141  135 - 146 mmol/L Final  . Potassium 12/24/2016 3.5  3.5 - 5.3 mmol/L Final  . Chloride 12/24/2016 103  98 - 110 mmol/L Final  . CO2 12/24/2016 24  20 -  31 mmol/L Final  . Glucose, Bld 12/24/2016 93  65 - 99 mg/dL Final  . BUN 12/24/2016 17  7 - 25 mg/dL Final  . Creat 12/24/2016 1.15  0.70 - 1.33 mg/dL Final   Comment:   For patients > or = 58 years of age: The upper reference limit for Creatinine is approximately 13% higher for people identified as African-American.     . Calcium 12/24/2016 9.4  8.6 - 10.3 mg/dL Final  . GFR, Est African American 12/24/2016 81  >=60 mL/min Final  . GFR, Est Non African American 12/24/2016 70  >=60 mL/min Final  . ALT 12/24/2016 33  9 - 46 U/L Final    No results found.   Assessment/Plan   ICD-10-CM   1. Tobacco abuse Z72.0   2. Chronic constipation K59.09   3. Essential hypertension I10 CMP with eGFR  4. H/O: stroke with residual effects I69.30   5. Spastic hemiplegia affecting nondominant side (HCC) G81.10   6. Dyslipidemia associated with type 2 diabetes mellitus (HCC) E11.69 Lipid Panel   E78.5   7. Type II diabetes mellitus with neurological manifestations (HCC) E11.49 CMP with eGFR     Hemoglobin A1c  8. Encounter for hepatitis C virus screening test for high risk patient Z11.59 Hep C Antibody   Z91.89    Smoking cessation discussed and highly urged  Will call with lab results  Follow up with specialists as scheduled  START Linzess 72 mcg daily for constipation  Follow up in 3 mos for HTN, hx CVA, DM  Handouts on smoking and constipation given  Alaila Pillard S. Perlie Gold  [facility] [address] [place], [place] [address] [phone] Cell (Monday-Friday 8 AM - 5 PM) [phone] After 5 PM and follow prompts

## 2017-04-23 LAB — HEMOGLOBIN A1C
HEMOGLOBIN A1C: 6 % — AB (ref <5.7)
Mean Plasma Glucose: 126 mg/dL

## 2017-04-23 LAB — HEPATITIS C ANTIBODY: HCV Ab: NEGATIVE

## 2017-05-01 ENCOUNTER — Other Ambulatory Visit: Payer: Self-pay | Admitting: Internal Medicine

## 2017-07-29 ENCOUNTER — Encounter: Payer: Self-pay | Admitting: Internal Medicine

## 2017-07-29 ENCOUNTER — Ambulatory Visit (INDEPENDENT_AMBULATORY_CARE_PROVIDER_SITE_OTHER): Payer: Medicare HMO | Admitting: Internal Medicine

## 2017-07-29 VITALS — BP 140/90 | Temp 98.6°F | Resp 20 | Ht 74.0 in | Wt 276.2 lb

## 2017-07-29 DIAGNOSIS — E1149 Type 2 diabetes mellitus with other diabetic neurological complication: Secondary | ICD-10-CM

## 2017-07-29 DIAGNOSIS — Z720 Tobacco use: Secondary | ICD-10-CM

## 2017-07-29 DIAGNOSIS — Z23 Encounter for immunization: Secondary | ICD-10-CM

## 2017-07-29 DIAGNOSIS — I6930 Unspecified sequelae of cerebral infarction: Secondary | ICD-10-CM

## 2017-07-29 DIAGNOSIS — E785 Hyperlipidemia, unspecified: Secondary | ICD-10-CM

## 2017-07-29 DIAGNOSIS — I10 Essential (primary) hypertension: Secondary | ICD-10-CM | POA: Diagnosis not present

## 2017-07-29 DIAGNOSIS — E1169 Type 2 diabetes mellitus with other specified complication: Secondary | ICD-10-CM | POA: Diagnosis not present

## 2017-07-29 DIAGNOSIS — K5909 Other constipation: Secondary | ICD-10-CM

## 2017-07-29 DIAGNOSIS — G8110 Spastic hemiplegia affecting unspecified side: Secondary | ICD-10-CM

## 2017-07-29 MED ORDER — HYDROCODONE-ACETAMINOPHEN 5-325 MG PO TABS: 1.0000 | ORAL_TABLET | Freq: Four times a day (QID) | ORAL | 0 refills | Status: DC | PRN

## 2017-07-29 MED ORDER — FLUOXETINE HCL 10 MG PO CAPS: 10.0000 mg | ORAL_CAPSULE | Freq: Every day | ORAL | 1 refills | Status: DC

## 2017-07-29 NOTE — Progress Notes (Signed)
Patient ID: [name] [name], male   DOB: [birth year], 56 y.o.   MRN: [id number]    Location:  [facility] Place of Service: OFFICE  Chief Complaint  Patient presents with  . Medical Management of Chronic Issues    3 mo for HTN, DM, Hx of CVA    HPI:  56 yo male seen today for f/u. He started smoking again - 1 ppd. He walks every day. Maintains healthy balanced diet. No falls.   Hypertension - BP stable. Home readings 150/80. He is on 3 drug tx (norvasc, lisinopril, and hydralazine). He no longer needs prn clonidine  Dyslipidemia- stable on lipitor. TG 107; total chol 135; LDL 69. He no longer takes fenofibrate.  DM - diet controlled. Last a1c is 6%. 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 He does not require nectar thick liquids anymore. Peg tube removed prior to d/c from SNF. Takes ASA 384m daily. He has neuropathy in LUE and has flexion contractures at elbow, wrist, hand and fingers. Pain controlled with norco daily, Aleve and Tylenol. Uses cane to ambulate. He moved into new aprtment and is looking for shower bench.  H pylori infection - completed abx. Takes protonix BID. He c/o constipation. He is no longer taking senna. He drinks prune juice but causes loose stools. Linzess 766m samples worked well.  Vascular dementia - stable. He stopped taking aricept for unknown reason.   Past Medical History:  Diagnosis Date  . Acute respiratory failure ([facility]  . Cerebral edema (HCC)   . Diabetes mellitus without complication (HCC)    diet controlled- no meds  . Dysphagia as late effect of cerebrovascular disease   . Fall at home   . Hemiparesis affecting left side as late effect of stroke ([facility]  . Hyperlipidemia   . Hypertension   . St[place] ([facility]  . Tuberculosis    11 yrs ago per pt.- treated with meds per pt.    Past Surgical History:  Procedure Laterality Date  .  ESOPHAGOGASTRODUODENOSCOPY N/A 08/12/2015   Procedure: ESOPHAGOGASTRODUODENOSCOPY (EGD);  Surgeon: MaLadene ArtistMD;  [facility];  Service: Endoscopy;  Laterality: N/A;  . feeding tube removed    . GASTROSTOMY TUBE PLACEMENT  05-19-15  . mechanical thrombectomy angioplasty stenet placement    . UPPER GASTROINTESTINAL ENDOSCOPY      Patient Care Team: CaGildardo CrankerDO as PCP - General (Internal Medicine) GrNyoka CowdenePhylis BougieNP as Nurse Practitioner ([facility] [place] Social History   Social History  . Marital status: Single    Spouse name: N/A  . Number of children: N/A  . Years of education: N/A   Occupational History  . Not on file.   Social History Main Topics  . Smoking status: Current Every Day Smoker    Packs/day: 1.00    Types: Cigarettes  . Smokeless tobacco: Never Used  . Alcohol use No  . Drug use: No  . Sexual activity: Yes   Other Topics Concern  . Not on file   Social History Narrative   Diet?  No salt      Do you drink/eat things with caffeine? none      Marital status?  What year were you married?      Do you live in a house, apartment, assisted living, condo, trailer, etc.? Apartment      Is it one or more stories? One, flat      How many persons live in your home?      Do you have any pets in your home? (please list)  no      Current or past profession:      Do you exercise?      no                                Type & how often?      Do you have a living will? no      Do you have a DNR form?    no                              If not, do you want to discuss one?      Do you have signed POA/HPOA for forms?            reports that he has been smoking Cigarettes.  He has been smoking about 1.00 pack per day. He has never used smokeless tobacco. He reports that he does not drink alcohol or use drugs.  Family History  Problem Relation  Age of Onset  . Alcohol abuse Sister   . Alcohol abuse Brother   . Alcohol abuse Father   . Colon cancer Neg Hx   . Colon polyps Neg Hx   . Rectal cancer Neg Hx   . Stomach cancer Neg Hx   . Esophageal cancer Neg Hx    Family Status  Relation Status  . Mother Deceased  . Brother Alive  . Brother Alive  . Sister Alive  . Sister Alive  . Brother Deceased  . Son Alive  . Daughter Alive  . Daughter Alive  . Father Deceased  . Neg Hx (Not Specified)     No Known Allergies  Medications: Patient's Medications  New Prescriptions   No medications on file  Previous Medications   AMLODIPINE (NORVASC) 10 MG TABLET    TAKE 1 TABLET BY MOUTH EVERY DAY   ASPIRIN 325 MG TABLET    Take 1 tablet (325 mg total) by mouth daily.   ATORVASTATIN (LIPITOR) 80 MG TABLET    TAKE 1 TABLET BY MOUTH EVERY DAY   CLONIDINE (CATAPRES) 0.1 MG TABLET    TAKE 1 TABLET BY MOUTH EVERY 8 HOURS AS NEEDED FOR HYPERTENSION   DONEPEZIL (ARICEPT) 10 MG TABLET    TAKE 1 TABLET BY MOUTH EVERY DAY AT BEDTIME   FENOFIBRATE MICRONIZED (ANTARA) 43 MG CAPSULE    Take 1 capsule (43 mg total) by mouth 2 (two) times daily.   FLUOXETINE (PROZAC) 10 MG CAPSULE    TAKE ONE CAPSULE BY MOUTH EVERY DAY   HYDRALAZINE (APRESOLINE) 10 MG TABLET    Take 10 mg by mouth every 8 (eight) hours.   HYDROCODONE-ACETAMINOPHEN (NORCO) 5-325 MG TABLET    Take 1 tablet by mouth every 6 (six) hours as needed for moderate pain.   LISINOPRIL (PRINIVIL,ZESTRIL) 20 MG TABLET    TAKE 1 TABLET BY MOUTH EVERY DAY   PANTOPRAZOLE (PROTONIX) 40 MG TABLET    TAKE 1 TABLET BY MOUTH TWICE A DAY  Modified Medications   No  medications on file  Discontinued Medications   No medications on file    Review of Systems  Unable to perform ROS: Dementia (memory loss)    Vitals:   07/29/17 0810  BP: 140/90  Resp: 20  Temp: 98.6 F (37 C)  TempSrc: Oral  SpO2: 95%  Weight: 276 lb 3.2 oz (125.3 kg)  Height: 6\' 2"  (1.88 m)   Body mass index is 35.46  kg/m.  Physical Exam  Constitutional: He is oriented to person, place, and time. He appears well-developed and well-nourished.  HENT:  Mouth/Throat: Oropharynx is clear and moist.  MMM; no oral thrush  Eyes: Pupils are equal, round, and reactive to light. No scleral icterus.  Neck: Neck supple. Carotid bruit is not present.  Cardiovascular: Normal rate, regular rhythm and intact distal pulses.  Exam reveals no gallop and no friction rub.   Murmur (1/6 SEM) heard. LUE>LLE swelling; no RLE edema; no calf TTP  Pulmonary/Chest: Effort normal and breath sounds normal. He has no wheezes. He has no rales. He exhibits no tenderness.  Abdominal: Soft. Normal appearance and bowel sounds are normal. He exhibits no distension, no abdominal bruit, no pulsatile midline mass and no mass. There is no hepatomegaly. There is no tenderness. There is no rigidity, no rebound and no guarding. No hernia.  Musculoskeletal: He exhibits edema and deformity (LUE flexion contracture at elbow, wrist, hand and fingers. left 5th finger severe flexion contracture ).  Lymphadenopathy:    He has no cervical adenopathy.  Neurological: He is alert and oriented to person, place, and time.  Skin: Skin is warm and dry. No rash noted.  Psychiatric: He has a normal mood and affect. His behavior is normal.     Labs reviewed: No visits with results within 3 Month(s) from this visit.  Latest known visit with results is:  Office Visit on 04/22/2017  Component Date Value Ref Range Status  . Sodium 04/22/2017 141  135 - 146 mmol/L Final  . Potassium 04/22/2017 3.9  3.5 - 5.3 mmol/L Final  . Chloride 04/22/2017 105  98 - 110 mmol/L Final  . CO2 04/22/2017 26  20 - 31 mmol/L Final  . Glucose, Bld 04/22/2017 87  65 - 99 mg/dL Final  . BUN 04/24/2017 14  7 - 25 mg/dL Final  . Creat 23/53/6144 1.21  0.70 - 1.33 mg/dL Final   Comment:   For patients > or = 58 years of age: The upper reference limit for Creatinine is approximately  13% higher for people identified as African-American.     . Total Bilirubin 04/22/2017 0.5  0.2 - 1.2 mg/dL Final  . Alkaline Phosphatase 04/22/2017 110  40 - 115 U/L Final  . AST 04/22/2017 17  10 - 35 U/L Final  . ALT 04/22/2017 20  9 - 46 U/L Final  . Total Protein 04/22/2017 7.4  6.1 - 8.1 g/dL Final  . Albumin 04/24/2017 4.5  3.6 - 5.1 g/dL Final  . Calcium 76/19/5093 9.5  8.6 - 10.3 mg/dL Final  . GFR, Est African American 04/22/2017 76  >=60 mL/min Final  . GFR, Est Non African American 04/22/2017 66  >=60 mL/min Final  . Hgb A1c MFr Bld 04/22/2017 6.0* <5.7 % Final   Comment:   For someone without known diabetes, a hemoglobin A1c value between 5.7% and 6.4% is consistent with prediabetes and should be confirmed with a follow-up test.   For someone with known diabetes, a value <7% indicates that their diabetes is  well controlled. A1c targets should be individualized based on duration of diabetes, age, co-morbid conditions and other considerations.   This assay result is consistent with an increased risk of diabetes.   Currently, no consensus exists regarding use of hemoglobin A1c for diagnosis of diabetes in children.     . Mean Plasma Glucose 04/22/2017 126  mg/dL Final  . Cholesterol 04/22/2017 135  <200 mg/dL Final  . Triglycerides 04/22/2017 107  <150 mg/dL Final  . HDL 04/22/2017 45  >40 mg/dL Final  . Total CHOL/HDL Ratio 04/22/2017 3.0  <5.0 Ratio Final  . VLDL 04/22/2017 21  <30 mg/dL Final  . LDL Cholesterol 04/22/2017 69  <100 mg/dL Final  . HCV Ab 04/22/2017 NEGATIVE  NEGATIVE Final    No results found.   Assessment/Plan   ICD-10-CM   1. Spastic hemiplegia affecting nondominant side (HCC) G81.10 HYDROcodone-acetaminophen (NORCO) 5-325 MG tablet  2. H/O: stroke with residual effects I69.30   3. Tobacco abuse Z72.0   4. Type II diabetes mellitus with neurological manifestations (HCC) E11.49 BMP with eGFR    ALT    Hemoglobin A1c     Microalbumin/Creatinine Ratio, Urine    Microalbumin/Creatinine Ratio, Urine  5. Dyslipidemia associated with type 2 diabetes mellitus (HCC) E11.69 Lipid Panel   E78.5   6. Essential hypertension I10   7. Chronic constipation K59.09   8. Need for immunization against influenza Z23 Flu Vaccine QUAD 36+ mos IM   Continue current medications as ordered  May get shower bench at medical supply store  Smoking cessation discussed and highly urged  Influenza vaccine given today  Will call with lab results  Follow up in 3 mos for CPE   [facility] S. Perlie Gold  [facility] [address] [place], [place] [address] [phone] Cell (Monday-Friday 8 AM - 5 PM) [phone] After 5 PM and follow prompts

## 2017-07-29 NOTE — Patient Instructions (Signed)
Continue current medications as ordered  May get shower bench at medical supply store  Smoking cessation discussed and highly urged  Influenza vaccine given today  Will call with lab results  Follow up in 3 mos for CPE

## 2017-07-30 LAB — BASIC METABOLIC PANEL WITH GFR
BUN: 14 mg/dL (ref 7–25)
CALCIUM: 9.5 mg/dL (ref 8.6–10.3)
CHLORIDE: 104 mmol/L (ref 98–110)
CO2: 25 mmol/L (ref 20–32)
Creat: 1.31 mg/dL (ref 0.70–1.33)
GFR, EST AFRICAN AMERICAN: 69 mL/min/{1.73_m2} (ref >=60)
GFR, Est Non African American: 60 mL/min/{1.73_m2} (ref >=60)
Glucose, Bld: 94 mg/dL (ref 65–99)
POTASSIUM: 3.9 mmol/L (ref 3.5–5.3)
Sodium: 139 mmol/L (ref 135–146)

## 2017-07-30 LAB — ALT: ALT: 22 U/L (ref 9–46)

## 2017-07-30 LAB — LIPID PANEL
CHOLESTEROL: 250 mg/dL — AB (ref <200)
HDL: 50 mg/dL (ref >40)
LDL Cholesterol (Calc): 168 mg/dL (calc) — ABNORMAL HIGH
NON-HDL CHOLESTEROL (CALC): 200 mg/dL — AB (ref <130)
TRIGLYCERIDES: 171 mg/dL — AB (ref <150)
Total CHOL/HDL Ratio: 5 (calc) — ABNORMAL HIGH (ref <5.0)

## 2017-07-30 LAB — HEMOGLOBIN A1C
EAG (MMOL/L): 6.8 (calc)
Hgb A1c MFr Bld: 5.9 % of total Hgb — ABNORMAL HIGH (ref <5.7)
Mean Plasma Glucose: 123 (calc)

## 2017-07-31 LAB — MICROALBUMIN / CREATININE URINE RATIO
CREATININE, URINE: 185 mg/dL (ref 20–320)
MICROALB UR: 3 mg/dL
MICROALB/CREAT RATIO: 16 ug/mg{creat} (ref <30)

## 2017-07-31 LAB — URINE CULTURE
MICRO NUMBER:: 81047528
SPECIMEN QUALITY:: ADEQUATE

## 2017-08-05 ENCOUNTER — Other Ambulatory Visit: Payer: Self-pay | Admitting: Internal Medicine

## 2017-08-29 ENCOUNTER — Other Ambulatory Visit: Payer: Self-pay | Admitting: Internal Medicine

## 2017-08-29 DIAGNOSIS — I10 Essential (primary) hypertension: Secondary | ICD-10-CM

## 2017-08-29 DIAGNOSIS — E1169 Type 2 diabetes mellitus with other specified complication: Secondary | ICD-10-CM

## 2017-08-29 DIAGNOSIS — E785 Hyperlipidemia, unspecified: Secondary | ICD-10-CM

## 2017-09-08 ENCOUNTER — Other Ambulatory Visit: Payer: Self-pay | Admitting: Internal Medicine

## 2017-09-08 DIAGNOSIS — I10 Essential (primary) hypertension: Secondary | ICD-10-CM

## 2017-09-08 DIAGNOSIS — E1169 Type 2 diabetes mellitus with other specified complication: Secondary | ICD-10-CM

## 2017-09-08 DIAGNOSIS — E785 Hyperlipidemia, unspecified: Principal | ICD-10-CM

## 2017-09-09 ENCOUNTER — Other Ambulatory Visit: Payer: Self-pay | Admitting: Internal Medicine

## 2017-09-09 DIAGNOSIS — E1169 Type 2 diabetes mellitus with other specified complication: Secondary | ICD-10-CM

## 2017-09-09 DIAGNOSIS — E785 Hyperlipidemia, unspecified: Principal | ICD-10-CM

## 2017-09-21 ENCOUNTER — Telehealth: Payer: Self-pay | Admitting: *Deleted

## 2017-09-21 NOTE — Telephone Encounter (Signed)
Received fax from [facility] [phone] stating [employer] has been approved with a $250.68 Medicare Part D Copay. The patient is ineligible for copay card assistance due to their insurance. [facility] have not been able to reach patient.  Faxed placed in folder for Dr. Eulas Post to review.

## 2017-10-05 ENCOUNTER — Telehealth: Payer: Self-pay

## 2017-10-05 NOTE — Telephone Encounter (Signed)
I spoke with patient about completing the necessary paperwork to see if patient can receive assistance through the [employer].   Patient stated that he would come by today to complete paperwork. Paperwork was placed at in yellow folder in my mailbox.

## 2017-10-08 ENCOUNTER — Other Ambulatory Visit: Payer: Self-pay | Admitting: Internal Medicine

## 2017-10-08 DIAGNOSIS — I10 Essential (primary) hypertension: Secondary | ICD-10-CM

## 2017-10-12 ENCOUNTER — Encounter: Payer: Self-pay | Admitting: Gastroenterology

## 2017-11-07 ENCOUNTER — Other Ambulatory Visit: Payer: Self-pay | Admitting: Internal Medicine

## 2017-11-11 ENCOUNTER — Encounter: Payer: Medicare HMO | Admitting: Internal Medicine

## 2018-01-11 ENCOUNTER — Ambulatory Visit (INDEPENDENT_AMBULATORY_CARE_PROVIDER_SITE_OTHER): Payer: Medicare HMO | Admitting: Internal Medicine

## 2018-01-11 ENCOUNTER — Encounter: Payer: Self-pay | Admitting: Internal Medicine

## 2018-01-11 ENCOUNTER — Ambulatory Visit (INDEPENDENT_AMBULATORY_CARE_PROVIDER_SITE_OTHER): Payer: Medicare HMO

## 2018-01-11 VITALS — BP 184/90 | HR 88 | Temp 97.5°F | Ht 74.0 in | Wt 277.0 lb

## 2018-01-11 DIAGNOSIS — Z0000 Encounter for general adult medical examination without abnormal findings: Secondary | ICD-10-CM

## 2018-01-11 DIAGNOSIS — I10 Essential (primary) hypertension: Secondary | ICD-10-CM | POA: Diagnosis not present

## 2018-01-11 DIAGNOSIS — E1149 Type 2 diabetes mellitus with other diabetic neurological complication: Secondary | ICD-10-CM

## 2018-01-11 DIAGNOSIS — Z1211 Encounter for screening for malignant neoplasm of colon: Secondary | ICD-10-CM

## 2018-01-11 DIAGNOSIS — E785 Hyperlipidemia, unspecified: Secondary | ICD-10-CM

## 2018-01-11 DIAGNOSIS — F0150 Vascular dementia without behavioral disturbance: Secondary | ICD-10-CM

## 2018-01-11 DIAGNOSIS — E1169 Type 2 diabetes mellitus with other specified complication: Secondary | ICD-10-CM | POA: Diagnosis not present

## 2018-01-11 DIAGNOSIS — Z720 Tobacco use: Secondary | ICD-10-CM | POA: Diagnosis not present

## 2018-01-11 DIAGNOSIS — Z7189 Other specified counseling: Secondary | ICD-10-CM | POA: Diagnosis not present

## 2018-01-11 DIAGNOSIS — D126 Benign neoplasm of colon, unspecified: Secondary | ICD-10-CM | POA: Insufficient documentation

## 2018-01-11 DIAGNOSIS — G8110 Spastic hemiplegia affecting unspecified side: Secondary | ICD-10-CM | POA: Insufficient documentation

## 2018-01-11 DIAGNOSIS — R195 Other fecal abnormalities: Secondary | ICD-10-CM

## 2018-01-11 MED ORDER — CLONIDINE HCL 0.1 MG PO TABS
0.1000 mg | ORAL_TABLET | Freq: Once | ORAL | Status: AC
  Administered 2018-01-11: 0.1 mg via ORAL

## 2018-01-11 NOTE — Progress Notes (Signed)
Patient ID: [name] [name], male   DOB: 04/06/1961, 57 y.o.   MRN: [id number]    Location:  [facility]  Place of Service:  OFFICE  Provider: Arletha Grippe, DO  Patient Care Team: Gildardo Cranker, DO as PCP - General (Internal Medicine) Gerlene Fee, NP as Nurse Practitioner ([place]) [facility], [facility] ([place])  Extended Emergency Contact Information Primary Emergency Contact: [name],[name] Address: [name]          [place], [place] [address] [name] Phone: [phone] Relation: Daughter Secondary Emergency Contact: [name],[name]  [place] Phone: [phone] Relation: Other  Code Status: FULL CODE Goals of Care: Advanced Directive information Advanced Directives 01/11/2018  Does Patient Have a Medical Advance Directive? Yes  Type of Paramedic of [place];Living will  Does patient want to make changes to medical advance directive? No - Patient declined  Copy of [place] in Chart? No - copy requested  Would patient like information on creating a medical advance directive? -     Chief Complaint  Patient presents with  . Annual Exam    Yearly check-up, AWV completed today, EKG.   . Medication Refill    No refills needed     HPI: Patient is a 57 y.o. male seen in today for comprehensive exam. He had AWV today. Note reviewed. He c/o insomnia (trouble falling asleep) and takes approx 5 tabs of OTC sleep aide (he does not know ingredients).  He c/o rectal bleeding. Last colonoscopy in 09/2016 revealed multiple polyps, internal hemorrhoids. He was told he needed to repeat in 1 yr due to polyps and poor prep but he never went. Polyp bx report (+) tubular adenomas and hyperplastic.  Chronic tobacco abuse - approx 1/2 ppd. No plans to quit.  He does not walk every day. Maintains healthy balanced diet. No falls.   Hypertension - BP elevated today as he has  not taken medications yet. He does not check BP at home. He is on 3 drug tx (norvasc, lisinopril, and hydralazine). He no longer needs prn clonidine. Cr 1.31  Dyslipidemia- stable on lipitor. TG 107; total chol 250; LDL 168. He no longer takes fenofibrate.  DM - diet controlled. A1c is 5.9%. 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 Urine microalbumin/Creatinine ratio 16.  Depression - mood stable on prozac  Hx CVA with dysphagia and left hemiparesis -  no signs of aspiration present. He does not require nectar thick liquids anymore. Peg tube removed prior to d/c from SNF. Takes ASA 339m daily. He has neuropathy in LUE and has flexion contractures at elbow, wrist, hand and fingers. Pain controlled with norco daily, Aleve and Tylenol. Uses cane to ambulate. He lives alone in an apt.  H pylori infection - completed abx. Takes protonix BID. He c/o constipation. He is no longer taking senna. He drinks prune juice but causes loose stools. Linzess 726m samples worked well.  Vascular dementia - unchanged. He never resumed aricept. MMSE 23/30 today   [facility] 01/11/2018 01/11/2018 12/10/2016 01/28/2016  Decreased Interest 0 0 0 0  Down, Depressed, Hopeless 0 0 0 0  PHQ - 2 Score 0 0 0 0    Fall Risk  01/11/2018 01/11/2018 04/22/2017 12/24/2016 12/10/2016  Falls in [name] past year? No No No No No  Risk for fall due to : - - - - Impaired mobility;Impaired balance/gait   MMSE - Mini Mental  State Exam 01/11/2018 12/10/2016  Not completed: - Unable to complete  Orientation to time 4 -  Orientation to Place 5 -  Registration 3 -  Attention/ Calculation 0 -  Recall 3 -  Language- name 2 objects 2 -  Language- repeat 1 -  Language- follow 3 step command 3 -  Language- read & follow direction 1 -  Write a sentence 1 -  Copy design 0 -  Total score 23 -      Health Maintenance  Topic Date Due  . OPHTHALMOLOGY EXAM  11/09/1968  . TETANUS/TDAP  11/09/1977  . COLONOSCOPY  10/06/2017  .  HEMOGLOBIN A1C  01/26/2018  . FOOT EXAM  07/29/2018  . PNEUMOCOCCAL POLYSACCHARIDE VACCINE (2) 12/10/2021  . INFLUENZA VACCINE  Completed  . Hepatitis C Screening  Completed  . HIV Screening  Completed    Past Medical History:  Diagnosis Date  . Acute respiratory failure ([place])   . Cerebral edema (HCC)   . Diabetes mellitus without complication (HCC)    diet controlled- no meds  . Dysphagia as late effect of cerebrovascular disease   . Fall at home   . Hemiparesis affecting left side as late effect of stroke ([place])   . Hyperlipidemia   . Hypertension   . Stroke ([place]) 2016  . Tuberculosis    11 yrs ago per pt.- treated with meds per pt.    Past Surgical History:  Procedure Laterality Date  . ESOPHAGOGASTRODUODENOSCOPY N/A 08/12/2015   Procedure: ESOPHAGOGASTRODUODENOSCOPY (EGD);  Surgeon: Ladene Artist, MD;  Location[facility] END[facility]COPY;  Service: Endoscopy;  Laterality: N/A;  . feeding tube removed    . GASTROSTOMY TUBE PLACEMENT  05-19-15  . mechanical thrombectomy angioplasty stenet placement    . UPPER GASTROINTESTINAL ENDOSCOPY      Family History  Problem Relation Age of Onset  . Alcohol abuse Sister   . Alcohol abuse Brother   . Alcohol abuse Father   . Colon cancer Neg Hx   . Colon polyps Neg Hx   . Rectal cancer Neg Hx   . Stomach cancer Neg Hx   . Esophageal cancer Neg Hx    Family Status  Relation Name Status  . Mother [name] [name]  . Brother [name] [name]  . Brother [name] [name]  . Sister [name] [name]  . Sister [name] [name]  . Brother [name] [name]  . Son [name] [name]  . Daughter [name] [name]  . Daughter [name] [name]  . Father  [name]  . Neg Hx  (Not Specified)    Social History   Socioeconomic History  . Marital status: Single    Spouse name: Not on file  . Number of children: Not on file  . Years of education: Not on file  . Highest education level: Not on file  Social Needs  . Financial resource strain: Not hard at all  . Food  insecurity - worry: Never true  . Food insecurity - inability: Never true  . Transportation needs - medical: No  . Transportation needs - non-medical: No  Occupational History  . Not on file  Tobacco Use  . Smoking status: Current Every Day Smoker    Packs/day: 1.00    Types: Cigarettes  . Smokeless tobacco: Never Used  Substance and Sexual Activity  . Alcohol use: No    Alcohol/week: 0.0 oz  . Drug use: No  . Sexual activity: Yes  Other Topics Concern  . Not on file  Social History Narrative  Diet?  No salt      Do you drink/eat things with caffeine? none      Marital status?                                    What year were you married?      Do you live in a house, apartment, assisted living, condo, trailer, etc.? Apartment      Is it one or more stories? One, flat      How many persons live in your home?      Do you have any pets in your home? (please list)  no      Current or past profession:      Do you exercise?      no                                Type & how often?      Do you have a living will? no      Do you have a DNR form?    no                              If not, do you want to discuss one?      Do you have signed POA/HPOA for forms?        No Known Allergies  Allergies as of 01/11/2018   No Known Allergies     Medication List        Accurate as of 01/11/18 10:38 AM. Always use your most recent med list.          amLODipine 10 MG tablet Commonly known as:  NORVASC TAKE 1 TABLET BY MOUTH EVERY DAY   aspirin 325 MG tablet Take 1 tablet (325 mg total) by mouth daily.   atorvastatin 80 MG tablet Commonly known as:  LIPITOR TAKE 1 TABLET BY MOUTH EVERY DAY   cloNIDine 0.1 MG tablet Commonly known as:  CATAPRES TAKE 1 TABLET BY MOUTH EVERY 8 HOURS AS NEEDED FOR HYPERTENSION   donepezil 10 MG tablet Commonly known as:  ARICEPT TAKE 1 TABLET BY MOUTH EVERY DAY AT BEDTIME   fenofibrate micronized 43 MG capsule Commonly known as:   ANTARA TAKE ONE CAPSULE BY MOUTH TWICE A DAY   FLUoxetine 10 MG capsule Commonly known as:  PROZAC Take 1 capsule (10 mg total) by mouth daily.   hydrALAZINE 10 MG tablet Commonly known as:  APRESOLINE TAKE 1 TABLET BY MOUTH EVERY 8 HOURS FOR ESSENTIAL HYPERTENSION   HYDROcodone-acetaminophen 5-325 MG tablet Commonly known as:  NORCO Take 1 tablet by mouth every 6 (six) hours as needed for moderate pain.   lisinopril 20 MG tablet Commonly known as:  PRINIVIL,ZESTRIL TAKE 1 TABLET BY MOUTH EVERY DAY   pantoprazole 40 MG tablet Commonly known as:  PROTONIX TAKE 1 TABLET BY MOUTH TWICE A DAY        Review of Systems:  Review of Systems  Unable to perform ROS: Other (memory loss)  Gastrointestinal: Positive for blood in stool.  Musculoskeletal: Positive for gait problem.  Neurological: Positive for weakness and numbness.  All other systems reviewed and are negative.   Physical Exam: Vitals:   01/11/18 0946  BP: (!) 184/90  Pulse: 88  Temp: (!) 97.5 F (  36.4 C)  TempSrc: Oral  SpO2: 95%  Weight: 277 lb (125.6 kg)  Height: 6' 2"  (1.88 m)   Body mass index is 35.56 kg/m. Physical Exam  Constitutional: He appears well-developed and well-nourished.  HENT:  Mouth/Throat: Oropharynx is clear and moist.  MMM; no oral thrush  Eyes: Pupils are equal, round, and reactive to light. No scleral icterus.  Neck: Neck supple. Carotid bruit is not present. No thyromegaly present.  Cardiovascular: Normal rate, regular rhythm and intact distal pulses. Exam reveals no gallop and no friction rub.  Murmur (1/6 SEM) heard. No distal LE edema. No calf TTP  Pulmonary/Chest: Effort normal and breath sounds normal. He has no wheezes. He has no rales. He exhibits no tenderness.  Abdominal: Soft. Normal appearance and bowel sounds are normal. He exhibits no distension, no abdominal bruit, no pulsatile midline mass and no mass. There is no hepatomegaly. There is no tenderness. There is no  rigidity, no rebound and no guarding. No hernia. Hernia confirmed negative in [name] right inguinal area and confirmed negative in [name] left inguinal area.  obese  Genitourinary: Prostate normal, testes normal and penis normal. Rectal exam shows internal hemorrhoid and guaiac positive stool. Rectal exam shows no external hemorrhoid, no fissure, no mass, no tenderness and anal tone normal. Prostate is not enlarged and not tender. Circumcised.  Musculoskeletal: He exhibits edema, tenderness and deformity (LUE, LLE contracture).  Lymphadenopathy:    He has no cervical adenopathy.  Neurological: He is alert. He has normal reflexes. He exhibits abnormal muscle tone. Gait (uses cane; unsteady) abnormal.  Grip strength 2/5 on left; left hemiparesis  Skin: Skin is [name] and dry. No rash noted.  Psychiatric: He has a normal mood and affect. His behavior is normal. Thought content normal.   Diabetic Foot Exam - Simple   Simple Foot Form Diabetic Foot exam was performed with [name] following findings:  Yes 01/11/2018 12:58 PM  Visual Inspection See comments:  Yes Sensation Testing Intact to touch and monofilament testing bilaterally:  Yes Pulse Check Posterior Tibialis and Dorsalis pulse intact bilaterally:  Yes Comments Contracted left foot/toes; toenail dystrophy b/l; no ulceration/calluses      Labs reviewed:  Basic Metabolic Panel: Recent Labs    04/22/17 1148 07/29/17 0912  NA 141 139  K 3.9 3.9  CL 105 104  CO2 26 25  GLUCOSE 87 94  BUN 14 14  CREATININE 1.21 1.31  CALCIUM 9.5 9.5   Liver Function Tests: Recent Labs    04/22/17 1148 07/29/17 0912  AST 17  --   ALT 20 22  ALKPHOS 110  --   BILITOT 0.5  --   PROT 7.4  --   ALBUMIN 4.5  --    No results for input(s): LIPASE, AMYLASE in [name] last 8760 hours. No results for input(s): AMMONIA in [name] last 8760 hours. CBC: No results for input(s): WBC, NEUTROABS, HGB, HCT, MCV, PLT in [name] last 8760 hours. Lipid Panel: Recent Labs      04/22/17 1148 07/29/17 0912  CHOL 135 250*  HDL 45 50  LDLCALC 69  --   TRIG 107 171*  CHOLHDL 3.0 5.0*   Lab Results  Component Value Date   HGBA1C 5.9 (H) 07/29/2017    Procedures: No results found. ECG OBTAINED AND REVIEWED BY MYSELF: NSR @ 60 bpm, nml axis, T wave inverted V4-V6, poor R wave progression. No acute ischemic changes. No change since 08/2015.   Assessment/Plan   ICD-10-CM   1.  Well adult exam Z00.00   2. Heme positive stool R19.5 Ambulatory referral to Gastroenterology  3. Essential hypertension I10 EKG 12-Lead    CMP with eGFR([facility])    CBC with Differential/Platelets    cloNIDine (CATAPRES) tablet 0.1 mg  4. Type II diabetes mellitus with neurological manifestations (HCC) E11.49 CMP with eGFR([facility])    Hemoglobin A1c  5. Tobacco abuse Z72.0   6. Dyslipidemia associated with type 2 diabetes mellitus (HCC) E11.69 Lipid Panel   E78.5 TSH  7. Advance directive discussed with patient Z71.89   8. Adenomatous polyp of colon, unspecified part of colon D12.6 Ambulatory referral to Gastroenterology   transverse and rectal; multiple  9. Spastic hemiplegia affecting nondominant side (HCC) G81.10   10. Vascular dementia without behavioral disturbance F01.50     Will call with referral to GI for colonoscopy  Will call with lab results  Smoking cessation discussed and highly urged   RESTART ARICEPT TO PREVENT FURTHER MEMORY LOSS!  Continue current meds as ordered  Clonidine 0.37m tab given today for elevated blood pressure  Advanced directives discussed - he has HCPOA. Prefers Full Code  Follow up in 3 mos for HTN, DM, depression, hyperlipidemia.     Tangelia Sanson S. CPerlie Gold [facility] [address] [address]([phone] (Monday-Friday 8 AM - 5 PM) ([phone] 5 PM and follow prompts

## 2018-01-11 NOTE — Progress Notes (Signed)
Subjective:   [name] [name] is a 57 y.o. male who presents for Medicare Annual/Subsequent preventive examination.  Last AWV-12/10/2016    Objective:    Vitals: BP (!) 184/90 (BP Location: Left Arm, Patient Position: Sitting)   Pulse 88   Temp (!) 97.5 F (36.4 C) (Oral)   Ht 6\' 2"  (1.88 m)   Wt 277 lb (125.6 kg)   SpO2 95%   BMI 35.56 kg/m   Body mass index is 35.56 kg/m.  Provider notified of BP-pt stated he didn't take bp meds this morning  Advanced Directives 01/11/2018 07/29/2017 04/22/2017 12/24/2016 12/10/2016 09/22/2016 09/16/2016  Does Patient Have a Medical Advance Directive? Yes No No Yes Yes No No  Type of Paramedic of [place];Living will - - Healthcare Power of [place] - -  Does patient want to make changes to medical advance directive? No - Patient declined - - - - - -  Copy of [place] in Chart? No - copy requested - - No - copy requested No - copy requested - -  Would patient like information on creating a medical advance directive? - Yes (ED - Information included in AVS) No - Patient declined - - No - patient declined information -    Tobacco Social History   Tobacco Use  Smoking Status Current Every Day Smoker  . Packs/day: 1.00  . Types: Cigarettes  Smokeless Tobacco Never Used     Ready to quit: Not Answered Counseling given: Not Answered   Clinical Intake:  Pre-visit preparation completed: No  Pain : No/denies pain     Nutritional Risks: None Diabetes: No  How often do you need to have someone help you when you read instructions, pamphlets, or other written materials from your doctor or pharmacy?: 1 - Never What is the last grade level you completed in school?: 12th grade  Interpreter Needed?: No  Information entered by :: [name], RN  Past Medical History:  Diagnosis Date  . Acute respiratory failure ([place])   . Cerebral edema (HCC)   . Diabetes mellitus  without complication (HCC)    diet controlled- no meds  . Dysphagia as late effect of cerebrovascular disease   . Fall at home   . Hemiparesis affecting left side as late effect of stroke ([place])   . Hyperlipidemia   . Hypertension   . Stroke ([place]) 2016  . Tuberculosis    11 yrs ago per pt.- treated with meds per pt.   Past Surgical History:  Procedure Laterality Date  . ESOPHAGOGASTRODUODENOSCOPY N/A 08/12/2015   Procedure: ESOPHAGOGASTRODUODENOSCOPY (EGD);  Surgeon: Ladene Artist, MD;  Location: [facility] ENDOSCOPY;  Service: Endoscopy;  Laterality: N/A;  . feeding tube removed    . GASTROSTOMY TUBE PLACEMENT  05-19-15  . mechanical thrombectomy angioplasty stenet placement    . UPPER GASTROINTESTINAL ENDOSCOPY     Family History  Problem Relation Age of Onset  . Alcohol abuse Sister   . Alcohol abuse Brother   . Alcohol abuse Father   . Colon cancer Neg Hx   . Colon polyps Neg Hx   . Rectal cancer Neg Hx   . Stomach cancer Neg Hx   . Esophageal cancer Neg Hx    Social History   Socioeconomic History  . Marital status: Single    Spouse name: None  . Number of children: None  . Years of education: None  . Highest education level: None  Social Needs  .  Financial resource strain: Not hard at all  . Food insecurity - worry: Never true  . Food insecurity - inability: Never true  . Transportation needs - medical: No  . Transportation needs - non-medical: No  Occupational History  . None  Tobacco Use  . Smoking status: Current Every Day Smoker    Packs/day: 1.00    Types: Cigarettes  . Smokeless tobacco: Never Used  Substance and Sexual Activity  . Alcohol use: No    Alcohol/week: 0.0 oz  . Drug use: No  . Sexual activity: Yes  Other Topics Concern  . None  Social History Narrative   Diet?  No salt      Do you drink/eat things with caffeine? none      Marital status?                                    What year were you married?      Do you live in a house,  apartment, assisted living, condo, trailer, etc.? Apartment      Is it one or more stories? One, flat      How many persons live in your home?      Do you have any pets in your home? (please list)  no      Current or past profession:      Do you exercise?      no                                Type & how often?      Do you have a living will? no      Do you have a DNR form?    no                              If not, do you want to discuss one?      Do you have signed POA/HPOA for forms?        Outpatient Encounter Medications as of 01/11/2018  Medication Sig  . amLODipine (NORVASC) 10 MG tablet TAKE 1 TABLET BY MOUTH EVERY DAY  . aspirin 325 MG tablet Take 1 tablet (325 mg total) by mouth daily.  Marland Kitchen atorvastatin (LIPITOR) 80 MG tablet TAKE 1 TABLET BY MOUTH EVERY DAY  . cloNIDine (CATAPRES) 0.1 MG tablet TAKE 1 TABLET BY MOUTH EVERY 8 HOURS AS NEEDED FOR HYPERTENSION  . donepezil (ARICEPT) 10 MG tablet TAKE 1 TABLET BY MOUTH EVERY DAY AT BEDTIME  . fenofibrate micronized (ANTARA) 43 MG capsule TAKE ONE CAPSULE BY MOUTH TWICE A DAY  . FLUoxetine (PROZAC) 10 MG capsule Take 1 capsule (10 mg total) by mouth daily.  . hydrALAZINE (APRESOLINE) 10 MG tablet TAKE 1 TABLET BY MOUTH EVERY 8 HOURS FOR ESSENTIAL HYPERTENSION  . HYDROcodone-acetaminophen (NORCO) 5-325 MG tablet Take 1 tablet by mouth every 6 (six) hours as needed for moderate pain.  Marland Kitchen lisinopril (PRINIVIL,ZESTRIL) 20 MG tablet TAKE 1 TABLET BY MOUTH EVERY DAY  . pantoprazole (PROTONIX) 40 MG tablet TAKE 1 TABLET BY MOUTH TWICE A DAY  . [DISCONTINUED] DiphenhydrAMINE HCl, Sleep, (SLEEPING PO) Take by mouth.   No facility-administered encounter medications on file as of 01/11/2018.     Activities of Daily Living In your present state of health, do you have  any difficulty performing the following activities: 01/11/2018  Hearing? N  Vision? N  Difficulty concentrating or making decisions? N  Walking or climbing stairs? N    Dressing or bathing? N  Doing errands, shopping? N  Preparing Food and eating ? N  Using the Toilet? N  In the past six months, have you accidently leaked urine? Y  Do you have problems with loss of bowel control? N  Managing your Medications? N  Managing your Finances? N  Housekeeping or managing your Housekeeping? N  Some recent data might be hidden    Patient Care Team: Gildardo Cranker, DO as PCP - General (Internal Medicine) Nyoka Cowden Phylis Bougie, NP as Nurse Practitioner ([place]) [facility], [place] ([place])   Assessment:   This is a routine wellness examination for Gwyndolyn Saxon.  Exercise Activities and Dietary recommendations Current Exercise Habits: The patient does not participate in regular exercise at present, Exercise limited by: None identified  Goals    . Exercise 3x per week (30 min per time)     Paitient would like to start silver sneakers        Fall Risk Fall Risk  01/11/2018 04/22/2017 12/24/2016 12/10/2016 09/22/2016  Falls in the past year? No No No No No  Risk for fall due to : - - - Impaired mobility;Impaired balance/gait -   Is the patient's home free of loose throw rugs in walkways, pet beds, electrical cords, etc?   yes      Grab bars in the bathroom? no      Handrails on the stairs?   yes      Adequate lighting?   yes  Depression Screen PHQ 2/9 Scores 01/11/2018 12/10/2016 01/28/2016  PHQ - 2 Score 0 0 0    Cognitive Function within normal limits MMSE - Mini Mental State Exam 12/10/2016  Not completed: Unable to complete        Immunization History  Administered Date(s) Administered  . Influenza,inj,Quad PF,6+ Mos 08/10/2015, 09/22/2016, 07/29/2017  . Pneumococcal Polysaccharide-23 12/10/2016    Qualifies for Shingles Vaccine? Yes, educated and declined for now  Screening Tests Health Maintenance  Topic Date Due  . OPHTHALMOLOGY EXAM  11/09/1968  . TETANUS/TDAP  11/09/1977  . COLONOSCOPY  10/06/2017  .  HEMOGLOBIN A1C  01/26/2018  . FOOT EXAM  07/29/2018  . PNEUMOCOCCAL POLYSACCHARIDE VACCINE (2) 12/10/2021  . INFLUENZA VACCINE  Completed  . Hepatitis C Screening  Completed  . HIV Screening  Completed   Cancer Screenings: Lung: Low Dose CT Chest recommended if Age 50-80 years, 30 pack-year currently smoking OR have quit w/in 15years. Patient does qualify. Colorectal: due, ordered  Additional Screenings:  Hepatitis C Screening: declined    Plan:    I have personally reviewed and addressed the Medicare Annual Wellness questionnaire and have noted the following in the patient's chart:  A. Medical and social history B. Use of alcohol, tobacco or illicit drugs  C. Current medications and supplements D. Functional ability and status E.  Nutritional status F.  Physical activity G. Advance directives H. List of other physicians I.  Hospitalizations, surgeries, and ER visits in previous 12 months J.  [place] to include hearing, vision, cognitive, depression L. Referrals and appointments - none  In addition, I have reviewed and discussed with patient certain preventive protocols, quality metrics, and best practice recommendations. A written personalized care plan for preventive services as well as general preventive health recommendations were provided to patient.  See  attached scanned questionnaire for additional information.   Signed,   [name], RN Nurse Health Advisor  Patient Concerns: red blood in bowels for last 2 weeks, no pain. Trouble sleeping- currently taking 5 pills of Sleep Aid a night

## 2018-01-11 NOTE — Patient Instructions (Signed)
[name] [name] , Thank you for taking time to come for your Medicare Wellness Visit. I appreciate your ongoing commitment to your health goals. Please review the following plan we discussed and let me know if I can assist you in the future.   Screening recommendations/referrals: Colonoscopy due, ordered Recommended yearly ophthalmology/optometry visit for glaucoma screening and checkup Recommended yearly dental visit for hygiene and checkup  Vaccinations: Influenza vaccine up to date, due 2019 fall season Pneumococcal vaccine up to date, due at age 55 Tdap vaccine due, declined for now Shingles vaccine due, declined for now   Advanced directives: Advance directive discussed with you today. I have provided a copy for you to complete at home and have notarized. Once this is complete please bring a copy in to our office so we can scan it into your chart.  Conditions/risks identified: none  Next appointment: [name] Dense, RN  Preventive Care 40-64 Years, Male Preventive care refers to lifestyle choices and visits with your health care provider that can promote health and wellness. What does preventive care include?  A yearly physical exam. This is also called an annual well check.  Dental exams once or twice a year.  Routine eye exams. Ask your health care provider how often you should have your eyes checked.  Personal lifestyle choices, including:  Daily care of your teeth and gums.  Regular physical activity.  Eating a healthy diet.  Avoiding tobacco and drug use.  Limiting alcohol use.  Practicing safe sex.  Taking low-dose aspirin every day starting at age 26. What happens during an annual well check? The services and screenings done by your health care provider during your annual well check will depend on your age, overall health, lifestyle risk factors, and family history of disease. Counseling  Your health care provider may ask you questions about your:  Alcohol  use.  Tobacco use.  Drug use.  Emotional well-being.  Home and relationship well-being.  Sexual activity.  Eating habits.  Work and work Statistician. Screening  You may have the following tests or measurements:  Height, weight, and BMI.  Blood pressure.  Lipid and cholesterol levels. These may be checked every 5 years, or more frequently if you are over 34 years old.  Skin check.  Lung cancer screening. You may have this screening every year starting at age 76 if you have a 30-pack-year history of smoking and currently smoke or have quit within the past 15 years.  Fecal occult blood test (FOBT) of the stool. You may have this test every year starting at age 95.  Flexible sigmoidoscopy or colonoscopy. You may have a sigmoidoscopy every 5 years or a colonoscopy every 10 years starting at age 23.  Prostate cancer screening. Recommendations will vary depending on your family history and other risks.  Hepatitis C blood test.  Hepatitis B blood test.  Sexually transmitted disease (STD) testing.  Diabetes screening. This is done by checking your blood sugar (glucose) after you have not eaten for a while (fasting). You may have this done every 1-3 years. Discuss your test results, treatment options, and if necessary, the need for more tests with your health care provider. Vaccines  Your health care provider may recommend certain vaccines, such as:  Influenza vaccine. This is recommended every year.  Tetanus, diphtheria, and acellular pertussis (Tdap, Td) vaccine. You may need a Td booster every 10 years.  Zoster vaccine. You may need this after age 38.  Pneumococcal 13-valent conjugate (PCV13) vaccine. You may  need this if you have certain conditions and have not been vaccinated.  Pneumococcal polysaccharide (PPSV23) vaccine. You may need one or two doses if you smoke cigarettes or if you have certain conditions. Talk to your health care provider about which screenings  and vaccines you need and how often you need them. This information is not intended to replace advice given to you by your health care provider. Make sure you discuss any questions you have with your health care provider. Document Released: 11/21/2015 Document Revised: 07/14/2016 Document Reviewed: 08/26/2015 Elsevier Interactive Patient Education  2017 [place] Prevention in the Home Falls can cause injuries. They can happen to people of all ages. There are many things you can do to make your home safe and to help prevent falls. What can I do on the outside of my home?  Regularly fix the edges of walkways and driveways and fix any cracks.  Remove anything that might make you trip as you walk through a door, such as a raised step or threshold.  Trim any bushes or trees on the path to your home.  Use bright outdoor lighting.  Clear any walking paths of anything that might make someone trip, such as rocks or tools.  Regularly check to see if handrails are loose or broken. Make sure that both sides of any steps have handrails.  Any raised decks and porches should have guardrails on the edges.  Have any leaves, snow, or ice cleared regularly.  Use sand or salt on walking paths during winter.  Clean up any spills in your garage right away. This includes oil or grease spills. What can I do in the bathroom?  Use night lights.  Install grab bars by the toilet and in the tub and shower. Do not use towel bars as grab bars.  Use non-skid mats or decals in the tub or shower.  If you need to sit down in the shower, use a plastic, non-slip stool.  Keep the floor dry. Clean up any water that spills on the floor as soon as it happens.  Remove soap buildup in the tub or shower regularly.  Attach bath mats securely with double-sided non-slip rug tape.  Do not have throw rugs and other things on the floor that can make you trip. What can I do in the bedroom?  Use night  lights.  Make sure that you have a light by your bed that is easy to reach.  Do not use any sheets or blankets that are too big for your bed. They should not hang down onto the floor.  Have a firm chair that has side arms. You can use this for support while you get dressed.  Do not have throw rugs and other things on the floor that can make you trip. What can I do in the kitchen?  Clean up any spills right away.  Avoid walking on wet floors.  Keep items that you use a lot in easy-to-reach places.  If you need to reach something above you, use a strong step stool that has a grab bar.  Keep electrical cords out of the way.  Do not use floor polish or wax that makes floors slippery. If you must use wax, use non-skid floor wax.  Do not have throw rugs and other things on the floor that can make you trip. What can I do with my stairs?  Do not leave any items on the stairs.  Make sure that there are  handrails on both sides of the stairs and use them. Fix handrails that are broken or loose. Make sure that handrails are as long as the stairways.  Check any carpeting to make sure that it is firmly attached to the stairs. Fix any carpet that is loose or worn.  Avoid having throw rugs at the top or bottom of the stairs. If you do have throw rugs, attach them to the floor with carpet tape.  Make sure that you have a light switch at the top of the stairs and the bottom of the stairs. If you do not have them, ask someone to add them for you. What else can I do to help prevent falls?  Wear shoes that:  Do not have high heels.  Have rubber bottoms.  Are comfortable and fit you well.  Are closed at the toe. Do not wear sandals.  If you use a stepladder:  Make sure that it is fully opened. Do not climb a closed stepladder.  Make sure that both sides of the stepladder are locked into place.  Ask someone to hold it for you, if possible.  Clearly mark and make sure that you can  see:  Any grab bars or handrails.  First and last steps.  Where the edge of each step is.  Use tools that help you move around (mobility aids) if they are needed. These include:  Canes.  Walkers.  Scooters.  Crutches.  Turn on the lights when you go into a dark area. Replace any light bulbs as soon as they burn out.  Set up your furniture so you have a clear path. Avoid moving your furniture around.  If any of your floors are uneven, fix them.  If there are any pets around you, be aware of where they are.  Review your medicines with your doctor. Some medicines can make you feel dizzy. This can increase your chance of falling. Ask your doctor what other things that you can do to help prevent falls. This information is not intended to replace advice given to you by your health care provider. Make sure you discuss any questions you have with your health care provider. Document Released: 08/21/2009 Document Revised: 04/01/2016 Document Reviewed: 11/29/2014 Elsevier Interactive Patient Education  2017 [employer].

## 2018-01-11 NOTE — Patient Instructions (Addendum)
Will call with referral to GI for colonoscopy  Will call with lab results  Smoking cessation discussed and highly urged   RESTART ARICEPT TO PREVENT FURTHER MEMORY LOSS!  Continue current meds as ordered  Clonidine 0.1mg  tab given today for elevated blood pressure  Follow up in 3 mos for HTN, DM, depression, hyperlipidemia.

## 2018-01-12 LAB — CBC WITH DIFFERENTIAL/PLATELET
BASOS ABS: 32 {cells}/uL (ref 0–200)
BASOS PCT: 0.6 %
EOS ABS: 143 {cells}/uL (ref 15–500)
EOS PCT: 2.7 %
HEMATOCRIT: 42.8 % (ref 38.5–50.0)
Hemoglobin: 14.4 g/dL (ref 13.2–17.1)
LYMPHS ABS: 1680 {cells}/uL (ref 850–3900)
MCH: 29 pg (ref 27.0–33.0)
MCHC: 33.6 g/dL (ref 32.0–36.0)
MCV: 86.3 fL (ref 80.0–100.0)
MONOS PCT: 8.7 %
MPV: 13.3 fL — AB (ref 7.5–12.5)
NEUTROS ABS: 2984 {cells}/uL (ref 1500–7800)
Neutrophils Relative %: 56.3 %
Platelets: 133 10*3/uL — ABNORMAL LOW (ref 140–400)
RBC: 4.96 10*6/uL (ref 4.20–5.80)
RDW: 15.2 % — ABNORMAL HIGH (ref 11.0–15.0)
Total Lymphocyte: 31.7 %
WBC mixed population: 461 cells/uL (ref 200–950)
WBC: 5.3 10*3/uL (ref 3.8–10.8)

## 2018-01-12 LAB — COMPLETE METABOLIC PANEL WITH GFR
AG RATIO: 1.5 (calc) (ref 1.0–2.5)
ALBUMIN MSPROF: 4.6 g/dL (ref 3.6–5.1)
ALKALINE PHOSPHATASE (APISO): 133 U/L — AB (ref 40–115)
ALT: 55 U/L — ABNORMAL HIGH (ref 9–46)
AST: 42 U/L — AB (ref 10–35)
BUN: 19 mg/dL (ref 7–25)
CO2: 29 mmol/L (ref 20–32)
Calcium: 9.8 mg/dL (ref 8.6–10.3)
Chloride: 104 mmol/L (ref 98–110)
Creat: 1.3 mg/dL (ref 0.70–1.33)
GFR, Est African American: 69 mL/min/{1.73_m2} (ref >=60)
GFR, Est Non African American: 60 mL/min/{1.73_m2} (ref >=60)
GLOBULIN: 3.1 g/dL (ref 1.9–3.7)
Glucose, Bld: 115 mg/dL — ABNORMAL HIGH (ref 65–99)
POTASSIUM: 4.6 mmol/L (ref 3.5–5.3)
SODIUM: 141 mmol/L (ref 135–146)
Total Bilirubin: 0.5 mg/dL (ref 0.2–1.2)
Total Protein: 7.7 g/dL (ref 6.1–8.1)

## 2018-01-12 LAB — LIPID PANEL
CHOL/HDL RATIO: 3.1 (calc) (ref <5.0)
CHOLESTEROL: 153 mg/dL (ref <200)
HDL: 49 mg/dL (ref >40)
LDL Cholesterol (Calc): 77 mg/dL (calc)
NON-HDL CHOLESTEROL (CALC): 104 mg/dL (ref <130)
Triglycerides: 175 mg/dL — ABNORMAL HIGH (ref <150)

## 2018-01-12 LAB — HEMOGLOBIN A1C
HEMOGLOBIN A1C: 6.3 %{Hb} — AB (ref <5.7)
MEAN PLASMA GLUCOSE: 134 (calc)
eAG (mmol/L): 7.4 (calc)

## 2018-01-12 LAB — TSH: TSH: 1.59 mIU/L (ref 0.40–4.50)

## 2018-01-13 ENCOUNTER — Telehealth: Payer: Self-pay

## 2018-01-13 DIAGNOSIS — R7989 Other specified abnormal findings of blood chemistry: Secondary | ICD-10-CM

## 2018-01-13 DIAGNOSIS — R945 Abnormal results of liver function studies: Secondary | ICD-10-CM

## 2018-01-13 NOTE — Telephone Encounter (Signed)
Discussed results with patient, patient verbalized understanding of results  Updated medication list to reflect change in atorvastatin  Scheduled appointment to recheck hepatic function panel in April.  Order pending for signature   Copy of labs mailed to patient

## 2018-01-13 NOTE — Telephone Encounter (Signed)
-----   Message from [place], [place] sent at 01/13/2018  8:45 AM EST ----- Liver enzymes are elevated - reduce lipitor to 1/2 tablet daily; repeat hepatic function test in 1 month (elevated LFTs); diabetes controlled; platelet count reduced but improved compared to 1 yr ago; cholesterol much better; other labs stable; continue other medications as ordered; follow up as scheduled

## 2018-01-17 ENCOUNTER — Encounter: Payer: Self-pay | Admitting: Gastroenterology

## 2018-02-06 ENCOUNTER — Other Ambulatory Visit: Payer: Self-pay | Admitting: Internal Medicine

## 2018-02-07 ENCOUNTER — Other Ambulatory Visit: Payer: Medicare HMO

## 2018-02-07 DIAGNOSIS — R945 Abnormal results of liver function studies: Secondary | ICD-10-CM

## 2018-02-07 DIAGNOSIS — R7989 Other specified abnormal findings of blood chemistry: Secondary | ICD-10-CM

## 2018-02-07 LAB — HEPATIC FUNCTION PANEL
AG Ratio: 1.4 (calc) (ref 1.0–2.5)
ALBUMIN MSPROF: 4.5 g/dL (ref 3.6–5.1)
ALT: 29 U/L (ref 9–46)
AST: 24 U/L (ref 10–35)
Alkaline phosphatase (APISO): 126 U/L — ABNORMAL HIGH (ref 40–115)
BILIRUBIN DIRECT: 0.1 mg/dL (ref 0.0–0.2)
Globulin: 3.3 g/dL (calc) (ref 1.9–3.7)
Indirect Bilirubin: 0.4 mg/dL (calc) (ref 0.2–1.2)
TOTAL PROTEIN: 7.8 g/dL (ref 6.1–8.1)
Total Bilirubin: 0.5 mg/dL (ref 0.2–1.2)

## 2018-03-15 ENCOUNTER — Other Ambulatory Visit: Payer: Self-pay | Admitting: Internal Medicine

## 2018-03-15 DIAGNOSIS — I10 Essential (primary) hypertension: Secondary | ICD-10-CM

## 2018-03-16 ENCOUNTER — Other Ambulatory Visit: Payer: Self-pay

## 2018-03-16 ENCOUNTER — Ambulatory Visit (AMBULATORY_SURGERY_CENTER): Payer: Self-pay | Admitting: *Deleted

## 2018-03-16 VITALS — Ht 74.0 in | Wt 282.0 lb

## 2018-03-16 DIAGNOSIS — Z86010 Personal history of colonic polyps: Secondary | ICD-10-CM

## 2018-03-16 MED ORDER — NA SULFATE-K SULFATE-MG SULF 17.5-3.13-1.6 GM/177ML PO SOLN: ORAL | 0 refills | Status: DC

## 2018-03-16 NOTE — Progress Notes (Signed)
Sister-Brenda with patient during PV. Patient denies any allergies to eggs or soy. Patient denies any problems with anesthesia/sedation. Patient denies any oxygen use at home. Patient denies taking any diet/weight loss medications or blood thinners. EMMI education declined by pt and sister. Explained the importance in following the 2 day prep instructions with them both, [name] [name] states she will help him with the preps and clear liquid diet. She will bring him also.

## 2018-03-23 ENCOUNTER — Other Ambulatory Visit: Payer: Self-pay

## 2018-03-23 ENCOUNTER — Ambulatory Visit (AMBULATORY_SURGERY_CENTER): Payer: Medicare HMO | Admitting: Gastroenterology

## 2018-03-23 ENCOUNTER — Encounter: Payer: Self-pay | Admitting: Gastroenterology

## 2018-03-23 VITALS — BP 127/75 | HR 72 | Temp 97.1°F | Resp 18 | Ht 74.0 in | Wt 277.0 lb

## 2018-03-23 DIAGNOSIS — Z86010 Personal history of colonic polyps: Secondary | ICD-10-CM | POA: Diagnosis present

## 2018-03-23 DIAGNOSIS — D128 Benign neoplasm of rectum: Secondary | ICD-10-CM

## 2018-03-23 DIAGNOSIS — K621 Rectal polyp: Secondary | ICD-10-CM | POA: Diagnosis not present

## 2018-03-23 DIAGNOSIS — D123 Benign neoplasm of transverse colon: Secondary | ICD-10-CM | POA: Diagnosis not present

## 2018-03-23 DIAGNOSIS — Z1211 Encounter for screening for malignant neoplasm of colon: Secondary | ICD-10-CM | POA: Diagnosis not present

## 2018-03-23 DIAGNOSIS — D127 Benign neoplasm of rectosigmoid junction: Secondary | ICD-10-CM | POA: Diagnosis not present

## 2018-03-23 DIAGNOSIS — K635 Polyp of colon: Secondary | ICD-10-CM | POA: Diagnosis not present

## 2018-03-23 DIAGNOSIS — D126 Benign neoplasm of colon, unspecified: Secondary | ICD-10-CM | POA: Diagnosis not present

## 2018-03-23 DIAGNOSIS — D125 Benign neoplasm of sigmoid colon: Secondary | ICD-10-CM

## 2018-03-23 DIAGNOSIS — D129 Benign neoplasm of anus and anal canal: Secondary | ICD-10-CM

## 2018-03-23 MED ORDER — SODIUM CHLORIDE 0.9 % IV SOLN: 500.0000 mL | Freq: Once | INTRAVENOUS | Status: DC

## 2018-03-23 MED ORDER — FLEET ENEMA 7-19 GM/118ML RE ENEM
1.0000 | ENEMA | Freq: Once | RECTAL | Status: AC
  Administered 2018-03-23: 1 via RECTAL

## 2018-03-23 NOTE — Patient Instructions (Signed)
HANDOUTS GIVEN FOR POLYPS AND HEMORRHOIDS  YOU HAD AN ENDOSCOPIC PROCEDURE TODAY AT THE [facility]:   Refer to the procedure report that was given to you for any specific questions about what was found during the examination.  If the procedure report does not answer your questions, please call your gastroenterologist to clarify.  If you requested that your care partner not be given the details of your procedure findings, then the procedure report has been included in a sealed envelope for you to review at your convenience later.  YOU SHOULD EXPECT: Some feelings of bloating in the abdomen. Passage of more gas than usual.  Walking can help get rid of the air that was put into your GI tract during the procedure and reduce the bloating. If you had a lower endoscopy (such as a colonoscopy or flexible sigmoidoscopy) you may notice spotting of blood in your stool or on the toilet paper. If you underwent a bowel prep for your procedure, you may not have a normal bowel movement for a few days.  Please Note:  You might notice some irritation and congestion in your nose or some drainage.  This is from the oxygen used during your procedure.  There is no need for concern and it should clear up in a day or so.  SYMPTOMS TO REPORT IMMEDIATELY:   Following lower endoscopy (colonoscopy or flexible sigmoidoscopy):  Excessive amounts of blood in the stool  Significant tenderness or worsening of abdominal pains  Swelling of the abdomen that is new, acute  Fever of 100F or higher   For urgent or emergent issues, a gastroenterologist can be reached at any hour by calling [phone].   DIET:  We do recommend a small meal at first, but then you may proceed to your regular diet.  Drink plenty of fluids but you should avoid alcoholic beverages for 24 hours.  ACTIVITY:  You should plan to take it easy for the rest of today and you should NOT DRIVE or use heavy machinery until tomorrow (because of the  sedation medicines used during the test).    FOLLOW UP: Our staff will call the number listed on your records the next business day following your procedure to check on you and address any questions or concerns that you may have regarding the information given to you following your procedure. If we do not reach you, we will leave a message.  However, if you are feeling well and you are not experiencing any problems, there is no need to return our call.  We will assume that you have returned to your regular daily activities without incident.  If any biopsies were taken you will be contacted by phone or by letter within the next 1-3 weeks.  Please call us at [phone] if you have not heard about the biopsies in 3 weeks.    SIGNATURES/CONFIDENTIALITY: You and/or your care partner have signed paperwork which will be entered into your electronic medical record.  These signatures attest to the fact that that the information above on your After Visit Summary has been reviewed and is understood.  Full responsibility of the confidentiality of this discharge information lies with you and/or your care-partner. 

## 2018-03-23 NOTE — Progress Notes (Signed)
Pt verbalize taking both preps last night. Verbalize stools cloudy watery brown. Notified Dr. Fuller Plan, given order to give one fleets enema.Ambulated pt to the bathroon with Jani Gravel RN and given one fleets enema. Pt use bathroom x 2. First time brown/yellow liquid stool. Second time clear watery stool. Notified Dr. Fuller Plan.

## 2018-03-23 NOTE — Progress Notes (Signed)
Pt's states no medical or surgical changes since previsit or office visit.Pt's states no medical or surgical changes since previsit or office visit. 

## 2018-03-23 NOTE — Progress Notes (Signed)
Called to room to assist during endoscopic procedure.  Patient ID and intended procedure confirmed with present staff. Received instructions for my participation in the procedure from the performing physician.  

## 2018-03-23 NOTE — Progress Notes (Signed)
PATIENT WITH LEGS CROSSED RESULTING IN ELEVATED BP.UNCROSSED LEGS. BP CUFF TO RIGHT FOREARM 127/75. PATIENT DENIES PAIN OR ANY DIFFICULTY.

## 2018-03-23 NOTE — Op Note (Signed)
[place] Patient Name: [name] [name] Procedure Date: 03/23/2018 10:56 AM MRN: [id number] Endoscopist: Ladene Artist , MD Age: 57 Referring MD:  Date of Birth: Aug 27, 1961 Gender: Male Account #: [id number] Procedure:                Colonoscopy Indications:              Surveillance: Personal history of adenomatous                            polyps, inadequate prep on last colonoscopy (less                            than 2 years ago) Medicines:                Monitored Anesthesia Care Procedure:                Pre-Anesthesia Assessment:                           - Prior to the procedure, a History and Physical                            was performed, and patient medications and                            allergies were reviewed. The patient's tolerance of                            previous anesthesia was also reviewed. The risks                            and benefits of the procedure and the sedation                            options and risks were discussed with the patient.                            All questions were answered, and informed consent                            was obtained. Prior Anticoagulants: The patient has                            taken no previous anticoagulant or antiplatelet                            agents. ASA Grade Assessment: III - A patient with                            severe systemic disease. After reviewing the risks                            and benefits, the patient was deemed in  satisfactory condition to undergo the procedure.                           After obtaining informed consent, the colonoscope                            was passed under direct vision. Throughout the                            procedure, the patient's blood pressure, pulse, and                            oxygen saturations were monitored continuously. The                            Model PCF-H190DL [id number]) scope was  introduced                            through the anus and advanced to the the cecum,                            identified by appendiceal orifice and ileocecal                            valve. The ileocecal valve, appendiceal orifice,                            and rectum were photographed. The quality of the                            bowel preparation was good. The colonoscopy was                            performed without difficulty. The patient tolerated                            the procedure well. Scope In: 11:08:41 AM Scope Out: 11:22:50 AM Scope Withdrawal Time: 0 hours 11 minutes 51 seconds  Total Procedure Duration: 0 hours 14 minutes 9 seconds  Findings:                 The perianal and digital rectal examinations were                            normal.                           Four sessile polyps were found in the rectum (1),                            sigmoid colon (2) and transverse colon (1). The                            polyps were 6 to 7 mm in size. These polyps were  removed with a cold snare. Resection and retrieval                            were complete.                           A 4 mm polyp was found in the transverse colon. The                            polyp was sessile. The polyp was removed with a                            cold biopsy forceps. Resection and retrieval were                            complete.                           Internal hemorrhoids were found during                            retroflexion. The hemorrhoids were medium-sized and                            Grade I (internal hemorrhoids that do not prolapse).                           The exam was otherwise without abnormality on                            direct and retroflexion views. Complications:            No immediate complications. Estimated blood loss:                            None. Estimated Blood Loss:     Estimated blood loss:  none. Impression:               - One 4 mm polyp in the transverse colon, removed                            with a cold biopsy forceps. Resected and retrieved.                           - Four 6 to 7 mm polyps in the rectum, in the                            sigmoid colon and in the transverse colon, removed                            with a cold snare. Resected and retrieved.                           - Internal hemorrhoids.                           -  The examination was otherwise normal on direct                            and retroflexion views. Recommendation:           - Repeat colonoscopy in 3 - 5 years for                            surveillance pending pathology review with an                            extended bowel prep.                           - Patient has a contact number available for                            emergencies. The signs and symptoms of potential                            delayed complications were discussed with the                            patient. Return to normal activities tomorrow.                            Written discharge instructions were provided to the                            patient.                           - Resume previous diet.                           - Continue present medications.                           - Await pathology results. Ladene Artist, MD 03/23/2018 11:27:25 AM This report has been signed electronically.

## 2018-03-23 NOTE — Progress Notes (Signed)
A and O x3. Report to RN. Tolerated MAC anesthesia well.

## 2018-03-24 ENCOUNTER — Telehealth: Payer: Self-pay | Admitting: *Deleted

## 2018-03-24 NOTE — Telephone Encounter (Signed)
  Follow up Call-  Call back number 03/23/2018 10/06/2016  Post procedure Call Back phone  # [phone]  Permission to leave phone message Yes Yes  Some recent data might be hidden     Patient questions:  Do you have a fever, pain , or abdominal swelling? No. Pain Score  0 *  Have you tolerated food without any problems? Yes.    Have you been able to return to your normal activities? Yes.    Do you have any questions about your discharge instructions: Diet   No. Medications  No. Follow up visit  No.  Do you have questions or concerns about your Care? No.  Actions: * If pain score is 4 or above: No action needed, pain <4.

## 2018-04-02 ENCOUNTER — Encounter: Payer: Self-pay | Admitting: Gastroenterology

## 2018-04-14 ENCOUNTER — Ambulatory Visit: Payer: Medicare HMO | Admitting: Internal Medicine

## 2018-04-21 ENCOUNTER — Encounter: Payer: Self-pay | Admitting: Internal Medicine

## 2018-04-21 ENCOUNTER — Ambulatory Visit (INDEPENDENT_AMBULATORY_CARE_PROVIDER_SITE_OTHER): Payer: Medicare HMO | Admitting: Internal Medicine

## 2018-04-21 DIAGNOSIS — R748 Abnormal levels of other serum enzymes: Secondary | ICD-10-CM

## 2018-04-21 DIAGNOSIS — E785 Hyperlipidemia, unspecified: Secondary | ICD-10-CM | POA: Diagnosis not present

## 2018-04-21 DIAGNOSIS — G8110 Spastic hemiplegia affecting unspecified side: Secondary | ICD-10-CM

## 2018-04-21 DIAGNOSIS — I10 Essential (primary) hypertension: Secondary | ICD-10-CM

## 2018-04-21 DIAGNOSIS — I69351 Hemiplegia and hemiparesis following cerebral infarction affecting right dominant side: Secondary | ICD-10-CM | POA: Diagnosis not present

## 2018-04-21 DIAGNOSIS — E1169 Type 2 diabetes mellitus with other specified complication: Secondary | ICD-10-CM | POA: Diagnosis not present

## 2018-04-21 DIAGNOSIS — Z720 Tobacco use: Secondary | ICD-10-CM | POA: Diagnosis not present

## 2018-04-21 DIAGNOSIS — F0150 Vascular dementia without behavioral disturbance: Secondary | ICD-10-CM

## 2018-04-21 DIAGNOSIS — E1149 Type 2 diabetes mellitus with other diabetic neurological complication: Secondary | ICD-10-CM

## 2018-04-21 NOTE — Patient Instructions (Addendum)
Increase exercise as tolerated  Reduce calories to 1800 cals per day - AVOID HIGH SALT CONTAINING FOODS; no added salt to food  Continue current medications as ordered  STOP SMOKING!  Will call with lab results  Follow up in 3 mos for DM, HTN, hyperlipidemia, hx CVA   Heart-Healthy Eating Plan Heart-healthy meal planning includes:  Limiting unhealthy fats.  Increasing healthy fats.  Making other small dietary changes.  You may need to talk with your doctor or a diet specialist (dietitian) to create an eating plan that is right for you. What types of fat should I choose?  Choose healthy fats. These include olive oil and canola oil, flaxseeds, walnuts, almonds, and seeds.  Eat more omega-3 fats. These include salmon, mackerel, sardines, tuna, flaxseed oil, and ground flaxseeds. Try to eat fish at least twice each week.  Limit saturated fats. ? Saturated fats are often found in animal products, such as meats, butter, and cream. ? Plant sources of saturated fats include palm oil, palm kernel oil, and coconut oil.  Avoid foods with partially hydrogenated oils in them. These include stick margarine, some tub margarines, cookies, crackers, and other baked goods. These contain trans fats. What general guidelines do I need to follow?  Check food labels carefully. Identify foods with trans fats or high amounts of saturated fat.  Fill one half of your plate with vegetables and green salads. Eat 4-5 servings of vegetables per day. A serving of vegetables is: ? 1 cup of raw leafy vegetables. ?  cup of raw or cooked cut-up vegetables. ?  cup of vegetable juice.  Fill one fourth of your plate with whole grains. Look for the word "whole" as the first word in the ingredient list.  Fill one fourth of your plate with lean protein foods.  Eat 4-5 servings of fruit per day. A serving of fruit is: ? One medium whole fruit. ?  cup of dried fruit. ?  cup of fresh, frozen, or canned  fruit. ?  cup of 100% fruit juice.  Eat more foods that contain soluble fiber. These include apples, broccoli, carrots, beans, peas, and barley. Try to get 20-30 g of fiber per day.  Eat more home-cooked food. Eat less restaurant, buffet, and fast food.  Limit or avoid alcohol.  Limit foods high in starch and sugar.  Avoid fried foods.  Avoid frying your food. Try baking, boiling, grilling, or broiling it instead. You can also reduce fat by: ? Removing the skin from poultry. ? Removing all visible fats from meats. ? Skimming the fat off of stews, soups, and gravies before serving them. ? Steaming vegetables in water or broth.  Lose weight if you are overweight.  Eat 4-5 servings of nuts, legumes, and seeds per week: ? One serving of dried beans or legumes equals  cup after being cooked. ? One serving of nuts equals 1 ounces. ? One serving of seeds equals  ounce or one tablespoon.  You may need to keep track of how much salt or sodium you eat. This is especially true if you have high blood pressure. Talk with your doctor or dietitian to get more information. What foods can I eat? Grains Breads, including [place], white, pita, wheat, raisin, rye, oatmeal, and [place]. Tortillas that are neither fried nor made with lard or trans fat. Low-fat rolls, including hotdog and hamburger buns and English muffins. Biscuits. Muffins. Waffles. Pancakes. Light popcorn. Whole-grain cereals. Flatbread. Melba toast. Pretzels. Breadsticks. Rusks. Low-fat snacks. Low-fat crackers,  including oyster, saltine, matzo, graham, animal, and rye. Rice and pasta, including brown rice and pastas that are made with whole wheat. Vegetables All vegetables. Fruits All fruits, but limit coconut. Meats and Other Protein Sources Lean, well-trimmed beef, veal, pork, and lamb. Chicken and [place] without skin. All fish and shellfish. Wild duck, rabbit, pheasant, and venison. Egg whites or low-cholesterol egg  substitutes. Dried beans, peas, lentils, and tofu. Seeds and most nuts. Dairy Low-fat or nonfat cheeses, including ricotta, string, and mozzarella. Skim or 1% milk that is liquid, powdered, or evaporated. Buttermilk that is made with low-fat milk. Nonfat or low-fat yogurt. Beverages Mineral water. Diet carbonated beverages. Sweets and Desserts Sherbets and fruit ices. Honey, jam, marmalade, jelly, and syrups. Meringues and gelatins. Pure sugar candy, such as hard candy, jelly beans, gumdrops, mints, marshmallows, and small amounts of dark chocolate. [employer] Eat all sweets and desserts in moderation. Fats and Oils Nonhydrogenated (trans-free) margarines. Vegetable oils, including soybean, sesame, sunflower, olive, peanut, safflower, corn, canola, and cottonseed. Salad dressings or mayonnaise made with a vegetable oil. Limit added fats and oils that you use for cooking, baking, salads, and as spreads. Other Cocoa powder. Coffee and tea. All seasonings and condiments. The items listed above may not be a complete list of recommended foods or beverages. Contact your dietitian for more options. What foods are not recommended? Grains Breads that are made with saturated or trans fats, oils, or whole milk. Croissants. Butter rolls. Cheese breads. Sweet rolls. Donuts. Buttered popcorn. Chow mein noodles. High-fat crackers, such as cheese or butter crackers. Meats and Other Protein Sources Fatty meats, such as hotdogs, short ribs, sausage, spareribs, bacon, rib eye roast or steak, and mutton. High-fat deli meats, such as salami and bologna. Caviar. Domestic duck and goose. Organ meats, such as kidney, liver, sweetbreads, and heart. Dairy Cream, sour cream, cream cheese, and creamed cottage cheese. Whole-milk cheeses, including blue (bleu), Monterey Jack, [place], [place], American, [place], Swiss, cheddar, [place], and [place]. Whole or 2% milk that is liquid, evaporated, or condensed. Whole  buttermilk. Cream sauce or high-fat cheese sauce. Yogurt that is made from whole milk. Beverages Regular sodas and juice drinks with added sugar. Sweets and Desserts Frosting. Pudding. Cookies. Cakes other than angel food cake. Candy that has milk chocolate or white chocolate, hydrogenated fat, butter, coconut, or unknown ingredients. Buttered syrups. Full-fat ice cream or ice cream drinks. Fats and Oils Gravy that has suet, meat fat, or shortening. Cocoa butter, hydrogenated oils, palm oil, coconut oil, palm kernel oil. These can often be found in baked products, candy, fried foods, nondairy creamers, and whipped toppings. Solid fats and shortenings, including bacon fat, salt pork, lard, and butter. Nondairy cream substitutes, such as coffee creamers and sour cream substitutes. Salad dressings that are made of unknown oils, cheese, or sour cream. The items listed above may not be a complete list of foods and beverages to avoid. Contact your dietitian for more information. This information is not intended to replace advice given to you by your health care provider. Make sure you discuss any questions you have with your health care provider. Document Released: 04/25/2012 Document Revised: 04/01/2016 Document Reviewed: 04/18/2014 Elsevier Interactive Patient Education  [employer].

## 2018-04-21 NOTE — Progress Notes (Signed)
Patient ID: [name] [name], male   DOB: [birth year], 57 y.o.   MRN: [id number]   Location:  [facility] OFFICE  Provider: DR Arletha Grippe  Code Status:  Goals of Care:  Advanced Directives 01/11/2018  Does Patient Have a Medical Advance Directive? Yes  Type of Paramedic of [place];Living will  Does patient want to make changes to medical advance directive? No - Patient declined  Copy of [place] in Chart? No - copy requested  Would patient like information on creating a medical advance directive? -     Chief Complaint  Patient presents with  . Medical Management of Chronic Issues    3 month follow up. HTN, DM, Depression and hyperlipidemia.   . Medication Refill    Atorvastatin refill needed    HPI: Patient is a 57 y.o. male seen today for medical management of chronic diseases.  He has no concerns today. He lives alone in a 1st floor apt. No stairs to maneuver. No falls. He uses cane to ambulate. Weight up 6 lbs. He ate barbecue last night  No further rectal bleeding. He had a colonoscopy 03/23/18 with several polpy bx - path report (+) tubular adenoma and hyperplastic x 4 but no high grade dysplasia/malignancy.  Chronic tobacco abuse - approx 1/2 ppd. No plans to quit. He does walk every day. Maintains healthy balanced diet. No falls.   Hypertension - BP elevated today as he has not taken medications yet. He does not check BP at home. He is on 3 drug tx (norvasc, lisinopril, and hydralazine). He no longer needs prn clonidine. Cr 1.31  Dyslipidemia- stable on lipitor. TG 107; total chol 250; LDL 168. He no longer takes fenofibrate.  DM - diet controlled. A1c is 5.9%. 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 Urine microalbumin/Creatinine ratio 16.  Depression - mood stable on prozac  Hx CVA with dysphagia and left hemiparesis -  no signs of aspiration present. He does not require nectar thick liquids anymore. Peg tube removed prior to d/c from  SNF. Takes ASA 369m daily. He has neuropathy in LUE and has flexion contractures at elbow, wrist, hand and fingers. Pain controlled with norco daily, Aleve and Tylenol. Uses cane to ambulate. He lives alone in an apt.  H pylori infection - completed abx. Takes protonix BID. He c/o constipation. He is no longer taking senna. He drinks prune juice but causes loose stools. Linzess 746m samples worked well.  Vascular dementia - unchanged. He never resumed aricept. MMSE 23/30 today   Past Medical History:  Diagnosis Date  . Acute respiratory failure ([facility]  . Cerebral edema (HCC)   . Diabetes mellitus without complication (HCC)    diet controlled- no meds  . Dysphagia as late effect of cerebrovascular disease   . Fall at home   . Hemiparesis affecting left side as late effect of stroke ([facility]  . Hyperlipidemia   . Hypertension   . Stroke ([facility]  . Tuberculosis    11 yrs ago per pt.- treated with meds per pt.    Past Surgical History:  Procedure Laterality Date  . COLONOSCOPY  10/06/2016  . ESOPHAGOGASTRODUODENOSCOPY N/A 08/12/2015   Procedure: ESOPHAGOGASTRODUODENOSCOPY (EGD);  Surgeon: MaLadene ArtistMD;  [facility];  Service: Endoscopy;  Laterality: N/A;  . feeding tube removed    . GASTROSTOMY TUBE PLACEMENT  05-19-15  . mechanical thrombectomy angioplasty stenet placement    . SKIN GRAFT  1980's  ankle  . UPPER GASTROINTESTINAL ENDOSCOPY       reports that he has been smoking cigarettes.  He has been smoking about 0.50 packs per day. He has never used smokeless tobacco. He reports that he does not drink alcohol or use drugs. Social History   Socioeconomic History  . Marital status: Single    Spouse name: Not on file  . Number of children: Not on file  . Years of education: Not on file  . Highest education level: Not on file  Occupational History  . Not on file  Social Needs  . Financial resource strain: Not hard at all  . Food insecurity:    Worry:  Never true    Inability: Never true  . Transportation needs:    Medical: No    Non-medical: No  Tobacco Use  . Smoking status: Current Every Day Smoker    Packs/day: 0.50    Types: Cigarettes  . Smokeless tobacco: Never Used  Substance and Sexual Activity  . Alcohol use: No    Alcohol/week: 0.0 oz  . Drug use: No  . Sexual activity: Yes  Lifestyle  . Physical activity:    Days per week: 0 days    Minutes per session: 0 min  . Stress: Only a little  Relationships  . Social connections:    Talks on phone: More than three times a week    Gets together: More than three times a week    Attends religious service: Never    Active member of club or organization: No    Attends meetings of clubs or organizations: Never    Relationship status: Never married  . Intimate partner violence:    Fear of current or ex partner: No    Emotionally abused: No    Physically abused: No    Forced sexual activity: No  Other Topics Concern  . Not on file  Social History Narrative   Diet?  No salt      Do you drink/eat things with caffeine? none      Marital status?                                    What year were you married?      Do you live in a house, apartment, assisted living, condo, trailer, etc.? Apartment      Is it one or more stories? One, flat      How many persons live in your home?      Do you have any pets in your home? (please list)  no      Current or past profession:      Do you exercise?      no                                Type & how often?      Do you have a living will? no      Do you have a DNR form?    no                              If not, do you want to discuss one?      Do you have signed POA/HPOA for forms?        Family History  Problem Relation Age of Onset  . Alcohol abuse Sister   . Alcohol abuse Brother   . Alcohol abuse Father   . Colon cancer Neg Hx   . Colon polyps Neg Hx   . Rectal cancer Neg Hx   . Stomach cancer Neg Hx   .  Esophageal cancer Neg Hx     No Known Allergies  Outpatient Encounter Medications as of 04/21/2018  Medication Sig  . amLODipine (NORVASC) 10 MG tablet TAKE 1 TABLET BY MOUTH EVERY DAY  . aspirin 325 MG tablet Take 1 tablet (325 mg total) by mouth daily.  Marland Kitchen atorvastatin (LIPITOR) 80 MG tablet Take 0.5 tablets (40 mg total) by mouth daily.  . cloNIDine (CATAPRES) 0.1 MG tablet TAKE 1 TABLET BY MOUTH EVERY 8 HOURS AS NEEDED FOR HYPERTENSION  . donepezil (ARICEPT) 10 MG tablet TAKE 1 TABLET BY MOUTH EVERY DAY AT BEDTIME  . fenofibrate micronized (ANTARA) 43 MG capsule TAKE ONE CAPSULE BY MOUTH TWICE A DAY  . FLUoxetine (PROZAC) 10 MG capsule TAKE 1 CAPSULE BY MOUTH EVERY DAY  . hydrALAZINE (APRESOLINE) 10 MG tablet TAKE 1 TABLET BY MOUTH EVERY 8 HOURS FOR ESSENTIAL HYPERTENSION  . HYDROcodone-acetaminophen (NORCO) 5-325 MG tablet Take 1 tablet by mouth every 6 (six) hours as needed for moderate pain.  Marland Kitchen lisinopril (PRINIVIL,ZESTRIL) 20 MG tablet TAKE 1 TABLET BY MOUTH EVERY DAY (Patient not taking: Reported on 04/21/2018)  . pantoprazole (PROTONIX) 40 MG tablet TAKE 1 TABLET BY MOUTH TWICE A DAY (Patient not taking: Reported on 04/21/2018)   Facility-Administered Encounter Medications as of 04/21/2018  Medication  . 0.9 %  sodium chloride infusion    Review of Systems:  Review of Systems  Unable to perform ROS: Other (expressive aphasia)    Health Maintenance  Topic Date Due  . OPHTHALMOLOGY EXAM  11/09/1968  . TETANUS/TDAP  11/09/1977  . INFLUENZA VACCINE  06/08/2018  . HEMOGLOBIN A1C  07/14/2018  . FOOT EXAM  01/12/2019  . PNEUMOCOCCAL POLYSACCHARIDE VACCINE (2) 12/10/2021  . COLONOSCOPY  03/24/2023  . Hepatitis C Screening  Completed  . HIV Screening  Completed    Physical Exam: Vitals:   04/21/18 1034  BP: (!) 146/98  Pulse: 65  Temp: 98.1 F (36.7 C)  SpO2: 98%  Weight: 283 lb 9.6 oz (128.6 kg)  Height: 6' 2"  (1.88 m)   Body mass index is 36.41 kg/m. Physical Exam   Constitutional: He is oriented to person, place, and time. He appears well-developed and well-nourished.  HENT:  Mouth/Throat: Oropharynx is clear and moist.  MMM; no oral thrush  Eyes: Pupils are equal, round, and reactive to light. No scleral icterus.  Neck: Neck supple. Carotid bruit is not present. No thyromegaly present.  Cardiovascular: Normal rate, regular rhythm and intact distal pulses. Exam reveals no gallop and no friction rub.  Murmur (1/6 SEM) heard. Left sided swelling but no overt b/l LE edema. No calf TTP  Pulmonary/Chest: Effort normal and breath sounds normal. He has no wheezes. He has no rales. He exhibits no tenderness.  Abdominal: Soft. Bowel sounds are normal. He exhibits no distension, no abdominal bruit, no pulsatile midline mass and no mass. There is no hepatomegaly. There is no tenderness. There is no rebound and no guarding.  obese  Musculoskeletal: He exhibits edema and deformity (LUE flexion contracture).  Lymphadenopathy:    He has no cervical adenopathy.  Neurological: He is alert and oriented to person, place, and time. He has normal reflexes.  Left  hemiparesis; left facial droop  Skin: Skin is warm and dry. No rash noted.  Psychiatric: He has a normal mood and affect. His behavior is normal. Judgment and thought content normal.    Labs reviewed: Basic Metabolic Panel: Recent Labs    04/22/17 1148 07/29/17 0912 01/11/18 1148  NA 141 139 141  K 3.9 3.9 4.6  CL 105 104 104  CO2 26 25 29   GLUCOSE 87 94 115*  BUN 14 14 19   CREATININE 1.21 1.31 1.30  CALCIUM 9.5 9.5 9.8  TSH  --   --  1.59   Liver Function Tests: Recent Labs    04/22/17 1148 07/29/17 0912 01/11/18 1148 02/07/18 1411  AST 17  --  42* 24  ALT 20 22 55* 29  ALKPHOS 110  --   --   --   BILITOT 0.5  --  0.5 0.5  PROT 7.4  --  7.7 7.8  ALBUMIN 4.5  --   --   --    No results for input(s): LIPASE, AMYLASE in the last 8760 hours. No results for input(s): AMMONIA in the last  8760 hours. CBC: Recent Labs    01/11/18 1148  WBC 5.3  NEUTROABS 2,984  HGB 14.4  HCT 42.8  MCV 86.3  PLT 133*   Lipid Panel: Recent Labs    04/22/17 1148 07/29/17 0912 01/11/18 1148  CHOL 135 250* 153  HDL 45 50 49  LDLCALC 69 168* 77  TRIG 107 171* 175*  CHOLHDL 3.0 5.0* 3.1   Lab Results  Component Value Date   HGBA1C 6.3 (H) 01/11/2018    Procedures since last visit: No results found.  Assessment/Plan   ICD-10-CM   1. Morbid obesity (HCC) E66.01    BMI 36.41  2. Type II diabetes mellitus with neurological manifestations (HCC) E11.49 CMP with eGFR([facility])    Hemoglobin A1c  3. Essential hypertension I10   4. Tobacco abuse Z72.0   5. Dyslipidemia associated with type 2 diabetes mellitus (HCC) E11.69 Lipid Panel   E78.5   6. Spastic hemiplegia affecting nondominant side (HCC) G81.10   7. Vascular dementia without behavioral disturbance F01.50   8. Elevated alkaline phosphatase level R74.8    Increase exercise as tolerated  Reduce calories to 1800 cals per day - AVOID HIGH SALT CONTAINING FOODS; no added salt to food  Education handout provided  Continue current medications as ordered  STOP SMOKING!  Will call with lab results  Follow up in 3 mos for DM, HTN, hyperlipidemia, hx CVA     Elverta Dimiceli S. Perlie Gold  [facility] [address] [place], [place] [address] [phone] Cell (Monday-Friday 8 AM - 5 PM) [phone] After 5 PM and follow prompts

## 2018-04-22 LAB — HEMOGLOBIN A1C
EAG (MMOL/L): 7.3 (calc)
Hgb A1c MFr Bld: 6.2 % of total Hgb — ABNORMAL HIGH (ref <5.7)
Mean Plasma Glucose: 131 (calc)

## 2018-04-22 LAB — COMPLETE METABOLIC PANEL WITH GFR
AG RATIO: 1.5 (calc) (ref 1.0–2.5)
ALT: 52 U/L — ABNORMAL HIGH (ref 9–46)
AST: 36 U/L — AB (ref 10–35)
Albumin: 4.6 g/dL (ref 3.6–5.1)
Alkaline phosphatase (APISO): 141 U/L — ABNORMAL HIGH (ref 40–115)
BUN: 17 mg/dL (ref 7–25)
CALCIUM: 9.5 mg/dL (ref 8.6–10.3)
CO2: 30 mmol/L (ref 20–32)
Chloride: 104 mmol/L (ref 98–110)
Creat: 1.2 mg/dL (ref 0.70–1.33)
GFR, EST NON AFRICAN AMERICAN: 66 mL/min/{1.73_m2} (ref >=60)
GFR, Est African American: 76 mL/min/{1.73_m2} (ref >=60)
Globulin: 3.1 g/dL (calc) (ref 1.9–3.7)
Glucose, Bld: 72 mg/dL (ref 65–139)
POTASSIUM: 4.3 mmol/L (ref 3.5–5.3)
SODIUM: 140 mmol/L (ref 135–146)
Total Bilirubin: 0.4 mg/dL (ref 0.2–1.2)
Total Protein: 7.7 g/dL (ref 6.1–8.1)

## 2018-04-22 LAB — LIPID PANEL
Cholesterol: 135 mg/dL (ref <200)
HDL: 42 mg/dL (ref >40)
LDL Cholesterol (Calc): 66 mg/dL (calc)
NON-HDL CHOLESTEROL (CALC): 93 mg/dL (ref <130)
Total CHOL/HDL Ratio: 3.2 (calc) (ref <5.0)
Triglycerides: 209 mg/dL — ABNORMAL HIGH (ref <150)

## 2018-05-10 ENCOUNTER — Other Ambulatory Visit: Payer: Self-pay | Admitting: Internal Medicine

## 2018-06-28 ENCOUNTER — Encounter: Payer: Self-pay | Admitting: Internal Medicine

## 2018-07-25 ENCOUNTER — Other Ambulatory Visit: Payer: Self-pay | Admitting: Internal Medicine

## 2018-07-25 DIAGNOSIS — E1169 Type 2 diabetes mellitus with other specified complication: Secondary | ICD-10-CM

## 2018-07-25 DIAGNOSIS — E785 Hyperlipidemia, unspecified: Principal | ICD-10-CM

## 2018-07-25 MED ORDER — ATORVASTATIN CALCIUM 80 MG PO TABS: 40.0000 mg | ORAL_TABLET | Freq: Every day | ORAL | 0 refills | Status: DC

## 2018-07-25 NOTE — Telephone Encounter (Signed)
Refill request came over for Atorvastatin 80 mg.  Rx sent to pharmacy electronically

## 2018-07-26 ENCOUNTER — Ambulatory Visit: Payer: Medicare HMO | Admitting: Internal Medicine

## 2018-08-05 ENCOUNTER — Other Ambulatory Visit: Payer: Self-pay | Admitting: Internal Medicine

## 2018-09-28 ENCOUNTER — Other Ambulatory Visit: Payer: Self-pay | Admitting: *Deleted

## 2018-09-28 MED ORDER — ATORVASTATIN CALCIUM 80 MG PO TABS: 40.0000 mg | ORAL_TABLET | Freq: Every day | ORAL | 0 refills | Status: DC

## 2018-09-28 NOTE — Telephone Encounter (Signed)
Pended Rx and sent to [facility] for approval.  HIGH Alert Warning.

## 2018-11-22 ENCOUNTER — Telehealth: Payer: Self-pay

## 2018-11-22 ENCOUNTER — Other Ambulatory Visit: Payer: Self-pay | Admitting: Nurse Practitioner

## 2018-11-22 NOTE — Telephone Encounter (Signed)
Tried to call Daughter listed as contact person but number that was listed was disconnected. Called and spoke with patient about need for follow up appointment to go over medications. Patient had not been seen since June 2019 and was told to follow up in 3 months. Scheduled patient for follow up appointment with Dinah on Monday 11/27/2018. Patient confirmed appointment and stated he would be there.

## 2018-11-24 ENCOUNTER — Other Ambulatory Visit: Payer: Self-pay

## 2018-11-24 NOTE — Telephone Encounter (Signed)
Received high medication dose alert for pantoprazole

## 2018-11-27 ENCOUNTER — Ambulatory Visit (INDEPENDENT_AMBULATORY_CARE_PROVIDER_SITE_OTHER): Payer: Medicare HMO | Admitting: Family

## 2018-11-27 ENCOUNTER — Encounter: Payer: Self-pay | Admitting: Family

## 2018-11-27 VITALS — BP 170/80 | HR 71 | Temp 98.4°F | Ht 74.0 in | Wt 287.8 lb

## 2018-11-27 DIAGNOSIS — E1169 Type 2 diabetes mellitus with other specified complication: Secondary | ICD-10-CM | POA: Diagnosis not present

## 2018-11-27 DIAGNOSIS — F329 Major depressive disorder, single episode, unspecified: Secondary | ICD-10-CM | POA: Diagnosis not present

## 2018-11-27 DIAGNOSIS — E1149 Type 2 diabetes mellitus with other diabetic neurological complication: Secondary | ICD-10-CM | POA: Diagnosis not present

## 2018-11-27 DIAGNOSIS — E785 Hyperlipidemia, unspecified: Secondary | ICD-10-CM | POA: Diagnosis not present

## 2018-11-27 DIAGNOSIS — F0150 Vascular dementia without behavioral disturbance: Secondary | ICD-10-CM | POA: Diagnosis not present

## 2018-11-27 DIAGNOSIS — I10 Essential (primary) hypertension: Secondary | ICD-10-CM | POA: Diagnosis not present

## 2018-11-27 DIAGNOSIS — Z23 Encounter for immunization: Secondary | ICD-10-CM

## 2018-11-27 DIAGNOSIS — I69354 Hemiplegia and hemiparesis following cerebral infarction affecting left non-dominant side: Secondary | ICD-10-CM

## 2018-11-27 DIAGNOSIS — F32A Depression, unspecified: Secondary | ICD-10-CM

## 2018-11-27 MED ORDER — ATORVASTATIN CALCIUM 80 MG PO TABS: 40.0000 mg | ORAL_TABLET | Freq: Every day | ORAL | 0 refills | Status: DC

## 2018-11-27 MED ORDER — HYDRALAZINE HCL 10 MG PO TABS: ORAL_TABLET | ORAL | 1 refills | Status: DC

## 2018-11-27 MED ORDER — PANTOPRAZOLE SODIUM 40 MG PO TBEC: 40.0000 mg | DELAYED_RELEASE_TABLET | Freq: Two times a day (BID) | ORAL | 1 refills | Status: DC

## 2018-11-27 MED ORDER — TETANUS-DIPHTH-ACELL PERTUSSIS 5-2.5-18.5 LF-MCG/0.5 IM SUSP: 0.5000 mL | Freq: Once | INTRAMUSCULAR | 0 refills | Status: AC

## 2018-11-27 MED ORDER — FLUOXETINE HCL 10 MG PO CAPS: ORAL_CAPSULE | ORAL | 3 refills | Status: DC

## 2018-11-27 MED ORDER — HYDROCODONE-ACETAMINOPHEN 10-325 MG PO TABS: 1.0000 | ORAL_TABLET | Freq: Four times a day (QID) | ORAL | 0 refills | Status: DC | PRN

## 2018-11-27 NOTE — Progress Notes (Signed)
Provider: Darryl Blumenstein FNP-C   Lauree Chandler, NP  Patient Care Team: Lauree Chandler, NP as PCP - General (Geriatric Medicine) Gerlene Fee, NP as Nurse Practitioner ([facility] [place] ([place])  Extended Emergency Contact Information Primary Emergency Contact: [name],Nicole Address: [phone]          [place], [place] [address] Johnnette Litter of [place] Phone: [phone] Relation: Daughter Secondary Emergency Contact: Bonsignore,Gid  [place] Phone: [phone] Relation: Other   Goals of care: Advanced Directive information Advanced Directives 11/27/2018  Does Patient Have a Medical Advance Directive? No  Type of Advance Directive -  Does patient want to make changes to medical advance directive? -  Copy of [place] in Chart? -  Would patient like information on creating a medical advance directive? No - Patient declined     Chief Complaint  Patient presents with  . Follow-up    Medication Review   . Best Practice Recommendations    Patient would like to receive flu vaccine today, needs eye exam, A1C check, and TDap  . other    Patient would like to do therapy for his hand    HPI:  Pt is a 58 y.o. male seen today for medical management of chronic diseases. He request referral to therapy for left hand paralysis since 2016 had therapy post stroke with no improvement.He take Norco for pain.Needs refill today.   Hypertension -B/p reading elevated this visit.asymptomatic.He does not check his blood pressure at home.Currently on clonidine,Hydralazine and amlodipine.  Depression - on Prozac 10 mg capsule daily.no change in mood.   Dyslipidemia - on atorvastatin 40 mg tablet daily and fenofibrate 43 mg capsule.due for lipid panel recheck.  Type 2 DM- currently diet controlled.due for annual eye exam and Hgb A1C recheck.  Vascular Dementia- on Aricept 10 mg tablet  daily.poor historian.    Hx of stroke with left hemiplegia- left hand paralysis and pain.on Narco.Request referral for therapy though previous no improvement with therapy with left  4th and 5th fingers contracture.     Past Medical History:  Diagnosis Date  . Acute respiratory failure ([place])   . Cerebral edema (H[facility])   . Diabetes mellitus without complication (H[facility])    diet controlled- no meds  . Dysphagia as late effect of cerebrovascular disease   . Fall at home   . Hemiparesis affecting left side as late effect of stroke ([place]   . Hyperlipidemia   . Hypertension   . Stroke ([place]) 2016  . Tuberculosis    11 yrs ago per pt.- treated with meds per pt.   Past Surgical History:  Procedure Laterality Date  . COLONOSCOPY  10/06/2016  . ESOPHAGOGASTRODUODENOSCOPY N/A 08/12/2015   Procedure: ESOPHAGOGASTRODUODENOSCOPY (EGD);  Surgeon: Ladene Artist, MD;  Location[facility] ENDOSCOPY;  Service: Endoscopy;  Laterality: N/A;  . feeding tube removed    . GASTROSTOMY TUBE PLACEMENT  05-19-15  . mechanical thrombectomy angioplasty stenet placement    . SKIN GRAFT  1980's   ankle  . UPPER GASTROINTESTINAL ENDOSCOPY      No Known Allergies  Allergies as of 11/27/2018   No Known Allergies     Medication List       Accurate as of November 27, 2018  4:27 PM. Always use your most recent med list.        amLODipine 10 MG tablet Commonly known as:  NORVASC TAKE 1 TABLET BY MOUTH  EVERY DAY   aspirin 325 MG tablet Take 1 tablet (325 mg total) by mouth daily.   atorvastatin 80 MG tablet Commonly known as:  LIPITOR Take 0.5 tablets (40 mg total) by mouth daily. Need an appointment before anymore future refills.   cloNIDine 0.1 MG tablet Commonly known as:  CATAPRES TAKE 1 TABLET BY MOUTH EVERY 8 HOURS AS NEEDED FOR HYPERTENSION   donepezil 10 MG tablet Commonly known as:  ARICEPT TAKE 1 TABLET BY MOUTH EVERY DAY AT BEDTIME   fenofibrate micronized 43 MG capsule Commonly known as:   ANTARA TAKE ONE CAPSULE BY MOUTH TWICE A DAY   FLUoxetine 10 MG capsule Commonly known as:  PROZAC TAKE 1 CAPSULE BY MOUTH EVERY DAY   hydrALAZINE 10 MG tablet Commonly known as:  APRESOLINE TAKE 1 TABLET BY MOUTH EVERY 8 HOURS FOR ESSENTIAL HYPERTENSION   HYDROcodone-acetaminophen 10-325 MG tablet Commonly known as:  NORCO Take 1 tablet by mouth every 6 (six) hours as needed.   pantoprazole 40 MG tablet Commonly known as:  PROTONIX Take 1 tablet (40 mg total) by mouth 2 (two) times daily.   Tdap 5-2.5-18.5 LF-MCG/0.5 injection Commonly known as:  BOOSTRIX Inject 0.5 mLs into the muscle once for 1 dose.       Review of Systems  Unable to perform ROS: Dementia  Constitutional: Negative for appetite change, chills, fatigue and fever.  HENT: Negative for congestion, rhinorrhea, sinus pressure, sinus pain, sneezing, sore throat and trouble swallowing.   Eyes: Negative for discharge, redness, itching and visual disturbance.  Respiratory: Negative for cough, chest tightness, shortness of breath and wheezing.   Cardiovascular: Negative for chest pain, palpitations and leg swelling.  Gastrointestinal: Negative for abdominal distention, abdominal pain, constipation, diarrhea, nausea and vomiting.  Endocrine: Negative for cold intolerance, heat intolerance, polydipsia, polyphagia and polyuria.  Genitourinary: Negative for dysuria, flank pain, frequency and urgency.  Musculoskeletal: Positive for gait problem.  Skin: Negative for color change, pallor, rash and wound.  Neurological: Negative for dizziness, light-headedness and headaches.       Left hand paralysis  Hematological: Does not bruise/bleed easily.  Psychiatric/Behavioral: Negative for agitation and sleep disturbance. The patient is not nervous/anxious.     Immunization History  Administered Date(s) Administered  . Influenza,inj,Quad PF,6+ Mos 08/10/2015, 09/22/2016, 07/29/2017, 11/27/2018  . Pneumococcal  Polysaccharide-23 12/10/2016   Pertinent  Health Maintenance Due  Topic Date Due  . OPHTHALMOLOGY EXAM  11/09/1968  . HEMOGLOBIN A1C  10/21/2018  . FOOT EXAM  01/12/2019  . COLONOSCOPY  03/24/2023  . INFLUENZA VACCINE  Completed   Fall Risk  11/27/2018 04/21/2018 01/11/2018 01/11/2018 04/22/2017  Falls in the past year? 0 No No No No  Number falls in past yr: 0 - - - -  Injury with Fall? 0 - - - -  Risk for fall due to : - - - - -    Vitals:   11/27/18 1521  BP: (!) 170/80  Pulse: 71  Temp: 98.4 F (36.9 C)  TempSrc: Oral  SpO2: 95%  Weight: 287 lb 12.8 oz (130.5 kg)  Height: 6\' 2"  (1.88 m)   Body mass index is 36.95 kg/m. Physical Exam Vitals signs reviewed.  Constitutional:      General: He is not in acute distress.    Appearance: He is obese.  HENT:     Head: Normocephalic.     Right Ear: Tympanic membrane, ear canal and external ear normal. There is no impacted cerumen.  Left Ear: Ear canal and external ear normal. There is no impacted cerumen.     Nose: Nose normal. No congestion or rhinorrhea.     Mouth/Throat:     Mouth: Mucous membranes are moist.     Pharynx: Oropharynx is clear. No oropharyngeal exudate or posterior oropharyngeal erythema.  Eyes:     General: No scleral icterus.       Right eye: No discharge.        Left eye: No discharge.     Extraocular Movements: Extraocular movements intact.     Conjunctiva/sclera: Conjunctivae normal.     Pupils: Pupils are equal, round, and reactive to light.  Neck:     Musculoskeletal: Normal range of motion. No muscular tenderness.  Cardiovascular:     Rate and Rhythm: Normal rate and regular rhythm.     Pulses: Normal pulses.     Heart sounds: Normal heart sounds. No murmur. No friction rub. No gallop.   Pulmonary:     Effort: Pulmonary effort is normal. No respiratory distress.     Breath sounds: Normal breath sounds. No wheezing, rhonchi or rales.  Chest:     Chest wall: No tenderness.  Abdominal:      General: Bowel sounds are normal. There is no distension.     Palpations: Abdomen is soft. There is no mass.     Tenderness: There is no abdominal tenderness. There is no right CVA tenderness, left CVA tenderness, guarding or rebound.  Musculoskeletal:        General: No swelling or tenderness.     Right lower leg: No edema.     Left lower leg: No edema.     Comments: Unsteady gait ambulates with cane.Left hand paralysis with 4th and 5 th finger contracture   Lymphadenopathy:     Cervical: No cervical adenopathy.  Skin:    General: Skin is warm and dry.     Coloration: Skin is not pale.     Findings: No erythema or rash.  Neurological:     Mental Status: He is alert. Mental status is at baseline.     Gait: Gait abnormal.  Psychiatric:        Mood and Affect: Mood normal.        Speech: Speech is slurred.        Behavior: Behavior normal.        Thought Content: Thought content normal.        Cognition and Memory: He exhibits impaired recent memory.        Judgment: Judgment normal.    Labs reviewed: Recent Labs    01/11/18 1148 04/21/18 1113  NA 141 140  K 4.6 4.3  CL 104 104  CO2 29 30  GLUCOSE 115* 72  BUN 19 17  CREATININE 1.30 1.20  CALCIUM 9.8 9.5   Recent Labs    01/11/18 1148 02/07/18 1411 04/21/18 1113  AST 42* 24 36*  ALT 55* 29 52*  BILITOT 0.5 0.5 0.4  PROT 7.7 7.8 7.7   Recent Labs    01/11/18 1148  WBC 5.3  NEUTROABS 2,984  HGB 14.4  HCT 42.8  MCV 86.3  PLT 133*   Lab Results  Component Value Date   TSH 1.59 01/11/2018   Lab Results  Component Value Date   HGBA1C 6.2 (H) 04/21/2018   Lab Results  Component Value Date   CHOL 135 04/21/2018   HDL 42 04/21/2018   LDLCALC 66 04/21/2018   TRIG 209 (H)  04/21/2018   CHOLHDL 3.2 04/21/2018    Significant Diagnostic Results in last 30 days:  No results found.  Assessment/Plan 1. Essential hypertension Continue current medication. - Refill hydrALAZINE (APRESOLINE) 10 MG tablet;  TAKE 1 TABLET BY MOUTH EVERY 8 HOURS FOR ESSENTIAL HYPERTENSION  Dispense: 270 tablet; Refill: 1 -check CBC/diff,CMP and TSH level  2. Need for influenza vaccination Afebrile.Flu vaccine given by [place] .monitor for signs of reactions. - Flu Vaccine QUAD 6+ mos PF IM (Fluarix Quad PF)  3. Need for Tdap vaccination Afebrile. - Tdap (BOOSTRIX) 5-2.5-18.5 LF-MCG/0.5 injection; Inject 0.5 mLs into the muscle once for 1 dose.  Dispense: 0.5 mL; Refill: 0  4. Depression, unspecified depression type Mood stable.continue on Prozac 10 mg capsule.monitor for mood changes.check TSH level.    5. Dyslipidemia associated with type 2 diabetes mellitus ([place]) Continue on atorvastatin 40 mg tablet and fenofibrate 43 mg tablet daily.check fasting lipid panel.   6. Type II diabetes mellitus with neurological manifestations ([place]) Diet controlled.ordering Hgb A1C this visit.Refer to Ophthalmology for annual eye exam rule out diabetic retinopathy. On ASA and Statin.   7. Vascular dementia without behavioral disturbance ([place]) No new behavioral issues.continue on Aricept 10 mg tablet daily.  8.Hx stroke with left side hemiparesis  Continue on to manage high risk factors.On ASA and Statin.continue on Norco for pain.Medication refilled via e-scribe this visit.I don't think therapy will beneficial for left hand 4 th and 5 th contracture after several years this has been discussed with patient during visit verbalized understanding.   Family/ staff Communication: Reviewed plan of care with patient   Labs/tests ordered: CBC/diff,CMP,Hgb A1C,FAsting Lipid panel and TSH level 11/28/2018  Follow up : 3 Months with available provider for medical management.   Sandrea Hughs, NP

## 2018-12-01 ENCOUNTER — Other Ambulatory Visit: Payer: Medicare HMO

## 2018-12-20 ENCOUNTER — Encounter: Payer: Self-pay | Admitting: Family

## 2019-01-15 ENCOUNTER — Encounter: Payer: Self-pay | Admitting: Family

## 2019-01-15 ENCOUNTER — Ambulatory Visit: Payer: Self-pay

## 2019-01-16 ENCOUNTER — Ambulatory Visit (INDEPENDENT_AMBULATORY_CARE_PROVIDER_SITE_OTHER): Payer: Medicare HMO | Admitting: Family

## 2019-01-16 ENCOUNTER — Telehealth: Payer: Self-pay | Admitting: *Deleted

## 2019-01-16 ENCOUNTER — Encounter: Payer: Self-pay | Admitting: Family

## 2019-01-16 VITALS — BP 158/84 | HR 77 | Temp 98.1°F | Ht 74.0 in | Wt 281.6 lb

## 2019-01-16 DIAGNOSIS — Z0000 Encounter for general adult medical examination without abnormal findings: Secondary | ICD-10-CM | POA: Diagnosis not present

## 2019-01-16 DIAGNOSIS — E1149 Type 2 diabetes mellitus with other diabetic neurological complication: Secondary | ICD-10-CM | POA: Diagnosis not present

## 2019-01-16 DIAGNOSIS — E785 Hyperlipidemia, unspecified: Secondary | ICD-10-CM

## 2019-01-16 DIAGNOSIS — F329 Major depressive disorder, single episode, unspecified: Secondary | ICD-10-CM | POA: Diagnosis not present

## 2019-01-16 DIAGNOSIS — Z23 Encounter for immunization: Secondary | ICD-10-CM | POA: Diagnosis not present

## 2019-01-16 DIAGNOSIS — I10 Essential (primary) hypertension: Secondary | ICD-10-CM | POA: Diagnosis not present

## 2019-01-16 DIAGNOSIS — Z122 Encounter for screening for malignant neoplasm of respiratory organs: Secondary | ICD-10-CM

## 2019-01-16 DIAGNOSIS — F17200 Nicotine dependence, unspecified, uncomplicated: Secondary | ICD-10-CM | POA: Diagnosis not present

## 2019-01-16 DIAGNOSIS — E1169 Type 2 diabetes mellitus with other specified complication: Secondary | ICD-10-CM | POA: Diagnosis not present

## 2019-01-16 MED ORDER — CLONIDINE HCL 0.1 MG PO TABS: ORAL_TABLET | ORAL | 0 refills | Status: DC

## 2019-01-16 MED ORDER — HYDROCODONE-ACETAMINOPHEN 10-325 MG PO TABS: 1.0000 | ORAL_TABLET | Freq: Four times a day (QID) | ORAL | 0 refills | Status: DC | PRN

## 2019-01-16 MED ORDER — TETANUS-DIPHTH-ACELL PERTUSSIS 5-2-15.5 LF-MCG/0.5 IM SUSP: 0.5000 mL | Freq: Once | INTRAMUSCULAR | 0 refills | Status: AC

## 2019-01-16 MED ORDER — AMLODIPINE BESYLATE 10 MG PO TABS: 10.0000 mg | ORAL_TABLET | Freq: Every day | ORAL | 1 refills | Status: DC

## 2019-01-16 MED ORDER — FLUOXETINE HCL 10 MG PO CAPS: ORAL_CAPSULE | ORAL | 3 refills | Status: DC

## 2019-01-16 MED ORDER — FENOFIBRATE MICRONIZED 43 MG PO CAPS: 43.0000 mg | ORAL_CAPSULE | Freq: Two times a day (BID) | ORAL | 3 refills | Status: DC

## 2019-01-16 MED ORDER — ATORVASTATIN CALCIUM 80 MG PO TABS: 40.0000 mg | ORAL_TABLET | Freq: Every day | ORAL | 0 refills | Status: DC

## 2019-01-16 MED ORDER — HYDRALAZINE HCL 10 MG PO TABS: ORAL_TABLET | ORAL | 1 refills | Status: DC

## 2019-01-16 MED ORDER — PANTOPRAZOLE SODIUM 40 MG PO TBEC: 40.0000 mg | DELAYED_RELEASE_TABLET | Freq: Two times a day (BID) | ORAL | 1 refills | Status: DC

## 2019-01-16 NOTE — Patient Instructions (Signed)
[name] [name] , Thank you for taking time to come for your Medicare Wellness Visit. I appreciate your ongoing commitment to your health goals. Please review the following plan we discussed and let me know if I can assist you in the future.   Screening recommendations/referrals: Colonoscopy: Up to date due 03/24/2023  Recommended yearly ophthalmology/optometry visit for glaucoma screening and checkup Recommended yearly dental visit for hygiene and checkup  Vaccinations: Influenza vaccine :Up to date  Pneumococcal vaccine: Prevnar 13 administered today Tdap vaccine: Ordered this visit  Shingles vaccine: Declined     Advanced directives:Declined   Conditions/risks identified: advance age men > 72 years old,Type 2 DM,Hyperlipidemia,male [place] lifestyle,smoker.   Next appointment: 1 year   Preventive Care 50 Years and Older, Male Preventive care refers to lifestyle choices and visits with your health care provider that can promote health and wellness. What does preventive care include?  A yearly physical exam. This is also called an annual well check.  Dental exams once or twice a year.  Routine eye exams. Ask your health care provider how often you should have your eyes checked.  Personal lifestyle choices, including:  Daily care of your teeth and gums.  Regular physical activity.  Eating a healthy diet.  Avoiding tobacco and drug use.  Limiting alcohol use.  Practicing safe sex.  Taking low doses of aspirin every day.  Taking vitamin and mineral supplements as recommended by your health care provider. What happens during an annual well check? The services and screenings done by your health care provider during your annual well check will depend on your age, overall health, lifestyle risk factors, and family history of disease. Counseling  Your health care provider may ask you questions about your:  Alcohol use.  Tobacco use.  Drug use.  Emotional  well-being.  Home and relationship well-being.  Sexual activity.  Eating habits.  History of falls.  Memory and ability to understand (cognition).  Work and work Statistician. Screening  You may have the following tests or measurements:  Height, weight, and BMI.  Blood pressure.  Lipid and cholesterol levels. These may be checked every 5 years, or more frequently if you are over 36 years old.  Skin check.  Lung cancer screening. You may have this screening every year starting at age 67 if you have a 30-pack-year history of smoking and currently smoke or have quit within the past 15 years.  Fecal occult blood test (FOBT) of the stool. You may have this test every year starting at age 9.  Flexible sigmoidoscopy or colonoscopy. You may have a sigmoidoscopy every 5 years or a colonoscopy every 10 years starting at age 57.  Prostate cancer screening. Recommendations will vary depending on your family history and other risks.  Hepatitis C blood test.  Hepatitis B blood test.  Sexually transmitted disease (STD) testing.  Diabetes screening. This is done by checking your blood sugar (glucose) after you have not eaten for a while (fasting). You may have this done every 1-3 years.  Abdominal aortic aneurysm (AAA) screening. You may need this if you are a current or former smoker.  Osteoporosis. You may be screened starting at age 21 if you are at high risk. Talk with your health care provider about your test results, treatment options, and if necessary, the need for more tests. Vaccines  Your health care provider may recommend certain vaccines, such as:  Influenza vaccine. This is recommended every year.  Tetanus, diphtheria, and acellular pertussis (Tdap, Td)  vaccine. You may need a Td booster every 10 years.  Zoster vaccine. You may need this after age 29.  Pneumococcal 13-valent conjugate (PCV13) vaccine. One dose is recommended after age 72.  Pneumococcal  polysaccharide (PPSV23) vaccine. One dose is recommended after age 30. Talk to your health care provider about which screenings and vaccines you need and how often you need them. This information is not intended to replace advice given to you by your health care provider. Make sure you discuss any questions you have with your health care provider. Document Released: 11/21/2015 Document Revised: 07/14/2016 Document Reviewed: 08/26/2015 Elsevier Interactive Patient Education  2017 [employer] Prevention in the Home Falls can cause injuries. They can happen to people of all ages. There are many things you can do to make your home safe and to help prevent falls. What can I do on the outside of my home?  Regularly fix the edges of walkways and driveways and fix any cracks.  Remove anything that might make you trip as you walk through a door, such as a raised step or threshold.  Trim any bushes or trees on the path to your home.  Use bright outdoor lighting.  Clear any walking paths of anything that might make someone trip, such as rocks or tools.  Regularly check to see if handrails are loose or broken. Make sure that both sides of any steps have handrails.  Any raised decks and porches should have guardrails on the edges.  Have any leaves, snow, or ice cleared regularly.  Use sand or salt on walking paths during winter.  Clean up any spills in your garage right away. This includes oil or grease spills. What can I do in the bathroom?  Use night lights.  Install grab bars by the toilet and in the tub and shower. Do not use towel bars as grab bars.  Use non-skid mats or decals in the tub or shower.  If you need to sit down in the shower, use a plastic, non-slip stool.  Keep the floor dry. Clean up any water that spills on the floor as soon as it happens.  Remove soap buildup in the tub or shower regularly.  Attach bath mats securely with double-sided non-slip rug  tape.  Do not have throw rugs and other things on the floor that can make you trip. What can I do in the bedroom?  Use night lights.  Make sure that you have a light by your bed that is easy to reach.  Do not use any sheets or blankets that are too big for your bed. They should not hang down onto the floor.  Have a firm chair that has side arms. You can use this for support while you get dressed.  Do not have throw rugs and other things on the floor that can make you trip. What can I do in the kitchen?  Clean up any spills right away.  Avoid walking on wet floors.  Keep items that you use a lot in easy-to-reach places.  If you need to reach something above you, use a strong step stool that has a grab bar.  Keep electrical cords out of the way.  Do not use floor polish or wax that makes floors slippery. If you must use wax, use non-skid floor wax.  Do not have throw rugs and other things on the floor that can make you trip. What can I do with my stairs?  Do not leave  any items on the stairs.  Make sure that there are handrails on both sides of the stairs and use them. Fix handrails that are broken or loose. Make sure that handrails are as long as the stairways.  Check any carpeting to make sure that it is firmly attached to the stairs. Fix any carpet that is loose or worn.  Avoid having throw rugs at the top or bottom of the stairs. If you do have throw rugs, attach them to the floor with carpet tape.  Make sure that you have a light switch at the top of the stairs and the bottom of the stairs. If you do not have them, ask someone to add them for you. What else can I do to help prevent falls?  Wear shoes that:  Do not have high heels.  Have rubber bottoms.  Are comfortable and fit you well.  Are closed at the toe. Do not wear sandals.  If you use a stepladder:  Make sure that it is fully opened. Do not climb a closed stepladder.  Make sure that both sides of the  stepladder are locked into place.  Ask someone to hold it for you, if possible.  Clearly mark and make sure that you can see:  Any grab bars or handrails.  First and last steps.  Where the edge of each step is.  Use tools that help you move around (mobility aids) if they are needed. These include:  Canes.  Walkers.  Scooters.  Crutches.  Turn on the lights when you go into a dark area. Replace any light bulbs as soon as they burn out.  Set up your furniture so you have a clear path. Avoid moving your furniture around.  If any of your floors are uneven, fix them.  If there are any pets around you, be aware of where they are.  Review your medicines with your doctor. Some medicines can make you feel dizzy. This can increase your chance of falling. Ask your doctor what other things that you can do to help prevent falls. This information is not intended to replace advice given to you by your health care provider. Make sure you discuss any questions you have with your health care provider. Document Released: 08/21/2009 Document Revised: 04/01/2016 Document Reviewed: 11/29/2014 Elsevier Interactive Patient Education  2017 [employer].

## 2019-01-16 NOTE — Progress Notes (Signed)
Subjective:   [name] [name]. is a 58 y.o. male who presents for Medicare Annual/Subsequent preventive examination.  Review of Systems:   Cardiac Risk Factors include: advanced age (>41men, >28 women);diabetes mellitus;dyslipidemia;hypertension;male gender;obesity (BMI >30kg/m2);sedentary lifestyle;smoking/ tobacco exposure     Objective:    Vitals: BP (!) 158/84   Pulse 77   Temp 98.1 F (36.7 C) (Oral)   Ht 6\' 2"  (1.88 m)   Wt 281 lb 9.6 oz (127.7 kg)   SpO2 97%   BMI 36.16 kg/m   Body mass index is 36.16 kg/m.  Advanced Directives 01/16/2019 11/27/2018 01/11/2018 07/29/2017 04/22/2017 12/24/2016 12/10/2016  Does Patient Have a Medical Advance Directive? No No Yes No No Yes Yes  Type of Advance Directive - Public librarian;Living will - - Healthcare Power of [place]  Does patient want to make changes to medical advance directive? - - No - Patient declined - - - -  Copy of [place] in Chart? - - No - copy requested - - No - copy requested No - copy requested  Would patient like information on creating a medical advance directive? No - Patient declined No - Patient declined - Yes (ED - Information included in AVS) No - Patient declined - -    Tobacco Social History   Tobacco Use  Smoking Status Current Every Day Smoker  . Packs/day: 0.50  . Types: Cigarettes  Smokeless Tobacco Never Used     Ready to quit: Not Answered Counseling given: Not Answered   Clinical Intake:  Pre-visit preparation completed: No  Pain : 0-10 Pain Score: 9  Pain Type: Chronic pain Pain Location: Arm Pain Orientation: Left Pain Radiating Towards: none  Pain Descriptors / Indicators: Sharp Pain Onset: Other (comment)(4 years ) Pain Frequency: Intermittent Pain Relieving Factors: pain medication and exercise  Effect of Pain on Daily Activities: unable to use left hand   Pain Relieving Factors: pain medication and exercise    BMI - recorded: 36.16 Nutritional Status: BMI > 30  Obese Nutritional Risks: None Diabetes: No  How often do you need to have someone help you when you read instructions, pamphlets, or other written materials from your doctor or pharmacy?: 5 - Always What is the last grade level you completed in school?: 12 Grade   Interpreter Needed?: No  Information entered by :: Julitza Rickles FNP-C   Past Medical History:  Diagnosis Date  . Acute respiratory failure ([place])   . Cerebral edema (HCC)   . Diabetes mellitus without complication (HCC)    diet controlled- no meds  . Dysphagia as late effect of cerebrovascular disease   . Fall at home   . Hemiparesis affecting left side as late effect of stroke ([place])   . Hyperlipidemia   . Hypertension   . Stroke ([place]) 2016  . Tuberculosis    11 yrs ago per pt.- treated with meds per pt.   Past Surgical History:  Procedure Laterality Date  . COLONOSCOPY  10/06/2016  . ESOPHAGOGASTRODUODENOSCOPY N/A 08/12/2015   Procedure: ESOPHAGOGASTRODUODENOSCOPY (EGD);  Surgeon: Ladene Artist, MD;  Location: [facility] ENDOSCOPY;  Service: Endoscopy;  Laterality: N/A;  . feeding tube removed    . GASTROSTOMY TUBE PLACEMENT  05-19-15  . mechanical thrombectomy angioplasty stenet placement    . SKIN GRAFT  1980's   ankle  . UPPER GASTROINTESTINAL ENDOSCOPY     Family History  Problem Relation Age of Onset  . Alcohol abuse  Sister   . Alcohol abuse Brother   . Alcohol abuse Father   . Colon cancer Neg Hx   . Colon polyps Neg Hx   . Rectal cancer Neg Hx   . Stomach cancer Neg Hx   . Esophageal cancer Neg Hx    Social History   Socioeconomic History  . Marital status: Single    Spouse name: Not on file  . Number of children: Not on file  . Years of education: Not on file  . Highest education level: Not on file  Occupational History  . Not on file  Social Needs  . Financial resource strain: Not hard at all  . Food insecurity:    Worry: Never true     Inability: Never true  . Transportation needs:    Medical: No    Non-medical: No  Tobacco Use  . Smoking status: Current Every Day Smoker    Packs/day: 0.50    Types: Cigarettes  . Smokeless tobacco: Never Used  Substance and Sexual Activity  . Alcohol use: No    Alcohol/week: 0.0 standard drinks  . Drug use: No  . Sexual activity: Yes  Lifestyle  . Physical activity:    Days per week: 0 days    Minutes per session: 0 min  . Stress: Only a little  Relationships  . Social connections:    Talks on phone: More than three times a week    Gets together: More than three times a week    Attends religious service: Never    Active member of club or organization: No    Attends meetings of clubs or organizations: Never    Relationship status: Never married  Other Topics Concern  . Not on file  Social History Narrative   Diet?  No salt      Do you drink/eat things with caffeine? none      Marital status?                                    What year were you married?      Do you live in a house, apartment, assisted living, condo, trailer, etc.? Apartment      Is it one or more stories? One, flat      How many persons live in your home?      Do you have any pets in your home? (please list)  no      Current or past profession:      Do you exercise?      no                                Type & how often?      Do you have a living will? no      Do you have a DNR form?    no                              If not, do you want to discuss one?      Do you have signed POA/HPOA for forms?        Outpatient Encounter Medications as of 01/16/2019  Medication Sig  . amLODipine (NORVASC) 10 MG tablet Take 1 tablet (10 mg total) by mouth daily.  Marland Kitchen aspirin 325 MG tablet  Take 1 tablet (325 mg total) by mouth daily.  Marland Kitchen atorvastatin (LIPITOR) 80 MG tablet Take 0.5 tablets (40 mg total) by mouth daily. Need an appointment before anymore future refills.  . cloNIDine (CATAPRES) 0.1 MG tablet  TAKE 1 TABLET BY MOUTH EVERY 8 HOURS AS NEEDED FOR HYPERTENSION  . FLUoxetine (PROZAC) 10 MG capsule TAKE 1 CAPSULE BY MOUTH EVERY DAY  . hydrALAZINE (APRESOLINE) 10 MG tablet TAKE 1 TABLET BY MOUTH EVERY 8 HOURS FOR ESSENTIAL HYPERTENSION  . HYDROcodone-acetaminophen (NORCO) 10-325 MG tablet Take 1 tablet by mouth every 6 (six) hours as needed.  . pantoprazole (PROTONIX) 40 MG tablet Take 1 tablet (40 mg total) by mouth 2 (two) times daily.  . Tdap (ADACEL) 03-09-14.5 LF-MCG/0.5 injection Inject 0.5 mLs into the muscle once for 1 dose.  . [DISCONTINUED] amLODipine (NORVASC) 10 MG tablet TAKE 1 TABLET BY MOUTH EVERY DAY  . [DISCONTINUED] atorvastatin (LIPITOR) 80 MG tablet Take 0.5 tablets (40 mg total) by mouth daily. Need an appointment before anymore future refills.  . [DISCONTINUED] cloNIDine (CATAPRES) 0.1 MG tablet TAKE 1 TABLET BY MOUTH EVERY 8 HOURS AS NEEDED FOR HYPERTENSION  . [DISCONTINUED] FLUoxetine (PROZAC) 10 MG capsule TAKE 1 CAPSULE BY MOUTH EVERY DAY  . [DISCONTINUED] hydrALAZINE (APRESOLINE) 10 MG tablet TAKE 1 TABLET BY MOUTH EVERY 8 HOURS FOR ESSENTIAL HYPERTENSION  . [DISCONTINUED] HYDROcodone-acetaminophen (NORCO) 10-325 MG tablet Take 1 tablet by mouth every 6 (six) hours as needed.  . [DISCONTINUED] pantoprazole (PROTONIX) 40 MG tablet Take 1 tablet (40 mg total) by mouth 2 (two) times daily.  . [DISCONTINUED] Tdap (ADACEL) 03-09-14.5 LF-MCG/0.5 injection Inject 0.5 mLs into the muscle once.  . donepezil (ARICEPT) 10 MG tablet TAKE 1 TABLET BY MOUTH EVERY DAY AT BEDTIME (Patient not taking: Reported on 01/16/2019)  . fenofibrate micronized (ANTARA) 43 MG capsule Take 1 capsule (43 mg total) by mouth 2 (two) times daily.  . [DISCONTINUED] fenofibrate micronized (ANTARA) 43 MG capsule TAKE ONE CAPSULE BY MOUTH TWICE A DAY (Patient not taking: Reported on 01/16/2019)   Facility-Administered Encounter Medications as of 01/16/2019  Medication  . 0.9 %  sodium chloride infusion     Activities of Daily Living In your present state of health, do you have any difficulty performing the following activities: 01/16/2019  Hearing? N  Vision? N  Difficulty concentrating or making decisions? N  Walking or climbing stairs? Y  Comment uses a cane and has left arm paralysis   Dressing or bathing? Y  Comment assisted by daughter   Doing errands, shopping? Y  Comment Daughter Scientist, water quality and eating ? N  Using the Toilet? N  In the past six months, have you accidently leaked urine? N  Do you have problems with loss of bowel control? N  Managing your Medications? N  Managing your Finances? N  Housekeeping or managing your Housekeeping? Y  Some recent data might be hidden    Patient Care Team: Asriel Westrup, Nelda Bucks, NP as PCP - General (Family Medicine) Center, [place] ([place])   Assessment:   This is a routine wellness examination for [name] [name].  Exercise Activities and Dietary recommendations Current Exercise Habits: The patient does not participate in regular exercise at present  Goals    . Exercise 3x per week (30 min per time)     Paitient would like to start silver sneakers     . Increase water intake     Starting 12/10/16, I will attempt to  increase my water intake from 2-16 oz bottles of water per day to 5-16 oz. Bottles of water.        Fall Risk Fall Risk  01/16/2019 11/27/2018 04/21/2018 01/11/2018 01/11/2018  Falls in the past year? 0 0 No No No  Number falls in past yr: 0 0 - - -  Injury with Fall? 0 0 - - -  Risk for fall due to : - - - - -   Is the patient's home free of loose throw rugs in walkways, pet beds, electrical cords, etc?   no      Grab bars in the bathroom? no      Handrails on the stairs?   no      Adequate lighting?   yes  Depression Screen PHQ 2/9 Scores 01/16/2019 01/11/2018 01/11/2018 12/10/2016  PHQ - 2 Score 0 0 0 0    Cognitive Function MMSE - Mini Mental State Exam 01/11/2018 12/10/2016  Not  completed: - Unable to complete  Orientation to time 4 -  Orientation to Place 5 -  Registration 3 -  Attention/ Calculation 0 -  Recall 3 -  Language- name 2 objects 2 -  Language- repeat 1 -  Language- follow 3 step command 3 -  Language- read & follow direction 1 -  Write a sentence 1 -  Copy design 0 -  Total score 23 -        Immunization History  Administered Date(s) Administered  . Influenza,inj,Quad PF,6+ Mos 08/10/2015, 09/22/2016, 07/29/2017, 11/27/2018  . Pneumococcal Polysaccharide-23 12/10/2016    Qualifies for Shingles Vaccine? Declined   Screening Tests Health Maintenance  Topic Date Due  . OPHTHALMOLOGY EXAM  11/09/1968  . TETANUS/TDAP  11/09/1977  . HEMOGLOBIN A1C  10/21/2018  . FOOT EXAM  01/12/2019  . COLONOSCOPY  03/24/2023  . INFLUENZA VACCINE  Completed  . PNEUMOCOCCAL POLYSACCHARIDE VACCINE AGE 21-64 HIGH RISK  Completed  . Hepatitis C Screening  Completed  . HIV Screening  Completed   Cancer Screenings: Lung: Low Dose CT Chest recommended if Age 67-80 years, 30 pack-year currently smoking OR have quit w/in 15years. Patient does qualify. Colorectal:up to date due 03/24/2023   Additional Screenings:  Hepatitis C Screening:completed       Plan:  - smoking cessation  - low carbohydrate,low saturated fats,high vegetable diet and exercise as tolerated  - Low dose CT scan screening for cancer  - Tdap vaccine  - Prevnar 13   I have personally reviewed and noted the following in the patient's chart:   . Medical and social history . Use of alcohol, tobacco or illicit drugs  . Current medications and supplements . Functional ability and status . Nutritional status . Physical activity . Advanced directives . List of other physicians . Hospitalizations, surgeries, and ER visits in previous 12 months . Vitals . Screenings to include cognitive, depression, and falls . Referrals and appointments  In addition, I have reviewed and discussed with  patient certain preventive protocols, quality metrics, and best practice recommendations. A written personalized care plan for preventive services as well as general preventive health recommendations were provided to patient.    Sandrea Hughs, NP  01/16/2019

## 2019-01-16 NOTE — Telephone Encounter (Signed)
Will hold on refill for now patient had blood work today then will determine whether to start on another medication

## 2019-01-16 NOTE — Telephone Encounter (Signed)
[name] [name] with [facility] [address] stated that medication Fenofibrate 43mg  is NOT on patient's Formulary, Insurance will not cover.  I asked [name] [name] what they will cover and he stated he tried calling the insurance company but they gave him the run around and did not state what would be covered. He said to just try another drug or a different strength. Please Advise.

## 2019-01-17 ENCOUNTER — Telehealth: Payer: Self-pay

## 2019-01-17 DIAGNOSIS — I10 Essential (primary) hypertension: Secondary | ICD-10-CM

## 2019-01-17 DIAGNOSIS — E1149 Type 2 diabetes mellitus with other diabetic neurological complication: Secondary | ICD-10-CM

## 2019-01-17 DIAGNOSIS — E785 Hyperlipidemia, unspecified: Secondary | ICD-10-CM

## 2019-01-17 DIAGNOSIS — E1169 Type 2 diabetes mellitus with other specified complication: Secondary | ICD-10-CM

## 2019-01-17 LAB — COMPLETE METABOLIC PANEL WITH GFR
AG Ratio: 1.4 (calc) (ref 1.0–2.5)
ALBUMIN MSPROF: 4.3 g/dL (ref 3.6–5.1)
ALT: 24 U/L (ref 9–46)
AST: 24 U/L (ref 10–35)
Alkaline phosphatase (APISO): 92 U/L (ref 35–144)
BUN/Creatinine Ratio: 13 (calc) (ref 6–22)
BUN: 21 mg/dL (ref 7–25)
CO2: 24 mmol/L (ref 20–32)
Calcium: 9.7 mg/dL (ref 8.6–10.3)
Chloride: 104 mmol/L (ref 98–110)
Creat: 1.59 mg/dL — ABNORMAL HIGH (ref 0.70–1.25)
GFR, Est African American: 54 mL/min/{1.73_m2} — ABNORMAL LOW (ref >=60)
GFR, Est Non African American: 46 mL/min/{1.73_m2} — ABNORMAL LOW (ref >=60)
Globulin: 3.1 g/dL (calc) (ref 1.9–3.7)
Glucose, Bld: 109 mg/dL (ref 65–139)
Potassium: 4.4 mmol/L (ref 3.5–5.3)
Sodium: 139 mmol/L (ref 135–146)
Total Bilirubin: 0.4 mg/dL (ref 0.2–1.2)
Total Protein: 7.4 g/dL (ref 6.1–8.1)

## 2019-01-17 LAB — TSH: TSH: 3.61 mIU/L (ref 0.40–4.50)

## 2019-01-17 LAB — CBC WITH DIFFERENTIAL/PLATELET
Absolute Monocytes: 396 cells/uL (ref 200–950)
Basophils Absolute: 41 cells/uL (ref 0–200)
Basophils Relative: 1.1 %
EOS ABS: 118 {cells}/uL (ref 15–500)
Eosinophils Relative: 3.2 %
HCT: 42.3 % (ref 38.5–50.0)
Hemoglobin: 14.3 g/dL (ref 13.2–17.1)
Lymphs Abs: 1598 cells/uL (ref 850–3900)
MCH: 29.1 pg (ref 27.0–33.0)
MCHC: 33.8 g/dL (ref 32.0–36.0)
MCV: 86.2 fL (ref 80.0–100.0)
MPV: 13.7 fL — ABNORMAL HIGH (ref 7.5–12.5)
Monocytes Relative: 10.7 %
Neutro Abs: 1547 cells/uL (ref 1500–7800)
Neutrophils Relative %: 41.8 %
Platelets: 136 10*3/uL — ABNORMAL LOW (ref 140–400)
RBC: 4.91 10*6/uL (ref 4.20–5.80)
RDW: 14.4 % (ref 11.0–15.0)
TOTAL LYMPHOCYTE: 43.2 %
WBC: 3.7 10*3/uL — ABNORMAL LOW (ref 3.8–10.8)

## 2019-01-17 LAB — HEMOGLOBIN A1C
Hgb A1c MFr Bld: 6.3 % of total Hgb — ABNORMAL HIGH (ref <5.7)
MEAN PLASMA GLUCOSE: 134 (calc)
eAG (mmol/L): 7.4 (calc)

## 2019-01-17 LAB — LIPID PANEL
CHOLESTEROL: 247 mg/dL — AB (ref <200)
HDL: 45 mg/dL (ref >=40)
LDL Cholesterol (Calc): 167 mg/dL (calc) — ABNORMAL HIGH
Non-HDL Cholesterol (Calc): 202 mg/dL (calc) — ABNORMAL HIGH (ref <130)
Total CHOL/HDL Ratio: 5.5 (calc) — ABNORMAL HIGH (ref <5.0)
Triglycerides: 193 mg/dL — ABNORMAL HIGH (ref <150)

## 2019-01-17 MED ORDER — ATORVASTATIN CALCIUM 80 MG PO TABS: 80.0000 mg | ORAL_TABLET | Freq: Every day | ORAL | 0 refills | Status: DC

## 2019-01-17 NOTE — Telephone Encounter (Signed)
-----   Message from Sandrea Hughs, NP sent at 01/17/2019  9:03 AM EDT ----- 1. TSH level within normal range 2. Hgb A1C slightly higher than previous level consistent with prediabetes. 3. Total cholesterol ,Triglycerides and LDL are high.Adjust medications:  - increase atorvastatin 80 mg tablet to one by mouth daily. - Add Omega 3 fatty acid 2 Gm capsule take one capsule by mouth twice daily for high Triglycerides.Discontinue fenofibrate 43 mg capsule not covered by patient's insurance. - Recommend low carbohydrates,low saturated fats and high vegetable diet and increase physical activity as tolerated.   4. Electrolytes and liver function within normal limit.Renal function slightly worsen compared to previous labs. 5. CBC shows slightly low WBC,no signs of infection or anemia.Platelets level still low but has improved from previous level. Increase fruits in diet to boost immunity due to low WBC. 6.Recheck CBC/diff,CMP,TSH, Hgb A1C and Lipid panel in 3 months

## 2019-01-17 NOTE — Telephone Encounter (Addendum)
Called and discussed results with patient. Results also sent to be mailed to patient.  3 mo lab recheck scheduled.  Atorvastatin 80 mg increase sent to pharm.

## 2019-02-07 ENCOUNTER — Other Ambulatory Visit: Payer: Self-pay | Admitting: Family

## 2019-02-07 DIAGNOSIS — I10 Essential (primary) hypertension: Secondary | ICD-10-CM

## 2019-02-22 ENCOUNTER — Ambulatory Visit (INDEPENDENT_AMBULATORY_CARE_PROVIDER_SITE_OTHER): Payer: Medicare HMO | Admitting: Nurse Practitioner

## 2019-02-22 ENCOUNTER — Encounter: Payer: Self-pay | Admitting: Nurse Practitioner

## 2019-02-22 ENCOUNTER — Other Ambulatory Visit: Payer: Self-pay

## 2019-02-22 DIAGNOSIS — I69354 Hemiplegia and hemiparesis following cerebral infarction affecting left non-dominant side: Secondary | ICD-10-CM | POA: Diagnosis not present

## 2019-02-22 DIAGNOSIS — I10 Essential (primary) hypertension: Secondary | ICD-10-CM | POA: Diagnosis not present

## 2019-02-22 DIAGNOSIS — F325 Major depressive disorder, single episode, in full remission: Secondary | ICD-10-CM

## 2019-02-22 DIAGNOSIS — F0150 Vascular dementia without behavioral disturbance: Secondary | ICD-10-CM

## 2019-02-22 DIAGNOSIS — E785 Hyperlipidemia, unspecified: Secondary | ICD-10-CM | POA: Diagnosis not present

## 2019-02-22 DIAGNOSIS — Z720 Tobacco use: Secondary | ICD-10-CM | POA: Diagnosis not present

## 2019-02-22 DIAGNOSIS — R7303 Prediabetes: Secondary | ICD-10-CM | POA: Diagnosis not present

## 2019-02-22 NOTE — Progress Notes (Signed)
This service is provided via telemedicine  No vital signs collected/recorded due to the encounter was a telemedicine visit.   Location of patient (ex: home, work): Home   Patient consents to a telephone visit: Yes  Location of the provider (ex: office, home):  Office  Name of any referring provider:  Marlowe Sax, NP  Names of all persons participating in the telemedicine service and their role in the encounter:  Ruthell Rummage CMA, Liston Alba  NP, and Loleta Books.   Time spent on call:  Ruthell Rummage CMA spent 4   Minutes with patient on phone    Virtual Visit via Telephone Note  I connected with [name]. on 02/22/19 at  2:15 PM EDT by telephone and verified that I am speaking with the correct person using two identifiers.   I discussed the limitations, risks, security and privacy concerns of performing an evaluation and management service by telephone and the availability of in person appointments. I also discussed with the patient that there may be a patient responsible charge related to this service. The patient expressed understanding and agreed to proceed.      Careteam: Patient Care Team: Ngetich, Nelda Bucks, NP as PCP - General (Family Medicine) Center, [place] ([place])  Advanced Directive information    No Known Allergies  Chief Complaint  Patient presents with  . Medical Management of Chronic Issues    3 month follow up      HPI: Patient is a 58 y.o. male to follow up on chronic conditions.   Hypertension - reports he check blood pressure at home but unsure what the readings are, he is unaware what medications he takes, states his sister places his medications in a pill box for him. currently taking Clonidine, Hydralazine and amlodipine per medication list.   Depression- on Prozac 10 mg capsule daily. Reports depression is in remission at this time.    Dyslipidemia - LDL not controlled, recently with  increase in atorvastatin to 80 mg tablet daily, omega 3s recently added due to elevated triglycerides.   Type 2 DM- currently diet controlled. Recent A1c 6.3  Vascular Dementia- hx of CVA- stopped aricept, states "hes good"    Hx of stroke with left hemiplegia- left hand paralysis and pain. Reports pain to left side but controlled with hydrocodone-apap, using about once daily.    Review of Systems:  Review of Systems  Constitutional: Negative for chills, fever and malaise/fatigue.  Respiratory: Negative for cough and shortness of breath.   Cardiovascular: Negative for chest pain and leg swelling.  Gastrointestinal: Negative for constipation, diarrhea and heartburn.  Genitourinary: Negative for dysuria, frequency and urgency.  Musculoskeletal: Positive for myalgias (on left due to stroke).  Neurological: Negative for dizziness, speech change and headaches.  Psychiatric/Behavioral: Positive for memory loss. Negative for depression. The patient does not have insomnia.    Past Medical History:  Diagnosis Date  . Acute respiratory failure ([place])   . Cerebral edema (HCC)   . Diabetes mellitus without complication (HCC)    diet controlled- no meds  . Dysphagia as late effect of cerebrovascular disease   . Fall at home   . Hemiparesis affecting left side as late effect of stroke ([place])   . Hyperlipidemia   . Hypertension   . Stroke ([place]) 2016  . Tuberculosis    11 yrs ago per pt.- treated with meds per pt.   Past Surgical History:  Procedure Laterality Date  .  COLONOSCOPY  10/06/2016  . ESOPHAGOGASTRODUODENOSCOPY N/A 08/12/2015   Procedure: ESOPHAGOGASTRODUODENOSCOPY (EGD);  Surgeon: Ladene Artist, MD;  Location: [facility] ENDOSCOPY;  Service: Endoscopy;  Laterality: N/A;  . feeding tube removed    . GASTROSTOMY TUBE PLACEMENT  05-19-15  . mechanical thrombectomy angioplasty stenet placement    . SKIN GRAFT  1980's   ankle  . UPPER GASTROINTESTINAL ENDOSCOPY     Social History:    reports that he has been smoking cigarettes. He has been smoking about 0.50 packs per day. He has never used smokeless tobacco. He reports that he does not drink alcohol or use drugs.  Family History  Problem Relation Age of Onset  . Alcohol abuse Sister   . Alcohol abuse Brother   . Alcohol abuse Father   . Colon cancer Neg Hx   . Colon polyps Neg Hx   . Rectal cancer Neg Hx   . Stomach cancer Neg Hx   . Esophageal cancer Neg Hx     Medications: Patient's Medications  New Prescriptions   No medications on file  Previous Medications   AMLODIPINE (NORVASC) 10 MG TABLET    Take 1 tablet (10 mg total) by mouth daily.   ASPIRIN 325 MG TABLET    Take 1 tablet (325 mg total) by mouth daily.   ATORVASTATIN (LIPITOR) 80 MG TABLET    Take 1 tablet (80 mg total) by mouth daily.   CLONIDINE (CATAPRES) 0.1 MG TABLET    TAKE 1 TABLET BY MOUTH EVERY 8 HOURS AS NEEDED FOR HYPERTENSION   FLUOXETINE (PROZAC) 10 MG CAPSULE    TAKE 1 CAPSULE BY MOUTH EVERY DAY   HYDRALAZINE (APRESOLINE) 10 MG TABLET    TAKE 1 TABLET BY MOUTH EVERY 8 HOURS FOR ESSENTIAL HYPERTENSION   HYDROCODONE-ACETAMINOPHEN (NORCO) 10-325 MG TABLET    Take 1 tablet by mouth every 6 (six) hours as needed.   OMEGA-3 FATTY ACIDS PO    Take by mouth. 2 Gm capsule take one capsule by mouth twice daily   PANTOPRAZOLE (PROTONIX) 40 MG TABLET    Take 1 tablet (40 mg total) by mouth 2 (two) times daily.  Modified Medications   No medications on file  Discontinued Medications   No medications on file     Physical Exam: unable due to tele-visit   Labs reviewed: Basic Metabolic Panel: Recent Labs    04/21/18 1113 01/16/19 1404  NA 140 139  K 4.3 4.4  CL 104 104  CO2 30 24  GLUCOSE 72 109  BUN 17 21  CREATININE 1.20 1.59*  CALCIUM 9.5 9.7  TSH  --  3.61   Liver Function Tests: Recent Labs    04/21/18 1113 01/16/19 1404  AST 36* 24  ALT 52* 24  BILITOT 0.4 0.4  PROT 7.7 7.4   No results for input(s): LIPASE, AMYLASE  in the last 8760 hours. No results for input(s): AMMONIA in the last 8760 hours. CBC: Recent Labs    01/16/19 1404  WBC 3.7*  NEUTROABS 1,547  HGB 14.3  HCT 42.3  MCV 86.2  PLT 136*   Lipid Panel: Recent Labs    04/21/18 1113 01/16/19 1404  CHOL 135 247*  HDL 42 45  LDLCALC 66 167*  TRIG 209* 193*  CHOLHDL 3.2 5.5*   TSH: Recent Labs    01/16/19 1404  TSH 3.61   A1C: Lab Results  Component Value Date   HGBA1C 6.3 (H) 01/16/2019     Assessment/Plan 1. Vascular dementia  without behavioral disturbance (HCC) Stable, pt reports there is not an issue with memory and has stopped Aricept.   2. Dyslipidemia Not at goal on last labs, lipitor was increased and omega 3 was added. Discussed diet modifications as well. LDL goal <70 for stroke risk reduction. To work on diet modifications as well.   3. Tobacco abuse Encouraged cessation.   4. Prediabetes Diet controlled. A1c at goal. Continue dietary modifications.   5. Essential hypertension -unable to give blood pressure readings, report he will have his sister help him to record blood pressure readings and call office. Goal <140/90. Will continue current regimen.   6. Hemiparesis affecting left side as late effect of stroke (HCC) Stable, continues on ASA 325 mg daily with hydrocodone-apap that controls pain.   7. Depression, major, single episode, complete remission (HCC) Stable on fluoxetine.   Next appt: 04/19/2019 for lab and follow up with Dinah NP after.  Carlos American. Harle Battiest  [facility] [phone]    Follow Up Instructions:    I discussed the assessment and treatment plan with the patient. The patient was provided an opportunity to ask questions and all were answered. The patient agreed with the plan and demonstrated an understanding of the instructions.   The patient was advised to call back or seek an in-person evaluation if the symptoms worsen or if the condition  fails to improve as anticipated.  I provided 12 minutes of non-face-to-face time during this encounter.   Sherrie Mustache NP

## 2019-02-22 NOTE — Patient Instructions (Addendum)
Please take blood pressure several times over about a weeks time and call us with your readings To work on diet modifications   To work on cutting back on smoking  Make sure you are taking your medications as prescribed   DASH Eating Plan DASH stands for "Dietary Approaches to Stop Hypertension." The DASH eating plan is a healthy eating plan that has been shown to reduce high blood pressure (hypertension). It may also reduce your risk for type 2 diabetes, heart disease, and stroke. The DASH eating plan may also help with weight loss. What are tips for following this plan?  General guidelines  Avoid eating more than 2,300 mg (milligrams) of salt (sodium) a day. If you have hypertension, you may need to reduce your sodium intake to 1,500 mg a day.  Limit alcohol intake to no more than 1 drink a day for nonpregnant women and 2 drinks a day for men. One drink equals 12 oz of beer, 5 oz of wine, or 1 oz of hard liquor.  Work with your health care provider to maintain a healthy body weight or to lose weight. Ask what an ideal weight is for you.  Get at least 30 minutes of exercise that causes your heart to beat faster (aerobic exercise) most days of the week. Activities may include walking, swimming, or biking.  Work with your health care provider or diet and nutrition specialist (dietitian) to adjust your eating plan to your individual calorie needs. Reading food labels   Check food labels for the amount of sodium per serving. Choose foods with less than 5 percent of the Daily Value of sodium. Generally, foods with less than 300 mg of sodium per serving fit into this eating plan.  To find whole grains, look for the word "whole" as the first word in the ingredient list. Shopping  Buy products labeled as "low-sodium" or "no salt added."  Buy fresh foods. Avoid canned foods and premade or frozen meals. Cooking  Avoid adding salt when cooking. Use salt-free seasonings or herbs instead of  table salt or sea salt. Check with your health care provider or pharmacist before using salt substitutes.  Do not fry foods. Cook foods using healthy methods such as baking, boiling, grilling, and broiling instead.  Cook with heart-healthy oils, such as olive, canola, soybean, or sunflower oil. Meal planning  Eat a balanced diet that includes: ? 5 or more servings of fruits and vegetables each day. At each meal, try to fill half of your plate with fruits and vegetables. ? Up to 6-8 servings of whole grains each day. ? Less than 6 oz of lean meat, poultry, or fish each day. A 3-oz serving of meat is about the same size as a deck of cards. One egg equals 1 oz. ? 2 servings of low-fat dairy each day. ? A serving of nuts, seeds, or beans 5 times each week. ? Heart-healthy fats. Healthy fats called Omega-3 fatty acids are found in foods such as flaxseeds and coldwater fish, like sardines, salmon, and mackerel.  Limit how much you eat of the following: ? Canned or prepackaged foods. ? Food that is high in trans fat, such as fried foods. ? Food that is high in saturated fat, such as fatty meat. ? Sweets, desserts, sugary drinks, and other foods with added sugar. ? Full-fat dairy products.  Do not salt foods before eating.  Try to eat at least 2 vegetarian meals each week.  Eat more home-cooked food and less  restaurant, buffet, and fast food.  When eating at a restaurant, ask that your food be prepared with less salt or no salt, if possible. What foods are recommended? The items listed may not be a complete list. Talk with your dietitian about what dietary choices are best for you. Grains Whole-grain or whole-wheat bread. Whole-grain or whole-wheat pasta. Brown rice. [place]. Bulgur. Whole-grain and low-sodium cereals. Pita bread. Low-fat, low-sodium crackers. Whole-wheat flour tortillas. Vegetables Fresh or frozen vegetables (raw, steamed, roasted, or grilled). Low-sodium or  reduced-sodium tomato and vegetable juice. Low-sodium or reduced-sodium tomato sauce and tomato paste. Low-sodium or reduced-sodium canned vegetables. Fruits All fresh, dried, or frozen fruit. Canned fruit in natural juice (without added sugar). Meat and other protein foods Skinless chicken or [place]. Ground chicken or [place]. Pork with fat trimmed off. Fish and seafood. Egg whites. Dried beans, peas, or lentils. Unsalted nuts, nut butters, and seeds. Unsalted canned beans. Lean cuts of beef with fat trimmed off. Low-sodium, lean deli meat. Dairy Low-fat (1%) or fat-free (skim) milk. Fat-free, low-fat, or reduced-fat cheeses. Nonfat, low-sodium ricotta or cottage cheese. Low-fat or nonfat yogurt. Low-fat, low-sodium cheese. Fats and oils Soft margarine without trans fats. Vegetable oil. Low-fat, reduced-fat, or light mayonnaise and salad dressings (reduced-sodium). Canola, safflower, olive, soybean, and sunflower oils. Avocado. Seasoning and other foods Herbs. Spices. Seasoning mixes without salt. Unsalted popcorn and pretzels. Fat-free sweets. What foods are not recommended? The items listed may not be a complete list. Talk with your dietitian about what dietary choices are best for you. Grains Baked goods made with fat, such as croissants, muffins, or some breads. Dry pasta or rice meal packs. Vegetables Creamed or fried vegetables. Vegetables in a cheese sauce. Regular canned vegetables (not low-sodium or reduced-sodium). Regular canned tomato sauce and paste (not low-sodium or reduced-sodium). Regular tomato and vegetable juice (not low-sodium or reduced-sodium). Angie Fava. Olives. Fruits Canned fruit in a light or heavy syrup. Fried fruit. Fruit in cream or butter sauce. Meat and other protein foods Fatty cuts of meat. Ribs. Fried meat. Berniece Salines. Sausage. Bologna and other processed lunch meats. Salami. Fatback. Hotdogs. Bratwurst. Salted nuts and seeds. Canned beans with added salt. Canned or  smoked fish. Whole eggs or egg yolks. Chicken or [place] with skin. Dairy Whole or 2% milk, cream, and half-and-half. Whole or full-fat cream cheese. Whole-fat or sweetened yogurt. Full-fat cheese. Nondairy creamers. Whipped toppings. Processed cheese and cheese spreads. Fats and oils Butter. Stick margarine. Lard. Shortening. Ghee. Bacon fat. Tropical oils, such as coconut, palm kernel, or palm oil. Seasoning and other foods Salted popcorn and pretzels. Onion salt, garlic salt, seasoned salt, table salt, and sea salt. Worcestershire sauce. Tartar sauce. Barbecue sauce. Teriyaki sauce. Soy sauce, including reduced-sodium. Steak sauce. Canned and packaged gravies. Fish sauce. Oyster sauce. Cocktail sauce. Horseradish that you find on the shelf. Ketchup. Mustard. Meat flavorings and tenderizers. Bouillon cubes. Hot sauce and Tabasco sauce. Premade or packaged marinades. Premade or packaged taco seasonings. Relishes. Regular salad dressings. Where to find more information:  National Heart, Lung, and [place]: https://wilson-eaton.com/  American Heart Association: www.heart.org Summary  The DASH eating plan is a healthy eating plan that has been shown to reduce high blood pressure (hypertension). It may also reduce your risk for type 2 diabetes, heart disease, and stroke.  With the DASH eating plan, you should limit salt (sodium) intake to 2,300 mg a day. If you have hypertension, you may need to reduce your sodium intake to 1,500 mg a day.  When on the DASH eating plan, aim to eat more fresh fruits and vegetables, whole grains, lean proteins, low-fat dairy, and heart-healthy fats.  Work with your health care provider or diet and nutrition specialist (dietitian) to adjust your eating plan to your individual calorie needs. This information is not intended to replace advice given to you by your health care provider. Make sure you discuss any questions you have with your health care provider. Document  Released: 10/14/2011 Document Revised: 10/18/2016 Document Reviewed: 10/18/2016 Elsevier Interactive Patient Education  2019 [employer].

## 2019-02-26 ENCOUNTER — Ambulatory Visit: Payer: Medicare HMO | Admitting: Nurse Practitioner

## 2019-03-09 ENCOUNTER — Other Ambulatory Visit: Payer: Self-pay | Admitting: Nurse Practitioner

## 2019-03-09 DIAGNOSIS — I10 Essential (primary) hypertension: Secondary | ICD-10-CM

## 2019-04-08 ENCOUNTER — Other Ambulatory Visit: Payer: Self-pay | Admitting: Family

## 2019-04-08 DIAGNOSIS — E1169 Type 2 diabetes mellitus with other specified complication: Secondary | ICD-10-CM

## 2019-04-08 DIAGNOSIS — E1149 Type 2 diabetes mellitus with other diabetic neurological complication: Secondary | ICD-10-CM

## 2019-04-08 DIAGNOSIS — I10 Essential (primary) hypertension: Secondary | ICD-10-CM

## 2019-04-19 ENCOUNTER — Other Ambulatory Visit: Payer: Self-pay

## 2019-04-24 ENCOUNTER — Encounter: Payer: Self-pay | Admitting: Family

## 2019-04-24 ENCOUNTER — Other Ambulatory Visit: Payer: Self-pay

## 2019-04-24 ENCOUNTER — Ambulatory Visit (INDEPENDENT_AMBULATORY_CARE_PROVIDER_SITE_OTHER): Payer: Medicare HMO | Admitting: Family

## 2019-04-24 VITALS — Wt 283.0 lb

## 2019-04-24 DIAGNOSIS — F329 Major depressive disorder, single episode, unspecified: Secondary | ICD-10-CM | POA: Diagnosis not present

## 2019-04-24 DIAGNOSIS — E785 Hyperlipidemia, unspecified: Secondary | ICD-10-CM | POA: Diagnosis not present

## 2019-04-24 DIAGNOSIS — I10 Essential (primary) hypertension: Secondary | ICD-10-CM

## 2019-04-24 DIAGNOSIS — R7303 Prediabetes: Secondary | ICD-10-CM | POA: Diagnosis not present

## 2019-04-24 DIAGNOSIS — F32A Depression, unspecified: Secondary | ICD-10-CM

## 2019-04-24 MED ORDER — HYDRALAZINE HCL 25 MG PO TABS: ORAL_TABLET | ORAL | 3 refills | Status: DC

## 2019-04-24 NOTE — Progress Notes (Signed)
This service is provided via telemedicine  No vital signs collected/recorded due to the encounter was a telemedicine visit.   Location of patient (ex: home, work): Home   Patient consents to a telephone visit:  Yes   Location of the provider (ex: office, home): Office    Names of all persons participating in the telemedicine service and their role in the encounter: Ruthell Rummage CMA, Amed Datta NP, and Loleta Books  Time spent on call:  Ruthell Rummage CMA  Spent 6 minutes on phone with patient   Provider: Dublin Cantero FNP-C   Khadir Roam, Nelda Bucks, NP  Patient Care Team: Danyetta Gillham, Nelda Bucks, NP as PCP - General ([place]) [place] ([place])  Extended Emergency Contact Information Primary Emergency Contact: [name],Nicole Address: [address]          [place], [place] [address] [name] Phone: [phone] Relation: Daughter Secondary Emergency Contact: Cupples,Kaicen  [place] Phone: [phone] Relation: Other   Goals of care: Advanced Directive information Advanced Directives 01/16/2019  Does Patient Have a Medical Advance Directive? No  Type of Advance Directive -  Does patient want to make changes to medical advance directive? -  Copy of [place] in Chart? -  Would patient like information on creating a medical advance directive? No - Patient declined     Chief Complaint  Patient presents with  . Medical Management of Chronic Issues    follow up     HPI:  Pt is a 58 y.o. male seen today  for medical management of chronic diseases.He denies any acute issues during visit.He states checks his blood pressure at home readings ranging in the 140's/70's - 150's/90's also has  Some readings in the 200'/100's.He is currently on amlodipine 10 mg tablet daily,clonidine 0.1mg  tablet every 8 hrs as needed and Hydralazine 10 mg tablet every 8 hours.He denies any  headache,dizziness,change in vision,faintness,chest pain or shortness of breath.Lab work ordered in previous visit but was not drawn prior to today's visit.Patient states sister who is the main care giver now works in the morning shift unable to bring him for visit.Patient will discuss with patient on transportation arrangement.  Dyslipidemia -currently on atorvastatin 40 mg tablet daily.latest chol 247,LDL 167,TRG 193 (01/16/19).discussed with patient eating a heart healthy diet such as low carbohydrate,low saturated fats.high vegetable diet and to increase physical activity/exercise at least three times per week for 30 minutes.     Prediabetes - lab reviewed latest Hgb A1C 6.3 (01/16/2019)The patient is asked to make an attempt to improve diet and exercise patterns to aid in medical management of this problem as above.   Depression - on Prozac 10 mg capsule daily.He denies any worsening signs of depression.    Past Medical History:  Diagnosis Date  . Acute respiratory failure ([place])   . Cerebral edema ([facility])   . Diabetes mellitus without complication ([facility])    diet controlled- no meds  . Dysphagia as late effect of cerebrovascular disease   . Fall at home   . Hemiparesis affecting left side as late effect of stroke ([place])   . Hyperlipidemia   . Hypertension   . Stroke ([place]) 2016  . Tuberculosis    11 yrs ago per pt.- treated with meds per pt.   Past Surgical History:  Procedure Laterality Date  . COLONOSCOPY  10/06/2016  . ESOPHAGOGASTRODUODENOSCOPY N/A 08/12/2015   Procedure: ESOPHAGOGASTRODUODENOSCOPY (EGD);  Surgeon: Norberto Sorenson  Sindy Guadeloupe, MD;  Location: [facility];  Service: Endoscopy;  Laterality: N/A;  . feeding tube removed    . GASTROSTOMY TUBE PLACEMENT  05-19-15  . mechanical thrombectomy angioplasty stenet placement    . SKIN GRAFT  1980's   ankle  . UPPER GASTROINTESTINAL ENDOSCOPY      No Known Allergies  Allergies as of 04/24/2019   No Known Allergies     Medication  List       Accurate as of April 24, 2019  9:35 AM. If you have any questions, ask your nurse or doctor.        amLODipine 10 MG tablet Commonly known as: NORVASC Take 1 tablet (10 mg total) by mouth daily.   aspirin 325 MG tablet Take 1 tablet (325 mg total) by mouth daily.   atorvastatin 80 MG tablet Commonly known as: LIPITOR TAKE 0.5 TABLETS (40 MG TOTAL) BY MOUTH DAILY. NEED AN APPOINTMENT BEFORE ANYMORE FUTURE REFILLS.   cloNIDine 0.1 MG tablet Commonly known as: CATAPRES TAKE 1 TABLET BY MOUTH EVERY 8 HOURS AS NEEDED FOR HYPERTENSION   FLUoxetine 10 MG capsule Commonly known as: PROZAC TAKE 1 CAPSULE BY MOUTH EVERY DAY   hydrALAZINE 10 MG tablet Commonly known as: APRESOLINE TAKE 1 TABLET BY MOUTH EVERY 8 HOURS FOR ESSENTIAL HYPERTENSION   HYDROcodone-acetaminophen 10-325 MG tablet Commonly known as: NORCO Take 1 tablet by mouth every 6 (six) hours as needed.   OMEGA-3 FATTY ACIDS PO Take by mouth. 2 Gm capsule take one capsule by mouth twice daily   pantoprazole 40 MG tablet Commonly known as: PROTONIX Take 1 tablet (40 mg total) by mouth 2 (two) times daily.       Review of Systems  Constitutional: Negative for appetite change, chills, fatigue and fever.  HENT: Negative for congestion, rhinorrhea, sinus pressure, sinus pain, sneezing, sore throat and trouble swallowing.   Eyes: Negative for pain, discharge, redness and itching.       Wears reading glasses   Respiratory: Negative for cough, chest tightness, shortness of breath and wheezing.   Cardiovascular: Negative for chest pain, palpitations and leg swelling.  Gastrointestinal: Negative for abdominal distention, abdominal pain, constipation, diarrhea, nausea and vomiting.  Endocrine: Negative for cold intolerance, heat intolerance, polydipsia, polyphagia and polyuria.  Genitourinary: Negative for difficulty urinating, dysuria, flank pain, frequency, hematuria and urgency.  Musculoskeletal: Positive for  arthralgias and gait problem.       Left arm   Skin: Negative for color change, pallor and rash.  Neurological: Positive for weakness. Negative for dizziness, light-headedness and headaches.       Left arm numbness/tingling   Hematological: Does not bruise/bleed easily.  Psychiatric/Behavioral: Negative for agitation, confusion and sleep disturbance. The patient is not nervous/anxious.     Immunization History  Administered Date(s) Administered  . Influenza,inj,Quad PF,6+ Mos 08/10/2015, 09/22/2016, 07/29/2017, 11/27/2018  . Pneumococcal Conjugate-13 01/16/2019  . Pneumococcal Polysaccharide-23 12/10/2016  . Tdap 01/16/2019   Pertinent  Health Maintenance Due  Topic Date Due  . OPHTHALMOLOGY EXAM  11/09/1968  . FOOT EXAM  01/12/2019  . INFLUENZA VACCINE  06/09/2019  . HEMOGLOBIN A1C  07/19/2019  . COLONOSCOPY  03/24/2023   Fall Risk  04/24/2019 02/22/2019 01/16/2019 11/27/2018 04/21/2018  Falls in the past year? 0 0 0 0 No  Number falls in past yr: 0 0 0 0 -  Injury with Fall? 0 0 0 0 -  Risk for fall due to : - - - - -  Vitals:   04/24/19 0924  Weight: 283 lb (128.4 kg)   Body mass index is 36.34 kg/m. Physical Exam Unable to complete on tele visit.   Labs reviewed: Recent Labs    01/16/19 1404  NA 139  K 4.4  CL 104  CO2 24  GLUCOSE 109  BUN 21  CREATININE 1.59*  CALCIUM 9.7   Recent Labs    01/16/19 1404  AST 24  ALT 24  BILITOT 0.4  PROT 7.4   Recent Labs    01/16/19 1404  WBC 3.7*  NEUTROABS 1,547  HGB 14.3  HCT 42.3  MCV 86.2  PLT 136*   Lab Results  Component Value Date   TSH 3.61 01/16/2019   Lab Results  Component Value Date   HGBA1C 6.3 (H) 01/16/2019   Lab Results  Component Value Date   CHOL 247 (H) 01/16/2019   HDL 45 01/16/2019   LDLCALC 167 (H) 01/16/2019   TRIG 193 (H) 01/16/2019   CHOLHDL 5.5 (H) 01/16/2019    Significant Diagnostic Results in last 30 days:  No results found.  Assessment/Plan 1. Essential  hypertension Reviewed blood pressure log with patient readings in the 140's/70's - 150's/90's with some readings 200/100's.Asymptomatic.continue on amlodipine 10 mg tablet daily,and clonidine 0.1 mg tablet PRN.Increase Hydralazine from 10 mg Q 8Hrs to 25 mg tablet Q 8 Hrs.encouraged to continue to monitor blood pressure and notify provider if SBP readings above > 150.  - hydrALAZINE (APRESOLINE) 25 MG tablet; TAKE 1 TABLET BY MOUTH EVERY 8 HOURS FOR ESSENTIAL HYPERTENSION  Dispense: 90 tablet; Refill: 3 CBC/diff,CMP pending.   2. Dyslipidemia Latest LDL not at goal.continue on Atorvastatin 40 mg tablet daily.schedule for lipid panel labs to be drawn.   3. Prediabetes Lab Results  Component Value Date   HGBA1C 6.3 (H) 01/16/2019  Advised to make an attempt to improve diet and exercise patterns to aid in medical management of this problem.Repeat Hgb A1C labs orders in place delayed due to patient's care giver work plan changes working morning shift previous worked evening shift was able to bring patient to visit.    4. Depression, unspecified depression type Mood stable.continue on Prozac 10 mg capsule daily.TSH level pending   Family/ staff Communication: Reviewed plan of care with patient and facility Nurse supervisor   Labs/tests ordered:CBC/diff,CMP,TSH level.Hgb A1C labs pending to be drawn once patient/sister makes transportation arrangement.   Time spent with patient 28 minutes >50% time spent counseling; reviewing medical record; tests; labs; and developing future plan of care  Sandrea Hughs, NP

## 2019-04-25 ENCOUNTER — Ambulatory Visit: Payer: Medicare HMO

## 2019-05-08 ENCOUNTER — Other Ambulatory Visit: Payer: Medicare HMO

## 2019-05-18 ENCOUNTER — Other Ambulatory Visit: Payer: Medicare HMO

## 2019-06-15 ENCOUNTER — Other Ambulatory Visit: Payer: Self-pay | Admitting: Family

## 2019-07-14 ENCOUNTER — Other Ambulatory Visit: Payer: Self-pay | Admitting: Family

## 2019-07-14 DIAGNOSIS — I10 Essential (primary) hypertension: Secondary | ICD-10-CM

## 2019-07-17 ENCOUNTER — Other Ambulatory Visit: Payer: Self-pay | Admitting: Family

## 2019-07-17 DIAGNOSIS — I10 Essential (primary) hypertension: Secondary | ICD-10-CM

## 2019-08-09 ENCOUNTER — Other Ambulatory Visit: Payer: Self-pay | Admitting: Family

## 2019-08-09 DIAGNOSIS — E1149 Type 2 diabetes mellitus with other diabetic neurological complication: Secondary | ICD-10-CM

## 2019-08-09 DIAGNOSIS — I10 Essential (primary) hypertension: Secondary | ICD-10-CM

## 2019-08-09 DIAGNOSIS — E1169 Type 2 diabetes mellitus with other specified complication: Secondary | ICD-10-CM

## 2019-08-28 ENCOUNTER — Ambulatory Visit (INDEPENDENT_AMBULATORY_CARE_PROVIDER_SITE_OTHER): Payer: Medicare HMO | Admitting: Family

## 2019-08-28 ENCOUNTER — Other Ambulatory Visit: Payer: Self-pay

## 2019-08-28 ENCOUNTER — Encounter: Payer: Self-pay | Admitting: Family

## 2019-08-28 VITALS — BP 138/80 | Wt 255.0 lb

## 2019-08-28 DIAGNOSIS — F17209 Nicotine dependence, unspecified, with unspecified nicotine-induced disorders: Secondary | ICD-10-CM | POA: Diagnosis not present

## 2019-08-28 DIAGNOSIS — E1149 Type 2 diabetes mellitus with other diabetic neurological complication: Secondary | ICD-10-CM | POA: Diagnosis not present

## 2019-08-28 DIAGNOSIS — F0150 Vascular dementia without behavioral disturbance: Secondary | ICD-10-CM

## 2019-08-28 DIAGNOSIS — E785 Hyperlipidemia, unspecified: Secondary | ICD-10-CM

## 2019-08-28 DIAGNOSIS — I6930 Unspecified sequelae of cerebral infarction: Secondary | ICD-10-CM

## 2019-08-28 DIAGNOSIS — I10 Essential (primary) hypertension: Secondary | ICD-10-CM | POA: Diagnosis not present

## 2019-08-28 DIAGNOSIS — K219 Gastro-esophageal reflux disease without esophagitis: Secondary | ICD-10-CM

## 2019-08-28 DIAGNOSIS — E1169 Type 2 diabetes mellitus with other specified complication: Secondary | ICD-10-CM

## 2019-08-28 MED ORDER — BUPROPION HCL ER (SR) 150 MG PO TB12: 150.0000 mg | ORAL_TABLET | Freq: Every day | ORAL | 0 refills | Status: DC

## 2019-08-28 NOTE — Progress Notes (Addendum)
This service is provided via telemedicine  No vital signs collected/recorded due to the encounter was a telemedicine visit.   Location of patient (ex: home, work):  Home   Patient consents to a telephone visit:  Yes  Location of the provider (ex: office, home):  Office   Name of any referring provider:    Names of all persons participating in the telemedicine service and their role in the encounter: Dinah Ngetich NP, Ruthell Rummage CMA, Loleta Books   Time spent on call:  Ruthell Rummage CMA, spent 10 minutes on phone with patient.    Location:      Place of Service:    Provider: Dinah Ngetich FNP-C   Ngetich, Nelda Bucks, NP  Patient Care Team: Ngetich, Nelda Bucks, NP as PCP - General ([place]) [facility], [place] ([place])  Extended Emergency Contact Information Primary Emergency Contact: [name],Nicole Address: [address]          [place], [place] [address] [name] Phone: [phone] Relation: Daughter Secondary Emergency Contact: Manon,Kishawn  [place] Phone: [phone] Relation: Other  Code Status: Full Code  Goals of care: Advanced Directive information Advanced Directives 01/16/2019  Does Patient Have a Medical Advance Directive? No  Type of Advance Directive -  Does patient want to make changes to medical advance directive? -  Copy of [place] in Chart? -  Would patient like information on creating a medical advance directive? No - Patient declined     Chief Complaint  Patient presents with  . Medical Management of Chronic Issues    4 month follow up     HPI:  Pt is a 58 y.o. male seen today for 4 month follow up for medical management of chronic diseases.He denies any acute issues.He states no longer takes Norco for pain states okay to take off from his medication list.   Hypertension - states blood pressure has been running in the 130's/80's.He denies any  headache,dizziness,chest pain,palpitation or shortness of breath.currently on amlodipine 10 mg tablet daily,clonidine 0.1 mg tablet daily and Hydralazine 25 mg tablet one very 8 hours.He takes ASA daily and statin.   Hyperlipidemia - on atorvastatin 40 mg tablet daily and Omega- 3 fatty acid 2 gm twice daily.states watches what he eats and walks to the mail box daily and back. Latest LDL reviewed not at goal.   GERD - on Protonix 40 mg tablet daily.states symptoms under control.     Depression - symptom under control on Prozac 10 mg capsule daily.reports no side effects.       Past Medical History:  Diagnosis Date  . Acute respiratory failure ([place])   . Cerebral edema (HCC)   . Diabetes mellitus without complication (HCC)    diet controlled- no meds  . Dysphagia as late effect of cerebrovascular disease   . Fall at home   . Hemiparesis affecting left side as late effect of stroke ([place])   . Hyperlipidemia   . Hypertension   . Stroke ([place]) 2016  . Tuberculosis    11 yrs ago per pt.- treated with meds per pt.   Past Surgical History:  Procedure Laterality Date  . COLONOSCOPY  10/06/2016  . ESOPHAGOGASTRODUODENOSCOPY N/A 08/12/2015   Procedure: ESOPHAGOGASTRODUODENOSCOPY (EGD);  Surgeon: Ladene Artist, MD;  Location: [facility] ENDOSCOPY;  Service: Endoscopy;  Laterality: N/A;  . feeding tube removed    . GASTROSTOMY TUBE PLACEMENT  05-19-15  . mechanical  thrombectomy angioplasty stenet placement    . SKIN GRAFT  1980's   ankle  . UPPER GASTROINTESTINAL ENDOSCOPY      No Known Allergies  Allergies as of 08/28/2019   No Known Allergies     Medication List       Accurate as of August 28, 2019  4:00 PM. If you have any questions, ask your nurse or doctor.        STOP taking these medications   HYDROcodone-acetaminophen 10-325 MG tablet Commonly known as: NORCO Stopped by: Sandrea Hughs, NP     TAKE these medications   amLODipine 10 MG tablet Commonly known as:  NORVASC TAKE 1 TABLET BY MOUTH EVERY DAY   aspirin 325 MG tablet Take 1 tablet (325 mg total) by mouth daily.   atorvastatin 80 MG tablet Commonly known as: LIPITOR Take 0.5 tablets (40 mg total) by mouth daily.   cloNIDine 0.1 MG tablet Commonly known as: CATAPRES TAKE 1 TABLET BY MOUTH EVERY 8 HOURS AS NEEDED FOR HYPERTENSION   FLUoxetine 10 MG capsule Commonly known as: PROZAC TAKE 1 CAPSULE BY MOUTH EVERY DAY   hydrALAZINE 25 MG tablet Commonly known as: APRESOLINE TAKE 1 TABLET BY MOUTH EVERY 8 HOURS FOR ESSENTIAL HYPERTENSION   OMEGA-3 FATTY ACIDS PO Take 2 Gm capsule  by mouth twice daily   pantoprazole 40 MG tablet Commonly known as: PROTONIX TAKE 1 TABLET BY MOUTH TWICE A DAY       Review of Systems  Constitutional: Negative for appetite change, chills, fatigue, fever and unexpected weight change.  HENT: Negative for congestion, ear pain, hearing loss, rhinorrhea, sinus pressure, sinus pain, sneezing and sore throat.   Eyes: Negative for discharge, redness and itching.  Respiratory: Negative for cough, chest tightness, shortness of breath and wheezing.   Cardiovascular: Negative for chest pain, palpitations and leg swelling.  Gastrointestinal: Negative for abdominal distention, abdominal pain, constipation, diarrhea, nausea and vomiting.  Endocrine: Negative for cold intolerance, heat intolerance, polydipsia, polyphagia and polyuria.  Genitourinary: Negative for difficulty urinating, dysuria, flank pain, frequency and urgency.  Musculoskeletal: Negative for arthralgias and back pain.  Skin: Negative for color change, pallor, rash and wound.  Neurological: Negative for dizziness, speech difficulty, light-headedness, numbness and headaches.       Left hand paralysis   Hematological: Does not bruise/bleed easily.  Psychiatric/Behavioral: Negative for agitation and sleep disturbance. The patient is not nervous/anxious.        Dementia     Immunization History   Administered Date(s) Administered  . Influenza,inj,Quad PF,6+ Mos 08/10/2015, 09/22/2016, 07/29/2017, 11/27/2018  . Pneumococcal Conjugate-13 01/16/2019  . Pneumococcal Polysaccharide-23 12/10/2016  . Tdap 01/16/2019   Pertinent  Health Maintenance Due  Topic Date Due  . OPHTHALMOLOGY EXAM  11/09/1968  . FOOT EXAM  01/12/2019  . INFLUENZA VACCINE  06/09/2019  . HEMOGLOBIN A1C  07/19/2019  . COLONOSCOPY  03/24/2023   Fall Risk  04/24/2019 02/22/2019 01/16/2019 11/27/2018 04/21/2018  Falls in the past year? 0 0 0 0 No  Number falls in past yr: 0 0 0 0 -  Injury with Fall? 0 0 0 0 -  Risk for fall due to : - - - - -    Vitals:   08/28/19 1425  Weight: 255 lb (115.7 kg)   Body mass index is 32.74 kg/m. Physical Exam  Unable to complete on telephone visit.   Labs reviewed: Recent Labs    01/16/19 1404  NA 139  K 4.4  CL  104  CO2 24  GLUCOSE 109  BUN 21  CREATININE 1.59*  CALCIUM 9.7   Recent Labs    01/16/19 1404  AST 24  ALT 24  BILITOT 0.4  PROT 7.4   Recent Labs    01/16/19 1404  WBC 3.7*  NEUTROABS 1,547  HGB 14.3  HCT 42.3  MCV 86.2  PLT 136*   Lab Results  Component Value Date   TSH 3.61 01/16/2019   Lab Results  Component Value Date   HGBA1C 6.3 (H) 01/16/2019   Lab Results  Component Value Date   CHOL 247 (H) 01/16/2019   HDL 45 01/16/2019   LDLCALC 167 (H) 01/16/2019   TRIG 193 (H) 01/16/2019   CHOLHDL 5.5 (H) 01/16/2019    Significant Diagnostic Results in last 30 days:  No results found.  Assessment/Plan  1. Essential hypertension Reports B/p at home readings in the 130's/80's.Continue on amlodipine 10 mg tablet daily,clonidine 0.1 mg tablet daily and Hydralazine 25 mg tablet one very 8 hours. ASA 325 mg tablet daily  And Statin for cardiovascular event prevention.    2. Type II diabetes mellitus with neurological manifestations [facility]) Lab Results  Component Value Date   HGBA1C 6.3 (H) 01/16/2019  Not any  medication.Encouraged dietary and life style modification.Has due lab work.  Up to date with his annual eye exam.Due for Influenza vaccine will arrange for appointment.  - refer to Podiatrist for diabetic annual foot examination.    3. Dyslipidemia associated with type 2 diabetes mellitus ([place]) Previous LDL not at goal.Has due lab work.continue on atorvastatin 40 mg tablet daily and Omega- 3 fatty acid 2 gm twice daily.Encouraged dietary and life style modification.  4. Gastroesophageal reflux disease without esophagitis Stable on Protonix 40 mg tablet daily. Encourage to avoid aggravating foods.   5. Vascular dementia without behavioral disturbance ([place]) No new behavioral issues.  6. H/O: stroke with residual effects Left hand paralysis.no longer has pain agree to remove Norco from medication list none in use.   7. Tobacco use disorder, continuous Continues to smoke 1 pack per 2 days.would like assistance to quit smoking.   - buPROPion (WELLBUTRIN SR) 150 MG 12 hr tablet; Take 1 tablet (150 mg total) by mouth daily.  Dispense: 30 tablet; Refill: 0 F/u in 1 month to reevaluate.Will increase BuPropion to 150 mg twice daily if needed on next visit.   Family/ staff Communication: Reviewed plan of care with patient.  Labs/tests ordered: Has pending labs to be draw.   Next appointment: 1 month for smoking cessation re-evaluation and fasting lab work.    Spent 25 minutes of non-face to face with patient    Sandrea Hughs, NP

## 2019-09-01 ENCOUNTER — Other Ambulatory Visit: Payer: Self-pay | Admitting: Family

## 2019-09-01 DIAGNOSIS — I10 Essential (primary) hypertension: Secondary | ICD-10-CM

## 2019-09-19 ENCOUNTER — Other Ambulatory Visit: Payer: Self-pay | Admitting: Family

## 2019-09-19 DIAGNOSIS — F17209 Nicotine dependence, unspecified, with unspecified nicotine-induced disorders: Secondary | ICD-10-CM

## 2019-09-19 NOTE — Telephone Encounter (Signed)
Pharmacy requesting diagnosis for medication refill. Routing to provider

## 2019-09-28 ENCOUNTER — Ambulatory Visit (INDEPENDENT_AMBULATORY_CARE_PROVIDER_SITE_OTHER): Payer: Medicare HMO | Admitting: Family

## 2019-09-28 ENCOUNTER — Other Ambulatory Visit: Payer: Self-pay

## 2019-09-28 ENCOUNTER — Encounter: Payer: Self-pay | Admitting: Family

## 2019-09-28 VITALS — BP 138/80 | HR 65 | Temp 98.7°F | Ht 74.0 in | Wt 255.0 lb

## 2019-09-28 DIAGNOSIS — F17209 Nicotine dependence, unspecified, with unspecified nicotine-induced disorders: Secondary | ICD-10-CM

## 2019-09-28 DIAGNOSIS — E1149 Type 2 diabetes mellitus with other diabetic neurological complication: Secondary | ICD-10-CM | POA: Diagnosis not present

## 2019-09-28 DIAGNOSIS — Z23 Encounter for immunization: Secondary | ICD-10-CM

## 2019-09-28 DIAGNOSIS — K219 Gastro-esophageal reflux disease without esophagitis: Secondary | ICD-10-CM | POA: Diagnosis not present

## 2019-09-28 DIAGNOSIS — E785 Hyperlipidemia, unspecified: Secondary | ICD-10-CM | POA: Diagnosis not present

## 2019-09-28 DIAGNOSIS — E1169 Type 2 diabetes mellitus with other specified complication: Secondary | ICD-10-CM | POA: Diagnosis not present

## 2019-09-28 DIAGNOSIS — R399 Unspecified symptoms and signs involving the genitourinary system: Secondary | ICD-10-CM | POA: Diagnosis not present

## 2019-09-28 DIAGNOSIS — I10 Essential (primary) hypertension: Secondary | ICD-10-CM | POA: Diagnosis not present

## 2019-09-28 MED ORDER — HYDRALAZINE HCL 25 MG PO TABS: 25.0000 mg | ORAL_TABLET | Freq: Three times a day (TID) | ORAL | 1 refills | Status: DC

## 2019-09-28 MED ORDER — CLONIDINE HCL 0.1 MG PO TABS: 0.1000 mg | ORAL_TABLET | Freq: Three times a day (TID) | ORAL | 1 refills | Status: DC | PRN

## 2019-09-28 MED ORDER — ATORVASTATIN CALCIUM 80 MG PO TABS: 40.0000 mg | ORAL_TABLET | Freq: Every day | ORAL | 1 refills | Status: DC

## 2019-09-28 MED ORDER — PANTOPRAZOLE SODIUM 40 MG PO TBEC: 40.0000 mg | DELAYED_RELEASE_TABLET | Freq: Two times a day (BID) | ORAL | 1 refills | Status: DC

## 2019-09-28 MED ORDER — AMLODIPINE BESYLATE 10 MG PO TABS: 10.0000 mg | ORAL_TABLET | Freq: Every day | ORAL | 1 refills | Status: DC

## 2019-09-28 MED ORDER — FLUOXETINE HCL 10 MG PO CAPS: 10.0000 mg | ORAL_CAPSULE | Freq: Every day | ORAL | 1 refills | Status: DC

## 2019-09-28 MED ORDER — BUPROPION HCL ER (SR) 150 MG PO TB12: 150.0000 mg | ORAL_TABLET | Freq: Every day | ORAL | 1 refills | Status: DC

## 2019-09-28 MED ORDER — BUPROPION HCL ER (SR) 150 MG PO TB12: 150.0000 mg | ORAL_TABLET | Freq: Two times a day (BID) | ORAL | 1 refills | Status: DC

## 2019-09-28 NOTE — Progress Notes (Signed)
Provider: Marlowe Sax FNP-C   Ngetich, Nelda Bucks, NP  Patient Care Team: Ngetich, Nelda Bucks, NP as PCP - General ([facility]) Center, [place] ([place])  Extended Emergency Contact Information Primary Emergency Contact: [name],Nicole Address: [address]          [place],  [address] [name] Phone: [phone] Relation: Daughter Secondary Emergency Contact: Zylstra,Betzalel  [place] Phone: [phone] Relation: Other  Code Status:  Full Code  Goals of care: Advanced Directive information Advanced Directives 01/16/2019  Does Patient Have a Medical Advance Directive? No  Type of Advance Directive -  Does patient want to make changes to medical advance directive? -  Copy of [place] in Chart? -  Would patient like information on creating a medical advance directive? No - Patient declined     Chief Complaint  Patient presents with  . Medical Management of Chronic Issues    1 month follow up  would like to discuss frequent trips to bathroom   . Quality Metric Gaps    patient would like flu vaccine     HPI:  Pt is a 58 y.o. male seen today for follow up smoking and need fasting lab work.he complains of increased urine frequency at night and during the day as well.He denies any dysuria,or lower abdominal pain but has urgency.Also denies any fever or chills.He states continues to smoke 1 pack every 2 days.  He is due for influenza vaccine today.  Also request medication refill.  Past Medical History:  Diagnosis Date  . Acute respiratory failure ([place])   . Cerebral edema (HCC)   . Diabetes mellitus without complication (HCC)    diet controlled- no meds  . Dysphagia as late effect of cerebrovascular disease   . Fall at home   . Hemiparesis affecting left side as late effect of stroke ([place])   . Hyperlipidemia   . Hypertension   . Stroke ([place]) 2016  . Tuberculosis    11 yrs  ago per pt.- treated with meds per pt.   Past Surgical History:  Procedure Laterality Date  . COLONOSCOPY  10/06/2016  . ESOPHAGOGASTRODUODENOSCOPY N/A 08/12/2015   Procedure: ESOPHAGOGASTRODUODENOSCOPY (EGD);  Surgeon: Ladene Artist, MD;  Location: [facility] ENDOSCOPY;  Service: Endoscopy;  Laterality: N/A;  . feeding tube removed    . GASTROSTOMY TUBE PLACEMENT  05-19-15  . mechanical thrombectomy angioplasty stenet placement    . SKIN GRAFT  1980's   ankle  . UPPER GASTROINTESTINAL ENDOSCOPY      No Known Allergies  Allergies as of 09/28/2019   No Known Allergies     Medication List       Accurate as of September 28, 2019  3:09 PM. If you have any questions, ask your nurse or doctor.        amLODipine 10 MG tablet Commonly known as: NORVASC Take 1 tablet (10 mg total) by mouth daily.   aspirin 325 MG tablet Take 1 tablet (325 mg total) by mouth daily.   atorvastatin 80 MG tablet Commonly known as: LIPITOR Take 0.5 tablets (40 mg total) by mouth daily.   buPROPion 150 MG 12 hr tablet Commonly known as: WELLBUTRIN SR Take 1 tablet (150 mg total) by mouth daily.   cloNIDine 0.1 MG tablet Commonly known as: CATAPRES Take 1 tablet (0.1 mg total) by mouth every 8 (eight) hours as needed. What changed: See the new instructions. Changed by: Nelda Bucks  Ngetich, NP   FLUoxetine 10 MG capsule Commonly known as: PROZAC Take 1 capsule (10 mg total) by mouth daily. What changed: how much to take Changed by: Sandrea Hughs, NP   hydrALAZINE 25 MG tablet Commonly known as: APRESOLINE Take 1 tablet (25 mg total) by mouth 3 (three) times daily. What changed: See the new instructions. Changed by: Sandrea Hughs, NP   OMEGA-3 FATTY ACIDS PO Take 2 Gm capsule  by mouth twice daily   pantoprazole 40 MG tablet Commonly known as: PROTONIX Take 1 tablet (40 mg total) by mouth 2 (two) times daily.       Review of Systems  Constitutional: Negative for appetite change, chills,  fatigue and fever.  HENT: Negative for congestion, rhinorrhea, sinus pressure, sinus pain, sneezing and sore throat.   Eyes: Negative for discharge, redness and itching.  Respiratory: Negative for cough, chest tightness, shortness of breath and wheezing.   Cardiovascular: Negative for chest pain, palpitations and leg swelling.  Gastrointestinal: Negative for abdominal distention, abdominal pain, constipation, diarrhea, nausea and vomiting.  Endocrine: Negative for cold intolerance, heat intolerance, polydipsia, polyphagia and polyuria.  Genitourinary: Positive for frequency and urgency. Negative for difficulty urinating, dysuria and flank pain.  Musculoskeletal: Positive for arthralgias and gait problem.  Skin: Negative for color change, pallor and rash.  Neurological: Positive for numbness. Negative for dizziness, light-headedness and headaches.       Left arm chronic numbness   Hematological: Does not bruise/bleed easily.  Psychiatric/Behavioral: Negative for agitation and sleep disturbance. The patient is not nervous/anxious.     Immunization History  Administered Date(s) Administered  . Influenza,inj,Quad PF,6+ Mos 08/10/2015, 09/22/2016, 07/29/2017, 11/27/2018, 09/28/2019  . Pneumococcal Conjugate-13 01/16/2019  . Pneumococcal Polysaccharide-23 12/10/2016  . Tdap 01/16/2019   Pertinent  Health Maintenance Due  Topic Date Due  . OPHTHALMOLOGY EXAM  11[birth year]09[birth year]1968  . FOOT EXAM  01/12/2019  . HEMOGLOBIN A1C  07/19/2019  . COLONOSCOPY  03/24/2023  . INFLUENZA VACCINE  Completed   Fall Risk  04/24/2019 02/22/2019 01/16/2019 11/27/2018 04/21/2018  Falls in the past year? 0 0 0 0 No  Number falls in past yr: 0 0 0 0 -  Injury with Fall? 0 0 0 0 -  Risk for fall due to : - - - - -    Vitals:   09/28/19 1433  BP: 138/80  Pulse: 65  Temp: 98.7 F (37.1 C)  TempSrc: Temporal  SpO2: 90%  Weight: 255 lb (115.7 kg)  Height: 6\' 2"  (1.88 m)   Body mass index is 32.74 kg/m. Physical  Exam Vitals signs reviewed.  Constitutional:      General: He is not in acute distress.    Appearance: He is obese. He is not ill-appearing.  HENT:     Head: Normocephalic.     Nose: Nose normal. No congestion or rhinorrhea.     Mouth/Throat:     Mouth: Mucous membranes are moist.     Pharynx: Oropharynx is clear. No oropharyngeal exudate or posterior oropharyngeal erythema.  Eyes:     General: No scleral icterus.       Right eye: No discharge.        Left eye: No discharge.     Extraocular Movements: Extraocular movements intact.     Conjunctiva/sclera: Conjunctivae normal.     Pupils: Pupils are equal, round, and reactive to light.  Neck:     Musculoskeletal: Normal range of motion. No neck rigidity or muscular tenderness.  Vascular: No carotid bruit.  Cardiovascular:     Rate and Rhythm: Normal rate and regular rhythm.     Pulses: Normal pulses.     Heart sounds: Normal heart sounds. No murmur. No friction rub. No gallop.   Pulmonary:     Effort: Pulmonary effort is normal. No respiratory distress.     Breath sounds: Normal breath sounds. No wheezing, rhonchi or rales.  Chest:     Chest wall: No tenderness.  Abdominal:     General: Bowel sounds are normal. There is no distension.     Palpations: Abdomen is soft. There is no mass.     Tenderness: There is no abdominal tenderness. There is no right CVA tenderness, left CVA tenderness, guarding or rebound.  Musculoskeletal:        General: No swelling or tenderness.     Right lower leg: No edema.     Left lower leg: No edema.     Comments: Left arm chronic weakness   Lymphadenopathy:     Cervical: No cervical adenopathy.  Skin:    General: Skin is warm and dry.     Coloration: Skin is not pale.     Findings: No bruising, erythema or rash.  Neurological:     Mental Status: He is alert and oriented to person, place, and time.     Cranial Nerves: No cranial nerve deficit.     Motor: No weakness.     Coordination:  Coordination normal.     Gait: Gait abnormal.  Psychiatric:        Mood and Affect: Mood normal.        Behavior: Behavior normal.        Thought Content: Thought content normal.        Judgment: Judgment normal.    Labs reviewed: Recent Labs    01/16/19 1404  NA 139  K 4.4  CL 104  CO2 24  GLUCOSE 109  BUN 21  CREATININE 1.59*  CALCIUM 9.7   Recent Labs    01/16/19 1404  AST 24  ALT 24  BILITOT 0.4  PROT 7.4   Recent Labs    01/16/19 1404  WBC 3.7*  NEUTROABS 1,547  HGB 14.3  HCT 42.3  MCV 86.2  PLT 136*   Lab Results  Component Value Date   TSH 3.61 01/16/2019   Lab Results  Component Value Date   HGBA1C 6.3 (H) 01/16/2019   Lab Results  Component Value Date   CHOL 247 (H) 01/16/2019   HDL 45 01/16/2019   LDLCALC 167 (H) 01/16/2019   TRIG 193 (H) 01/16/2019   CHOLHDL 5.5 (H) 01/16/2019    Significant Diagnostic Results in last 30 days:  No results found.  Assessment/Plan 1. Tobacco use disorder, continuous smokes 1 pack every 2 days.will increase Bupropion 150 mg tablet to twice daily.  - buPROPion (WELLBUTRIN SR) 150 MG 12 hr tablet; Take 1 tablet (150 mg total) by mouth twice daily.  Dispense: 90 tablet; Refill: 1  2. Essential hypertension B/p at goal.continue current medication.Requires refill.  - hydrALAZINE (APRESOLINE) 25 MG tablet; Take 1 tablet (25 mg total) by mouth 3 (three) times daily.  Dispense: 270 tablet; Refill: 1 - amLODipine (NORVASC) 10 MG tablet; Take 1 tablet (10 mg total) by mouth daily.  Dispense: 90 tablet; Refill: 1 - cloNIDine (CATAPRES) 0.1 MG tablet; Take 1 tablet (0.1 mg total) by mouth every 8 (eight) hours as needed.  Dispense: 270 tablet; Refill: 1 - atorvastatin (  LIPITOR) 80 MG tablet; Take 0.5 tablets (40 mg total) by mouth daily.  Dispense: 90 tablet; Refill: 1  3. Type II diabetes mellitus with neurological manifestations ([place]) Diet controlled.Due for lab work.states fasting.continue on dietary and  lifestyle modification. - atorvastatin (LIPITOR) 80 MG tablet; Take 0.5 tablets (40 mg total) by mouth daily.  Dispense: 90 tablet; Refill: 1  4. Dyslipidemia associated with type 2 diabetes mellitus (HCC) Latest LDL not at goal.Encouraged dietary and lifestyle modification.due for fasting labs today. - atorvastatin (LIPITOR) 80 MG tablet; Take 0.5 tablets (40 mg total) by mouth daily.  Dispense: 90 tablet; Refill: 1  5. Need for influenza vaccination Afebrile.No signs of Upper Respiratory infections. - Flu Vaccine QUAD 6+ mos PF IM (Fluarix Quad PF).  6. Gastroesophageal reflux disease without esophagitis Symptoms stable on Protonix.Requires refill on medication.Also due for CBC today - pantoprazole (PROTONIX) 40 MG tablet; Take 1 tablet (40 mg total) by mouth 2 (two) times daily.  Dispense: 180 tablet; Refill: 1  7.symptoms of urinary tract infections Afebrile.Reports increased urine frequency and urgency.States unable to void this visit.He was given a cup of water but was not able to provide urine specimen.will return 10/01/2019 to give urine specimen.Encouraged to notify provider if symptoms worsen or go to the ED/urgent care over the weekend.  Family/ staff Communication: Reviewed plan of care with patient.  Labs/tests ordered: Fasting Labs orders in place.   Next Appointment: 4 months for medical management of chronic issues   Sandrea Hughs, NP

## 2019-09-29 LAB — CBC WITH DIFFERENTIAL/PLATELET
Absolute Monocytes: 482 cells/uL (ref 200–950)
Basophils Absolute: 28 cells/uL (ref 0–200)
Basophils Relative: 0.5 %
Eosinophils Absolute: 90 cells/uL (ref 15–500)
Eosinophils Relative: 1.6 %
HCT: 44.7 % (ref 38.5–50.0)
Hemoglobin: 15 g/dL (ref 13.2–17.1)
Lymphs Abs: 1910 cells/uL (ref 850–3900)
MCH: 29.2 pg (ref 27.0–33.0)
MCHC: 33.6 g/dL (ref 32.0–36.0)
MCV: 87 fL (ref 80.0–100.0)
MPV: 13.3 fL — ABNORMAL HIGH (ref 7.5–12.5)
Monocytes Relative: 8.6 %
Neutro Abs: 3091 cells/uL (ref 1500–7800)
Neutrophils Relative %: 55.2 %
Platelets: 164 10*3/uL (ref 140–400)
RBC: 5.14 10*6/uL (ref 4.20–5.80)
RDW: 14.1 % (ref 11.0–15.0)
Total Lymphocyte: 34.1 %
WBC: 5.6 10*3/uL (ref 3.8–10.8)

## 2019-09-29 LAB — COMPREHENSIVE METABOLIC PANEL
AG Ratio: 1.4 (calc) (ref 1.0–2.5)
ALT: 31 U/L (ref 9–46)
AST: 22 U/L (ref 10–35)
Albumin: 4.6 g/dL (ref 3.6–5.1)
Alkaline phosphatase (APISO): 128 U/L (ref 35–144)
BUN/Creatinine Ratio: 10 (calc) (ref 6–22)
BUN: 16 mg/dL (ref 7–25)
CO2: 23 mmol/L (ref 20–32)
Calcium: 9.8 mg/dL (ref 8.6–10.3)
Chloride: 103 mmol/L (ref 98–110)
Creat: 1.62 mg/dL — ABNORMAL HIGH (ref 0.70–1.25)
Globulin: 3.4 g/dL (calc) (ref 1.9–3.7)
Glucose, Bld: 133 mg/dL — ABNORMAL HIGH (ref 65–99)
Potassium: 4.3 mmol/L (ref 3.5–5.3)
Sodium: 141 mmol/L (ref 135–146)
Total Bilirubin: 0.5 mg/dL (ref 0.2–1.2)
Total Protein: 8 g/dL (ref 6.1–8.1)

## 2019-09-29 LAB — HEMOGLOBIN A1C
Hgb A1c MFr Bld: 6.9 % of total Hgb — ABNORMAL HIGH (ref <5.7)
Mean Plasma Glucose: 151 (calc)
eAG (mmol/L): 8.4 (calc)

## 2019-09-29 LAB — LIPID PANEL
Cholesterol: 159 mg/dL (ref <200)
HDL: 47 mg/dL (ref >=40)
LDL Cholesterol (Calc): 89 mg/dL (calc)
Non-HDL Cholesterol (Calc): 112 mg/dL (calc) (ref <130)
Total CHOL/HDL Ratio: 3.4 (calc) (ref <5.0)
Triglycerides: 125 mg/dL (ref <150)

## 2019-09-29 LAB — TSH: TSH: 4.36 mIU/L (ref 0.40–4.50)

## 2019-10-01 ENCOUNTER — Other Ambulatory Visit: Payer: Self-pay | Admitting: Family

## 2019-10-01 ENCOUNTER — Other Ambulatory Visit: Payer: Medicare HMO

## 2019-10-01 ENCOUNTER — Other Ambulatory Visit: Payer: Self-pay

## 2019-10-01 DIAGNOSIS — R399 Unspecified symptoms and signs involving the genitourinary system: Secondary | ICD-10-CM | POA: Diagnosis not present

## 2019-10-03 LAB — URINE CULTURE
MICRO NUMBER:: 1132216
SPECIMEN QUALITY:: ADEQUATE

## 2019-10-03 LAB — URINALYSIS, ROUTINE W REFLEX MICROSCOPIC
Bacteria, UA: NONE SEEN /HPF
Bilirubin Urine: NEGATIVE
Glucose, UA: NEGATIVE
Hgb urine dipstick: NEGATIVE
Hyaline Cast: NONE SEEN /LPF
Nitrite: NEGATIVE
Specific Gravity, Urine: 1.024 (ref 1.001–1.03)
Squamous Epithelial / HPF: NONE SEEN /HPF (ref <=5)
pH: <=5 (ref 5.0–8.0)

## 2019-10-09 ENCOUNTER — Ambulatory Visit (INDEPENDENT_AMBULATORY_CARE_PROVIDER_SITE_OTHER): Payer: Medicare HMO | Admitting: Family

## 2019-10-09 ENCOUNTER — Other Ambulatory Visit: Payer: Self-pay

## 2019-10-09 ENCOUNTER — Encounter: Payer: Self-pay | Admitting: Family

## 2019-10-09 VITALS — BP 126/88 | HR 67 | Temp 96.9°F | Ht 74.0 in | Wt 293.4 lb

## 2019-10-09 DIAGNOSIS — E119 Type 2 diabetes mellitus without complications: Secondary | ICD-10-CM

## 2019-10-09 DIAGNOSIS — Z720 Tobacco use: Secondary | ICD-10-CM

## 2019-10-09 DIAGNOSIS — I10 Essential (primary) hypertension: Secondary | ICD-10-CM

## 2019-10-09 DIAGNOSIS — E785 Hyperlipidemia, unspecified: Secondary | ICD-10-CM | POA: Diagnosis not present

## 2019-10-09 MED ORDER — METFORMIN HCL 500 MG PO TABS: 250.0000 mg | ORAL_TABLET | Freq: Every day | ORAL | 0 refills | Status: DC

## 2019-10-09 NOTE — Progress Notes (Signed)
Provider: Marlowe Sax FNP-C  Lareina Espino, Nelda Bucks, NP  Patient Care Team: Costella Schwarz, Nelda Bucks, NP as PCP - General (Family Medicine) [facility], [place] ([place])  Extended Emergency Contact Information Primary Emergency Contact: [name],Nicole Address: [address]          [place], [place] [address] [name] Phone: [phone] Relation: Daughter Secondary Emergency Contact: Damron,Amish  [place] Phone: [phone] Relation: Other  Code Status:  DNR Goals of care: Advanced Directive information Advanced Directives 10/09/2019  Does Patient Have a Medical Advance Directive? No  Type of Advance Directive -  Does patient want to make changes to medical advance directive? -  Copy of [place] in Chart? -  Would patient like information on creating a medical advance directive? Yes (MAU/Ambulatory/Procedural Areas - Information given)     Chief Complaint  Patient presents with  . Follow-up    Follow up on lab results.   . Quality Metric Gaps    Needs diabetic eye and foot exams, both have been ordered previously.    HPI:  Pt is a 58 y.o. male seen today for an acute visit to discuss lab results.His recent Hgb A1C continues to trend upwards: A1C 6.2 > 6.3 > 6.9 He states his diet includes white rice, eats a whole pizza  By himself,drinks spirite one a day and likes potatoes.He does walks to the mail and back daily but does not exercise.he watches TV most of the time. He denies any signs of hypo/hyperglycemia.His total cholesterol,TRG and LDL has improved compared to previous level.  He was referred to Ophthalmology and podiatrist on previous visit still awaiting appointment.    Past Medical History:  Diagnosis Date  . Acute respiratory failure ([place])   . Cerebral edema (HCC)   . Diabetes mellitus without complication (HCC)    diet controlled- no meds  . Dysphagia as late effect of  cerebrovascular disease   . Fall at home   . Hemiparesis affecting left side as late effect of stroke ([place])   . Hyperlipidemia   . Hypertension   . Stroke ([place]) 2016  . Tuberculosis    11 yrs ago per pt.- treated with meds per pt.   Past Surgical History:  Procedure Laterality Date  . COLONOSCOPY  10/06/2016  . ESOPHAGOGASTRODUODENOSCOPY N/A 08/12/2015   Procedure: ESOPHAGOGASTRODUODENOSCOPY (EGD);  Surgeon: Ladene Artist, MD;  Location[facility] ENDOSCOPY;  Service: Endoscopy;  Laterality: N/A;  . feeding tube removed    . GASTROSTOMY TUBE PLACEMENT  05-19-15  . mechanical thrombectomy angioplasty stenet placement    . SKIN GRAFT  1980's   ankle  . UPPER GASTROINTESTINAL ENDOSCOPY      No Known Allergies  Outpatient Encounter Medications as of 10/09/2019  Medication Sig  . amLODipine (NORVASC) 10 MG tablet Take 1 tablet (10 mg total) by mouth daily.  Marland Kitchen aspirin 325 MG tablet Take 1 tablet (325 mg total) by mouth daily.  Marland Kitchen atorvastatin (LIPITOR) 80 MG tablet Take 0.5 tablets (40 mg total) by mouth daily.  Marland Kitchen buPROPion (WELLBUTRIN SR) 150 MG 12 hr tablet Take 1 tablet (150 mg total) by mouth 2 (two) times daily.  . cloNIDine (CATAPRES) 0.1 MG tablet Take 1 tablet (0.1 mg total) by mouth every 8 (eight) hours as needed.  Marland Kitchen FLUoxetine (PROZAC) 10 MG capsule Take 1 capsule (10 mg total) by mouth daily.  . hydrALAZINE (APRESOLINE) 25 MG tablet Take 1 tablet (25 mg total)  by mouth 3 (three) times daily.  . OMEGA-3 FATTY ACIDS PO Take 2 Gm capsule  by mouth twice daily  . pantoprazole (PROTONIX) 40 MG tablet Take 1 tablet (40 mg total) by mouth 2 (two) times daily.   Facility-Administered Encounter Medications as of 10/09/2019  Medication  . 0.9 %  sodium chloride infusion    Review of Systems  Constitutional: Negative for appetite change, chills, fatigue and fever.  HENT: Negative for congestion, rhinorrhea, sinus pressure, sinus pain, sneezing, sore throat and tinnitus.   Respiratory:  Negative for cough, chest tightness, shortness of breath and wheezing.   Cardiovascular: Negative for chest pain, palpitations and leg swelling.  Gastrointestinal: Negative for abdominal distention, abdominal pain, constipation, diarrhea and nausea.  Endocrine: Negative for cold intolerance, heat intolerance, polydipsia, polyphagia and polyuria.  Genitourinary: Negative for difficulty urinating, dysuria, flank pain and urgency.       Urine frequency has improved   Musculoskeletal: Positive for gait problem. Negative for arthralgias and back pain.  Skin: Negative for color change, pallor and rash.  Neurological: Negative for dizziness, speech difficulty, light-headedness, numbness and headaches.       Left hand weakness   Hematological: Does not bruise/bleed easily.  Psychiatric/Behavioral: Negative for agitation, confusion and sleep disturbance. The patient is not nervous/anxious.     Immunization History  Administered Date(s) Administered  . Influenza,inj,Quad PF,6+ Mos 08/10/2015, 09/22/2016, 07/29/2017, 11/27/2018, 09/28/2019  . Pneumococcal Conjugate-13 01/16/2019  . Pneumococcal Polysaccharide-23 12/10/2016  . Tdap 01/16/2019   Pertinent  Health Maintenance Due  Topic Date Due  . OPHTHALMOLOGY EXAM  11/09/1968  . FOOT EXAM  01/12/2019  . HEMOGLOBIN A1C  03/27/2020  . COLONOSCOPY  03/24/2023  . INFLUENZA VACCINE  Completed   Fall Risk  10/09/2019 04/24/2019 02/22/2019 01/16/2019 11/27/2018  Falls in the past year? 0 0 0 0 0  Number falls in past yr: - 0 0 0 0  Injury with Fall? - 0 0 0 0  Risk for fall due to : - - - - -    Vitals:   10/09/19 1409  BP: 126/88  Pulse: 67  Temp: (!) 96.9 F (36.1 C)  TempSrc: Temporal  SpO2: 94%  Weight: 293 lb 6.4 oz (133.1 kg)  Height: 6' 2"  (1.88 m)   Body mass index is 37.67 kg/m. Physical Exam Vitals signs reviewed.  Constitutional:      General: He is not in acute distress.    Appearance: He is obese. He is not ill-appearing.   HENT:     Head: Normocephalic.  Eyes:     General: No scleral icterus.       Right eye: No discharge.        Left eye: No discharge.     Conjunctiva/sclera: Conjunctivae normal.     Pupils: Pupils are equal, round, and reactive to light.  Neck:     Musculoskeletal: Normal range of motion. No neck rigidity or muscular tenderness.     Vascular: No carotid bruit.  Cardiovascular:     Rate and Rhythm: Normal rate and regular rhythm.     Pulses: Normal pulses.     Heart sounds: Normal heart sounds. No murmur. No friction rub. No gallop.   Pulmonary:     Effort: Pulmonary effort is normal. No respiratory distress.     Breath sounds: Normal breath sounds. No wheezing, rhonchi or rales.  Chest:     Chest wall: No tenderness.  Abdominal:     General: Bowel sounds are normal.  There is no distension.     Palpations: Abdomen is soft. There is no mass.     Tenderness: There is no abdominal tenderness. There is no right CVA tenderness, left CVA tenderness, guarding or rebound.  Musculoskeletal:        General: No swelling or tenderness.     Right lower leg: No edema.     Left lower leg: No edema.     Comments: Unsteady gait.left hand contracture.  Lymphadenopathy:     Cervical: No cervical adenopathy.  Skin:    General: Skin is warm and dry.     Coloration: Skin is not pale.     Findings: No bruising, erythema or rash.  Neurological:     Mental Status: He is oriented to person, place, and time.     Cranial Nerves: No cranial nerve deficit.     Sensory: No sensory deficit.     Gait: Gait abnormal.     Comments: Left side weakness   Psychiatric:        Mood and Affect: Mood normal.        Behavior: Behavior normal.        Thought Content: Thought content normal.        Judgment: Judgment normal.    Labs reviewed: Recent Labs    01/16/19 1404 09/28/19 1534  NA 139 141  K 4.4 4.3  CL 104 103  CO2 24 23  GLUCOSE 109 133*  BUN 21 16  CREATININE 1.59* 1.62*  CALCIUM 9.7 9.8    Recent Labs    01/16/19 1404 09/28/19 1534  AST 24 22  ALT 24 31  BILITOT 0.4 0.5  PROT 7.4 8.0   Recent Labs    01/16/19 1404 09/28/19 1534  WBC 3.7* 5.6  NEUTROABS 1,547 3,091  HGB 14.3 15.0  HCT 42.3 44.7  MCV 86.2 87.0  PLT 136* 164   Lab Results  Component Value Date   TSH 4.36 09/28/2019   Lab Results  Component Value Date   HGBA1C 6.9 (H) 09/28/2019   Lab Results  Component Value Date   CHOL 159 09/28/2019   HDL 47 09/28/2019   LDLCALC 89 09/28/2019   TRIG 125 09/28/2019   CHOLHDL 3.4 09/28/2019    Significant Diagnostic Results in last 30 days:  No results found.  Assessment/Plan 1. Type 2 diabetes mellitus without complication, without long-term current use of insulin (HCC) Lab Results  Component Value Date   HGBA1C 6.9 (H) 09/28/2019  A1C trending up.discussed dietary modification with patient.advise to cut done on carbohydrate and soda.Exercise limited due to left side weakness.chair exercise advised.DASH diet information provided on AVS.will initiate metformin 250 mg tablet daily.  - metFORMIN (GLUCOPHAGE) 500 MG tablet; Take 0.5 tablets (250 mg total) by mouth daily with breakfast.  Dispense: 90 tablet; Refill: 0 - Hemoglobin A1c; Future - TSH; Future - CMP with eGFR([facility]); Future  2. Essential hypertension B/p at goal.continue on amlodipine 10 mg tablet daily,hydralazine 25 mg tablet three times daily and clonidine 0.1 mg tablet every 8 hrs as needed.Encouraged to quit smoking.  - CBC with Differential/Platelet; Future - TSH; Future - CMP with eGFR([facility]); Future  3. Dyslipidemia LDL not at goal for diabetic but has improved.continue on Atorvastatin 80 mg tablet daily and omega-3 fatty acids.  - Lipid panel; Future  4. Tobacco abuse Currently smoking 1/2 pack per day.smoking cessation advised.Continue on Bupropion 150 mg 12 HR twice daily.   Family/ staff Communication: Reviewed plan of care with patient.  Labs/tests ordered:  -  Lipid panel; Future - Hemoglobin A1c; Future - TSH; Future - CMP with eGFR([facility]); Future Next appointment: 3 months for medical management of chronic issues.Labs prior to visit.  Sandrea Hughs, NP

## 2019-10-09 NOTE — Patient Instructions (Signed)

## 2019-10-25 ENCOUNTER — Other Ambulatory Visit: Payer: Self-pay | Admitting: Family

## 2019-10-25 DIAGNOSIS — I10 Essential (primary) hypertension: Secondary | ICD-10-CM

## 2019-10-25 NOTE — Telephone Encounter (Signed)
RX was filled at a different dose on 09/28/2019 for a 3 month supply with an additional refill.

## 2019-11-23 ENCOUNTER — Ambulatory Visit: Payer: Medicare HMO | Admitting: Podiatry

## 2019-12-15 ENCOUNTER — Other Ambulatory Visit: Payer: Self-pay | Admitting: Family

## 2019-12-15 DIAGNOSIS — F17209 Nicotine dependence, unspecified, with unspecified nicotine-induced disorders: Secondary | ICD-10-CM

## 2020-01-17 ENCOUNTER — Ambulatory Visit (INDEPENDENT_AMBULATORY_CARE_PROVIDER_SITE_OTHER): Payer: Medicare HMO | Admitting: Family

## 2020-01-17 ENCOUNTER — Other Ambulatory Visit: Payer: Self-pay

## 2020-01-17 ENCOUNTER — Encounter: Payer: Self-pay | Admitting: Family

## 2020-01-17 ENCOUNTER — Other Ambulatory Visit: Payer: Medicare HMO

## 2020-01-17 DIAGNOSIS — Z0000 Encounter for general adult medical examination without abnormal findings: Secondary | ICD-10-CM | POA: Diagnosis not present

## 2020-01-17 NOTE — Progress Notes (Signed)
Patient ID: [name] [name]., male   DOB: Mar 16, 1961, 59 y.o.   MRN: [id number] This service is provided via telemedicine  No vital signs collected/recorded due to the encounter was a telemedicine visit.   Location of patient (ex: home, work):  HOME   Patient consents to a telephone visit:  YES  Location of the provider (ex: office, home):  OFFICE  Name of any referring provider:  [facility] NGETICH, NP  Names of all persons participating in the telemedicine service and their role in the encounter:  PATIENT, Edwin Dada, [place], [facility] NGETICH, NP  Time spent on call:  5;57

## 2020-01-17 NOTE — Progress Notes (Signed)
Subjective:   [name] Cuffee. is a 59 y.o. male who presents for Medicare Annual/Subsequent preventive examination.  Review of Systems:  Cardiac Risk Factors include: advanced age (>81men, >33 women);hypertension;diabetes mellitus;dyslipidemia;male gender;obesity (BMI >30kg/m2);smoking/ tobacco exposure     Objective:    Vitals: There were no vitals taken for this visit.  There is no height or weight on file to calculate BMI.  Advanced Directives 10/09/2019 01/16/2019 11/27/2018 01/11/2018 07/29/2017 04/22/2017 12/24/2016  Does Patient Have a Medical Advance Directive? No No No Yes No No Yes  Type of Advance Directive - - Public librarian;Living will - - [place]  Does patient want to make changes to medical advance directive? - - - No - Patient declined - - -  Copy of [place] in Chart? - - - No - copy requested - - No - copy requested  Would patient like information on creating a medical advance directive? Yes (MAU/Ambulatory/Procedural Areas - Information given) No - Patient declined No - Patient declined - Yes (ED - Information included in AVS) No - Patient declined -    Tobacco Social History   Tobacco Use  Smoking Status Current Every Day Smoker  . Packs/day: 0.50  . Types: Cigarettes  Smokeless Tobacco Never Used     Ready to quit: No Counseling given: No   Clinical Intake:  Pre-visit preparation completed: No  Pain : 0-10 Pain Score: 9  Pain Type: Chronic pain Pain Location: Hand Pain Orientation: Right Pain Radiating Towards: No Pain Descriptors / Indicators: Aching Pain Onset: (several weeks) Pain Frequency: Intermittent Pain Relieving Factors: Tylenol, Effect of Pain on Daily Activities: No  Pain Relieving Factors: Tylenol,  BMI - recorded: 37.67 Diabetes: Yes CBG done?: No Did pt. bring in CBG monitor from home?: No  How often do you need to have someone help you when you read instructions,  pamphlets, or other written materials from your doctor or pharmacy?: 3 - Sometimes What is the last grade level you completed in school?: 12 Grade  Interpreter Needed?: No  Information entered by :: Wladyslaw Henrichs FNP-C  Past Medical History:  Diagnosis Date  . Acute respiratory failure ([place])   . Cerebral edema (HCC)   . Diabetes mellitus without complication (HCC)    diet controlled- no meds  . Dysphagia as late effect of cerebrovascular disease   . Fall at home   . Hemiparesis affecting left side as late effect of stroke ([place])   . Hyperlipidemia   . Hypertension   . Stroke ([place]) 2016  . Tuberculosis    11 yrs ago per pt.- treated with meds per pt.   Past Surgical History:  Procedure Laterality Date  . COLONOSCOPY  10/06/2016  . ESOPHAGOGASTRODUODENOSCOPY N/A 08/12/2015   Procedure: ESOPHAGOGASTRODUODENOSCOPY (EGD);  Surgeon: Ladene Artist, MD;  Location: [facility] ENDOSCOPY;  Service: Endoscopy;  Laterality: N/A;  . feeding tube removed    . GASTROSTOMY TUBE PLACEMENT  05-19-15  . mechanical thrombectomy angioplasty stenet placement    . SKIN GRAFT  1980's   ankle  . UPPER GASTROINTESTINAL ENDOSCOPY     Family History  Problem Relation Age of Onset  . Alcohol abuse Sister   . Alcohol abuse Brother   . Alcohol abuse Father   . Colon cancer Neg Hx   . Colon polyps Neg Hx   . Rectal cancer Neg Hx   . Stomach cancer Neg Hx   . Esophageal cancer Neg Hx  Social History   Socioeconomic History  . Marital status: Single    Spouse name: Not on file  . Number of children: Not on file  . Years of education: Not on file  . Highest education level: Not on file  Occupational History  . Not on file  Tobacco Use  . Smoking status: Current Every Day Smoker    Packs/day: 0.50    Types: Cigarettes  . Smokeless tobacco: Never Used  Substance and Sexual Activity  . Alcohol use: No    Alcohol/week: 0.0 standard drinks  . Drug use: No  . Sexual activity: Yes  Other Topics  Concern  . Not on file  Social History Narrative   Diet?  No salt      Do you drink/eat things with caffeine? none      Do you live in a house, apartment, assisted living, condo, trailer, etc.? Apartment      Is it one or more stories? One, flat      How many persons live in your home?      Do you have any pets in your home? (please list)  no      Current or past profession:      Do you exercise?      no                                Type & how often?      Do you have a living will? no      Do you have a DNR form?    no                              If not, do you want to discuss one?      Do you have signed POA/HPOA for forms?       Social Determinants of Health   Financial Resource Strain:   . Difficulty of Paying Living Expenses:   Food Insecurity:   . Worried About Charity fundraiser in the Last Year:   . Arboriculturist in the Last Year:   Transportation Needs:   . Film/video editor (Medical):   Marland Kitchen Lack of Transportation (Non-Medical):   Physical Activity:   . Days of Exercise per Week:   . Minutes of Exercise per Session:   Stress:   . Feeling of Stress :   Social Connections:   . Frequency of Communication with Friends and Family:   . Frequency of Social Gatherings with Friends and Family:   . Attends Religious Services:   . Active Member of Clubs or Organizations:   . Attends Archivist Meetings:   Marland Kitchen Marital Status:     Outpatient Encounter Medications as of 01/17/2020  Medication Sig  . amLODipine (NORVASC) 10 MG tablet Take 1 tablet (10 mg total) by mouth daily.  Marland Kitchen aspirin 325 MG tablet Take 1 tablet (325 mg total) by mouth daily.  Marland Kitchen atorvastatin (LIPITOR) 80 MG tablet Take 0.5 tablets (40 mg total) by mouth daily.  Marland Kitchen buPROPion (WELLBUTRIN SR) 150 MG 12 hr tablet TAKE 1 TABLET BY MOUTH TWICE A DAY  . cloNIDine (CATAPRES) 0.1 MG tablet Take 1 tablet (0.1 mg total) by mouth every 8 (eight) hours as needed.  Marland Kitchen FLUoxetine (PROZAC) 10 MG  capsule Take 1 capsule (10 mg total) by mouth daily.  Marland Kitchen  hydrALAZINE (APRESOLINE) 25 MG tablet Take 1 tablet (25 mg total) by mouth 3 (three) times daily.  . metFORMIN (GLUCOPHAGE) 500 MG tablet Take 0.5 tablets (250 mg total) by mouth daily with breakfast.  . OMEGA-3 FATTY ACIDS PO Take 2 Gm capsule  by mouth twice daily  . pantoprazole (PROTONIX) 40 MG tablet Take 1 tablet (40 mg total) by mouth 2 (two) times daily.   Facility-Administered Encounter Medications as of 01/17/2020  Medication  . 0.9 %  sodium chloride infusion    Activities of Daily Living In your present state of health, do you have any difficulty performing the following activities: 01/17/2020  Hearing? N  Vision? N  Walking or climbing stairs? Y  Comment uses a cane sometimes  Dressing or bathing? Y  Comment Niece assist with socks  Doing errands, shopping? Y  Comment niece assist with drinking  Preparing Food and eating ? N  Using the Toilet? N  In the past six months, have you accidently leaked urine? N  Do you have problems with loss of bowel control? N  Managing your Medications? N  Managing your Finances? N  Housekeeping or managing your Housekeeping? N  Some recent data might be hidden    Patient Care Team: Teri Legacy, Nelda Bucks, NP as PCP - General (Family Medicine) Center, [place] ([place])   Assessment:   This is a routine wellness examination for [name] [name].  Exercise Activities and Dietary recommendations Current Exercise Habits: Home exercise routine, Type of exercise: walking, Time (Minutes): 30, Frequency (Times/Week): 7, Weekly Exercise (Minutes/Week): 210, Intensity: Mild, Exercise limited by: None identified  Goals    . Exercise 3x per week (30 min per time)     Paitient would like to start silver sneakers     . Increase water intake     Starting 12/10/16, I will attempt to increase my water intake from 2-16 oz bottles of water per day to 5-16 oz. Bottles of water.         Fall Risk Fall Risk  01/17/2020 10/09/2019 04/24/2019 02/22/2019 01/16/2019  Falls in the past year? 0 0 0 0 0  Number falls in past yr: 0 - 0 0 0  Injury with Fall? 0 - 0 0 0  Risk for fall due to : - - - - -   Is the patient's home free of loose throw rugs in walkways, pet beds, electrical cords, etc?   no      Grab bars in the bathroom? no      Handrails on the stairs?   no      Adequate lighting?   yes  Depression Screen PHQ 2/9 Scores 01/17/2020 01/16/2019 01/11/2018 01/11/2018  PHQ - 2 Score 0 0 0 0    Cognitive Function MMSE - Mini Mental State Exam 01/11/2018 12/10/2016  Not completed: - Unable to complete  Orientation to time 4 -  Orientation to Place 5 -  Registration 3 -  Attention/ Calculation 0 -  Recall 3 -  Language- name 2 objects 2 -  Language- repeat 1 -  Language- follow 3 step command 3 -  Language- read & follow direction 1 -  Write a sentence 1 -  Copy design 0 -  Total score 23 -     6CIT Screen 01/17/2020  What Year? 0 points  What month? 0 points  What time? 0 points  Count back from 20 2 points  Months in reverse 4 points  Repeat phrase  10 points  Total Score 16    Immunization History  Administered Date(s) Administered  . Influenza,inj,Quad PF,6+ Mos 08/10/2015, 09/22/2016, 07/29/2017, 11/27/2018, 09/28/2019  . Pneumococcal Conjugate-13 01/16/2019  . Pneumococcal Polysaccharide-23 12/10/2016  . Tdap 01/16/2019    Qualifies for Shingles Vaccine? No   Screening Tests Health Maintenance  Topic Date Due  . OPHTHALMOLOGY EXAM  Never done  . FOOT EXAM  01/12/2019  . HEMOGLOBIN A1C  03/27/2020  . COLONOSCOPY  03/24/2023  . TETANUS/TDAP  01/15/2029  . INFLUENZA VACCINE  Completed  . PNEUMOCOCCAL POLYSACCHARIDE VACCINE AGE 9-64 HIGH RISK  Completed  . Hepatitis C Screening  Completed  . HIV Screening  Completed   Cancer Screenings: Lung: Low Dose CT Chest recommended if Age 67-80 years, 30 pack-year currently smoking OR have quit w/in  15years. Patient does not qualify. Colorectal:Up to date   Additional Screenings: Hepatitis C Screening:completed       Plan:   - Annual eye exam with Ophthalmology  - Shingles vaccine advised to get vaccine at the Pharmacy.  I have personally reviewed and noted the following in the patient's chart:   . Medical and social history . Use of alcohol, tobacco or illicit drugs  . Current medications and supplements . Functional ability and status . Nutritional status . Physical activity . Advanced directives . List of other physicians . Hospitalizations, surgeries, and ER visits in previous 12 months . Vitals . Screenings to include cognitive, depression, and falls . Referrals and appointments  In addition, I have reviewed and discussed with patient certain preventive protocols, quality metrics, and best practice recommendations. A written personalized care plan for preventive services as well as general preventive health recommendations were provided to patient.  Sandrea Hughs, NP  01/17/2020

## 2020-01-17 NOTE — Patient Instructions (Signed)
[name] , Thank you for taking time to come for your Medicare Wellness Visit. I appreciate your ongoing commitment to your health goals. Please review the following plan we discussed and let me know if I can assist you in the future.   Screening recommendations/referrals: Colonoscopy : Up to date  Recommended yearly ophthalmology/optometry visit for glaucoma screening and checkup Recommended yearly dental visit for hygiene and checkup  Vaccinations: Influenza vaccine : Up to date  Pneumococcal vaccine: Up to date  Tdap vaccine : Up to date  Shingles vaccine: Advised to get shingles vaccine at the Pharmacy     Advanced directives: No   Conditions/risks identified: Advance age men > 32 yrs,dyslipidemia,male Gender,BMI > 30 and Smoking   Next appointment: 1 year   Preventive Care 61-64 Years, Male Preventive care refers to lifestyle choices and visits with your health care provider that can promote health and wellness. What does preventive care include?  A yearly physical exam. This is also called an annual well check.  Dental exams once or twice a year.  Routine eye exams. Ask your health care provider how often you should have your eyes checked.  Personal lifestyle choices, including:  Daily care of your teeth and gums.  Regular physical activity.  Eating a healthy diet.  Avoiding tobacco and drug use.  Limiting alcohol use.  Practicing safe sex.  Taking low-dose aspirin every day starting at age 61. What happens during an annual well check? The services and screenings done by your health care provider during your annual well check will depend on your age, overall health, lifestyle risk factors, and family history of disease. Counseling  Your health care provider may ask you questions about your:  Alcohol use.  Tobacco use.  Drug use.  Emotional well-being.  Home and relationship well-being.  Sexual activity.  Eating habits.  Work and work  Statistician. Screening  You may have the following tests or measurements:  Height, weight, and BMI.  Blood pressure.  Lipid and cholesterol levels. These may be checked every 5 years, or more frequently if you are over 61 years old.  Skin check.  Lung cancer screening. You may have this screening every year starting at age 61 if you have a 30-pack-year history of smoking and currently smoke or have quit within the past 15 years.  Fecal occult blood test (FOBT) of the stool. You may have this test every year starting at age 61.  Flexible sigmoidoscopy or colonoscopy. You may have a sigmoidoscopy every 5 years or a colonoscopy every 10 years starting at age 67.  Prostate cancer screening. Recommendations will vary depending on your family history and other risks.  Hepatitis C blood test.  Hepatitis B blood test.  Sexually transmitted disease (STD) testing.  Diabetes screening. This is done by checking your blood sugar (glucose) after you have not eaten for a while (fasting). You may have this done every 1-3 years. Discuss your test results, treatment options, and if necessary, the need for more tests with your health care provider. Vaccines  Your health care provider may recommend certain vaccines, such as:  Influenza vaccine. This is recommended every year.  Tetanus, diphtheria, and acellular pertussis (Tdap, Td) vaccine. You may need a Td booster every 10 years.  Zoster vaccine. You may need this after age 19.  Pneumococcal 13-valent conjugate (PCV13) vaccine. You may need this if you have certain conditions and have not been vaccinated.  Pneumococcal polysaccharide (PPSV23) vaccine. You may need one or  two doses if you smoke cigarettes or if you have certain conditions. Talk to your health care provider about which screenings and vaccines you need and how often you need them. This information is not intended to replace advice given to you by your health care provider. Make  sure you discuss any questions you have with your health care provider. Document Released: 11/21/2015 Document Revised: 07/14/2016 Document Reviewed: 08/26/2015 Elsevier Interactive Patient Education  2017 [place] Prevention in the Home Falls can cause injuries. They can happen to people of all ages. There are many things you can do to make your home safe and to help prevent falls. What can I do on the outside of my home?  Regularly fix the edges of walkways and driveways and fix any cracks.  Remove anything that might make you trip as you walk through a door, such as a raised step or threshold.  Trim any bushes or trees on the path to your home.  Use bright outdoor lighting.  Clear any walking paths of anything that might make someone trip, such as rocks or tools.  Regularly check to see if handrails are loose or broken. Make sure that both sides of any steps have handrails.  Any raised decks and porches should have guardrails on the edges.  Have any leaves, snow, or ice cleared regularly.  Use sand or salt on walking paths during winter.  Clean up any spills in your garage right away. This includes oil or grease spills. What can I do in the bathroom?  Use night lights.  Install grab bars by the toilet and in the tub and shower. Do not use towel bars as grab bars.  Use non-skid mats or decals in the tub or shower.  If you need to sit down in the shower, use a plastic, non-slip stool.  Keep the floor dry. Clean up any water that spills on the floor as soon as it happens.  Remove soap buildup in the tub or shower regularly.  Attach bath mats securely with double-sided non-slip rug tape.  Do not have throw rugs and other things on the floor that can make you trip. What can I do in the bedroom?  Use night lights.  Make sure that you have a light by your bed that is easy to reach.  Do not use any sheets or blankets that are too big for your bed. They should  not hang down onto the floor.  Have a firm chair that has side arms. You can use this for support while you get dressed.  Do not have throw rugs and other things on the floor that can make you trip. What can I do in the kitchen?  Clean up any spills right away.  Avoid walking on wet floors.  Keep items that you use a lot in easy-to-reach places.  If you need to reach something above you, use a strong step stool that has a grab bar.  Keep electrical cords out of the way.  Do not use floor polish or wax that makes floors slippery. If you must use wax, use non-skid floor wax.  Do not have throw rugs and other things on the floor that can make you trip. What can I do with my stairs?  Do not leave any items on the stairs.  Make sure that there are handrails on both sides of the stairs and use them. Fix handrails that are broken or loose. Make sure that handrails are  as long as the stairways.  Check any carpeting to make sure that it is firmly attached to the stairs. Fix any carpet that is loose or worn.  Avoid having throw rugs at the top or bottom of the stairs. If you do have throw rugs, attach them to the floor with carpet tape.  Make sure that you have a light switch at the top of the stairs and the bottom of the stairs. If you do not have them, ask someone to add them for you. What else can I do to help prevent falls?  Wear shoes that:  Do not have high heels.  Have rubber bottoms.  Are comfortable and fit you well.  Are closed at the toe. Do not wear sandals.  If you use a stepladder:  Make sure that it is fully opened. Do not climb a closed stepladder.  Make sure that both sides of the stepladder are locked into place.  Ask someone to hold it for you, if possible.  Clearly mark and make sure that you can see:  Any grab bars or handrails.  First and last steps.  Where the edge of each step is.  Use tools that help you move around (mobility aids) if they are  needed. These include:  Canes.  Walkers.  Scooters.  Crutches.  Turn on the lights when you go into a dark area. Replace any light bulbs as soon as they burn out.  Set up your furniture so you have a clear path. Avoid moving your furniture around.  If any of your floors are uneven, fix them.  If there are any pets around you, be aware of where they are.  Review your medicines with your doctor. Some medicines can make you feel dizzy. This can increase your chance of falling. Ask your doctor what other things that you can do to help prevent falls. This information is not intended to replace advice given to you by your health care provider. Make sure you discuss any questions you have with your health care provider. Document Released: 08/21/2009 Document Revised: 04/01/2016 Document Reviewed: 11/29/2014 Elsevier Interactive Patient Education  2017 [employer].

## 2020-01-21 ENCOUNTER — Other Ambulatory Visit: Payer: Medicare HMO

## 2020-01-21 ENCOUNTER — Other Ambulatory Visit: Payer: Self-pay

## 2020-01-21 ENCOUNTER — Other Ambulatory Visit: Payer: Self-pay | Admitting: *Deleted

## 2020-01-21 DIAGNOSIS — I10 Essential (primary) hypertension: Secondary | ICD-10-CM

## 2020-01-21 DIAGNOSIS — E785 Hyperlipidemia, unspecified: Secondary | ICD-10-CM

## 2020-01-21 DIAGNOSIS — E119 Type 2 diabetes mellitus without complications: Secondary | ICD-10-CM | POA: Diagnosis not present

## 2020-01-21 DIAGNOSIS — E1169 Type 2 diabetes mellitus with other specified complication: Secondary | ICD-10-CM

## 2020-01-21 DIAGNOSIS — E1149 Type 2 diabetes mellitus with other diabetic neurological complication: Secondary | ICD-10-CM

## 2020-01-21 DIAGNOSIS — F17209 Nicotine dependence, unspecified, with unspecified nicotine-induced disorders: Secondary | ICD-10-CM

## 2020-01-21 MED ORDER — AMLODIPINE BESYLATE 10 MG PO TABS: 10.0000 mg | ORAL_TABLET | Freq: Every day | ORAL | 1 refills | Status: DC

## 2020-01-21 MED ORDER — METFORMIN HCL 500 MG PO TABS: 250.0000 mg | ORAL_TABLET | Freq: Every day | ORAL | 0 refills | Status: DC

## 2020-01-21 MED ORDER — ATORVASTATIN CALCIUM 80 MG PO TABS: 40.0000 mg | ORAL_TABLET | Freq: Every day | ORAL | 1 refills | Status: DC

## 2020-01-21 MED ORDER — FLUOXETINE HCL 10 MG PO CAPS: 10.0000 mg | ORAL_CAPSULE | Freq: Every day | ORAL | 1 refills | Status: DC

## 2020-01-21 MED ORDER — BUPROPION HCL ER (SR) 150 MG PO TB12: 150.0000 mg | ORAL_TABLET | Freq: Two times a day (BID) | ORAL | 1 refills | Status: DC

## 2020-01-21 MED ORDER — HYDRALAZINE HCL 25 MG PO TABS: 25.0000 mg | ORAL_TABLET | Freq: Three times a day (TID) | ORAL | 1 refills | Status: DC

## 2020-01-22 LAB — COMPLETE METABOLIC PANEL WITH GFR
AG Ratio: 1.2 (calc) (ref 1.0–2.5)
ALT: 25 U/L (ref 9–46)
AST: 21 U/L (ref 10–35)
Albumin: 4.1 g/dL (ref 3.6–5.1)
Alkaline phosphatase (APISO): 94 U/L (ref 35–144)
BUN: 23 mg/dL (ref 7–25)
CO2: 27 mmol/L (ref 20–32)
Calcium: 9.5 mg/dL (ref 8.6–10.3)
Chloride: 104 mmol/L (ref 98–110)
Creat: 1.2 mg/dL (ref 0.70–1.25)
GFR, Est African American: 75 mL/min/{1.73_m2} (ref >=60)
GFR, Est Non African American: 65 mL/min/{1.73_m2} (ref >=60)
Globulin: 3.4 g/dL (calc) (ref 1.9–3.7)
Glucose, Bld: 123 mg/dL — ABNORMAL HIGH (ref 65–99)
Potassium: 4.5 mmol/L (ref 3.5–5.3)
Sodium: 140 mmol/L (ref 135–146)
Total Bilirubin: 0.4 mg/dL (ref 0.2–1.2)
Total Protein: 7.5 g/dL (ref 6.1–8.1)

## 2020-01-22 LAB — LIPID PANEL
Cholesterol: 157 mg/dL (ref <200)
HDL: 42 mg/dL (ref >=40)
LDL Cholesterol (Calc): 83 mg/dL (calc)
Non-HDL Cholesterol (Calc): 115 mg/dL (calc) (ref <130)
Total CHOL/HDL Ratio: 3.7 (calc) (ref <5.0)
Triglycerides: 227 mg/dL — ABNORMAL HIGH (ref <150)

## 2020-01-22 LAB — CBC WITH DIFFERENTIAL/PLATELET
Absolute Monocytes: 465 cells/uL (ref 200–950)
Basophils Absolute: 30 cells/uL (ref 0–200)
Basophils Relative: 0.6 %
Eosinophils Absolute: 160 cells/uL (ref 15–500)
Eosinophils Relative: 3.2 %
HCT: 43 % (ref 38.5–50.0)
Hemoglobin: 14.3 g/dL (ref 13.2–17.1)
Lymphs Abs: 1530 cells/uL (ref 850–3900)
MCH: 29.4 pg (ref 27.0–33.0)
MCHC: 33.3 g/dL (ref 32.0–36.0)
MCV: 88.3 fL (ref 80.0–100.0)
MPV: 13.6 fL — ABNORMAL HIGH (ref 7.5–12.5)
Monocytes Relative: 9.3 %
Neutro Abs: 2815 cells/uL (ref 1500–7800)
Neutrophils Relative %: 56.3 %
Platelets: 125 10*3/uL — ABNORMAL LOW (ref 140–400)
RBC: 4.87 10*6/uL (ref 4.20–5.80)
RDW: 14.1 % (ref 11.0–15.0)
Total Lymphocyte: 30.6 %
WBC: 5 10*3/uL (ref 3.8–10.8)

## 2020-01-22 LAB — HEMOGLOBIN A1C
Hgb A1c MFr Bld: 6.5 % of total Hgb — ABNORMAL HIGH (ref <5.7)
Mean Plasma Glucose: 140 (calc)
eAG (mmol/L): 7.7 (calc)

## 2020-01-22 LAB — TSH: TSH: 2.86 mIU/L (ref 0.40–4.50)

## 2020-01-23 ENCOUNTER — Encounter: Payer: Self-pay | Admitting: Family

## 2020-01-23 ENCOUNTER — Other Ambulatory Visit: Payer: Self-pay

## 2020-01-23 ENCOUNTER — Ambulatory Visit (INDEPENDENT_AMBULATORY_CARE_PROVIDER_SITE_OTHER): Payer: Medicare HMO | Admitting: Family

## 2020-01-23 VITALS — BP 152/88 | HR 67 | Temp 97.9°F | Ht 74.0 in | Wt 294.0 lb

## 2020-01-23 DIAGNOSIS — F325 Major depressive disorder, single episode, in full remission: Secondary | ICD-10-CM | POA: Insufficient documentation

## 2020-01-23 DIAGNOSIS — E785 Hyperlipidemia, unspecified: Secondary | ICD-10-CM

## 2020-01-23 DIAGNOSIS — Z931 Gastrostomy status: Secondary | ICD-10-CM | POA: Insufficient documentation

## 2020-01-23 DIAGNOSIS — K219 Gastro-esophageal reflux disease without esophagitis: Secondary | ICD-10-CM

## 2020-01-23 DIAGNOSIS — E119 Type 2 diabetes mellitus without complications: Secondary | ICD-10-CM

## 2020-01-23 DIAGNOSIS — Z720 Tobacco use: Secondary | ICD-10-CM

## 2020-01-23 DIAGNOSIS — E1169 Type 2 diabetes mellitus with other specified complication: Secondary | ICD-10-CM | POA: Diagnosis not present

## 2020-01-23 DIAGNOSIS — Z6837 Body mass index (BMI) 37.0-37.9, adult: Secondary | ICD-10-CM

## 2020-01-23 DIAGNOSIS — F329 Major depressive disorder, single episode, unspecified: Secondary | ICD-10-CM

## 2020-01-23 DIAGNOSIS — L602 Onychogryphosis: Secondary | ICD-10-CM

## 2020-01-23 DIAGNOSIS — I69354 Hemiplegia and hemiparesis following cerebral infarction affecting left non-dominant side: Secondary | ICD-10-CM | POA: Diagnosis not present

## 2020-01-23 DIAGNOSIS — I10 Essential (primary) hypertension: Secondary | ICD-10-CM | POA: Diagnosis not present

## 2020-01-23 DIAGNOSIS — F32A Depression, unspecified: Secondary | ICD-10-CM

## 2020-01-23 MED ORDER — CLONIDINE HCL 0.1 MG PO TABS: 0.1000 mg | ORAL_TABLET | Freq: Once | ORAL | Status: DC

## 2020-01-23 NOTE — Progress Notes (Signed)
Provider: Marlowe Sax FNP-C   Saphronia Ozdemir, Nelda Bucks, NP  Patient Care Team: Anvi Mangal, Nelda Bucks, NP as PCP - [facility], [place] ([place])  Extended Emergency Contact Information Primary Emergency Contact: [name],Nicole Address: [address]          [place], [place] [address] [name] Phone: [phone] Relation: Daughter Secondary Emergency Contact: Vondrasek,Donelle  [place] Phone: [phone] Relation: Other  Code Status: Full Code  Goals of care: Advanced Directive information Advanced Directives 10/09/2019  Does Patient Have a Medical Advance Directive? No  Type of Advance Directive -  Does patient want to make changes to medical advance directive? -  Copy of [place] in Chart? -  Would patient like information on creating a medical advance directive? Yes (MAU/Ambulatory/Procedural Areas - Information given)     Chief Complaint  Patient presents with  . Medical Management of Chronic Issues    34mh follow-up    HPI:  Pt is a 59y.o. male seen today for 4 month follow up for medical management of chronic diseases.He is here with daughter NElmyra Ricksmills.He denies any acute issues during visit.he states still smokes 2 pack of cigarette per day.on Wellbutrin SR 150 mg 12Hr Tablet twice daily. Type 2 DM - does not check blood sugars at home.on metformin 250 mg tablet daily.He is due for annual diabetic eye and foot exam. Labs reviewed and discussed with patient and daughter Hgb A1C 6.5 improved previous 6.9   Hyperlipidemia - labs reviewed Chol 157,TRG 227,HDL 42,LDL 83 (01/21/2020 ). On atorvastatin 40 mg tablet daily and Omega -3 fatty acid 2 GM twice daily.dietary modification discussed with patient and daughter.  Hypertension - B/p elevated this visit states was in a hurry to come to visit did not take his medication this morning.clonidine given at the office then B/p  rechecked trending down.He denies any chest pain,headache,dizziness,vision changes or shortness of breath.on clonidine 0.1 mg tablet every 8 Hrs PRN,amlodipine 10 mg tablet daily and Hydralazine 25 mg tablet three times daily.   GERD - on Protonix 40 mg tablet twice daily.states no acid reflux.Hgb stable.  Depression - states symptoms stable on Fluoxetine 10 mg tablet daily   Past Medical History:  Diagnosis Date  . Acute respiratory failure ([facility]   . Cerebral edema (HCC)   . Diabetes mellitus without complication (HCC)    diet controlled- no meds  . Dysphagia as late effect of cerebrovascular disease   . Fall at home   . Hemiparesis affecting left side as late effect of stroke ([facility]   . Hyperlipidemia   . Hypertension   . Stroke ([facility] 2016  . Tuberculosis    11 yrs ago per pt.- treated with meds per pt.   Past Surgical History:  Procedure Laterality Date  . COLONOSCOPY  10/06/2016  . ESOPHAGOGASTRODUODENOSCOPY N/A 08/12/2015   Procedure: ESOPHAGOGASTRODUODENOSCOPY (EGD);  Surgeon: MLadene Artist MD;  [facility];  Service: Endoscopy;  Laterality: N/A;  . feeding tube removed    . GASTROSTOMY TUBE PLACEMENT  05-19-15  . mechanical thrombectomy angioplasty stenet placement    . SKIN GRAFT  1980's   ankle  . UPPER GASTROINTESTINAL ENDOSCOPY      No Known Allergies  Allergies as of 01/23/2020   No Known Allergies     Medication List       Accurate as of January 23, 2020  6:13 PM. If you have any questions,  ask your nurse or doctor.        amLODipine 10 MG tablet Commonly known as: NORVASC Take 1 tablet (10 mg total) by mouth daily.   aspirin 325 MG tablet Take 1 tablet (325 mg total) by mouth daily.   atorvastatin 80 MG tablet Commonly known as: LIPITOR Take 0.5 tablets (40 mg total) by mouth daily.   buPROPion 150 MG 12 hr tablet Commonly known as: WELLBUTRIN SR Take 1 tablet (150 mg total) by mouth 2 (two) times daily.   cloNIDine 0.1 MG  tablet Commonly known as: CATAPRES Take 1 tablet (0.1 mg total) by mouth every 8 (eight) hours as needed.   FLUoxetine 10 MG capsule Commonly known as: PROZAC Take 1 capsule (10 mg total) by mouth daily.   hydrALAZINE 25 MG tablet Commonly known as: APRESOLINE Take 1 tablet (25 mg total) by mouth 3 (three) times daily.   metFORMIN 500 MG tablet Commonly known as: GLUCOPHAGE Take 0.5 tablets (250 mg total) by mouth daily with breakfast.   OMEGA-3 FATTY ACIDS PO Take 2 Gm capsule  by mouth twice daily   pantoprazole 40 MG tablet Commonly known as: PROTONIX Take 1 tablet (40 mg total) by mouth 2 (two) times daily.       Review of Systems  Constitutional: Negative for appetite change, chills, fatigue and fever.  HENT: Negative for congestion, postnasal drip, rhinorrhea, sinus pressure, sinus pain, sneezing and sore throat.   Eyes: Negative for pain, discharge, redness, itching and visual disturbance.  Respiratory: Negative for cough, chest tightness, shortness of breath and wheezing.   Cardiovascular: Negative for chest pain, palpitations and leg swelling.  Gastrointestinal: Negative for abdominal distention, abdominal pain, constipation, diarrhea, nausea and vomiting.  Endocrine: Negative for cold intolerance, heat intolerance, polydipsia, polyphagia and polyuria.  Genitourinary: Negative for difficulty urinating, dysuria, flank pain, frequency and urgency.  Musculoskeletal: Positive for arthralgias and gait problem. Negative for myalgias.       Left arm pain 9/10 on scale   Skin: Negative for color change, pallor, rash and wound.  Neurological: Negative for dizziness, speech difficulty, weakness, light-headedness, numbness and headaches.  Hematological: Does not bruise/bleed easily.  Psychiatric/Behavioral: Negative for agitation and confusion. The patient is not nervous/anxious.        Does sleep at night but takes naps during the day     Immunization History  Administered  Date(s) Administered  . Influenza,inj,Quad PF,6+ Mos 08/10/2015, 09/22/2016, 07/29/2017, 11/27/2018, 09/28/2019  . Pneumococcal Conjugate-13 01/16/2019  . Pneumococcal Polysaccharide-23 12/10/2016  . Tdap 01/16/2019   Pertinent  Health Maintenance Due  Topic Date Due  . OPHTHALMOLOGY EXAM  Never done  . FOOT EXAM  01/12/2019  . HEMOGLOBIN A1C  07/23/2020  . COLONOSCOPY  03/24/2023  . INFLUENZA VACCINE  Completed   Fall Risk  01/23/2020 01/17/2020 10/09/2019 04/24/2019 02/22/2019  Falls in the past year? 0 0 0 0 0  Number falls in past yr: 0 0 - 0 0  Injury with Fall? 0 0 - 0 0  Risk for fall due to : - - - - -     Vitals:   01/23/20 1032 01/23/20 1145  BP: (!) 160/100 (!) 152/88  Pulse: 67   Temp: 97.9 F (36.6 C)   TempSrc: Tympanic   SpO2: 98%   Weight: 294 lb (133.4 kg)   Height: 6' 2"  (1.88 m)    Body mass index is 37.75 kg/m. Physical Exam Constitutional:      General: He is not in  acute distress.    Appearance: He is obese. He is not ill-appearing.  HENT:     Head: Normocephalic.     Right Ear: Tympanic membrane, ear canal and external ear normal. There is no impacted cerumen.     Left Ear: Tympanic membrane, ear canal and external ear normal. There is no impacted cerumen.     Nose: Nose normal. No congestion or rhinorrhea.     Mouth/Throat:     Mouth: Mucous membranes are moist.     Pharynx: Oropharynx is clear. No oropharyngeal exudate or posterior oropharyngeal erythema.  Eyes:     General: No scleral icterus.       Right eye: No discharge.        Left eye: No discharge.     Extraocular Movements: Extraocular movements intact.     Conjunctiva/sclera: Conjunctivae normal.     Pupils: Pupils are equal, round, and reactive to light.  Neck:     Vascular: No carotid bruit.  Cardiovascular:     Rate and Rhythm: Normal rate and regular rhythm.     Pulses: Normal pulses.     Heart sounds: Normal heart sounds. No murmur. No friction rub. No gallop.   Pulmonary:      Effort: Pulmonary effort is normal. No respiratory distress.     Breath sounds: Normal breath sounds. No wheezing, rhonchi or rales.  Chest:     Chest wall: No tenderness.  Abdominal:     General: Bowel sounds are normal. There is no distension.     Palpations: Abdomen is soft. There is no mass.     Tenderness: There is no abdominal tenderness. There is no right CVA tenderness, left CVA tenderness or guarding.  Musculoskeletal:        General: No swelling or tenderness.     Cervical back: Normal range of motion. No rigidity or tenderness.     Right lower leg: No edema.     Left lower leg: No edema.     Comments: Unsteady gait uses cane at home but didn't bring to visit   Lymphadenopathy:     Cervical: No cervical adenopathy.  Skin:    General: Skin is warm.     Coloration: Skin is not pale.     Findings: No bruising, erythema, lesion or rash.     Comments: Bilateral overgrown toenails  Neurological:     Mental Status: He is oriented to person, place, and time.     Cranial Nerves: No cranial nerve deficit.     Coordination: Coordination normal.     Gait: Gait abnormal.     Comments: Left hemiparesis   Psychiatric:        Mood and Affect: Mood normal.        Behavior: Behavior normal.        Thought Content: Thought content normal.        Judgment: Judgment normal.     Labs reviewed: Recent Labs    09/28/19 1534 01/21/20 1011  NA 141 140  K 4.3 4.5  CL 103 104  CO2 23 27  GLUCOSE 133* 123*  BUN 16 23  CREATININE 1.62* 1.20  CALCIUM 9.8 9.5   Recent Labs    09/28/19 1534 01/21/20 1011  AST 22 21  ALT 31 25  BILITOT 0.5 0.4  PROT 8.0 7.5   Recent Labs    09/28/19 1534 01/21/20 1011  WBC 5.6 5.0  NEUTROABS 3,091 2,815  HGB 15.0 14.3  HCT 44.7 43.0  MCV 87.0 88.3  PLT 164 125*   Lab Results  Component Value Date   TSH 2.86 01/21/2020   Lab Results  Component Value Date   HGBA1C 6.5 (H) 01/21/2020   Lab Results  Component Value Date   CHOL  157 01/21/2020   HDL 42 01/21/2020   LDLCALC 83 01/21/2020   TRIG 227 (H) 01/21/2020   CHOLHDL 3.7 01/21/2020    Significant Diagnostic Results in last 30 days:  No results found.  Assessment/Plan  1. Essential hypertension B/p elevated this visit though did not take his medication prior to visit.Clonidine administered during visit B/p trending down.Advised to take medication as directed. - continue on clonidine 0.1 mg tablet every 8 Hrs PRN,amlodipine 10 mg tablet daily and Hydralazine 25 mg tablet three times daily.On ASA and Statin for cardiovascular protection.  -Advised to avoid adding extra salt to diet  - Ambulatory referral to diabetic education - cloNIDine (CATAPRES) tablet 0.1 mg given by CMA. - CBC with Differential/Platelet; Future - CMP with eGFR([facility]); Future  2. Type 2 diabetes mellitus without complication, without long-term current use of insulin (HCC) Lab Results  Component Value Date   HGBA1C 6.5 (H) 01/21/2020  - continue on metformin 250 mg tablet daily. - Additional Education information on DASH diet provided on AVS - Ambulatory referral to diabetic education - Ambulatory referral to Ophthalmology for annual eye exam. -  Ambulatory referral to Podiatry for annual diabetic foot exam.  - TSH; Future - Hemoglobin A1c; Future  3. Gastroesophageal reflux disease without esophagitis Symptoms under control.Continue on Protonix 40 mg tablet twice daily. - CBC with Differential/Platelet; Future  4. Depression, unspecified depression type Mood stable.continue on Fluoxetine 10 mg tablet daily and Wellbutrin SR 150 mg 12Hr Tablet twice daily. - CBC with Differential/Platelet; Future  5. Hemiparesis affecting left side as late effect of stroke ([place]) Reports left hand pain.encouraged to take OTC extra strength Tylenol 1000 mg tablet twice daily and 500 mg tablet at 2 pm as needed   6. Dyslipidemia associated with type 2 diabetes mellitus ([place]) LDL normal but TRG  elevated 227 daughter request referral for assistance dietary advise.  - Ambulatory referral to diabetic education - encouraged to increase physical activity as tolerated. - Additional Education information on DASH diet provided on AVS - Lipid panel; Future  7. Morbid obesity (HCC) BMI 37.75 dietary modification  and exercise advised. - Ambulatory referral to diabetic education - TSH; Future  8. Tobacco abuse Smoking 1 packs per days.continue on Wellbutrin SR 150 mg 12Hr Tablet twice daily.  9. Overgrown toenails Bilateral overgrown toenails.  - Ambulatory referral to Podiatry  10. BMI 37.0-37.9, adult Dietary modification and exercise as above.  Family/ staff Communication: Reviewed plan of care with patient and daughter.  Labs/tests ordered:  - CBC with Differential/Platelet; Future - CMP with eGFR([facility]); Future - TSH; Future - Hemoglobin A1c; Future - Lipid panel; Future  Next Appointment : 6 months for medical management of chronic issues.labs 2-4 days prior to visit.   Sandrea Hughs, NP

## 2020-01-23 NOTE — Patient Instructions (Addendum)
- smoking cessation - referral to podiatrist to trim toenails ordered today.specialist office will call you  - Referral to Ophthalmologist ordered today due to Type 2 diabetes.specialist will call you.  - Referral placed today for Nutritionist due to high Triglycerides,weight,Hypertension and type 2 diabetes.    DASH Eating Plan DASH stands for "Dietary Approaches to Stop Hypertension." The DASH eating plan is a healthy eating plan that has been shown to reduce high blood pressure (hypertension). It may also reduce your risk for type 2 diabetes, heart disease, and stroke. The DASH eating plan may also help with weight loss. What are tips for following this plan?  General guidelines  Avoid eating more than 2,300 mg (milligrams) of salt (sodium) a day. If you have hypertension, you may need to reduce your sodium intake to 1,500 mg a day.  Limit alcohol intake to no more than 1 drink a day for nonpregnant women and 2 drinks a day for men. One drink equals 12 oz of beer, 5 oz of wine, or 1 oz of hard liquor.  Work with your health care provider to maintain a healthy body weight or to lose weight. Ask what an ideal weight is for you.  Get at least 30 minutes of exercise that causes your heart to beat faster (aerobic exercise) most days of the week. Activities may include walking, swimming, or biking.  Work with your health care provider or diet and nutrition specialist (dietitian) to adjust your eating plan to your individual calorie needs. Reading food labels   Check food labels for the amount of sodium per serving. Choose foods with less than 5 percent of the Daily Value of sodium. Generally, foods with less than 300 mg of sodium per serving fit into this eating plan.  To find whole grains, look for the word "whole" as the first word in the ingredient list. Shopping  Buy products labeled as "low-sodium" or "no salt added."  Buy fresh foods. Avoid canned foods and premade or frozen  meals. Cooking  Avoid adding salt when cooking. Use salt-free seasonings or herbs instead of table salt or sea salt. Check with your health care provider or pharmacist before using salt substitutes.  Do not fry foods. Cook foods using healthy methods such as baking, boiling, grilling, and broiling instead.  Cook with heart-healthy oils, such as olive, canola, soybean, or sunflower oil. Meal planning  Eat a balanced diet that includes: ? 5 or more servings of fruits and vegetables each day. At each meal, try to fill half of your plate with fruits and vegetables. ? Up to 6-8 servings of whole grains each day. ? Less than 6 oz of lean meat, poultry, or fish each day. A 3-oz serving of meat is about the same size as a deck of cards. One egg equals 1 oz. ? 2 servings of low-fat dairy each day. ? A serving of nuts, seeds, or beans 5 times each week. ? Heart-healthy fats. Healthy fats called Omega-3 fatty acids are found in foods such as flaxseeds and coldwater fish, like sardines, salmon, and mackerel.  Limit how much you eat of the following: ? Canned or prepackaged foods. ? Food that is high in trans fat, such as fried foods. ? Food that is high in saturated fat, such as fatty meat. ? Sweets, desserts, sugary drinks, and other foods with added sugar. ? Full-fat dairy products.  Do not salt foods before eating.  Try to eat at least 2 vegetarian meals each week.  Eat more home-cooked food and less restaurant, buffet, and fast food.  When eating at a restaurant, ask that your food be prepared with less salt or no salt, if possible. What foods are recommended? The items listed may not be a complete list. Talk with your dietitian about what dietary choices are best for you. Grains Whole-grain or whole-wheat bread. Whole-grain or whole-wheat pasta. Brown rice. [place]. Bulgur. Whole-grain and low-sodium cereals. Pita bread. Low-fat, low-sodium crackers. Whole-wheat flour  tortillas. Vegetables Fresh or frozen vegetables (raw, steamed, roasted, or grilled). Low-sodium or reduced-sodium tomato and vegetable juice. Low-sodium or reduced-sodium tomato sauce and tomato paste. Low-sodium or reduced-sodium canned vegetables. Fruits All fresh, dried, or frozen fruit. Canned fruit in natural juice (without added sugar). Meat and other protein foods Skinless chicken or [place]. Ground chicken or [place]. Pork with fat trimmed off. Fish and seafood. Egg whites. Dried beans, peas, or lentils. Unsalted nuts, nut butters, and seeds. Unsalted canned beans. Lean cuts of beef with fat trimmed off. Low-sodium, lean deli meat. Dairy Low-fat (1%) or fat-free (skim) milk. Fat-free, low-fat, or reduced-fat cheeses. Nonfat, low-sodium ricotta or cottage cheese. Low-fat or nonfat yogurt. Low-fat, low-sodium cheese. Fats and oils Soft margarine without trans fats. Vegetable oil. Low-fat, reduced-fat, or light mayonnaise and salad dressings (reduced-sodium). Canola, safflower, olive, soybean, and sunflower oils. Avocado. Seasoning and other foods Herbs. Spices. Seasoning mixes without salt. Unsalted popcorn and pretzels. Fat-free sweets. What foods are not recommended? The items listed may not be a complete list. Talk with your dietitian about what dietary choices are best for you. Grains Baked goods made with fat, such as croissants, muffins, or some breads. Dry pasta or rice meal packs. Vegetables Creamed or fried vegetables. Vegetables in a cheese sauce. Regular canned vegetables (not low-sodium or reduced-sodium). Regular canned tomato sauce and paste (not low-sodium or reduced-sodium). Regular tomato and vegetable juice (not low-sodium or reduced-sodium). Angie Fava. Olives. Fruits Canned fruit in a light or heavy syrup. Fried fruit. Fruit in cream or butter sauce. Meat and other protein foods Fatty cuts of meat. Ribs. Fried meat. Berniece Salines. Sausage. Bologna and other processed lunch meats.  Salami. Fatback. Hotdogs. Bratwurst. Salted nuts and seeds. Canned beans with added salt. Canned or smoked fish. Whole eggs or egg yolks. Chicken or [place] with skin. Dairy Whole or 2% milk, cream, and half-and-half. Whole or full-fat cream cheese. Whole-fat or sweetened yogurt. Full-fat cheese. Nondairy creamers. Whipped toppings. Processed cheese and cheese spreads. Fats and oils Butter. Stick margarine. Lard. Shortening. Ghee. Bacon fat. Tropical oils, such as coconut, palm kernel, or palm oil. Seasoning and other foods Salted popcorn and pretzels. Onion salt, garlic salt, seasoned salt, table salt, and sea salt. Worcestershire sauce. Tartar sauce. Barbecue sauce. Teriyaki sauce. Soy sauce, including reduced-sodium. Steak sauce. Canned and packaged gravies. Fish sauce. Oyster sauce. Cocktail sauce. Horseradish that you find on the shelf. Ketchup. Mustard. Meat flavorings and tenderizers. Bouillon cubes. Hot sauce and Tabasco sauce. Premade or packaged marinades. Premade or packaged taco seasonings. Relishes. Regular salad dressings. Where to find more information:  National Heart, Lung, and [place]: https://wilson-eaton.com/  American Heart Association: www.heart.org Summary  The DASH eating plan is a healthy eating plan that has been shown to reduce high blood pressure (hypertension). It may also reduce your risk for type 2 diabetes, heart disease, and stroke.  With the DASH eating plan, you should limit salt (sodium) intake to 2,300 mg a day. If you have hypertension, you may need to reduce your sodium intake  to 1,500 mg a day.  When on the DASH eating plan, aim to eat more fresh fruits and vegetables, whole grains, lean proteins, low-fat dairy, and heart-healthy fats.  Work with your health care provider or diet and nutrition specialist (dietitian) to adjust your eating plan to your individual calorie needs. This information is not intended to replace advice given to you by your health  care provider. Make sure you discuss any questions you have with your health care provider. Document Revised: 10/07/2017 Document Reviewed: 10/18/2016 Elsevier Patient Education  [place].  Smoking Tobacco Information, Adult Smoking tobacco can be harmful to your health. Tobacco contains a poisonous (toxic), colorless chemical called nicotine. Nicotine is addictive. It changes the brain and can make it hard to stop smoking. Tobacco also has other toxic chemicals that can hurt your body and raise your risk of many cancers. How can smoking tobacco affect me? Smoking tobacco puts you at risk for:  Cancer. Smoking is most commonly associated with lung cancer, but can also lead to cancer in other parts of the body.  Chronic obstructive pulmonary disease (COPD). This is a long-term lung condition that makes it hard to breathe. It also gets worse over time.  High blood pressure (hypertension), heart disease, stroke, or heart attack.  Lung infections, such as pneumonia.  Cataracts. This is when the lenses in the eyes become clouded.  Digestive problems. This may include peptic ulcers, heartburn, and gastroesophageal reflux disease (GERD).  Oral health problems, such as gum disease and tooth loss.  Loss of taste and smell. Smoking can affect your appearance by causing:  Wrinkles.  Yellow or stained teeth, fingers, and fingernails. Smoking tobacco can also affect your social life, because:  It may be challenging to find places to smoke when away from home. Many workplaces, [employer], hotels, and public places are tobacco-free.  Smoking is expensive. This is due to the cost of tobacco and the long-term costs of treating health problems from smoking.  Secondhand smoke may affect those around you. Secondhand smoke can cause lung cancer, breathing problems, and heart disease. Children of smokers have a higher risk for: ? Sudden infant death syndrome (SIDS). ? Ear  infections. ? Lung infections. If you currently smoke tobacco, quitting now can help you:  Lead a longer and healthier life.  Look, smell, breathe, and feel better over time.  Save money.  Protect others from the harms of secondhand smoke. What actions can I take to prevent health problems? Quit smoking   Do not start smoking. Quit if you already do.  Make a plan to quit smoking and commit to it. Look for programs to help you and ask your health care provider for recommendations and ideas.  Set a date and write down all the reasons you want to quit.  Let your friends and family know you are quitting so they can help and support you. Consider finding friends who also want to quit. It can be easier to quit with someone else, so that you can support each other.  Talk with your health care provider about using nicotine replacement medicines to help you quit, such as gum, lozenges, patches, sprays, or pills.  Do not replace cigarette smoking with electronic cigarettes, which are commonly called e-cigarettes. The safety of e-cigarettes is not known, and some may contain harmful chemicals.  If you try to quit but return to smoking, stay positive. It is common to slip up when you first quit, so take it one day  at a time.  Be prepared for cravings. When you feel the urge to smoke, chew gum or suck on hard candy. Lifestyle  Stay busy and take care of your body.  Drink enough fluid to keep your urine pale yellow.  Get plenty of exercise and eat a healthy diet. This can help prevent weight gain after quitting.  Monitor your eating habits. Quitting smoking can cause you to have a larger appetite than when you smoke.  Find ways to relax. Go out with friends or family to a movie or a restaurant where people do not smoke.  Ask your health care provider about having regular tests (screenings) to check for cancer. This may include blood tests, imaging tests, and other tests.  Find ways to  manage your stress, such as meditation, yoga, or exercise. Where to find support To get support to quit smoking, consider:  Asking your health care provider for more information and resources.  Taking classes to learn more about quitting smoking.  Looking for local organizations that offer resources about quitting smoking.  Joining a support group for people who want to quit smoking in your local community.  Calling the smokefree.gov counselor helpline: 1-800-Quit-Now 587-345-1014) Where to find more information You may find more information about quitting smoking from:  HelpGuide.org: www.helpguide.org  https://Tal[place].com/: smokefree.gov  American Lung Association: www.lung.org Contact a health care provider if you:  Have problems breathing.  Notice that your lips, nose, or fingers turn blue.  Have chest pain.  Are coughing up blood.  Feel faint or you pass out.  Have other health changes that cause you to worry. Summary  Smoking tobacco can negatively affect your health, the health of those around you, your finances, and your social life.  Do not start smoking. Quit if you already do. If you need help quitting, ask your health care provider.  Think about joining a support group for people who want to quit smoking in your local community. There are many effective programs that will help you to quit this behavior. This information is not intended to replace advice given to you by your health care provider. Make sure you discuss any questions you have with your health care provider. Document Revised: 07/20/2019 Document Reviewed: 11/09/2016 Elsevier Patient Education  2020 [employer].

## 2020-01-25 ENCOUNTER — Ambulatory Visit: Payer: Medicare HMO | Admitting: Family

## 2020-02-01 ENCOUNTER — Other Ambulatory Visit: Payer: Medicare HMO

## 2020-02-25 ENCOUNTER — Ambulatory Visit: Payer: Medicare HMO | Admitting: Registered"

## 2020-03-07 ENCOUNTER — Encounter: Payer: Self-pay | Admitting: Registered"

## 2020-03-07 ENCOUNTER — Encounter: Payer: Medicare HMO | Attending: Family | Admitting: Registered"

## 2020-03-07 DIAGNOSIS — E1149 Type 2 diabetes mellitus with other diabetic neurological complication: Secondary | ICD-10-CM | POA: Insufficient documentation

## 2020-03-07 NOTE — Progress Notes (Signed)
Diabetes Self-Management Education  Visit Type: First/Initial   Appointment completed via telephone. Pt identified himself by full name and date of birth.  Appt. Start Time: 0800 Appt. End Time: 0830  03/07/2020  [name], identified by name and date of birth, is a 59 y.o. male with a diagnosis of Diabetes: Type 2.   ASSESSMENT  There were no vitals taken for this visit. There is no height or weight on file to calculate BMI.   Patient stated he was not aware of the reason for the appointment this appointment, but states he is interested in learning if how he is eating will help his diabetes and high blood pressure.   Pt reports he quit smoking last Wednesday.  Pt states he does not eat salt, rice, mac and cheese or other pastas. Pt states he eats red-skinned potatoes instead of white potatoes.  Pt reports vegetable intake:  (Starchy) corn, potatoes, peas,  (non-starchy) string beans, broccoli, tomatoes, and collard greens, but only every other Sunday.  Patient is not aware of what are starchy vegetables other than "white" potatoes. Per dietary history pt is drinking about 16 oz of sweetened beverages. RD did not address this visit. A1c is pretty well controlled and blood pressure may be a better priority to start with.  Pt reports he has been taking OTC sleep aid for 2 years and states he is able to sleep fine using them. Pt denies sleep apnea.  Pt states morning phone visits work well for him.   Diabetes Self-Management Education - 03/07/20 0808      Visit Information   Visit Type  First/Initial      Initial Visit   Diabetes Type  Type 2    Are you currently following a meal plan?  Yes   no rice, pasta, or white potatoes (eats red skinned potatoes)   Are you taking your medications as prescribed?  Yes   250 mg metformin     Health Coping   How would you rate your overall health?  Good      Psychosocial Assessment   Patient Belief/Attitude about Diabetes   Motivated to manage diabetes    How often do you need to have someone help you when you read instructions, pamphlets, or other written materials from your doctor or pharmacy?  2 - Rarely    What is the last grade level you completed in school?  11      Complications   Last HgB A1C per patient/outside source  6.5 %   patient does not understand A1c test   How often do you check your blood sugar?  0 times/day (not testing)    Have you had a dilated eye exam in the past 12 months?  No    Have you had a dental exam in the past 12 months?  No    Are you checking your feet?  Yes    How many days per week are you checking your feet?  1   after shower     Dietary Intake   Breakfast  honey nut cheerios, water    Snack (morning)  no    Lunch  chicken, red potato, regular lemonade or koolaide    Snack (afternoon)  none "no sweets"    Dinner  hamburger, broccoli cheese,    Snack (evening)  cheese cracker    Beverage(s)  water -2 bottles, lemonade, koolaide -2 small glasses      Exercise   Exercise Type  Light (walking / raking leaves)   walks to the mailbox 1x/week   How many days per week to you exercise?  1    How many minutes per day do you exercise?  5    Total minutes per week of exercise  5      Patient Education   Previous Diabetes Education  No    Nutrition management   Other (comment)   starchy vs non-starchy vegetables     Individualized Goals (developed by patient)   Nutrition  Other (comment)   increase water and vegetable intake, cook more often at home   Reducing Risk  stop smoking   pt reports he stopped smoking last Wed, 03/05/20     Outcomes   Expected Outcomes  Demonstrated interest in learning. Expect positive outcomes    Future DMSE  4-6 wks    Program Status  Not Completed       Individualized Plan for Diabetes Self-Management Training:   Learning Objective:  Patient will have a greater understanding of diabetes self-management. Patient education plan is to  attend individual and/or group sessions per assessed needs and concerns.  Patient Instructions  Great start with stopping your smoking habit! If you need additional support to keep from smoking you can call the [place] quit line: Quitline[place] - Get free tobacco cessation help 24/7: 1-800-QUIT-NOW 647-222-3392)  Your last A1c was 6.5% which is in the diabetes range, but considered well controlled. To keep your blood sugar controlled, continue to eat balanced meals without adding too much starchy foods.  Some specific goals we talked about today: 1) Continue reducing salt in your diet: Cook meals at home as often as possible using your low-sodium seasonings such as lemon pepper Continue to avoid mac and cheese and use cheese in moderation in general.  2) Drink one more bottle of water each day  3) Eat non-starchy vegetables every day. A few you mentioned that you enjoy are: string beans, broccoli, tomatoes, and collard greens.  The following is a list of common non-starchy vegetables:  Artichoke Artichoke hearts (bottled and seasoned may be high in sodium, read the label) Asparagus Baby corn Bamboo shoots Beans (green, wax, [place]) Bean sprouts Beets Brussels sprouts Broccoli Cabbage (green, bok choy, [place]) Carrots Cauliflower Celery Cucumber Eggplant Greens (collard, kale, mustard, turnip) Hearts of palm Mushrooms Okra Onions Pea pods Peppers Radishes [place] Salad greens (chicory, endive, escarole, lettuce, romaine, spinach, arugula, radicchio, watercress) Sprouts Squash (cushaw, summer, crookneck, spaghetti, zucchini) Sugar snap peas Swiss chard Tomato Turnips   Expected Outcomes:  Demonstrated interest in learning. Expect positive outcomes  Education material provided: none  If problems or questions, patient to contact team via:  Phone  Future DSME appointment: 4-6 wks

## 2020-03-07 NOTE — Patient Instructions (Addendum)
Great start with stopping your smoking habit! If you need additional support to keep from smoking you can call the [place] quit line: [facility]uitline[place] - Get free tobacco cessation help 24/7: 1-800-QUIT-NOW 706-859-5149)  Your last A1c was 6.5% which is in the diabetes range, but considered well controlled. To keep your blood sugar controlled, continue to eat balanced meals without adding too much starchy foods.  Some specific goals we talked about today: 1) Continue reducing salt in your diet: Cook meals at home as often as possible using your low-sodium seasonings such as lemon pepper Continue to avoid mac and cheese and use cheese in moderation in general.  2) Drink one more bottle of water each day  3) Eat non-starchy vegetables every day. A few you mentioned that you enjoy are: string beans, broccoli, tomatoes, and collard greens.  The following is a list of common non-starchy vegetables:  Artichoke Artichoke hearts (bottled and seasoned may be high in sodium, read the label) Asparagus Baby corn Bamboo shoots Beans (green, wax, [place]) Bean sprouts Beets Brussels sprouts Broccoli Cabbage (green, bok choy, [place]) Carrots Cauliflower Celery Cucumber Eggplant Greens (collard, kale, mustard, turnip) Hearts of palm Mushrooms Okra Onions Pea pods Peppers Radishes [place] Salad greens (chicory, endive, escarole, lettuce, romaine, spinach, arugula, radicchio, watercress) Sprouts Squash (cushaw, summer, crookneck, spaghetti, zucchini) Sugar snap peas Swiss chard Tomato Turnips

## 2020-03-17 ENCOUNTER — Other Ambulatory Visit: Payer: Self-pay | Admitting: Family

## 2020-03-17 DIAGNOSIS — K219 Gastro-esophageal reflux disease without esophagitis: Secondary | ICD-10-CM

## 2020-04-04 ENCOUNTER — Other Ambulatory Visit: Payer: Self-pay | Admitting: Family

## 2020-04-04 DIAGNOSIS — I10 Essential (primary) hypertension: Secondary | ICD-10-CM

## 2020-04-04 NOTE — Telephone Encounter (Signed)
Erx Received from Pharmacy.  Pended Rx and sent to [facility] for approval due to [place].

## 2020-04-18 ENCOUNTER — Encounter: Payer: Self-pay | Admitting: Registered"

## 2020-04-18 ENCOUNTER — Encounter: Payer: Medicare HMO | Attending: Family | Admitting: Registered"

## 2020-04-18 DIAGNOSIS — E1149 Type 2 diabetes mellitus with other diabetic neurological complication: Secondary | ICD-10-CM | POA: Diagnosis not present

## 2020-04-18 NOTE — Patient Instructions (Signed)
Eat vegetables every day. Your goal is collard greens every Sunday and either broccoli or Brussels sprouts every day. Get at least 5 min of movement in every day. Can use cans of food and move your arms. Instead of 1 x/week going out to check the mail, go 2x/week.

## 2020-04-18 NOTE — Progress Notes (Signed)
Diabetes Self-Management Education  Visit Type: Follow-up  Appt. Start Time: 0800 Appt. End Time: 0825  04/18/2020  [name], identified by name and date of birth, is a 59 y.o. male with a diagnosis of Diabetes: Type 2.   ASSESSMENT  There were no vitals taken for this visit. There is no height or weight on file to calculate BMI.   Patient is not able to get into office, requires telephone visits. Patient does not have MyChart.  Patient has been able to continue with smoking cessation. Pt reports No changes made since last visit. RD encouraged more movement and vegetable intake.    Diabetes Self-Management Education - 04/18/20 5809      Visit Information   Visit Type Follow-up      Initial Visit   Diabetes Type Type 2      Complications   How often do you check your blood sugar? 0 times/day (not testing)      Dietary Intake   Breakfast [place] Cheerios    Snack (morning) no    Lunch bologna, cheese sandwich    Snack (afternoon) none    Dinner fish sticks    Snack (evening) fruit    Beverage(s) water, 3 bottles, koolaide      Exercise   Exercise Type Light (walking / raking leaves)   walks to mailbox 1x/week   How many days per week to you exercise? 1    How many minutes per day do you exercise? 5    Total minutes per week of exercise 5      Patient Education   Nutrition management  Other (comment)   importance of vegetables   Physical activity and exercise  Helped patient identify appropriate exercises in relation to his/her diabetes, diabetes complications and other health issue.      Individualized Goals (developed by patient)   Nutrition Other (comment)   eat vegetables daily (broccoli, Brussel Sprouts, Collard greens)   Physical Activity Exercise 1-2 times per week   Walk to mailbox 2x/week -thurs & Sat; 5 min daily lifting cans of food   Medications take my medication as prescribed      Outcomes   Expected Outcomes Demonstrated interest in learning.  Expect positive outcomes    Future DMSE 4-6 wks    Program Status Not Completed      Subsequent Visit   Since your last visit have you continued or begun to take your medications as prescribed? Yes   metformin 250 mg   Since your last visit, are you checking your blood glucose at least once a day? No           Individualized Plan for Diabetes Self-Management Training:   Learning Objective:  Patient will have a greater understanding of diabetes self-management. Patient education plan is to attend individual and/or group sessions per assessed needs and concerns.   Patient Instructions  Eat vegetables every day. Your goal is collard greens every Sunday and either broccoli or Brussels sprouts every day. Get at least 5 min of movement in every day. Can use cans of food and move your arms. Instead of 1 x/week going out to check the mail, go 2x/week.   Expected Outcomes:  Demonstrated interest in learning. Expect positive outcomes  Education material provided: none  If problems or questions, patient to contact team via:  Phone  Future DSME appointment: 4-6 wks

## 2020-05-22 ENCOUNTER — Other Ambulatory Visit: Payer: Self-pay

## 2020-05-22 ENCOUNTER — Other Ambulatory Visit: Payer: Medicare HMO

## 2020-05-22 DIAGNOSIS — E119 Type 2 diabetes mellitus without complications: Secondary | ICD-10-CM

## 2020-05-22 DIAGNOSIS — I10 Essential (primary) hypertension: Secondary | ICD-10-CM | POA: Diagnosis not present

## 2020-05-22 DIAGNOSIS — E1169 Type 2 diabetes mellitus with other specified complication: Secondary | ICD-10-CM | POA: Diagnosis not present

## 2020-05-22 DIAGNOSIS — E785 Hyperlipidemia, unspecified: Secondary | ICD-10-CM

## 2020-05-22 DIAGNOSIS — F329 Major depressive disorder, single episode, unspecified: Secondary | ICD-10-CM | POA: Diagnosis not present

## 2020-05-22 DIAGNOSIS — K219 Gastro-esophageal reflux disease without esophagitis: Secondary | ICD-10-CM

## 2020-05-22 DIAGNOSIS — F32A Depression, unspecified: Secondary | ICD-10-CM

## 2020-05-23 ENCOUNTER — Encounter: Payer: Self-pay | Admitting: Registered"

## 2020-05-23 ENCOUNTER — Encounter: Payer: Medicare HMO | Attending: Family | Admitting: Registered"

## 2020-05-23 DIAGNOSIS — E1149 Type 2 diabetes mellitus with other diabetic neurological complication: Secondary | ICD-10-CM | POA: Insufficient documentation

## 2020-05-23 LAB — LIPID PANEL
Cholesterol: 146 mg/dL (ref <200)
HDL: 43 mg/dL (ref >=40)
LDL Cholesterol (Calc): 75 mg/dL (calc)
Non-HDL Cholesterol (Calc): 103 mg/dL (calc) (ref <130)
Total CHOL/HDL Ratio: 3.4 (calc) (ref <5.0)
Triglycerides: 179 mg/dL — ABNORMAL HIGH (ref <150)

## 2020-05-23 LAB — HEMOGLOBIN A1C
Hgb A1c MFr Bld: 6.9 % of total Hgb — ABNORMAL HIGH (ref <5.7)
Mean Plasma Glucose: 151 (calc)
eAG (mmol/L): 8.4 (calc)

## 2020-05-23 LAB — CBC WITH DIFFERENTIAL/PLATELET
Absolute Monocytes: 655 cells/uL (ref 200–950)
Basophils Absolute: 17 cells/uL (ref 0–200)
Basophils Relative: 0.3 %
Eosinophils Absolute: 418 cells/uL (ref 15–500)
Eosinophils Relative: 7.2 %
HCT: 41.5 % (ref 38.5–50.0)
Hemoglobin: 13.8 g/dL (ref 13.2–17.1)
Lymphs Abs: 1769 cells/uL (ref 850–3900)
MCH: 29.2 pg (ref 27.0–33.0)
MCHC: 33.3 g/dL (ref 32.0–36.0)
MCV: 87.7 fL (ref 80.0–100.0)
MPV: 12.7 fL — ABNORMAL HIGH (ref 7.5–12.5)
Monocytes Relative: 11.3 %
Neutro Abs: 2941 cells/uL (ref 1500–7800)
Neutrophils Relative %: 50.7 %
Platelets: 132 10*3/uL — ABNORMAL LOW (ref 140–400)
RBC: 4.73 10*6/uL (ref 4.20–5.80)
RDW: 14.5 % (ref 11.0–15.0)
Total Lymphocyte: 30.5 %
WBC: 5.8 10*3/uL (ref 3.8–10.8)

## 2020-05-23 LAB — COMPLETE METABOLIC PANEL WITH GFR
AG Ratio: 1.4 (calc) (ref 1.0–2.5)
ALT: 21 U/L (ref 9–46)
AST: 19 U/L (ref 10–35)
Albumin: 4.4 g/dL (ref 3.6–5.1)
Alkaline phosphatase (APISO): 104 U/L (ref 35–144)
BUN/Creatinine Ratio: 21 (calc) (ref 6–22)
BUN: 28 mg/dL — ABNORMAL HIGH (ref 7–25)
CO2: 20 mmol/L (ref 20–32)
Calcium: 9.9 mg/dL (ref 8.6–10.3)
Chloride: 105 mmol/L (ref 98–110)
Creat: 1.34 mg/dL — ABNORMAL HIGH (ref 0.70–1.25)
GFR, Est African American: 66 mL/min/{1.73_m2} (ref >=60)
GFR, Est Non African American: 57 mL/min/{1.73_m2} — ABNORMAL LOW (ref >=60)
Globulin: 3.2 g/dL (calc) (ref 1.9–3.7)
Glucose, Bld: 109 mg/dL — ABNORMAL HIGH (ref 65–99)
Potassium: 4.3 mmol/L (ref 3.5–5.3)
Sodium: 139 mmol/L (ref 135–146)
Total Bilirubin: 0.4 mg/dL (ref 0.2–1.2)
Total Protein: 7.6 g/dL (ref 6.1–8.1)

## 2020-05-23 LAB — TSH: TSH: 2.62 mIU/L (ref 0.40–4.50)

## 2020-05-23 NOTE — Patient Instructions (Addendum)
Work on Academic librarian intake and continue to avoid drinking beverages with sugar. Consider doing some of the Armchair exercises for more movement Consider getting a tongue scraper to help get the bad taste out of mouth in morning. May also want to talk to your doctor about this as well.

## 2020-05-23 NOTE — Progress Notes (Signed)
Diabetes Self-Management Education  Visit Type: Follow-up  Appt. Start Time: 0930 Appt. End Time: 0945 05/23/2020  [name], identified by name and date of birth, is a 59 y.o. male with a diagnosis of Diabetes: Type 2.   Patient is not able to get into office, requires telephone visits. Patient does not have MyChart.  ASSESSMENT  There were no vitals taken for this visit. There is no height or weight on file to calculate BMI.   Pt reports he went to the doctor yesterday and had a blood draw and his weight was 310 lbs.  Pt states he has a bad taste in his mouth in the morning and sucks on a peppermint candy and wanted to know if it was okay to have sweets. Patient's dietary recall was close to the same as last visit except pt reports that he did not drink koolaide yesterday but only had 2 bottles of water. Pt states reason for limited water intake is that he doesn't want to have to get up to use the bathroom.  Pt states he goes to bed after eating.   RD encouraged patient to get more movement, will send Arm chair exercise in the mail to him.   Diabetes Self-Management Education - 05/23/20 1357      Visit Information   Visit Type Follow-up      Initial Visit   Diabetes Type Type 2      Dietary Intake   Breakfast honeynut cheerios, banana    Lunch bologna and cheese sandwich    Dinner chicken, cabbage    Snack (evening) PB crackers    Beverage(s) 2 bottles of water      Patient Education   Nutrition management  Other (comment)   importance of water   Physical activity and exercise  Role of exercise on diabetes management, blood pressure control and cardiac health.      Outcomes   Expected Outcomes Demonstrated interest in learning. Expect positive outcomes    Future DMSE PRN    Program Status Not Completed      Subsequent Visit   Since your last visit have you experienced any weight changes? Gain           Individualized Plan for Diabetes Self-Management  Training:   Learning Objective:  Patient will have a greater understanding of diabetes self-management. Patient education plan is to attend individual and/or group sessions per assessed needs and concerns.   Plan:   Patient Instructions  Work on increasing your water intake and continue to avoid drinking beverages with sugar. Consider doing some of the Armchair exercises for more movement Consider getting a tongue scraper to help get the bad taste out of mouth in morning. May also want to talk to your doctor about this as well.   Expected Outcomes:  Demonstrated interest in learning. Expect positive outcomes  Education material provided: arm chair exercises  If problems or questions, patient to contact team via:  Phone  Future DSME appointment: PRN

## 2020-05-26 ENCOUNTER — Ambulatory Visit: Payer: Medicare HMO | Admitting: Family

## 2020-05-27 ENCOUNTER — Ambulatory Visit (INDEPENDENT_AMBULATORY_CARE_PROVIDER_SITE_OTHER): Payer: Medicare HMO | Admitting: Family

## 2020-05-27 ENCOUNTER — Encounter: Payer: Self-pay | Admitting: Family

## 2020-05-27 ENCOUNTER — Other Ambulatory Visit: Payer: Self-pay

## 2020-05-27 VITALS — BP 130/86 | HR 60 | Temp 97.3°F | Resp 18 | Ht 74.0 in | Wt 300.8 lb

## 2020-05-27 DIAGNOSIS — E785 Hyperlipidemia, unspecified: Secondary | ICD-10-CM | POA: Diagnosis not present

## 2020-05-27 DIAGNOSIS — E1169 Type 2 diabetes mellitus with other specified complication: Secondary | ICD-10-CM | POA: Diagnosis not present

## 2020-05-27 DIAGNOSIS — E119 Type 2 diabetes mellitus without complications: Secondary | ICD-10-CM

## 2020-05-27 DIAGNOSIS — K219 Gastro-esophageal reflux disease without esophagitis: Secondary | ICD-10-CM | POA: Diagnosis not present

## 2020-05-27 DIAGNOSIS — I10 Essential (primary) hypertension: Secondary | ICD-10-CM | POA: Diagnosis not present

## 2020-05-27 DIAGNOSIS — L989 Disorder of the skin and subcutaneous tissue, unspecified: Secondary | ICD-10-CM | POA: Diagnosis not present

## 2020-05-27 NOTE — Progress Notes (Signed)
Provider: Marlowe Sax FNP-C   Deone Omahoney, Nelda Bucks, NP  Patient Care Team: Ettel Albergo, Nelda Bucks, NP as PCP - [facility] ([facility] [place] [place] [facility]  Extended Emergency Contact Information Primary Emergency Contact: [name],Nicole Address: [address]          [place], [place] [address] [name] Phone: [phone] Relation: Daughter Secondary Emergency Contact: Ayer,Waleed  [place] Phone: [phone] Relation: Other  Code Status: Full Code  Goals of care: Advanced Directive information Advanced Directives 05/27/2020  Does Patient Have a Medical Advance Directive? No  Type of Advance Directive -  Does patient want to make changes to medical advance directive? -  Copy of [place] in Chart? -  Would patient like information on creating a medical advance directive? No - Patient declined     Chief Complaint  Patient presents with  . Medical Management of Chronic Issues    4 Month Follow Up.  Marland Kitchen Health Maintenance    Discuss the need for Foot Exam, and Eye Exam.  . Immunizations    Dicuss the need for Covid Vaccine.    HPI:  Pt is a 59 y.o. male seen today for4 Month follow up for medical management of chronic diseases.He is here today with daughter who is the care giver.He denies any acute issues.He states has quit smoking.congratulated for quitting cigarette smoking on buPropion 150 mg tablet twice daily.  Hypertension - no home B/p readings.on amlodipine 10 mg tablet daily,Hydralazine 25 mg table three times daily and clonidine 0.1 mg tablet every 8 hrs as needed.He denies any signs of hypotension.   Type 2 DM - does not check CBG at home. Latest glucose was 109 and Hgb A1c slightly higher than previous 6.9 previous 6.5.on metformin 250 mg tablet daily.Discussed a low carbohydrates diet and sweets.previous referral for annual eye and foot exam but unclear why didn't follow  up.He continues to follow up with Nutritionist.   Hyperlipidemia - total cholesterol,TRG and LDL have improved compared to previous level.continues to work on his diet with Nutritionist.unable to exercise much due to left hemiparesis.States walks daily to the mail box and back.  GERD - on protonix 40 mg tablet daily.Reports no blood in the stool   Past Medical History:  Diagnosis Date  . Acute respiratory failure ([place])   . Cerebral edema (HCC)   . Diabetes mellitus without complication (HCC)    diet controlled- no meds  . Dysphagia as late effect of cerebrovascular disease   . Fall at home   . Hemiparesis affecting left side as late effect of stroke ([place])   . Hyperlipidemia   . Hypertension   . Stroke ([place]) 2016  . Tuberculosis    11 yrs ago per pt.- treated with meds per pt.   Past Surgical History:  Procedure Laterality Date  . COLONOSCOPY  10/06/2016  . ESOPHAGOGASTRODUODENOSCOPY N/A 08/12/2015   Procedure: ESOPHAGOGASTRODUODENOSCOPY (EGD);  Surgeon: Ladene Artist, MD;  Location: [facility] ENDOSCOPY;  Service: Endoscopy;  Laterality: N/A;  . feeding tube removed    . GASTROSTOMY TUBE PLACEMENT  05-19-15  . mechanical thrombectomy angioplasty stenet placement    . SKIN GRAFT  1980's   ankle  . UPPER GASTROINTESTINAL ENDOSCOPY      No Known Allergies  Allergies as of 05/27/2020   No Known Allergies     Medication List       Accurate as of May 27, 2020 11:07 AM. If  you have any questions, ask your nurse or doctor.        amLODipine 10 MG tablet Commonly known as: NORVASC Take 1 tablet (10 mg total) by mouth daily.   aspirin 325 MG tablet Take 1 tablet (325 mg total) by mouth daily.   atorvastatin 80 MG tablet Commonly known as: LIPITOR Take 0.5 tablets (40 mg total) by mouth daily.   buPROPion 150 MG 12 hr tablet Commonly known as: WELLBUTRIN SR Take 1 tablet (150 mg total) by mouth 2 (two) times daily.   cloNIDine 0.1 MG tablet Commonly known as:  CATAPRES TAKE 1 TABLET (0.1 MG TOTAL) BY MOUTH EVERY 8 (EIGHT) HOURS AS NEEDED.   FLUoxetine 10 MG capsule Commonly known as: PROZAC Take 1 capsule (10 mg total) by mouth daily.   hydrALAZINE 25 MG tablet Commonly known as: APRESOLINE Take 1 tablet (25 mg total) by mouth 3 (three) times daily.   metFORMIN 500 MG tablet Commonly known as: GLUCOPHAGE Take 0.5 tablets (250 mg total) by mouth daily with breakfast.   OMEGA-3 FATTY ACIDS PO Take 2 Gm capsule  by mouth twice daily   pantoprazole 40 MG tablet Commonly known as: PROTONIX TAKE 1 TABLET BY MOUTH TWICE A DAY   SLEEP-AID PO Take by mouth.       Review of Systems  Constitutional: Negative for appetite change, chills, fatigue and fever.  HENT: Negative for congestion, rhinorrhea, sinus pressure, sinus pain, sneezing and sore throat.   Eyes: Negative for pain, discharge, itching and visual disturbance.  Respiratory: Negative for cough, chest tightness, shortness of breath and wheezing.   Cardiovascular: Negative for palpitations and leg swelling.  Gastrointestinal: Negative for abdominal distention, abdominal pain, constipation, diarrhea, nausea and vomiting.  Endocrine: Negative for cold intolerance, heat intolerance, polydipsia, polyphagia and polyuria.  Genitourinary: Negative for difficulty urinating, dysuria, flank pain, frequency, hematuria and urgency.  Musculoskeletal: Positive for gait problem. Negative for arthralgias, back pain, joint swelling and myalgias.  Skin: Negative for color change, pallor and rash.  Neurological: Negative for dizziness, speech difficulty, light-headedness and headaches.       Left hand weakness   Hematological: Does not bruise/bleed easily.  Psychiatric/Behavioral: Negative for agitation, confusion, hallucinations and sleep disturbance. The patient is not nervous/anxious.     Immunization History  Administered Date(s) Administered  . Influenza,inj,Quad PF,6+ Mos 08/10/2015,  09/22/2016, 07/29/2017, 11/27/2018, 09/28/2019  . Pneumococcal Conjugate-13 01/16/2019  . Pneumococcal Polysaccharide-23 12/10/2016  . Tdap 01/16/2019   Pertinent  Health Maintenance Due  Topic Date Due  . OPHTHALMOLOGY EXAM  Never done  . FOOT EXAM  01/12/2019  . INFLUENZA VACCINE  06/08/2020  . HEMOGLOBIN A1C  11/22/2020  . COLONOSCOPY  03/24/2023   Fall Risk  05/27/2020 03/07/2020 01/23/2020 01/17/2020 10/09/2019  Falls in the past year? 0 0 0 0 0  Number falls in past yr: 0 - 0 0 -  Injury with Fall? 0 - 0 0 -  Risk for fall due to : - - - - -    Vitals:   05/27/20 1041  BP: 130/86  Pulse: 60  Resp: 18  Temp: (!) 97.3 F (36.3 C)  SpO2: 96%  Weight: (!) 300 lb 12.8 oz (136.4 kg)  Height: _0  (1.88 m)   Body mass index is 38.62 kg/m. Physical Exam Vitals reviewed.  Constitutional:      General: He is not in acute distress.    Appearance: He is obese. He is not ill-appearing.  HENT:  Head: Normocephalic.     Right Ear: Tympanic membrane, ear canal and external ear normal. There is no impacted cerumen.     Left Ear: Tympanic membrane, ear canal and external ear normal. There is no impacted cerumen.     Nose: Nose normal. No congestion or rhinorrhea.     Mouth/Throat:     Mouth: Mucous membranes are moist.     Pharynx: Oropharynx is clear. No oropharyngeal exudate or posterior oropharyngeal erythema.  Eyes:     General: No scleral icterus.       Right eye: No discharge.        Left eye: No discharge.     Extraocular Movements: Extraocular movements intact.     Conjunctiva/sclera: Conjunctivae normal.     Pupils: Pupils are equal, round, and reactive to light.  Neck:     Vascular: No carotid bruit.  Cardiovascular:     Rate and Rhythm: Normal rate and regular rhythm.     Pulses: Normal pulses.     Heart sounds: Normal heart sounds. No murmur heard.  No friction rub. No gallop.   Pulmonary:     Effort: Pulmonary effort is normal. No respiratory distress.      Breath sounds: Normal breath sounds. No wheezing, rhonchi or rales.  Chest:     Chest wall: No tenderness.  Abdominal:     General: Bowel sounds are normal. There is no distension.     Palpations: Abdomen is soft. There is no mass.     Tenderness: There is no abdominal tenderness. There is no right CVA tenderness, left CVA tenderness, guarding or rebound.  Musculoskeletal:        General: No swelling or tenderness.     Cervical back: Normal range of motion. No rigidity or tenderness.     Right lower leg: No edema.     Left lower leg: No edema.     Comments: Unsteady left hand contracture   Lymphadenopathy:     Cervical: No cervical adenopathy.  Skin:    General: Skin is warm.     Coloration: Skin is not pale.     Findings: No bruising, erythema or rash.     Comments: Left inner thigh raised skin  lesion   Neurological:     Mental Status: He is alert and oriented to person, place, and time.     Cranial Nerves: No cranial nerve deficit.     Motor: Weakness present.     Gait: Gait abnormal.     Comments: Left hemi(legia.  Psychiatric:        Mood and Affect: Mood normal.        Behavior: Behavior normal.        Thought Content: Thought content normal.        Judgment: Judgment normal.    Labs reviewed: Recent Labs    09/28/19 1534 01/21/20 1011 05/22/20 1031  NA 141 140 139  K 4.3 4.5 4.3  CL 103 104 105  CO2 _0 GLUCOSE 133* 123* 109*  BUN 16 23 28*  CREATININE 1.62* 1.20 1.34*  CALCIUM 9.8 9.5 9.9   Recent Labs    09/28/19 1534 01/21/20 1011 05/22/20 1031  AST _1 ALT _2 BILITOT 0.5 0.4 0.4  PROT 8.0 7.5 7.6   Recent Labs    09/28/19 1534 01/21/20 1011 05/22/20 1031  WBC 5.6 5.0 5.8  NEUTROABS 3,091 2,815 2,941  HGB 15.0 14.3 13.8  HCT 44.7  43.0 41.5  MCV 87.0 88.3 87.7  PLT 164 125* 132*   Lab Results  Component Value Date   TSH 2.62 05/22/2020   Lab Results  Component Value Date   HGBA1C 6.9 (H) 05/22/2020   Lab  Results  Component Value Date   CHOL 146 05/22/2020   HDL 43 05/22/2020   LDLCALC 75 05/22/2020   TRIG 179 (H) 05/22/2020   CHOLHDL 3.4 05/22/2020    Significant Diagnostic Results in last 30 days:  No results found.  Assessment/Plan 1. Essential hypertension B/p at goal.continue on amlodipine 10 mg tablet daily,Hydralazine 25 mg table three times daily and clonidine 0.1 mg tablet every 8 hrs as needed - on ASA and statin. - CBC with Differential/Platelet; Future - CMP with eGFR([facility]); Future - TSH; Future  2. Type 2 diabetes mellitus without complication, without long-term current use of insulin (HCC) Lab Results  Component Value Date   HGBA1C 6.9 (H) 05/22/2020  Recent Glucose 109 no sighs of hypoglycemia.continue on ASA and Statin. - Ambulatory referral to Podiatry - Ambulatory referral to Ophthalmology - CBC with Differential/Platelet; Future - CMP with eGFR([facility]); Future - TSH; Future - Hemoglobin A1c; Future  3. Gastroesophageal reflux disease without esophagitis Symptoms under control.continue on Protonix H/H stable..  4. Dyslipidemia associated with type 2 diabetes mellitus ([place]) LDL not at goal.continue on atorvastatin and omega- 3 fatty acid.continue to follow up with Nutritionist for dietary modification.  - Lipid panel; Future  5. Skin lesion of left leg Left inner thigh raised skin lesion noted.will refer to dermatologist for evaluation.  - Ambulatory referral to Dermatology  Family/ staff Communication: Reviewed plan of care with patient and daughter.   Labs/tests ordered:  - CBC with Differential/Platelet; Future - CMP with eGFR([facility]); Future - TSH; Future - Hemoglobin A1c; Future - Lipid panel; Future  Next Appointment : 6 months for medical management of chronic issues.Fasting labs 2-4 days prior to visit.   Sandrea Hughs, NP

## 2020-07-18 ENCOUNTER — Other Ambulatory Visit: Payer: Self-pay | Admitting: Family

## 2020-07-18 DIAGNOSIS — I10 Essential (primary) hypertension: Secondary | ICD-10-CM

## 2020-07-28 ENCOUNTER — Encounter: Payer: Self-pay | Admitting: Family

## 2020-07-28 DIAGNOSIS — E119 Type 2 diabetes mellitus without complications: Secondary | ICD-10-CM | POA: Diagnosis not present

## 2020-07-28 DIAGNOSIS — H25813 Combined forms of age-related cataract, bilateral: Secondary | ICD-10-CM | POA: Diagnosis not present

## 2020-07-28 LAB — HM DIABETES EYE EXAM

## 2020-08-08 ENCOUNTER — Ambulatory Visit (INDEPENDENT_AMBULATORY_CARE_PROVIDER_SITE_OTHER): Payer: Medicare HMO | Admitting: Podiatry

## 2020-08-08 ENCOUNTER — Encounter: Payer: Self-pay | Admitting: Podiatry

## 2020-08-08 ENCOUNTER — Other Ambulatory Visit: Payer: Self-pay

## 2020-08-08 DIAGNOSIS — E1149 Type 2 diabetes mellitus with other diabetic neurological complication: Secondary | ICD-10-CM | POA: Diagnosis not present

## 2020-08-08 DIAGNOSIS — B351 Tinea unguium: Secondary | ICD-10-CM | POA: Diagnosis not present

## 2020-08-08 DIAGNOSIS — F0150 Vascular dementia without behavioral disturbance: Secondary | ICD-10-CM | POA: Diagnosis not present

## 2020-08-08 NOTE — Progress Notes (Signed)
This patient returns to my office for at risk foot care.  This patient requires this care by a professional since this patient will be at risk due to having CVA and type 2 diabetes.  This patient is unable to cut nails himself since the patient cannot reach his nails.These nails are painful walking and wearing shoes. He presents to the office with a caregiver. This patient presents for at risk foot care today.  General Appearance  Alert, conversant and in no acute stress.  Vascular  Dorsalis pedis  are palpable  Bilaterally.  Posterior tibial pulses are absent  Bilateral.  Capillary return is within normal limits  bilaterally. Temperature is within normal limits  bilaterally.  Neurologic  Senn-Weinstein monofilament wire test within normal limits  bilaterally. Muscle power within normal limits bilaterally.  Nails Thick disfigured discolored nails with subungual debris  from hallux to fifth toes bilaterally. No evidence of bacterial infection or drainage bilaterally.  Orthopedic  No limitations of motion  Right foot.  Limited ROM left foot. Marland Kitchen  No crepitus or effusions noted.  No bony pathology or digital deformities noted.  Skin  normotropic skin with no porokeratosis noted bilaterally.  No signs of infections or ulcers noted.     Onychomycosis  Pain in right toes  Pain in left toes  Consent was obtained for treatment procedures.   Mechanical debridement of nails 1-5  bilaterally performed with a nail nipper.  Filed with dremel without incident.    Return office visit     3 months                 Told patient to return for periodic foot care and evaluation due to potential at risk complications.   Gardiner Barefoot DPM

## 2020-08-25 ENCOUNTER — Encounter: Payer: Medicare HMO | Attending: Family | Admitting: Registered"

## 2020-08-25 DIAGNOSIS — E1149 Type 2 diabetes mellitus with other diabetic neurological complication: Secondary | ICD-10-CM | POA: Insufficient documentation

## 2020-08-25 NOTE — Patient Instructions (Addendum)
Plan:  Try diet ginger ale instead of regular ginger ale Increase excise to 8 minutes per day Continue using tongue scraper when brushing teeth at night.  For your snack while watching TV eat half as many Cheese Puffs and have a few nuts and chew your snacks well, eating slowly. Increase your water intake to 5 bottles of water per day.

## 2020-08-25 NOTE — Progress Notes (Signed)
Diabetes Self-Management Education  Visit Type: Follow-up  Appt. Start Time: 0949 Appt. End Time: 1610  08/25/2020  ASSESSMENT  [name], identified by name and date of birth, is a 59 y.o. male with a diagnosis of Diabetes: Type 2.   Patient is not able to get into office, requires telephone visits. Patient does not have MyChart.  Pt states bad taste in his mouth isn't bothering him anymore. Pt states he bought a tongue scraper at the dollar store and uses it an night when he brushes his teeth. Pt states he stopped sucking on peppermint candies. Pt states he started drinking regular ginger ale 1 can in the morning with breakfast and medicine because it has strong taste. Pt states he hasn't tried diet ginger ale.  Since last nutrition visit patient states he stopped drinking koolaide. Pt states he is drinking 4 bottles of water per day (was drinking 2 bottles).    Pt states he states he received the Arm chair exercise handout in the mail and has started doing about 5 min per day.  Pt reports he used to eat a large bag of cheese puffs while watching TV but reduced the amount to 1/2 bag because the full bag makes him constipated. Pt reports last night while watching football game he ate 1/2 bag cheese puffs.   Diabetes Self-Management Education - 08/25/20 1001      Visit Information   Visit Type Follow-up      Psychosocial Assessment   How often do you need to have someone help you when you read instructions, pamphlets, or other written materials from your doctor or pharmacy? 5 - Always      Dietary Intake   Breakfast honey nut cheerios, 2% milk, banana    Snack (morning) none    Lunch cabbage, fish sticks,    Dinner chicken, pinto beans,    Snack (evening) 1/2 big bag cheese puff    Beverage(s) water, ginger ale      Exercise   Exercise Type Other (comment)   arm chair exercis   How many days per week to you exercise? 7    How many minutes per day do you exercise? 5     Total minutes per week of exercise 35      Patient Education   Nutrition management  Other (comment)   drinks with sugar     Individualized Goals (developed by patient)   Nutrition General guidelines for healthy choices and portions discussed;Other (comment)   increase water intake   Physical Activity Exercise 5-7 days per week      Patient Self-Evaluation of Goals - Patient rates self as meeting previously set goals (% of time)   Nutrition 50 - 75 %    Physical Activity >75%      Outcomes   Expected Outcomes Demonstrated interest in learning. Expect positive outcomes    Future DMSE 2 months    Program Status Not Completed      Subsequent Visit   Since your last visit have you continued or begun to take your medications as prescribed? Yes           Individualized Plan for Diabetes Self-Management Training:   Learning Objective:  Patient will have a greater understanding of diabetes self-management. Patient education plan is to attend individual and/or group sessions per assessed needs and concerns.    Patient Instructions  Plan:  Try diet ginger ale instead of regular ginger ale Increase excise to 8 minute per  day Continue using tongue scraper when brushing teeth at night.  For your snack while watching TV eat half as many Cheese Puffs and have a few nuts and chew your snacks well, eating slowly. Increase your water intake to 5 bottles of water per day.   Expected Outcomes:  Demonstrated interest in learning. Expect positive outcomes  Education material provided: AVS mailed  If problems or questions, patient to contact team via:  Phone  Future DSME appointment: 2 months

## 2020-09-02 ENCOUNTER — Other Ambulatory Visit: Payer: Self-pay | Admitting: Family

## 2020-09-02 DIAGNOSIS — E119 Type 2 diabetes mellitus without complications: Secondary | ICD-10-CM

## 2020-09-14 ENCOUNTER — Other Ambulatory Visit: Payer: Self-pay | Admitting: Family

## 2020-10-20 ENCOUNTER — Encounter: Payer: Medicare HMO | Attending: Family | Admitting: Registered"

## 2020-10-20 DIAGNOSIS — E1149 Type 2 diabetes mellitus with other diabetic neurological complication: Secondary | ICD-10-CM | POA: Insufficient documentation

## 2020-10-21 ENCOUNTER — Encounter: Payer: Self-pay | Admitting: Registered"

## 2020-10-21 NOTE — Patient Instructions (Addendum)
Great work in improvements you are making! Continue drinking 5 bottles of water per day Increase vegetable intake by including them with your lunch (you chose carrots or broccoli) Increase physical activity by doing your leg exercises for 10 min, 2 times per day.

## 2020-10-21 NOTE — Progress Notes (Signed)
Diabetes Self-Management Education  Visit Type: Follow-up  Appt. Start Time: 0930 Appt. End Time: 9892  10/21/2020  ASSESSMENT  [name], identified by name and date of birth, is a 59 y.o. male with a diagnosis of Diabetes: Type 2.   Patient is not able to get into office, requires telephone visits. Patient does not have MyChart.  RD checked in with pt to see if the bad taste in his mouth is still resolved. Patient states he brushes his teeth at night and uses Listerine which prevents the morning bad taste.   Pt states he is only drinking water now and has increased from 2 bottles per day at first appointment, 4 bottles last visit and now  5 bottles per day. Pt states it has increased his trips to the bathroom, but said he is okay with that.  Pt states he has started the leg lifts from the Arm chair exercise handout and increased from 5 min per day to 10 min per day.  Pt reports changed his TV snack from cheese puffs to crackers with peanut butter.   Individualized Plan for Diabetes Self-Management Training:   Learning Objective:  Patient will have a greater understanding of diabetes self-management. Patient education plan is to attend individual and/or group sessions per assessed needs and concerns.    Patient Instructions  Doristine Devoid work in improvements you are making! Continue drinking 5 bottles of water per day Increase vegetable intake by including them with your lunch (you chose carrots or broccoli) Increase physical activity by doing your leg exercises 2 times per day.   Expected Outcomes:   expect positive improvement  Education material provided: AVS mailed  If problems or questions, patient to contact team via:  Phone  Future DSME appointment:  6 weeks

## 2020-11-12 ENCOUNTER — Ambulatory Visit: Payer: Medicare HMO | Admitting: Podiatry

## 2020-11-17 ENCOUNTER — Telehealth: Payer: Self-pay | Admitting: Registered"

## 2020-11-17 ENCOUNTER — Encounter: Payer: Self-pay | Admitting: Registered"

## 2020-11-17 ENCOUNTER — Other Ambulatory Visit: Payer: Self-pay

## 2020-11-17 ENCOUNTER — Encounter: Payer: Medicare HMO | Attending: Family | Admitting: Registered"

## 2020-11-17 DIAGNOSIS — E1149 Type 2 diabetes mellitus with other diabetic neurological complication: Secondary | ICD-10-CM | POA: Insufficient documentation

## 2020-11-17 NOTE — Patient Instructions (Signed)
Goals:  Food: Continue drinking 5 bottles of water Continue eating vegetables every day Eat less bread. When you get a loaf of bread, put 1/2 in the freezer  Exercise: Walk from the front room to your room 2 times per day Do exercise by standing up from your chair 10 times per day.  Next visit we will talk more about sleep

## 2020-11-17 NOTE — Progress Notes (Signed)
Visit via telephone Patient is not able to get into office, requires telephone visits. Patient does not have MyChart.  Diabetes Self-Management Education  Visit Type: Follow-up  Appt. Start Time: 1115 Appt. End Time: 5621 (called back later for additional time)  11/18/2019  ASSESSMENT  [name], identified by name and date of birth, is a 60 y.o. male with a diagnosis of Diabetes: Type 2.   RD could hear another person in the background. Pt states he has a roommate now.  Pt states he continues to drink 5 bottles of water and is not interested in increasing fluid intake at this time due to increased urination.   Pt states he stopped doing leg lifts from the Arm chair exercise handout. Pt states he is not interested in doing those exercises again. Pt reports he walks from the front room to his bedroom. Pt states his legs hurt and he takes tylenol at night.   Pt states he has increased vegetable intake, started microwaving carrots and broccoli, zucchini. Pt states he eats a loaf of bread and wants to have a goal of eating less bread. Patient did not include bread in his dietary recall.  Patient states he goes to sleep about 1 am and wakes up 3 am. Pt reports he watches TV and may fall asleep again. Pt states he watches TV during the day and may or may not dose off. Pt states he has been taking sleep medicine for a while. Pt states he doesn't do anything during the day to make him sleepy. Pt reports when he was working 3rd shift he had a hard time sleeping too. Patient may not believe that being more active will help him sleep better.  Patient called back asking about pizza. RD stressed moderation and to include vegetable such as green beans or salad.  Dietary recall:  Breakfast: chicken, zucchini Dinner: fish sticks Snack: peanut butter and crackers  Individualized Plan for Diabetes Self-Management Training:     Patient Instructions  Goals:  Food: Continue drinking 5 bottles  of water Continue eating vegetables every day Eat less bread. When you get a loaf of bread, put 1/2 in the freezer  Exercise: Walk from the front room to your room 2 times per day Do exercise by standing up from your chair 10 times per day.  Next visit we will talk more about sleep   Expected Outcomes:   expect positive improvement  Education material provided: AVS mailed  If problems or questions, patient to contact team via:  Phone  Future DSME appointment:  6 weeks

## 2020-11-18 NOTE — Telephone Encounter (Signed)
Patient called asking if he can eat 1 or 2 pieces of pizza. RD said it was fine and it would be good if he would eat vegetables with his pizza like a salad or something. Pt states he did not have any other questions.

## 2020-11-25 ENCOUNTER — Ambulatory Visit: Payer: Medicare HMO | Admitting: Dermatology

## 2020-11-25 ENCOUNTER — Other Ambulatory Visit: Payer: Medicare HMO

## 2020-11-28 ENCOUNTER — Ambulatory Visit: Payer: Medicare HMO | Admitting: Family

## 2020-11-28 ENCOUNTER — Other Ambulatory Visit: Payer: Self-pay | Admitting: Family

## 2020-11-28 DIAGNOSIS — F17209 Nicotine dependence, unspecified, with unspecified nicotine-induced disorders: Secondary | ICD-10-CM

## 2020-12-05 ENCOUNTER — Ambulatory Visit: Payer: Medicare HMO | Admitting: Family

## 2020-12-08 ENCOUNTER — Encounter: Payer: Self-pay | Admitting: Family

## 2020-12-08 ENCOUNTER — Ambulatory Visit (INDEPENDENT_AMBULATORY_CARE_PROVIDER_SITE_OTHER): Payer: Medicare HMO | Admitting: Family

## 2020-12-08 ENCOUNTER — Other Ambulatory Visit: Payer: Self-pay

## 2020-12-08 VITALS — BP 130/88 | HR 81 | Temp 96.8°F | Resp 18 | Ht 74.0 in | Wt 299.6 lb

## 2020-12-08 DIAGNOSIS — E785 Hyperlipidemia, unspecified: Secondary | ICD-10-CM | POA: Diagnosis not present

## 2020-12-08 DIAGNOSIS — Z23 Encounter for immunization: Secondary | ICD-10-CM | POA: Diagnosis not present

## 2020-12-08 DIAGNOSIS — E119 Type 2 diabetes mellitus without complications: Secondary | ICD-10-CM | POA: Diagnosis not present

## 2020-12-08 DIAGNOSIS — E1149 Type 2 diabetes mellitus with other diabetic neurological complication: Secondary | ICD-10-CM | POA: Diagnosis not present

## 2020-12-08 DIAGNOSIS — F32A Depression, unspecified: Secondary | ICD-10-CM

## 2020-12-08 DIAGNOSIS — E1169 Type 2 diabetes mellitus with other specified complication: Secondary | ICD-10-CM

## 2020-12-08 DIAGNOSIS — I10 Essential (primary) hypertension: Secondary | ICD-10-CM

## 2020-12-08 DIAGNOSIS — R3912 Poor urinary stream: Secondary | ICD-10-CM

## 2020-12-08 DIAGNOSIS — K219 Gastro-esophageal reflux disease without esophagitis: Secondary | ICD-10-CM | POA: Diagnosis not present

## 2020-12-08 DIAGNOSIS — I69352 Hemiplegia and hemiparesis following cerebral infarction affecting left dominant side: Secondary | ICD-10-CM | POA: Diagnosis not present

## 2020-12-08 MED ORDER — FLUOXETINE HCL 10 MG PO CAPS: ORAL_CAPSULE | ORAL | 1 refills | Status: DC

## 2020-12-08 MED ORDER — ATORVASTATIN CALCIUM 80 MG PO TABS: 40.0000 mg | ORAL_TABLET | Freq: Every day | ORAL | 1 refills | Status: DC

## 2020-12-08 MED ORDER — CLONIDINE HCL 0.1 MG PO TABS: 0.1000 mg | ORAL_TABLET | Freq: Three times a day (TID) | ORAL | 1 refills | Status: DC | PRN

## 2020-12-08 MED ORDER — AMLODIPINE BESYLATE 10 MG PO TABS: 10.0000 mg | ORAL_TABLET | Freq: Every day | ORAL | 1 refills | Status: DC

## 2020-12-08 MED ORDER — PANTOPRAZOLE SODIUM 40 MG PO TBEC: 40.0000 mg | DELAYED_RELEASE_TABLET | Freq: Two times a day (BID) | ORAL | 1 refills | Status: DC

## 2020-12-08 NOTE — Progress Notes (Signed)
Provider: Marlowe Sax FNP-C   Neeva Trew, Nelda Bucks, NP  Patient Care Team: Desiderio Dolata, Nelda Bucks, NP as PCP - [facility] ([facility]) [facility], [place] ([place])  Extended Emergency Contact Information Primary Emergency Contact: Rupard,Yavonda Mobile Phone: [phone] Relation: Daughter Secondary Emergency Contact: [name],Nicole Address: [address]          [place], [place] [address] [place] of [place] Mobile Phone: [phone] Relation: Daughter  Code Status:  Full Code  Goals of care: Advanced Directive information Advanced Directives 12/08/2020  Does Patient Have a Medical Advance Directive? No  Type of Advance Directive -  Does patient want to make changes to medical advance directive? -  Copy of [place] in Chart? -  Would patient like information on creating a medical advance directive? No - Patient declined     Chief Complaint  Patient presents with  . Medical Management of Chronic Issues    6 Month Follow Up.  Marland Kitchen Health Maintenance    Discuss the need for Hemoglobin A1C.  . Immunizations    Discuss the need for Influenza Vaccine and Covid Vaccine.     HPI:  Pt is a 60 y.o. male seen today for 6 months follow up for medical management of chronic diseases.He has a medical history of Hypertension,Type 2 DM ,Hyperlipidemia,GERD,Hemipareis affecting left side as late effects of stroke among other conditions.He is here by himself today. No Home blood sugar or blood pressure readings for review.takes medication as prescribed request refills for several medication this visit. Has quit smoking cigarette several months ago 2021 states no long taking bupropion which was prescribed for smoking cessation.Patient very pleased with his goal.congratulated during visit.      Past Medical History:  Diagnosis Date  . Acute respiratory failure ([place])   . Cerebral edema (HCC)   . Diabetes mellitus without complication (HCC)     diet controlled- no meds  . Dysphagia as late effect of cerebrovascular disease   . Fall at home   . Hemiparesis affecting left side as late effect of stroke ([place])   . Hyperlipidemia   . Hypertension   . Stroke ([place]) 2016  . Tuberculosis    11 yrs ago per pt.- treated with meds per pt.   Past Surgical History:  Procedure Laterality Date  . COLONOSCOPY  10/06/2016  . ESOPHAGOGASTRODUODENOSCOPY N/A 08/12/2015   Procedure: ESOPHAGOGASTRODUODENOSCOPY (EGD);  Surgeon: Ladene Artist, MD;  Location: [facility] END[facility]COPY;  Service: Endoscopy;  Laterality: N/A;  . feeding tube removed    . GASTROSTOMY TUBE PLACEMENT  05-19-15  . mechanical thrombectomy angioplasty stenet placement    . SKIN GRAFT  1980's   ankle  . UPPER GASTROINTESTINAL ENDOSCOPY      No Known Allergies  Allergies as of 12/08/2020   No Known Allergies     Medication List       Accurate as of December 08, 2020  9:29 AM. If you have any questions, ask your nurse or doctor.        STOP taking these medications   buPROPion 150 MG 12 hr tablet Commonly known as: WELLBUTRIN SR Stopped by: Sandrea Hughs, NP   metFORMIN 500 MG tablet Commonly known as: GLUCOPHAGE Stopped by: Sandrea Hughs, NP     TAKE these medications   amLODipine 10 MG tablet Commonly known as: NORVASC TAKE 1 TABLET BY MOUTH EVERY DAY   aspirin 325 MG tablet Take 1 tablet (325 mg total) by mouth daily.  atorvastatin 80 MG tablet Commonly known as: LIPITOR Take 0.5 tablets (40 mg total) by mouth daily.   cloNIDine 0.1 MG tablet Commonly known as: CATAPRES TAKE 1 TABLET (0.1 MG TOTAL) BY MOUTH EVERY 8 (EIGHT) HOURS AS NEEDED.   FLUoxetine 10 MG capsule Commonly known as: PROZAC TAKE 1 CAPSULE BY MOUTH EVERY DAY   hydrALAZINE 25 MG tablet Commonly known as: APRESOLINE TAKE 1 TABLET BY MOUTH THREE TIMES A DAY   OMEGA-3 FATTY ACIDS PO Take 2 Gm capsule  by mouth twice daily   pantoprazole 40 MG tablet Commonly known as:  PROTONIX TAKE 1 TABLET BY MOUTH TWICE A DAY   SLEEP-AID PO Take by mouth.       Review of Systems  Constitutional: Negative for appetite change, chills, fatigue and fever.  HENT: Negative for congestion, hearing loss, rhinorrhea, sinus pressure, sinus pain, sneezing and sore throat.   Eyes: Negative for discharge, redness, itching and visual disturbance.       Wears reading glasses   Respiratory: Negative for cough, chest tightness, shortness of breath and wheezing.   Cardiovascular: Negative for chest pain, palpitations and leg swelling.  Gastrointestinal: Negative for abdominal distention, abdominal pain, constipation, diarrhea, nausea, rectal pain and vomiting.  Endocrine: Negative for cold intolerance, heat intolerance, polydipsia, polyphagia and polyuria.  Genitourinary: Negative for difficulty urinating, dysuria, flank pain, frequency and urgency.  Musculoskeletal: Negative for arthralgias, back pain, gait problem, joint swelling and myalgias.  Skin: Negative for color change, pallor and rash.  Neurological: Negative for dizziness, speech difficulty, weakness, light-headedness, numbness and headaches.       Left hand contracture   Hematological: Does not bruise/bleed easily.  Psychiatric/Behavioral: Negative for agitation, behavioral problems and sleep disturbance. The patient is not nervous/anxious.     Immunization History  Administered Date(s) Administered  . Influenza,inj,Quad PF,6+ Mos 08/10/2015, 09/22/2016, 07/29/2017, 11/27/2018, 09/28/2019  . Pneumococcal Conjugate-13 01/16/2019  . Pneumococcal Polysaccharide-23 12/10/2016  . Tdap 01/16/2019   Pertinent  Health Maintenance Due  Topic Date Due  . INFLUENZA VACCINE  06/08/2020  . HEMOGLOBIN A1C  11/22/2020  . OPHTHALMOLOGY EXAM  07/28/2021  . FOOT EXAM  08/08/2021  . COLONOSCOPY (Pts 45-40yrs Insurance coverage will need to be confirmed)  03/24/2023   Fall Risk  12/08/2020 05/27/2020 03/07/2020 01/23/2020 01/17/2020   Falls in the past year? 0 0 0 0 0  Number falls in past yr: 0 0 - 0 0  Injury with Fall? 0 0 - 0 0  Risk for fall due to : - - - - -   Functional Status Survey:    Vitals:   12/08/20 0905  BP: 130/88  Pulse: 81  Resp: 18  Temp: (!) 96.8 F (36 C)  SpO2: 96%  Weight: 299 lb 9.6 oz (135.9 kg)  Height: 6\' 2"  (1.88 m)   Body mass index is 38.47 kg/m. Physical Exam Vitals reviewed.  Constitutional:      General: He is not in acute distress.    Appearance: He is obese. He is not ill-appearing.  HENT:     Head: Normocephalic.     Right Ear: Tympanic membrane, ear canal and external ear normal. There is no impacted cerumen.     Left Ear: Tympanic membrane, ear canal and external ear normal. There is no impacted cerumen.     Nose: Nose normal. No congestion or rhinorrhea.     Mouth/Throat:     Mouth: Mucous membranes are moist.     Pharynx: Oropharynx is clear.  No oropharyngeal exudate or posterior oropharyngeal erythema.  Eyes:     General: No scleral icterus.       Right eye: No discharge.        Left eye: No discharge.     Extraocular Movements: Extraocular movements intact.     Conjunctiva/sclera: Conjunctivae normal.     Pupils: Pupils are equal, round, and reactive to light.  Neck:     Vascular: No carotid bruit.  Cardiovascular:     Rate and Rhythm: Normal rate and regular rhythm.     Pulses: Normal pulses.     Heart sounds: Normal heart sounds. No murmur heard. No friction rub. No gallop.   Pulmonary:     Effort: Pulmonary effort is normal. No respiratory distress.     Breath sounds: Normal breath sounds. No wheezing, rhonchi or rales.  Chest:     Chest wall: No tenderness.  Abdominal:     General: Bowel sounds are normal. There is no distension.     Palpations: Abdomen is soft. There is no mass.     Tenderness: There is no abdominal tenderness. There is no right CVA tenderness, left CVA tenderness, guarding or rebound.  Genitourinary:    Comments: Decline  rectal exam  Musculoskeletal:        General: No swelling or tenderness.     Cervical back: Normal range of motion. No rigidity or tenderness.     Right lower leg: No edema.     Left lower leg: No edema.     Comments: Left hemiparesis  Left hand contracture   Lymphadenopathy:     Cervical: No cervical adenopathy.  Skin:    General: Skin is warm and dry.     Coloration: Skin is not pale.     Findings: No bruising, erythema or rash.  Neurological:     Mental Status: He is oriented to person, place, and time.     Cranial Nerves: No cranial nerve deficit.     Gait: Gait abnormal.     Comments: Left hemiparesis   Psychiatric:        Mood and Affect: Mood normal.        Behavior: Behavior normal.        Thought Content: Thought content normal.        Judgment: Judgment normal.     Labs reviewed: Recent Labs    01/21/20 1011 05/22/20 1031  NA 140 139  K 4.5 4.3  CL 104 105  CO2 27 20  GLUCOSE 123* 109*  BUN 23 28*  CREATININE 1.20 1.34*  CALCIUM 9.5 9.9   Recent Labs    01/21/20 1011 05/22/20 1031  AST 21 19  ALT 25 21  BILITOT 0.4 0.4  PROT 7.5 7.6   Recent Labs    01/21/20 1011 05/22/20 1031  WBC 5.0 5.8  NEUTROABS 2,815 2,941  HGB 14.3 13.8  HCT 43.0 41.5  MCV 88.3 87.7  PLT 125* 132*   Lab Results  Component Value Date   TSH 2.62 05/22/2020   Lab Results  Component Value Date   HGBA1C 6.9 (H) 05/22/2020   Lab Results  Component Value Date   CHOL 146 05/22/2020   HDL 43 05/22/2020   LDLCALC 75 05/22/2020   TRIG 179 (H) 05/22/2020   CHOLHDL 3.4 05/22/2020    Significant Diagnostic Results in last 30 days:  No results found.  Assessment/Plan 1. Type 2 diabetes mellitus without complication, without long-term current use of insulin (HCC) Lab Results  Component Value Date   HGBA1C 6.9 (H) 05/22/2020   No home CBG readings for evaluation.latest glucose on labs well controlled. Diet controlled. - continue on ASA and Statin. 2. Essential  hypertension B/p at goal. - continue on Amlodipine ,Hydralazaine and clonidine PRN - amLODipine (NORVASC) 10 MG tablet; Take 1 tablet (10 mg total) by mouth daily.  Dispense: 90 tablet; Refill: 1 - cloNIDine (CATAPRES) 0.1 MG tablet; Take 1 tablet (0.1 mg total) by mouth every 8 (eight) hours as needed.  Dispense: 270 tablet; Refill: 1 - atorvastatin (LIPITOR) 80 MG tablet; Take 0.5 tablets (40 mg total) by mouth daily.  Dispense: 90 tablet; Refill: 1  3. Dyslipidemia associated with type 2 diabetes mellitus (HCC) LDL at goal. - continue on atorvastatin and Omega-3 fatty acids  - encouraged to continue with dietary modification and exercise.  - atorvastatin (LIPITOR) 80 MG tablet; Take 0.5 tablets (40 mg total) by mouth daily.  Dispense: 90 tablet; Refill: 1  4. Gastroesophageal reflux disease without esophagitis Symptoms controlled on Protonix. - pantoprazole (PROTONIX) 40 MG tablet; Take 1 tablet (40 mg total) by mouth 2 (two) times daily.  Dispense: 180 tablet; Refill: 1  5. Depression, unspecified depression type Stable on Prozac.Has stopped bupropion after smoking cessation.  Will to monitor for mood changes.   - FLUoxetine (PROZAC) 10 MG capsule; TAKE 1 CAPSULE BY MOUTH EVERY DAY  Dispense: 90 capsule; Refill: 1  6.  Weak urine stream Reports weak stream but no urine retention.decline rectal exam this visit.Advised to notify provider if symptoms worsen  Will refer to urologist if desired  - PSA  7. Need for influenza vaccination Afebrile.No URI's  - Influenza vaccine administered by CMA  - Flu Vaccine QUAD 6+ mos PF IM (Fluarix Quad PF)  Family/ staff Communication: Reviewed plan of care with patient verbalized understanding.   Labs/tests ordered:- PSA  Next Appointment : 6 months for medical management of chronic issues with fasting labs   Sandrea Hughs, NP

## 2020-12-09 LAB — PSA: PSA: 3.09 ng/mL (ref <=4.0)

## 2021-02-27 ENCOUNTER — Telehealth: Payer: Self-pay | Admitting: Registered"

## 2021-02-27 NOTE — Telephone Encounter (Signed)
Pt left message for RD asking if okay if he drinks juice.  RD returned call but reached voice mail. RD left message stating because juice can raise blood sugar just to not drink too much and half a glass here and there should not be a problem. RD asked patient to call back Monday to schedule a follow-up visit if he has more questions.

## 2021-06-08 ENCOUNTER — Encounter: Payer: Self-pay | Admitting: Family

## 2021-06-09 ENCOUNTER — Other Ambulatory Visit: Payer: Medicare HMO

## 2021-06-09 ENCOUNTER — Other Ambulatory Visit: Payer: Self-pay

## 2021-06-09 ENCOUNTER — Ambulatory Visit (INDEPENDENT_AMBULATORY_CARE_PROVIDER_SITE_OTHER): Payer: Medicare HMO | Admitting: Family

## 2021-06-09 VITALS — BP 150/90 | HR 55 | Temp 97.5°F | Resp 18 | Ht 74.0 in | Wt 230.6 lb

## 2021-06-09 DIAGNOSIS — Z6829 Body mass index (BMI) 29.0-29.9, adult: Secondary | ICD-10-CM

## 2021-06-09 DIAGNOSIS — K219 Gastro-esophageal reflux disease without esophagitis: Secondary | ICD-10-CM

## 2021-06-09 DIAGNOSIS — E785 Hyperlipidemia, unspecified: Secondary | ICD-10-CM | POA: Diagnosis not present

## 2021-06-09 DIAGNOSIS — I69354 Hemiplegia and hemiparesis following cerebral infarction affecting left non-dominant side: Secondary | ICD-10-CM

## 2021-06-09 DIAGNOSIS — E663 Overweight: Secondary | ICD-10-CM

## 2021-06-09 DIAGNOSIS — I10 Essential (primary) hypertension: Secondary | ICD-10-CM | POA: Diagnosis not present

## 2021-06-09 DIAGNOSIS — L602 Onychogryphosis: Secondary | ICD-10-CM | POA: Diagnosis not present

## 2021-06-09 DIAGNOSIS — F5101 Primary insomnia: Secondary | ICD-10-CM | POA: Diagnosis not present

## 2021-06-09 DIAGNOSIS — F321 Major depressive disorder, single episode, moderate: Secondary | ICD-10-CM | POA: Diagnosis not present

## 2021-06-09 DIAGNOSIS — L989 Disorder of the skin and subcutaneous tissue, unspecified: Secondary | ICD-10-CM

## 2021-06-09 DIAGNOSIS — E119 Type 2 diabetes mellitus without complications: Secondary | ICD-10-CM | POA: Diagnosis not present

## 2021-06-09 DIAGNOSIS — I6930 Unspecified sequelae of cerebral infarction: Secondary | ICD-10-CM

## 2021-06-09 DIAGNOSIS — F0150 Vascular dementia without behavioral disturbance: Secondary | ICD-10-CM | POA: Diagnosis not present

## 2021-06-09 MED ORDER — FLUOXETINE HCL 10 MG PO CAPS: ORAL_CAPSULE | ORAL | 1 refills | Status: DC

## 2021-06-09 NOTE — Patient Instructions (Signed)
-   Please get your shingrix vaccine and COVID-19 vaccine at your pharmacy

## 2021-06-09 NOTE — Progress Notes (Signed)
Provider: Marlowe Sax FNP-C   Glenda Spelman, Nelda Bucks, NP  Patient Care Team: Davontay Watlington, Nelda Bucks, NP as PCP - General (Family Medicine) [facility], [place] [facility]  Extended Emergency Contact Information Primary Emergency Contact: Northington,Yavonda Mobile Phone: [phone] Relation: Daughter Secondary Emergency Contact: [name],Nicole Address: [address]          [place], [place] [address] Johnnette Litter of [place] Mobile Phone: [phone] Relation: Daughter  Code Status:  Full Code  Goals of care: Advanced Directive information Advanced Directives 06/08/2021  Does Patient Have a Medical Advance Directive? No  Type of Advance Directive -  Does patient want to make changes to medical advance directive? -  Copy of [place] in Chart? -  Would patient like information on creating a medical advance directive? No - Patient declined     Chief Complaint  Patient presents with   Medical Management of Chronic Issues    6 month follow up.   Health Maintenance    Discuss the need for Hemoglobin A1C.   Immunizations    Discuss the need for Shingrix vaccine, Flu vaccine, and Covid vaccine.     HPI:  Pt is a 60 y.o. male seen today for 6 months follow up for medical management of chronic diseases.Has a medical history of Hypertension,Type 2 DM,GERD,Dyslipidemia,Major depression,Insomnia,Vascular Dementia without any behavioral issues,Hx of stroke with left Hemiparesis,mobid Obesity among other conditions. He denies any acute issues today. His previous lab work reviewed and discussed with patient states fasting for labs today.Platelets 132 improved from 125 no signs of bleeding. Cholesterol,LDL has improved except TRG 179 improved from 227.Has been eating more bread. Hgb A1C 6.9, Glucose 109 previous 6.5 BUN 28,CR 1.34 previous 1.20  States not doing any form of exercise. B/p slightly high today though has not taken his blood pressure  medication.Fasting for labs.will take medication after visit.   Depression - feels much better on Prozac 10 mg capsule.request refill.He denies any suicide ideation or worsening of depression symptoms.   Insomnia - stays awake most of the time watching TV.does not feel tired since he feels like he is not doing anything to make him tired.Has sleep Aid   Reports no episodes of fall or Hospital admission.   Has had weight loss 69 lbs over seven months.He cut down on drinking sodas except once in a while. Also Air fries food instead of eating deep fried food.   He is due for COVID-19 vaccine and Shingrix importance of vaccine discussed.Advised to get vaccine at his Pharmacy.    Past Medical History:  Diagnosis Date   Acute respiratory failure (HCC)    Cerebral edema (HCC)    Diabetes mellitus without complication ([place])    diet controlled- no meds   Dysphagia as late effect of cerebrovascular disease    Fall at home    Hemiparesis affecting left side as late effect of stroke ([place])    Hyperlipidemia    Hypertension    Stroke ([place]) 2016   Tuberculosis    11 yrs ago per pt.- treated with meds per pt.   Past Surgical History:  Procedure Laterality Date   COLONOSCOPY  10/06/2016   ESOPHAGOGASTRODUODENOSCOPY N/A 08/12/2015   Procedure: ESOPHAGOGASTRODUODENOSCOPY (EGD);  Surgeon: Ladene Artist, MD;  Location: [facility] ENDOSCOPY;  Service: Endoscopy;  Laterality: N/A;   feeding tube removed     GASTROSTOMY TUBE PLACEMENT  05-19-15   mechanical thrombectomy angioplasty stenet placement     SKIN GRAFT  1980's   ankle   UPPER GASTROINTESTINAL ENDOSCOPY      No Known Allergies  Allergies as of 06/09/2021   No Known Allergies      Medication List        Accurate as of June 09, 2021  9:24 AM. If you have any questions, ask your nurse or doctor.          amLODipine 10 MG tablet Commonly known as: NORVASC Take 1 tablet (10 mg total) by mouth daily.   aspirin 325 MG tablet Take 1  tablet (325 mg total) by mouth daily.   atorvastatin 80 MG tablet Commonly known as: LIPITOR Take 0.5 tablets (40 mg total) by mouth daily.   cloNIDine 0.1 MG tablet Commonly known as: CATAPRES Take 1 tablet (0.1 mg total) by mouth every 8 (eight) hours as needed.   FLUoxetine 10 MG capsule Commonly known as: PROZAC TAKE 1 CAPSULE BY MOUTH EVERY DAY   hydrALAZINE 25 MG tablet Commonly known as: APRESOLINE TAKE 1 TABLET BY MOUTH THREE TIMES A DAY   OMEGA-3 FATTY ACIDS PO Take 2 Gm capsule  by mouth twice daily   pantoprazole 40 MG tablet Commonly known as: PROTONIX Take 1 tablet (40 mg total) by mouth 2 (two) times daily.   SLEEP-AID PO Take by mouth.        Review of Systems  Constitutional:  Negative for appetite change, chills, fatigue, fever and unexpected weight change.  HENT:  Negative for congestion, dental problem, ear discharge, ear pain, facial swelling, hearing loss, nosebleeds, postnasal drip, rhinorrhea, sinus pressure, sinus pain, sneezing, sore throat, tinnitus and trouble swallowing.   Eyes:  Negative for pain, discharge, redness, itching and visual disturbance.  Respiratory:  Negative for cough, chest tightness, shortness of breath and wheezing.   Cardiovascular:  Negative for chest pain, palpitations and leg swelling.  Gastrointestinal:  Negative for abdominal distention, abdominal pain, blood in stool, constipation, diarrhea, nausea and vomiting.  Endocrine: Negative for cold intolerance, heat intolerance, polydipsia, polyphagia and polyuria.  Genitourinary:  Negative for difficulty urinating, dysuria, flank pain, frequency and urgency.  Musculoskeletal:  Positive for gait problem. Negative for arthralgias, back pain, joint swelling, myalgias, neck pain and neck stiffness.  Skin:  Negative for color change, pallor, rash and wound.  Neurological:  Negative for dizziness, syncope, speech difficulty, weakness, light-headedness, numbness and headaches.   Hematological:  Does not bruise/bleed easily.  Psychiatric/Behavioral:  Negative for agitation, behavioral problems, confusion, hallucinations, self-injury, sleep disturbance and suicidal ideas. The patient is not nervous/anxious.    Immunization History  Administered Date(s) Administered   Influenza,inj,Quad PF,6+ Mos 08/10/2015, 09/22/2016, 07/29/2017, 11/27/2018, 09/28/2019, 12/08/2020   Pneumococcal Conjugate-13 01/16/2019   Pneumococcal Polysaccharide-23 12/10/2016   Tdap 01/16/2019   Pertinent  Health Maintenance Due  Topic Date Due   HEMOGLOBIN A1C  11/22/2020   INFLUENZA VACCINE  06/08/2021   OPHTHALMOLOGY EXAM  07/28/2021   FOOT EXAM  08/08/2021   COLONOSCOPY (Pts 45-26yr Insurance coverage will need to be confirmed)  03/24/2023   Fall Risk  06/08/2021 12/08/2020 05/27/2020 03/07/2020 01/23/2020  Falls in the past year? 0 0 0 0 0  Number falls in past yr: 0 0 0 - 0  Injury with Fall? 0 0 0 - 0  Risk for fall due to : No Fall Risks - - - -  Follow up Falls evaluation completed - - - -   Functional Status Survey:    Vitals:   06/09/21 0917  BP: (!) 150/90  Pulse: (!) 55  Resp: 18  Temp: (!) 97.5 F (36.4 C)  SpO2: 90%  Weight: 230 lb 9.6 oz (104.6 kg)  Height: _0  (1.88 m)   Body mass index is 29.61 kg/m. Physical Exam Vitals reviewed.  Constitutional:      General: He is not in acute distress.    Appearance: Normal appearance. He is overweight. He is not ill-appearing or diaphoretic.  HENT:     Head: Normocephalic.     Right Ear: Tympanic membrane, ear canal and external ear normal. There is no impacted cerumen.     Left Ear: Tympanic membrane, ear canal and external ear normal. There is no impacted cerumen.     Nose: Nose normal. No congestion or rhinorrhea.     Mouth/Throat:     Mouth: Mucous membranes are moist.     Pharynx: Oropharynx is clear. No oropharyngeal exudate or posterior oropharyngeal erythema.     Comments: Missing several teeth  Eyes:      General: No scleral icterus.       Right eye: No discharge.        Left eye: No discharge.     Extraocular Movements: Extraocular movements intact.     Conjunctiva/sclera: Conjunctivae normal.     Pupils: Pupils are equal, round, and reactive to light.  Neck:     Vascular: No carotid bruit.  Cardiovascular:     Rate and Rhythm: Normal rate and regular rhythm.     Pulses: Normal pulses.     Heart sounds: Normal heart sounds. No murmur heard.   No friction rub. No gallop.  Pulmonary:     Effort: Pulmonary effort is normal. No respiratory distress.     Breath sounds: Normal breath sounds. No wheezing, rhonchi or rales.  Chest:     Chest wall: No tenderness.  Abdominal:     General: Bowel sounds are normal. There is no distension.     Palpations: Abdomen is soft. There is no mass.     Tenderness: There is no abdominal tenderness. There is no right CVA tenderness, left CVA tenderness, guarding or rebound.  Musculoskeletal:        General: No swelling or tenderness.     Right hand: Normal.     Left hand: Deformity present. No swelling or tenderness. Decreased range of motion. Normal strength. Normal sensation. Normal capillary refill. Normal pulse.     Cervical back: Normal range of motion. No rigidity or tenderness.     Right lower leg: No edema.     Left lower leg: No edema.  Lymphadenopathy:     Cervical: No cervical adenopathy.  Skin:    General: Skin is warm and dry.     Coloration: Skin is not pale.     Findings: Lesion present. No bruising, erythema or rash.     Comments: Left calf raised pea size skin lesion non-tender to palpation and without any erythema   Neurological:     Mental Status: He is alert and oriented to person, place, and time.     Cranial Nerves: No cranial nerve deficit.     Sensory: No sensory deficit.     Motor: No weakness.     Coordination: Coordination normal.     Gait: Gait abnormal.     Comments: Left hand contracture   Psychiatric:        Mood  and Affect: Mood normal.        Speech: Speech normal.  Behavior: Behavior normal.        Thought Content: Thought content normal.        Judgment: Judgment normal.    Labs reviewed: No results for input(s): NA, K, CL, CO2, GLUCOSE, BUN, CREATININE, CALCIUM, MG, PHOS in the last 8760 hours. No results for input(s): AST, ALT, ALKPHOS, BILITOT, PROT, ALBUMIN in the last 8760 hours. No results for input(s): WBC, NEUTROABS, HGB, HCT, MCV, PLT in the last 8760 hours. Lab Results  Component Value Date   TSH 2.62 05/22/2020   Lab Results  Component Value Date   HGBA1C 6.9 (H) 05/22/2020   Lab Results  Component Value Date   CHOL 146 05/22/2020   HDL 43 05/22/2020   LDLCALC 75 05/22/2020   TRIG 179 (H) 05/22/2020   CHOLHDL 3.4 05/22/2020    Significant Diagnostic Results in last 30 days:  No results found.  Assessment/Plan 1. Essential hypertension B/p not at goal today.Not taken his medication. Previous readings at goal. Advised to monitor B/p at home and notify provider if still > 140/90  - continue on amlodipine ,clonidine and Hydralazine. - On ASA and Statin  - CBC with Differential/Platelet - CMP with eGFR([facility]) - TSH - Lipid Panel  2. Type 2 diabetes mellitus without complication, without long-term current use of insulin (HCC) Lab Results  Component Value Date   HGBA1C 6.9 (H) 05/22/2020  Diet controlled  Advised to continue with dietary modification and exercise at least three times per week for 30 minutes - continue on statin  Annual eye exam up to date.continue to follow up with Dr.Grout - will refer to podiatrist to trim toenail  - Hemoglobin A1c  3. Gastroesophageal reflux disease without esophagitis Symptoms controlled on Protonix  Reports no signs of GI bleed   4. Dyslipidemia LDL at gaol  Continue on atorvastatin  -continue dietary modification and increase exercise as above    5. Vascular dementia without behavioral disturbance (HCC) No new  behavioral issues.  6. H/O: stroke with residual effects Continue on atorvastatin  - continue to control high risk factors.He quit smoking  7. Primary insomnia Continue on doxylamine succinate sleep  - sleep hygiene discussed to switch off electronic at least one hour before bedtime   8. Current moderate episode of major depressive disorder, unspecified whether recurrent (HCC) Mood stable  Continue on Fluoxetine  - FLUoxetine (PROZAC) 10 MG capsule; TAKE 1 CAPSULE BY MOUTH EVERY DAY  Dispense: 90 capsule; Refill: 1  9. Overgrown toenails Bilateral toenails overgrown.refer to Podiatrist to trim toenail - Ambulatory referral to Podiatry  10. Hemiparesis affecting left side as late effect of stroke (HCC) Left hand contracture.No pain reported.  Family assist with ADL's   2. Over weight BMI 29.6  Continue dietary modification and exercise   12. Skin lesion of left leg Left calf raised pea size skin lesion non-tender to palpation and without any erythema  - Ambulatory referral to Dermatology   13. BMI 29.0-29.9,adult BMI 29.6  Has improved previous BMI 38.47  Has cut down on soda's and fried foods using [employer] instead of deep frying. Continue with dietary modification and exercise    Family/ staff Communication: Reviewed plan of care with patient verbalized understanding  Labs/tests ordered:  - CBC with Differential/Platelet - CMP with eGFR([facility]) - TSH - Lipid Panel - Hemoglobin A1C  Next Appointment : 6 months for medical management of chronic issues.  Sandrea Hughs, NP

## 2021-06-10 LAB — CBC WITH DIFFERENTIAL/PLATELET
Absolute Monocytes: 347 cells/uL (ref 200–950)
Basophils Absolute: 20 cells/uL (ref 0–200)
Basophils Relative: 0.5 %
Eosinophils Absolute: 51 cells/uL (ref 15–500)
Eosinophils Relative: 1.3 %
HCT: 46.1 % (ref 38.5–50.0)
Hemoglobin: 14.8 g/dL (ref 13.2–17.1)
Lymphs Abs: 1334 cells/uL (ref 850–3900)
MCH: 28.5 pg (ref 27.0–33.0)
MCHC: 32.1 g/dL (ref 32.0–36.0)
MCV: 88.8 fL (ref 80.0–100.0)
MPV: 13.5 fL — ABNORMAL HIGH (ref 7.5–12.5)
Monocytes Relative: 8.9 %
Neutro Abs: 2149 cells/uL (ref 1500–7800)
Neutrophils Relative %: 55.1 %
Platelets: 134 10*3/uL — ABNORMAL LOW (ref 140–400)
RBC: 5.19 10*6/uL (ref 4.20–5.80)
RDW: 14.3 % (ref 11.0–15.0)
Total Lymphocyte: 34.2 %
WBC: 3.9 10*3/uL (ref 3.8–10.8)

## 2021-06-10 LAB — COMPLETE METABOLIC PANEL WITH GFR
AG Ratio: 1.4 (calc) (ref 1.0–2.5)
ALT: 17 U/L (ref 9–46)
AST: 15 U/L (ref 10–35)
Albumin: 4.6 g/dL (ref 3.6–5.1)
Alkaline phosphatase (APISO): 105 U/L (ref 35–144)
BUN: 16 mg/dL (ref 7–25)
CO2: 30 mmol/L (ref 20–32)
Calcium: 9.8 mg/dL (ref 8.6–10.3)
Chloride: 104 mmol/L (ref 98–110)
Creat: 1.2 mg/dL (ref 0.70–1.35)
Globulin: 3.3 g/dL (calc) (ref 1.9–3.7)
Glucose, Bld: 112 mg/dL — ABNORMAL HIGH (ref 65–99)
Potassium: 4.2 mmol/L (ref 3.5–5.3)
Sodium: 141 mmol/L (ref 135–146)
Total Bilirubin: 0.5 mg/dL (ref 0.2–1.2)
Total Protein: 7.9 g/dL (ref 6.1–8.1)
eGFR: 68 mL/min/{1.73_m2} (ref >=60)

## 2021-06-10 LAB — HEMOGLOBIN A1C
Hgb A1c MFr Bld: 6.3 % of total Hgb — ABNORMAL HIGH (ref <5.7)
Mean Plasma Glucose: 134 mg/dL
eAG (mmol/L): 7.4 mmol/L

## 2021-06-10 LAB — LIPID PANEL
Cholesterol: 143 mg/dL (ref <200)
HDL: 53 mg/dL (ref >=40)
LDL Cholesterol (Calc): 71 mg/dL (calc)
Non-HDL Cholesterol (Calc): 90 mg/dL (calc) (ref <130)
Total CHOL/HDL Ratio: 2.7 (calc) (ref <5.0)
Triglycerides: 100 mg/dL (ref <150)

## 2021-06-10 LAB — TSH: TSH: 1.58 mIU/L (ref 0.40–4.50)

## 2021-06-11 ENCOUNTER — Other Ambulatory Visit: Payer: Self-pay

## 2021-06-11 DIAGNOSIS — E785 Hyperlipidemia, unspecified: Secondary | ICD-10-CM

## 2021-06-11 DIAGNOSIS — I10 Essential (primary) hypertension: Secondary | ICD-10-CM

## 2021-06-11 DIAGNOSIS — E119 Type 2 diabetes mellitus without complications: Secondary | ICD-10-CM

## 2021-06-12 ENCOUNTER — Other Ambulatory Visit: Payer: Self-pay | Admitting: Family

## 2021-06-12 DIAGNOSIS — I10 Essential (primary) hypertension: Secondary | ICD-10-CM

## 2021-06-12 DIAGNOSIS — K219 Gastro-esophageal reflux disease without esophagitis: Secondary | ICD-10-CM

## 2021-07-17 ENCOUNTER — Encounter: Payer: Medicare HMO | Admitting: Family

## 2021-07-17 ENCOUNTER — Other Ambulatory Visit: Payer: Self-pay

## 2021-07-17 ENCOUNTER — Encounter: Payer: Self-pay | Admitting: Family

## 2021-07-17 DIAGNOSIS — Z0000 Encounter for general adult medical examination without abnormal findings: Secondary | ICD-10-CM

## 2021-07-17 NOTE — Progress Notes (Signed)
This encounter was created in error - please disregard.

## 2021-09-09 ENCOUNTER — Other Ambulatory Visit: Payer: Self-pay | Admitting: Family

## 2021-09-09 DIAGNOSIS — I10 Essential (primary) hypertension: Secondary | ICD-10-CM

## 2021-11-21 ENCOUNTER — Other Ambulatory Visit: Payer: Self-pay | Admitting: Family

## 2021-11-21 DIAGNOSIS — E1149 Type 2 diabetes mellitus with other diabetic neurological complication: Secondary | ICD-10-CM

## 2021-11-21 DIAGNOSIS — E1169 Type 2 diabetes mellitus with other specified complication: Secondary | ICD-10-CM

## 2021-11-21 DIAGNOSIS — I10 Essential (primary) hypertension: Secondary | ICD-10-CM

## 2021-11-21 DIAGNOSIS — E785 Hyperlipidemia, unspecified: Secondary | ICD-10-CM

## 2021-11-23 ENCOUNTER — Other Ambulatory Visit: Payer: Self-pay | Admitting: Family

## 2021-11-23 DIAGNOSIS — I10 Essential (primary) hypertension: Secondary | ICD-10-CM

## 2021-12-03 DIAGNOSIS — R69 Illness, unspecified: Secondary | ICD-10-CM | POA: Diagnosis not present

## 2021-12-03 DIAGNOSIS — K006 Disturbances in tooth eruption: Secondary | ICD-10-CM | POA: Diagnosis not present

## 2021-12-06 ENCOUNTER — Other Ambulatory Visit: Payer: Self-pay | Admitting: Family

## 2021-12-06 DIAGNOSIS — K219 Gastro-esophageal reflux disease without esophagitis: Secondary | ICD-10-CM

## 2021-12-15 ENCOUNTER — Other Ambulatory Visit: Payer: Self-pay

## 2021-12-15 ENCOUNTER — Other Ambulatory Visit: Payer: Medicare HMO

## 2021-12-15 DIAGNOSIS — I10 Essential (primary) hypertension: Secondary | ICD-10-CM

## 2021-12-15 DIAGNOSIS — E119 Type 2 diabetes mellitus without complications: Secondary | ICD-10-CM

## 2021-12-15 DIAGNOSIS — E785 Hyperlipidemia, unspecified: Secondary | ICD-10-CM | POA: Diagnosis not present

## 2021-12-16 LAB — CBC WITH DIFFERENTIAL/PLATELET
Absolute Monocytes: 582 cells/uL (ref 200–950)
Basophils Absolute: 31 cells/uL (ref 0–200)
Basophils Relative: 0.6 %
Eosinophils Absolute: 224 cells/uL (ref 15–500)
Eosinophils Relative: 4.3 %
HCT: 43.3 % (ref 38.5–50.0)
Hemoglobin: 14.1 g/dL (ref 13.2–17.1)
Lymphs Abs: 1773 cells/uL (ref 850–3900)
MCH: 28.7 pg (ref 27.0–33.0)
MCHC: 32.6 g/dL (ref 32.0–36.0)
MCV: 88 fL (ref 80.0–100.0)
MPV: 13.8 fL — ABNORMAL HIGH (ref 7.5–12.5)
Monocytes Relative: 11.2 %
Neutro Abs: 2590 cells/uL (ref 1500–7800)
Neutrophils Relative %: 49.8 %
Platelets: 137 10*3/uL — ABNORMAL LOW (ref 140–400)
RBC: 4.92 10*6/uL (ref 4.20–5.80)
RDW: 14 % (ref 11.0–15.0)
Total Lymphocyte: 34.1 %
WBC: 5.2 10*3/uL (ref 3.8–10.8)

## 2021-12-16 LAB — LIPID PANEL
Cholesterol: 128 mg/dL (ref <200)
HDL: 44 mg/dL (ref >=40)
LDL Cholesterol (Calc): 63 mg/dL (calc)
Non-HDL Cholesterol (Calc): 84 mg/dL (calc) (ref <130)
Total CHOL/HDL Ratio: 2.9 (calc) (ref <5.0)
Triglycerides: 120 mg/dL (ref <150)

## 2021-12-16 LAB — HEMOGLOBIN A1C
Hgb A1c MFr Bld: 6.4 % of total Hgb — ABNORMAL HIGH (ref <5.7)
Mean Plasma Glucose: 137 mg/dL
eAG (mmol/L): 7.6 mmol/L

## 2021-12-16 LAB — COMPLETE METABOLIC PANEL WITH GFR
AG Ratio: 1.3 (calc) (ref 1.0–2.5)
ALT: 18 U/L (ref 9–46)
AST: 18 U/L (ref 10–35)
Albumin: 4.2 g/dL (ref 3.6–5.1)
Alkaline phosphatase (APISO): 107 U/L (ref 35–144)
BUN: 14 mg/dL (ref 7–25)
CO2: 28 mmol/L (ref 20–32)
Calcium: 9.7 mg/dL (ref 8.6–10.3)
Chloride: 107 mmol/L (ref 98–110)
Creat: 1.14 mg/dL (ref 0.70–1.35)
Globulin: 3.3 g/dL (calc) (ref 1.9–3.7)
Glucose, Bld: 105 mg/dL — ABNORMAL HIGH (ref 65–99)
Potassium: 4.5 mmol/L (ref 3.5–5.3)
Sodium: 143 mmol/L (ref 135–146)
Total Bilirubin: 0.5 mg/dL (ref 0.2–1.2)
Total Protein: 7.5 g/dL (ref 6.1–8.1)
eGFR: 72 mL/min/{1.73_m2} (ref >=60)

## 2021-12-16 LAB — TSH: TSH: 2.67 mIU/L (ref 0.40–4.50)

## 2021-12-17 ENCOUNTER — Encounter: Payer: Self-pay | Admitting: Family

## 2021-12-17 ENCOUNTER — Other Ambulatory Visit: Payer: Self-pay

## 2021-12-17 ENCOUNTER — Ambulatory Visit (INDEPENDENT_AMBULATORY_CARE_PROVIDER_SITE_OTHER): Payer: Medicare HMO | Admitting: Family

## 2021-12-17 VITALS — BP 128/80 | HR 56 | Temp 97.5°F | Resp 18 | Ht 74.0 in | Wt 282.2 lb

## 2021-12-17 DIAGNOSIS — K219 Gastro-esophageal reflux disease without esophagitis: Secondary | ICD-10-CM

## 2021-12-17 DIAGNOSIS — E119 Type 2 diabetes mellitus without complications: Secondary | ICD-10-CM | POA: Diagnosis not present

## 2021-12-17 DIAGNOSIS — F321 Major depressive disorder, single episode, moderate: Secondary | ICD-10-CM | POA: Diagnosis not present

## 2021-12-17 DIAGNOSIS — F5101 Primary insomnia: Secondary | ICD-10-CM | POA: Diagnosis not present

## 2021-12-17 DIAGNOSIS — E785 Hyperlipidemia, unspecified: Secondary | ICD-10-CM | POA: Diagnosis not present

## 2021-12-17 DIAGNOSIS — I10 Essential (primary) hypertension: Secondary | ICD-10-CM | POA: Diagnosis not present

## 2021-12-17 DIAGNOSIS — E1169 Type 2 diabetes mellitus with other specified complication: Secondary | ICD-10-CM | POA: Diagnosis not present

## 2021-12-17 DIAGNOSIS — Z23 Encounter for immunization: Secondary | ICD-10-CM | POA: Diagnosis not present

## 2021-12-17 MED ORDER — HYDRALAZINE HCL 25 MG PO TABS: 25.0000 mg | ORAL_TABLET | Freq: Three times a day (TID) | ORAL | 1 refills | Status: DC

## 2021-12-17 MED ORDER — ZOSTER VAC RECOMB ADJUVANTED 50 MCG/0.5ML IM SUSR: 0.5000 mL | Freq: Once | INTRAMUSCULAR | 0 refills | Status: AC

## 2021-12-17 NOTE — Progress Notes (Signed)
Provider: Marlowe Sax FNP-C   Renly Guedes, Nelda Bucks, NP  Patient Care Team: Tenaya Hilyer, Nelda Bucks, NP as PCP - General (Family [facility]) [facility], [place] ([place])  Extended Emergency Contact Information Primary Emergency Contact: Kraska,Yavonda Mobile Phone: [phone] Relation: Daughter Secondary Emergency Contact: [name],Nicole Address: [address]          [place], [place] [address] Johnnette Litter of [place] Mobile Phone: [phone] Relation: Daughter  Code Status:  Full Code  Goals of care: Advanced Directive information Advanced Directives 12/17/2021  Does Patient Have a Medical Advance Directive? No  Type of Advance Directive -  Does patient want to make changes to medical advance directive? -  Copy of [place] in Chart? -  Would patient like information on creating a medical advance directive? No - Patient declined     Chief Complaint  Patient presents with   Medical Management of Chronic Issues    6 month follow up   Health Maintenance    Discuss the need for Foot exam, and Eye exam.   Immunizations    Discuss the need for Shingrix vaccine, Covid vaccine, and Influenza vaccine.    HPI:  Pt is a 61 y.o. male seen today for 6 months follow up for medical management of chronic diseases.  Labs reviewed reviewed and discussed during visit unremarkable except glucose was 105,Hgb A1C 6.4, Plts 137 improved from previous 134  Drinks one Mnimaid per day.continue to follow up with Dietician. Has had a 51 lbs weight loss over 6 months. No home CBG log for review. Follows up with Dr.Groat for eye exam.not seen in one year   States trying to wean off Prozac.does not feel depressed anymore.states took Prozac this morning.discussed proper weaning off Prozac then notify provider if symptoms.  Due for shingles ,Influenza and COVID-19 vaccine.    Past Medical History:  Diagnosis Date   Acute respiratory failure (HCC)    Cerebral  edema (HCC)    Diabetes mellitus without complication ([place])    diet controlled- no meds   Dysphagia as late effect of cerebrovascular disease    Fall at home    Hemiparesis affecting left side as late effect of stroke ([place])    Hyperlipidemia    Hypertension    Stroke ([place]) 2016   Tuberculosis    11 yrs ago per pt.- treated with meds per pt.   Past Surgical History:  Procedure Laterality Date   COLONOSCOPY  10/06/2016   ESOPHAGOGASTRODUODENOSCOPY N/A 08/12/2015   Procedure: ESOPHAGOGASTRODUODENOSCOPY (EGD);  Surgeon: Ladene Artist, MD;  Location: [facility] ENDOSCOPY;  Service: Endoscopy;  Laterality: N/A;   feeding tube removed     GASTROSTOMY TUBE PLACEMENT  05-19-15   mechanical thrombectomy angioplasty stenet placement     SKIN GRAFT  1980's   ankle   UPPER GASTROINTESTINAL ENDOSCOPY      No Known Allergies  Allergies as of 12/17/2021   No Known Allergies      Medication List        Accurate as of December 17, 2021 10:43 AM. If you have any questions, ask your nurse or doctor.          amLODipine 10 MG tablet Commonly known as: NORVASC Take 1 tablet (10 mg total) by mouth daily. I10   amoxicillin 500 MG capsule Commonly known as: AMOXIL Take 500 mg by mouth every 8 (eight) hours.   aspirin 325 MG tablet Take 1 tablet (325 mg total) by mouth daily.  atorvastatin 80 MG tablet Commonly known as: LIPITOR TAKE 0.5 TABLETS BY MOUTH DAILY.   cloNIDine 0.1 MG tablet Commonly known as: CATAPRES TAKE 1 TABLET (0.1 MG TOTAL) BY MOUTH EVERY 8 (EIGHT) HOURS AS NEEDED.   FLUoxetine 10 MG capsule Commonly known as: PROZAC TAKE 1 CAPSULE BY MOUTH EVERY DAY   hydrALAZINE 25 MG tablet Commonly known as: APRESOLINE TAKE 1 TABLET BY MOUTH THREE TIMES A DAY   ibuprofen 800 MG tablet Commonly known as: ADVIL Take 800 mg by mouth every 6 (six) hours as needed.   OMEGA-3 FATTY ACIDS PO Take 2 Gm capsule  by mouth twice daily   pantoprazole 40 MG tablet Commonly known  as: PROTONIX TAKE 1 TABLET BY MOUTH TWICE A DAY   SLEEP-AID PO Take by mouth.   Zoster Vaccine Adjuvanted injection Commonly known as: SHINGRIX Inject 0.5 mLs into the muscle once.        Review of Systems  Constitutional:  Negative for appetite change, chills, fatigue, fever and unexpected weight change.  HENT:  Negative for congestion, dental problem, ear discharge, ear pain, facial swelling, hearing loss, nosebleeds, postnasal drip, rhinorrhea, sinus pressure, sinus pain, sneezing, sore throat, tinnitus and trouble swallowing.   Eyes:  Negative for pain, discharge, redness, itching and visual disturbance.  Respiratory:  Negative for cough, chest tightness, shortness of breath and wheezing.   Cardiovascular:  Negative for chest pain, palpitations and leg swelling.  Gastrointestinal:  Negative for abdominal distention, abdominal pain, blood in stool, constipation, diarrhea, nausea and vomiting.  Endocrine: Negative for cold intolerance, heat intolerance, polydipsia, polyphagia and polyuria.  Genitourinary:  Negative for difficulty urinating, dysuria, flank pain, frequency and urgency.  Musculoskeletal:  Negative for arthralgias, back pain, gait problem, joint swelling, myalgias, neck pain and neck stiffness.  Skin:  Negative for color change, pallor, rash and wound.  Neurological:  Positive for weakness. Negative for dizziness, syncope, speech difficulty, light-headedness, numbness and headaches.       Left side hand weakness   Hematological:  Does not bruise/bleed easily.  Psychiatric/Behavioral:  Negative for agitation, behavioral problems, confusion, hallucinations, self-injury, sleep disturbance and suicidal ideas. The patient is not nervous/anxious.    Immunization History  Administered Date(s) Administered   Fluad Quad(high Dose 65+) 12/17/2021   Influenza,inj,Quad PF,6+ Mos 08/10/2015, 09/22/2016, 07/29/2017, 11/27/2018, 09/28/2019, 12/08/2020   Pneumococcal Conjugate-13  01/16/2019   Pneumococcal Polysaccharide-23 12/10/2016   Tdap 01/16/2019   Pertinent  Health Maintenance Due  Topic Date Due   OPHTHALMOLOGY EXAM  07/28/2021   FOOT EXAM  08/08/2021   HEMOGLOBIN A1C  06/14/2022   COLONOSCOPY (Pts 45-1yr Insurance coverage will need to be confirmed)  03/24/2023   INFLUENZA VACCINE  Completed   Fall Risk 05/27/2020 12/08/2020 06/08/2021 07/17/2021 12/17/2021  Falls in the past year? 0 0 0 0 0  Was there an injury with Fall? 0 0 0 0 0  Fall Risk Category Calculator 0 0 0 0 0  Fall Risk Category Low Low Low Low Low  Patient Fall Risk Level Low fall risk Low fall risk Low fall risk Low fall risk Low fall risk  Patient at Risk for Falls Due to - - No Fall Risks No Fall Risks No Fall Risks  Fall risk Follow up - - Falls evaluation completed Falls evaluation completed Falls evaluation completed   Functional Status Survey:    Vitals:   12/17/21 1028  BP: 128/80  Pulse: (!) 56  Resp: 18  Temp: (!) 97.5 F (36.4 C)  SpO2: 97%  Weight: 282 lb 3.2 oz (128 kg)  Height: 6' 2"  (1.88 m)   Body mass index is 36.23 kg/m. Physical Exam Vitals reviewed.  Constitutional:      General: He is not in acute distress.    Appearance: Normal appearance. He is obese. He is not ill-appearing or diaphoretic.  HENT:     Head: Normocephalic.     Right Ear: Tympanic membrane, ear canal and external ear normal. There is no impacted cerumen.     Left Ear: Tympanic membrane, ear canal and external ear normal. There is no impacted cerumen.     Nose: Nose normal. No congestion or rhinorrhea.     Mouth/Throat:     Mouth: Mucous membranes are moist.     Pharynx: Oropharynx is clear. No oropharyngeal exudate or posterior oropharyngeal erythema.  Eyes:     General: No scleral icterus.       Right eye: No discharge.        Left eye: No discharge.     Extraocular Movements: Extraocular movements intact.     Conjunctiva/sclera: Conjunctivae normal.     Pupils: Pupils are equal,  round, and reactive to light.  Neck:     Vascular: No carotid bruit.  Cardiovascular:     Rate and Rhythm: Normal rate and regular rhythm.     Pulses: Normal pulses.     Heart sounds: Normal heart sounds. No murmur heard.   No friction rub. No gallop.  Pulmonary:     Effort: Pulmonary effort is normal. No respiratory distress.     Breath sounds: Normal breath sounds. No wheezing, rhonchi or rales.  Chest:     Chest wall: No tenderness.  Abdominal:     General: Bowel sounds are normal. There is no distension.     Palpations: Abdomen is soft. There is no mass.     Tenderness: There is no abdominal tenderness. There is no right CVA tenderness, left CVA tenderness, guarding or rebound.  Musculoskeletal:        General: No swelling or tenderness. Normal range of motion.     Cervical back: Normal range of motion. No rigidity or tenderness.     Right lower leg: No edema.     Left lower leg: No edema.     Comments: Left hand contracture   Lymphadenopathy:     Cervical: No cervical adenopathy.  Skin:    General: Skin is warm and dry.     Coloration: Skin is not pale.     Findings: No bruising, erythema, lesion or rash.  Neurological:     Mental Status: He is alert and oriented to person, place, and time.     Cranial Nerves: No cranial nerve deficit.     Sensory: No sensory deficit.     Motor: No weakness.     Coordination: Coordination normal.     Gait: Gait abnormal.  Psychiatric:        Mood and Affect: Mood normal.        Speech: Speech normal.        Behavior: Behavior normal.        Thought Content: Thought content normal.        Judgment: Judgment normal.    Labs reviewed: Recent Labs    06/09/21 1003 12/15/21 0820  NA 141 143  K 4.2 4.5  CL 104 107  CO2 30 28  GLUCOSE 112* 105*  BUN 16 14  CREATININE 1.20 1.14  CALCIUM 9.8 9.7  Recent Labs    06/09/21 1003 12/15/21 0820  AST 15 18  ALT 17 18  BILITOT 0.5 0.5  PROT 7.9 7.5   Recent Labs     06/09/21 1003 12/15/21 0820  WBC 3.9 5.2  NEUTROABS 2,149 2,590  HGB 14.8 14.1  HCT 46.1 43.3  MCV 88.8 88.0  PLT 134* 137*   Lab Results  Component Value Date   TSH 2.67 12/15/2021   Lab Results  Component Value Date   HGBA1C 6.4 (H) 12/15/2021   Lab Results  Component Value Date   CHOL 128 12/15/2021   HDL 44 12/15/2021   LDLCALC 63 12/15/2021   TRIG 120 12/15/2021   CHOLHDL 2.9 12/15/2021    Significant Diagnostic Results in last 30 days:  No results found.  Assessment/Plan 1. Need for influenza vaccination Flut shot administered by CMA no acute reaction reported.  - Flu Vaccine QUAD High Dose(Fluad)  2. Need for shingles vaccine Advised to get shingles vaccine at his pharmacy  - Zoster Vaccine Adjuvanted [facility]) injection; Inject 0.5 mLs into the muscle once for 1 dose.  Dispense: 0.5 mL; Refill: 0  3. Type 2 diabetes mellitus without complication, without long-term current use of insulin (HCC) Lab Results  Component Value Date   HGBA1C 6.4 (H) 12/15/2021   No home CBG for review  Diet controlled  - Lipid panel; Future - Hemoglobin A1c; Future  4. Essential hypertension B/p well controlled  Continue on hydralazine and clonidine  - hydrALAZINE (APRESOLINE) 25 MG tablet; Take 1 tablet (25 mg total) by mouth 3 (three) times daily.  Dispense: 270 tablet; Refill: 1 - CBC with Differential/Platelet; Future - CMP with eGFR([facility]); Future - TSH; Future - Lipid panel; Future  5. Gastroesophageal reflux disease without esophagitis Symptoms stable  Continue on Protonix  6. Dyslipidemia associated with type 2 diabetes mellitus (HCC) LDL controlled  Continue on atorvastatin and Omega  - Lipid panel; Future  7. Current moderate episode of major depressive disorder, unspecified whether recurrent ([place]) Mood stable  - continue fluoxetine  - TSH; Future  8. Primary insomnia Continue on sleep -Aid   Family/ staff Communication: Reviewed plan of care with  patient verbalized understanding   Labs/tests ordered:  - CBC with Differential/Platelet - CMP with eGFR([facility]) - TSH - Hgb A1C - Lipid panel  Next Appointment : 6 months for medical management of chronic issues.Fasting Labs prior to visit.    Sandrea Hughs, NP

## 2021-12-31 DIAGNOSIS — K006 Disturbances in tooth eruption: Secondary | ICD-10-CM | POA: Diagnosis not present

## 2021-12-31 DIAGNOSIS — R69 Illness, unspecified: Secondary | ICD-10-CM | POA: Diagnosis not present

## 2022-03-04 ENCOUNTER — Other Ambulatory Visit: Payer: Self-pay | Admitting: Family

## 2022-03-04 DIAGNOSIS — F321 Major depressive disorder, single episode, moderate: Secondary | ICD-10-CM

## 2022-03-04 NOTE — Telephone Encounter (Signed)
Patient has request refill on medication "Fluoxetine '10mg'$ ". Patient last refill 06/09/2021. Patient medication has High Risk Warnings Medication pend and sent to PCP Ngetich, Nelda Bucks, NP for approval.  ?

## 2022-06-15 NOTE — Patient Instructions (Signed)
Please contact your local pharmacy, previous provider, or insurance carrier for vaccine/immunization records. Ensure that any procedures done outside of [facility] are faxed to [place] [phone] or you can sign release of records form at the front desk to keep your medical record updated.    Please get your second shingles vaccine and COVID-19 booster vaccine.    - schedule follow up appointment with Dr.Mayer Belenda Cruise for annual diabetic examination and Dr.Groat for annual eye exam.   - Please schedule appointment with the dentist to evaluate infected right lower premolar infection.

## 2022-06-17 ENCOUNTER — Other Ambulatory Visit: Payer: Medicare HMO

## 2022-06-17 ENCOUNTER — Encounter: Payer: Self-pay | Admitting: Family

## 2022-06-17 ENCOUNTER — Ambulatory Visit (INDEPENDENT_AMBULATORY_CARE_PROVIDER_SITE_OTHER): Payer: Medicare (Managed Care) | Admitting: Family

## 2022-06-17 VITALS — BP 160/100 | HR 81 | Temp 97.6°F | Resp 18 | Ht 74.0 in | Wt 289.8 lb

## 2022-06-17 DIAGNOSIS — I69354 Hemiplegia and hemiparesis following cerebral infarction affecting left non-dominant side: Secondary | ICD-10-CM

## 2022-06-17 DIAGNOSIS — F321 Major depressive disorder, single episode, moderate: Secondary | ICD-10-CM | POA: Diagnosis not present

## 2022-06-17 DIAGNOSIS — K0530 Chronic periodontitis, unspecified: Secondary | ICD-10-CM | POA: Insufficient documentation

## 2022-06-17 DIAGNOSIS — E1169 Type 2 diabetes mellitus with other specified complication: Secondary | ICD-10-CM

## 2022-06-17 DIAGNOSIS — K219 Gastro-esophageal reflux disease without esophagitis: Secondary | ICD-10-CM | POA: Diagnosis not present

## 2022-06-17 DIAGNOSIS — I10 Essential (primary) hypertension: Secondary | ICD-10-CM | POA: Diagnosis not present

## 2022-06-17 DIAGNOSIS — E785 Hyperlipidemia, unspecified: Secondary | ICD-10-CM

## 2022-06-17 MED ORDER — AMOXICILLIN 500 MG PO CAPS: 500.0000 mg | ORAL_CAPSULE | Freq: Two times a day (BID) | ORAL | 0 refills | Status: AC

## 2022-06-17 NOTE — Assessment & Plan Note (Signed)
BMI 37.21 with associated comorbidities type 2 diabetes, dyslipidemia and hypertension

## 2022-06-17 NOTE — Progress Notes (Signed)
Provider: Marlowe Sax FNP-C   Zhane Bluitt, Nelda Bucks, NP  Patient Care Team: Tyreck Bell, Nelda Bucks, NP as PCP - [facility] ([facility]) [facility], [place] ([place])  Extended Emergency Contact Information Primary Emergency Contact: Kun,Yavonda Mobile Phone: [phone] Relation: Daughter Secondary Emergency Contact: [name],Nicole Address: [address]          [place], [place] [address] Johnnette Litter of [place] Mobile Phone: [phone] Relation: Daughter  Code Status:  Full Code  Goals of care: Advanced Directive information    06/17/2022    9:05 AM  Advanced Directives  Does Patient Have a Medical Advance Directive? No  Would patient like information on creating a medical advance directive? No - Patient declined     Chief Complaint  Patient presents with   Medical Management of Chronic Issues    6 month follow up.    Health Maintenance    Discuss the need for Eye exam, Foot exam, and Hemoglobin A1C.   Immunizations    Discuss the need for Covid Booster, Influenza vaccine, and 2nd Shingrix vaccine.     HPI:  Pt is a 61 y.o. male seen today for 6 months for medical management of chronic diseases.  Has medical history of hypertension, dyslipidemia, type 2 diabetes GERD, vascular dementia without any behavioral issues, major depression, history of stroke with left hemiparesis, morbid obesity among others.  Hypertension - B/P readings high on arrival has not taken medication due to fasting for labs will take medication after visit.No home blood pressure readings for review.denies any headache,dizziness,vision changes,fatigue,chest tightness,palpitation,chest pain or shortness of breath.       Type 2 DM - no home CBG for review though declines any signs of hypo/hyperglycemia.  Follows up with Dr.Mayer Belenda Cruise for annual foot exam.No recent visit  Also follows up with Dr.Groat for annual eye exam.No recent visit.  He due for Shingles and COVID-19  booster vaccine. Plans to get influenza vaccine in the Fall ,2023.  He complains of right lower jaw toothache which started last night.Has not seen the dentist. Has a dentist that he will follow up.  He denies any fever or chills  GERD - states symptoms controlled with Protonix   Past Medical History:  Diagnosis Date   Acute respiratory failure (HCC)    Cerebral edema (HCC)    Diabetes mellitus without complication ([place])    diet controlled- no meds   Dysphagia as late effect of cerebrovascular disease    Fall at home    Hemiparesis affecting left side as late effect of stroke ([place])    Hyperlipidemia    Hypertension    Stroke ([place]) 2016   Tuberculosis    11 yrs ago per pt.- treated with meds per pt.   Past Surgical History:  Procedure Laterality Date   COLONOSCOPY  10/06/2016   ESOPHAGOGASTRODUODENOSCOPY N/A 08/12/2015   Procedure: ESOPHAGOGASTRODUODENOSCOPY (EGD);  Surgeon: Ladene Artist, MD;  Location: [facility] ENDOSCOPY;  Service: Endoscopy;  Laterality: N/A;   feeding tube removed     GASTROSTOMY TUBE PLACEMENT  05-19-15   mechanical thrombectomy angioplasty stenet placement     SKIN GRAFT  1980's   ankle   UPPER GASTROINTESTINAL ENDOSCOPY      No Known Allergies  Allergies as of 06/17/2022   No Known Allergies      Medication List        Accurate as of June 17, 2022  9:10 AM. If you have any questions, ask your nurse or doctor.  STOP taking these medications    amoxicillin 500 MG capsule Commonly known as: AMOXIL Stopped by: Sandrea Hughs, NP       TAKE these medications    amLODipine 10 MG tablet Commonly known as: NORVASC Take 1 tablet (10 mg total) by mouth daily. I10   aspirin 325 MG tablet Take 1 tablet (325 mg total) by mouth daily.   atorvastatin 80 MG tablet Commonly known as: LIPITOR TAKE 0.5 TABLETS BY MOUTH DAILY.   cloNIDine 0.1 MG tablet Commonly known as: CATAPRES TAKE 1 TABLET (0.1 MG TOTAL) BY MOUTH EVERY 8 (EIGHT)  HOURS AS NEEDED.   FLUoxetine 10 MG capsule Commonly known as: PROZAC TAKE 1 CAPSULE BY MOUTH EVERY DAY   hydrALAZINE 25 MG tablet Commonly known as: APRESOLINE Take 1 tablet (25 mg total) by mouth 3 (three) times daily.   ibuprofen 800 MG tablet Commonly known as: ADVIL Take 800 mg by mouth every 6 (six) hours as needed.   OMEGA-3 FATTY ACIDS PO Take 2 Gm capsule  by mouth twice daily   pantoprazole 40 MG tablet Commonly known as: PROTONIX TAKE 1 TABLET BY MOUTH TWICE A DAY   SLEEP-AID PO Take by mouth.   TYLENOL PO Take 1 tablet by mouth as needed (Pain).        Review of Systems  Constitutional:  Negative for appetite change, chills, fatigue, fever and unexpected weight change.  HENT:  Negative for congestion, dental problem, ear discharge, ear pain, facial swelling, hearing loss, nosebleeds, postnasal drip, rhinorrhea, sinus pressure, sinus pain, sneezing, sore throat, tinnitus and trouble swallowing.   Eyes:  Negative for pain, discharge, redness, itching and visual disturbance.  Respiratory:  Negative for cough, chest tightness, shortness of breath and wheezing.   Cardiovascular:  Negative for chest pain, palpitations and leg swelling.  Gastrointestinal:  Negative for abdominal distention, abdominal pain, blood in stool, constipation, diarrhea, nausea and vomiting.  Endocrine: Negative for cold intolerance, heat intolerance, polydipsia, polyphagia and polyuria.  Genitourinary:  Negative for difficulty urinating, dysuria, flank pain, frequency and urgency.  Musculoskeletal:  Positive for gait problem. Negative for arthralgias, back pain, joint swelling, myalgias, neck pain and neck stiffness.  Skin:  Negative for color change, pallor, rash and wound.  Neurological:  Positive for weakness and numbness. Negative for dizziness, syncope, speech difficulty, light-headedness and headaches.       Left hand weakness   Hematological:  Does not bruise/bleed easily.   Psychiatric/Behavioral:  Negative for agitation, behavioral problems, confusion, hallucinations, self-injury, sleep disturbance and suicidal ideas. The patient is not nervous/anxious.     Immunization History  Administered Date(s) Administered   Fluad Quad(high Dose 65+) 12/17/2021   Influenza,inj,Quad PF,6+ Mos 08/10/2015, 09/22/2016, 07/29/2017, 11/27/2018, 09/28/2019, 12/08/2020   Pneumococcal Conjugate-13 01/16/2019   Pneumococcal Polysaccharide-23 12/10/2016   Td 01/16/2019   Tdap 01/16/2019   Zoster Recombinat (Shingrix) 12/17/2021   Pertinent  Health Maintenance Due  Topic Date Due   OPHTHALMOLOGY EXAM  07/28/2021   FOOT EXAM  08/08/2021   INFLUENZA VACCINE  06/08/2022   HEMOGLOBIN A1C  06/14/2022   COLONOSCOPY (Pts 45-55yr Insurance coverage will need to be confirmed)  03/24/2023      12/08/2020    9:14 AM 06/08/2021    4:45 PM 07/17/2021    3:22 PM 12/17/2021   10:05 AM 06/17/2022    9:05 AM  Fall Risk  Falls in the past year? 0 0 0 0 0  Was there an injury with Fall? 0 0 0  0 0  Fall Risk Category Calculator 0 0 0 0 0  Fall Risk Category Low Low Low Low Low  Patient Fall Risk Level Low fall risk Low fall risk Low fall risk Low fall risk Low fall risk  Patient at Risk for Falls Due to  No Fall Risks No Fall Risks No Fall Risks No Fall Risks  Fall risk Follow up  Falls evaluation completed Falls evaluation completed Falls evaluation completed Falls evaluation completed   Functional Status Survey:    Vitals:   06/17/22 0859  BP: (!) 160/100  Pulse: 81  Resp: 18  Temp: 97.6 F (36.4 C)  SpO2: 96%  Weight: 289 lb 12.8 oz (131.5 kg)  Height: '6\' 2"'$  (1.88 m)   Body mass index is 37.21 kg/m. Physical Exam Vitals reviewed.  Constitutional:      General: He is not in acute distress.    Appearance: Normal appearance. He is obese. He is not ill-appearing or diaphoretic.  HENT:     Head: Normocephalic.     Right Ear: Tympanic membrane, ear canal and external ear  normal. There is no impacted cerumen.     Left Ear: Tympanic membrane, ear canal and external ear normal. There is no impacted cerumen.     Nose: Nose normal. No congestion or rhinorrhea.     Mouth/Throat:     Mouth: Mucous membranes are moist.     Pharynx: Oropharynx is clear. No oropharyngeal exudate or posterior oropharyngeal erythema.  Eyes:     General: No scleral icterus.       Right eye: No discharge.        Left eye: No discharge.     Extraocular Movements: Extraocular movements intact.     Conjunctiva/sclera: Conjunctivae normal.     Pupils: Pupils are equal, round, and reactive to light.  Neck:     Vascular: No carotid bruit.  Cardiovascular:     Rate and Rhythm: Normal rate and regular rhythm.     Pulses: Normal pulses.     Heart sounds: Normal heart sounds. No murmur heard.    No friction rub. No gallop.  Pulmonary:     Effort: Pulmonary effort is normal. No respiratory distress.     Breath sounds: Normal breath sounds. No wheezing, rhonchi or rales.  Chest:     Chest wall: No tenderness.  Abdominal:     General: Bowel sounds are normal. There is no distension.     Palpations: Abdomen is soft. There is no mass.     Tenderness: There is no abdominal tenderness. There is no right CVA tenderness, left CVA tenderness, guarding or rebound.  Musculoskeletal:        General: No swelling or tenderness. Normal range of motion.     Cervical back: Normal range of motion. No rigidity or tenderness.     Right lower leg: No edema.     Left lower leg: No edema.     Comments: Unsteady gait   Lymphadenopathy:     Cervical: No cervical adenopathy.  Skin:    General: Skin is warm and dry.     Coloration: Skin is not pale.     Findings: No bruising, erythema, lesion or rash.  Neurological:     Mental Status: He is alert and oriented to person, place, and time.     Cranial Nerves: No cranial nerve deficit.     Sensory: No sensory deficit.     Motor: Weakness present.      Coordination:  Coordination normal.     Gait: Gait abnormal.     Comments: Left hand contracture  Psychiatric:        Mood and Affect: Mood normal.        Speech: Speech normal.        Behavior: Behavior normal.        Thought Content: Thought content normal.        Judgment: Judgment normal.     Labs reviewed: Recent Labs    12/15/21 0820  NA 143  K 4.5  CL 107  CO2 28  GLUCOSE 105*  BUN 14  CREATININE 1.14  CALCIUM 9.7   Recent Labs    12/15/21 0820  AST 18  ALT 18  BILITOT 0.5  PROT 7.5   Recent Labs    12/15/21 0820  WBC 5.2  NEUTROABS 2,590  HGB 14.1  HCT 43.3  MCV 88.0  PLT 137*   Lab Results  Component Value Date   TSH 2.67 12/15/2021   Lab Results  Component Value Date   HGBA1C 6.4 (H) 12/15/2021   Lab Results  Component Value Date   CHOL 128 12/15/2021   HDL 44 12/15/2021   LDLCALC 63 12/15/2021   TRIG 120 12/15/2021   CHOLHDL 2.9 12/15/2021    Significant Diagnostic Results in last 30 days:  No results found.  Assessment/Plan Problem List Items Addressed This Visit       Cardiovascular and Mediastinum   Essential hypertension - Primary    Blood pressure elevated this visit though has not taken medication due to fasting for blood work.  Will take medication after visit. -Continue on hydralazine, amlodipine and clonidine -Continue on atorvastatin and aspirin for cardiovascular event prevention -Dietary modification and exercise advised        Digestive   Periodontitis    Right lower premolar lung tooth decay with surrounding erythema on the gums. Right some mandibular gland enlarged and tender to palpation. Will start on amoxicillin as below. -Daughter will book an appointment with a dentist for evaluation of decayed tooth.      Relevant Medications   amoxicillin (AMOXIL) 500 MG capsule   Gastroesophageal reflux disease without esophagitis    Recommended changing Protonix to as needed but states Protonix has been effective  controlling symptoms.  Would like to continue on a daily basis. H/H stable.No tarry or black stool  - advised to avoid eating meals late in the evening and to avoid aggravating foods and spices. - continue on Protonix          Endocrine   Dyslipidemia associated with type 2 diabetes mellitus ([place])    Lab Results  Component Value Date   HGBA1C 6.4 (H) 12/15/2021  No home CBG for review -Diet control -Continue with dietary modification and exercise        Nervous and Auditory   Hemiparesis affecting left side as late effect of stroke (HCC)    Left hand contracture With abnormal gait stability -[employer] paperwork filled and completed during visit patient unable to walk greater than 3 blocks due to gait instability due to high risk for fall post stroke with left-sided weakness.        Other   Morbid obesity ([place])    BMI 37.21 with associated comorbidities type 2 diabetes, dyslipidemia and hypertension      Current moderate episode of major depressive disorder (HCC)    Mood stable -Continue on fluoxetine 10 mg daily -Continue to monitor for mood changes  Family/ staff Communication: Reviewed plan of care with patient and daughter verbalized understanding  Labs/tests ordered: has lab orders in place   Next Appointment : Return in about 6 months (around 12/18/2022) for medical mangement of chronic issues.Sandrea Hughs, NP

## 2022-06-17 NOTE — Assessment & Plan Note (Signed)
Lab Results  Component Value Date   HGBA1C 6.4 (H) 12/15/2021   No home CBG for review -Diet control -Continue with dietary modification and exercise

## 2022-06-17 NOTE — Assessment & Plan Note (Signed)
Blood pressure elevated this visit though has not taken medication due to fasting for blood work.  Will take medication after visit. -Continue on hydralazine, amlodipine and clonidine -Continue on atorvastatin and aspirin for cardiovascular event prevention -Dietary modification and exercise advised

## 2022-06-17 NOTE — Assessment & Plan Note (Signed)
Recommended changing Protonix to as needed but states Protonix has been effective controlling symptoms.  Would like to continue on a daily basis. H/H stable.No tarry or black stool  - advised to avoid eating meals late in the evening and to avoid aggravating foods and spices. - continue on Protonix

## 2022-06-17 NOTE — Assessment & Plan Note (Signed)
Left hand contracture With abnormal gait stability -[employer] paperwork filled and completed during visit patient unable to walk greater than 3 blocks due to gait instability due to high risk for fall post stroke with left-sided weakness.

## 2022-06-17 NOTE — Assessment & Plan Note (Signed)
Right lower premolar lung tooth decay with surrounding erythema on the gums. Right some mandibular gland enlarged and tender to palpation. Will start on amoxicillin as below. -Daughter will book an appointment with a dentist for evaluation of decayed tooth.

## 2022-06-17 NOTE — Assessment & Plan Note (Signed)
Mood stable -Continue on fluoxetine 10 mg daily -Continue to monitor for mood changes

## 2022-06-21 ENCOUNTER — Other Ambulatory Visit: Payer: Medicare (Managed Care)

## 2022-06-21 DIAGNOSIS — E785 Hyperlipidemia, unspecified: Secondary | ICD-10-CM | POA: Diagnosis not present

## 2022-06-21 DIAGNOSIS — F321 Major depressive disorder, single episode, moderate: Secondary | ICD-10-CM | POA: Diagnosis not present

## 2022-06-21 DIAGNOSIS — I10 Essential (primary) hypertension: Secondary | ICD-10-CM | POA: Diagnosis not present

## 2022-06-21 DIAGNOSIS — E119 Type 2 diabetes mellitus without complications: Secondary | ICD-10-CM | POA: Diagnosis not present

## 2022-06-21 DIAGNOSIS — E1169 Type 2 diabetes mellitus with other specified complication: Secondary | ICD-10-CM | POA: Diagnosis not present

## 2022-06-22 LAB — TSH: TSH: 2.08 mIU/L (ref 0.40–4.50)

## 2022-06-22 LAB — LIPID PANEL
Cholesterol: 159 mg/dL (ref <200)
HDL: 54 mg/dL (ref >=40)
LDL Cholesterol (Calc): 78 mg/dL (calc)
Non-HDL Cholesterol (Calc): 105 mg/dL (calc) (ref <130)
Total CHOL/HDL Ratio: 2.9 (calc) (ref <5.0)
Triglycerides: 175 mg/dL — ABNORMAL HIGH (ref <150)

## 2022-06-22 LAB — COMPLETE METABOLIC PANEL WITH GFR
AG Ratio: 1.4 (calc) (ref 1.0–2.5)
ALT: 19 U/L (ref 9–46)
AST: 16 U/L (ref 10–35)
Albumin: 4.6 g/dL (ref 3.6–5.1)
Alkaline phosphatase (APISO): 88 U/L (ref 35–144)
BUN: 16 mg/dL (ref 7–25)
CO2: 25 mmol/L (ref 20–32)
Calcium: 9.9 mg/dL (ref 8.6–10.3)
Chloride: 103 mmol/L (ref 98–110)
Creat: 1.06 mg/dL (ref 0.70–1.35)
Globulin: 3.3 g/dL (calc) (ref 1.9–3.7)
Glucose, Bld: 115 mg/dL — ABNORMAL HIGH (ref 65–99)
Potassium: 4 mmol/L (ref 3.5–5.3)
Sodium: 142 mmol/L (ref 135–146)
Total Bilirubin: 0.5 mg/dL (ref 0.2–1.2)
Total Protein: 7.9 g/dL (ref 6.1–8.1)
eGFR: 79 mL/min/{1.73_m2} (ref >=60)

## 2022-06-22 LAB — CBC WITH DIFFERENTIAL/PLATELET
Absolute Monocytes: 485 cells/uL (ref 200–950)
Basophils Absolute: 30 cells/uL (ref 0–200)
Basophils Relative: 0.6 %
Eosinophils Absolute: 150 cells/uL (ref 15–500)
Eosinophils Relative: 3 %
HCT: 43.3 % (ref 38.5–50.0)
Hemoglobin: 14.3 g/dL (ref 13.2–17.1)
Lymphs Abs: 2125 cells/uL (ref 850–3900)
MCH: 29.7 pg (ref 27.0–33.0)
MCHC: 33 g/dL (ref 32.0–36.0)
MCV: 89.8 fL (ref 80.0–100.0)
MPV: 13.2 fL — ABNORMAL HIGH (ref 7.5–12.5)
Monocytes Relative: 9.7 %
Neutro Abs: 2210 cells/uL (ref 1500–7800)
Neutrophils Relative %: 44.2 %
Platelets: 136 10*3/uL — ABNORMAL LOW (ref 140–400)
RBC: 4.82 10*6/uL (ref 4.20–5.80)
RDW: 14.3 % (ref 11.0–15.0)
Total Lymphocyte: 42.5 %
WBC: 5 10*3/uL (ref 3.8–10.8)

## 2022-06-22 LAB — HEMOGLOBIN A1C
Hgb A1c MFr Bld: 6.8 % of total Hgb — ABNORMAL HIGH (ref <5.7)
Mean Plasma Glucose: 148 mg/dL
eAG (mmol/L): 8.2 mmol/L

## 2022-06-29 ENCOUNTER — Telehealth: Payer: Self-pay | Admitting: *Deleted

## 2022-06-29 ENCOUNTER — Telehealth: Payer: Self-pay | Admitting: Family

## 2022-06-29 NOTE — Telephone Encounter (Signed)
[employer] paper work refilled please fax as request or POA to pickup.

## 2022-06-29 NOTE — Telephone Encounter (Signed)
Admin staff received fax from [employer] through [place] and printed and placed on my desk.  Placed paperwork in Dinah's folder to review, complete and sign.   To call patient once completed because Section A is not completed by patient.

## 2022-06-30 NOTE — Telephone Encounter (Signed)
Patient notified and agreed. Will pick up.  Copy sent to scanning.  Paperwork left up front for patient to pick up.

## 2022-08-16 ENCOUNTER — Other Ambulatory Visit: Payer: Self-pay | Admitting: Family

## 2022-08-16 DIAGNOSIS — I10 Essential (primary) hypertension: Secondary | ICD-10-CM

## 2022-09-21 ENCOUNTER — Telehealth: Payer: Self-pay

## 2022-09-21 NOTE — Telephone Encounter (Signed)
Paperwork was left on the clinical intake desk. [name], medical assistant covering clinical intake today asked if I knew anything about the forms or what action was required.  What prompted [name] to ask the above questions  1.) Paperwork was signed by [name] Sax, [name] [name] on 06/29/22.  3.) Second part of form was signed and dated by patients sister [name] [name] on 08/29/2022 3.) Form appears to be mailed to our office with a post mark date of 09/10/2022.  I sent a staff message to [name] [name] to seek insight as she was working in clinical intake yesterday. Per [name] [name] gave her the forms at closing yesterday and she is under the impression that patient needs to provide another form for [name] [name] to sign since it's been several months since the original signature.  I then placed a call to the patient to get the ultimate clarification on why forms were sent to our office despite [place] completing in August 2023. [name] [name] stated he would like for [place] to fax forms. I reflected back to patient to confirm his request that I am faxing forms to [phone] (which is listed on 2 of 3 part B ( side note there is 2 part B's and the number was listed within the 1st part B information.  Form was faxed and sent to scanning.

## 2022-10-11 ENCOUNTER — Telehealth: Payer: Self-pay

## 2022-10-11 NOTE — Telephone Encounter (Signed)
[place], Nelda Bucks, NP gave me a 15 page form (including a cover page) and verbalized that she does not understand what is needed to further process. Dinah concluded that page 6 that is missing may be for the patient to complete.  A message was on the cover page and it stated: Part A- page 6 is missing. Please complete for review of eligibility.   I also reviewed paperwork and was unable to determine what is needed,as page six (6) is missing and we would need that page faxed to [place] for completion.  I left a detailed message on the voicemail for the [facility] representative Loma Sousa, requesting a return call to further discuss what is needed or that page 6 be faxed to [place], as the fax has 15 pages and neither page is labeled page 6.   Awaiting return call or fax.

## 2022-10-28 ENCOUNTER — Emergency Department (HOSPITAL_COMMUNITY): Payer: Medicare (Managed Care)

## 2022-10-28 ENCOUNTER — Encounter (HOSPITAL_COMMUNITY): Payer: Self-pay

## 2022-10-28 ENCOUNTER — Emergency Department (HOSPITAL_COMMUNITY)
Admission: EM | Admit: 2022-10-28 | Discharge: 2022-10-28 | Disposition: A | Discharge status: Home / Self Care | Payer: Medicare (Managed Care) | Attending: Emergency Medicine | Admitting: Emergency Medicine

## 2022-10-28 ENCOUNTER — Other Ambulatory Visit: Payer: Self-pay

## 2022-10-28 DIAGNOSIS — R42 Dizziness and giddiness: Secondary | ICD-10-CM | POA: Insufficient documentation

## 2022-10-28 DIAGNOSIS — E119 Type 2 diabetes mellitus without complications: Secondary | ICD-10-CM | POA: Insufficient documentation

## 2022-10-28 DIAGNOSIS — F0390 Unspecified dementia without behavioral disturbance: Secondary | ICD-10-CM | POA: Diagnosis not present

## 2022-10-28 DIAGNOSIS — J9811 Atelectasis: Secondary | ICD-10-CM | POA: Diagnosis not present

## 2022-10-28 DIAGNOSIS — I10 Essential (primary) hypertension: Secondary | ICD-10-CM | POA: Insufficient documentation

## 2022-10-28 DIAGNOSIS — Z87891 Personal history of nicotine dependence: Secondary | ICD-10-CM | POA: Diagnosis not present

## 2022-10-28 DIAGNOSIS — Z8673 Personal history of transient ischemic attack (TIA), and cerebral infarction without residual deficits: Secondary | ICD-10-CM | POA: Insufficient documentation

## 2022-10-28 DIAGNOSIS — G9389 Other specified disorders of brain: Secondary | ICD-10-CM | POA: Diagnosis not present

## 2022-10-28 DIAGNOSIS — R55 Syncope and collapse: Secondary | ICD-10-CM | POA: Diagnosis not present

## 2022-10-28 DIAGNOSIS — R079 Chest pain, unspecified: Secondary | ICD-10-CM | POA: Diagnosis not present

## 2022-10-28 DIAGNOSIS — H81399 Other peripheral vertigo, unspecified ear: Secondary | ICD-10-CM

## 2022-10-28 DIAGNOSIS — D696 Thrombocytopenia, unspecified: Secondary | ICD-10-CM | POA: Insufficient documentation

## 2022-10-28 DIAGNOSIS — R0602 Shortness of breath: Secondary | ICD-10-CM | POA: Diagnosis not present

## 2022-10-28 DIAGNOSIS — D72819 Decreased white blood cell count, unspecified: Secondary | ICD-10-CM | POA: Insufficient documentation

## 2022-10-28 DIAGNOSIS — Z79899 Other long term (current) drug therapy: Secondary | ICD-10-CM | POA: Diagnosis not present

## 2022-10-28 DIAGNOSIS — R4781 Slurred speech: Secondary | ICD-10-CM | POA: Diagnosis not present

## 2022-10-28 DIAGNOSIS — Z7982 Long term (current) use of aspirin: Secondary | ICD-10-CM | POA: Diagnosis not present

## 2022-10-28 LAB — CBC WITH DIFFERENTIAL/PLATELET
Abs Immature Granulocytes: 0 10*3/uL (ref 0.00–0.07)
Basophils Absolute: 0 10*3/uL (ref 0.0–0.1)
Basophils Relative: 0 %
Eosinophils Absolute: 0 10*3/uL (ref 0.0–0.5)
Eosinophils Relative: 1 %
HCT: 45.2 % (ref 39.0–52.0)
Hemoglobin: 14.6 g/dL (ref 13.0–17.0)
Immature Granulocytes: 0 %
Lymphocytes Relative: 21 %
Lymphs Abs: 0.5 10*3/uL — ABNORMAL LOW (ref 0.7–4.0)
MCH: 28.7 pg (ref 26.0–34.0)
MCHC: 32.3 g/dL (ref 30.0–36.0)
MCV: 89 fL (ref 80.0–100.0)
Monocytes Absolute: 0.1 10*3/uL (ref 0.1–1.0)
Monocytes Relative: 5 %
Neutro Abs: 1.8 10*3/uL (ref 1.7–7.7)
Neutrophils Relative %: 73 %
Platelets: 97 10*3/uL — ABNORMAL LOW (ref 150–400)
RBC: 5.08 MIL/uL (ref 4.22–5.81)
RDW: 14.6 % (ref 11.5–15.5)
WBC: 2.4 10*3/uL — ABNORMAL LOW (ref 4.0–10.5)
nRBC: 0 % (ref 0.0–0.2)

## 2022-10-28 LAB — BASIC METABOLIC PANEL
Anion gap: 10 (ref 5–15)
BUN: 20 mg/dL (ref 8–23)
CO2: 24 mmol/L (ref 22–32)
Calcium: 10.1 mg/dL (ref 8.9–10.3)
Chloride: 108 mmol/L (ref 98–111)
Creatinine, Ser: 1.06 mg/dL (ref 0.61–1.24)
GFR, Estimated: >60 mL/min (ref >60)
Glucose, Bld: 293 mg/dL — ABNORMAL HIGH (ref 70–99)
Potassium: 4.5 mmol/L (ref 3.5–5.1)
Sodium: 142 mmol/L (ref 135–145)

## 2022-10-28 LAB — TROPONIN I (HIGH SENSITIVITY)
Troponin I (High Sensitivity): 21 ng/L — ABNORMAL HIGH (ref <18)
Troponin I (High Sensitivity): 22 ng/L — ABNORMAL HIGH (ref <18)

## 2022-10-28 MED ORDER — MECLIZINE HCL 25 MG PO TABS: 25.0000 mg | ORAL_TABLET | Freq: Three times a day (TID) | ORAL | 0 refills | Status: DC | PRN

## 2022-10-28 MED ORDER — MECLIZINE HCL 25 MG PO TABS
25.0000 mg | ORAL_TABLET | Freq: Once | ORAL | Status: AC
  Administered 2022-10-28: 25 mg via ORAL
  Filled 2022-10-28: qty 1

## 2022-10-28 NOTE — Discharge Instructions (Addendum)
We evaluated you for your dizziness.  Your MRI was negative for any strokes.  Your symptoms improved with vertigo medication.  I have prescribed you with this medication to take at home..  You can take it 3 times a day as needed for dizziness.  Be very careful with walking now that you are having dizziness.  Be sure to use your cane at all times.  Please follow-up closely with your primary doctor for further management of your symptoms.  Please return to the emergency department if you develop any new weakness, tingling, fainting, vision changes, trouble walking, headaches, or any other concerning symptoms.  We noticed that your white blood cell count and platelet count were low.  I do not think this is related to your dizziness.  Please make sure your primary care doctor is aware of this as they will need to repeat your laboratory tests.

## 2022-10-28 NOTE — ED Notes (Signed)
Patient ambulated well with cane and minimal assistance in hallway. No complaints of SOB, dizziness, or light-headedness. MD is aware and RN notified.

## 2022-10-28 NOTE — ED Triage Notes (Signed)
PT BIB [facility] from home c/o dizziness that started yesterday morning after waking up. Pt has a hx of strokes with left sided deficits.

## 2022-10-28 NOTE — ED Provider Triage Note (Signed)
Emergency Medicine Provider Triage Evaluation Note  [name] [name] , a 61 y.o. male  was evaluated in triage.  Pt complains of dizziness.  States he woke up yesterday with dizziness, worse with shaking head and movement.  No hx of vertigo but does have hx of prior CVA with residual left sided deficits.  Review of Systems  Positive: dizziness Negative: fever  Physical Exam  BP 123/81 (BP Location: Right Arm)   Pulse 64   Temp 97.6 F (36.4 C)   Resp 16   SpO2 97%   Gen:   Awake, no distress   Resp:  Normal effort  MSK:   Moves extremities without difficulty  Other:  AAOx3, LUE contracture from prior stroke  Medical Decision Making  Medically screening exam initiated at 3:27 AM.  Appropriate orders placed.  [name] [name]. was informed that the remainder of the evaluation will be completed by another provider, this initial triage assessment does not replace that evaluation, and the importance of remaining in the ED until their evaluation is complete.  Dizziness upon waking yesterday, described as room spinning.  Prior CVA with residual left sided deficits.  Also reports some chest pain.  EKG, labs, CXR, CT head.   Larene Pickett, PA-C 10/28/22 [phone]

## 2022-10-28 NOTE — ED Provider Notes (Signed)
[facility] EMERGENCY DEPARTMENT Provider Note  CSN: [id number] Arrival date & time: 10/28/22 7672  Chief Complaint(s) Dizziness  HPI [name] [name]. is a 61 y.o. male with a history of hypertension, hyperlipidemia, prior right sided CVA with left hemiparesis presenting with dizziness.  He describes the dizziness as a spinning sensation. the patient reports that he developed dizziness yesterday, worse when standing up.  He reports that when he is at rest he does not have any dizziness.  He reports some changes in walking and feels that he is falling over to 1 side.  No nausea or vomiting.  No fevers or chills.  No numbness, tingling, new weakness.  No chest pain or shortness of breath.  No headaches.  Symptoms mild.  Symptoms have been present since yesterday morning.   Past Medical History Past Medical History:  Diagnosis Date   Acute respiratory failure ([place])    Cerebral edema (HCC)    Diabetes mellitus without complication ([place])    diet controlled- no meds   Dysphagia as late effect of cerebrovascular disease    Fall at home    Hemiparesis affecting left side as late effect of stroke ([place])    Hyperlipidemia    Hypertension    Stroke ([place]) 2016   Tuberculosis    11 yrs ago per pt.- treated with meds per pt.   Patient Active Problem List   Diagnosis Date Noted   Periodontitis 06/17/2022   BMI 29.0-29.9,adult 06/09/2021   Current moderate episode of major depressive disorder ([place]) 06/09/2021   Onychomycosis 08/08/2020   Morbid obesity ([place]) 01/23/2020   Gastroesophageal reflux disease without esophagitis 08/28/2019   Tobacco abuse 01/11/2018   Adenomatous polyp of colon 01/11/2018   Spastic hemiplegia affecting nondominant side ([place]) 01/11/2018   Vascular dementia without behavioral disturbance ([place]) 01/11/2018   H pylori ulcer 10/19/2015   Dyslipidemia associated with type 2 diabetes mellitus ([place]) 10/19/2015   Dementia with behavioral disturbance  ([place])    Cognitive impairment    Fall 09/07/2015   Hypokalemia    H/O: stroke with residual effects    Presence of stent in artery 07/28/2015   Prediabetes 07/28/2015   Acute CVA (cerebrovascular accident) ([place]) 06/02/2015   Hemiparesis affecting left side as late effect of stroke ([place]) 06/02/2015   Dyslipidemia 06/02/2015   Type II diabetes mellitus with neurological manifestations ([place]) 06/02/2015   Essential hypertension 06/02/2015   Chronic constipation 06/02/2015   Depression due to stroke 06/02/2015   Dysphagia as late effect of stroke 06/02/2015   Elevated cholesterol with high triglycerides 05/14/2015   QT prolongation 05/08/2015   Hx of ischemic right MCA stroke 05/08/2015   Home Medication(s) Prior to Admission medications   Medication Sig Start Date End Date Taking? Authorizing Provider  meclizine (ANTIVERT) 25 MG tablet Take 1 tablet (25 mg total) by mouth 3 (three) times daily as needed for dizziness. 10/28/22  Yes Cristie Hem, MD  Acetaminophen (TYLENOL PO) Take 1 tablet by mouth as needed (Pain).    [provider]  amLODipine (NORVASC) 10 MG tablet Take 1 tablet (10 mg total) by mouth daily. I10 11/23/21   Ngetich, Dinah C, NP  aspirin 325 MG tablet Take 1 tablet (325 mg total) by mouth daily. 08/14/15   Mercy Riding, MD  atorvastatin (LIPITOR) 80 MG tablet TAKE 0.5 TABLETS BY MOUTH DAILY. 11/23/21   Ngetich, Dinah C, NP  cloNIDine (CATAPRES) 0.1 MG tablet TAKE 1 TABLET (0.1 MG TOTAL) BY MOUTH  EVERY 8 (EIGHT) HOURS AS NEEDED. 09/09/21   Ngetich, Dinah C, NP  Doxylamine Succinate, Sleep, (SLEEP-AID PO) Take by mouth.    [provider]  FLUoxetine (PROZAC) 10 MG capsule TAKE 1 CAPSULE BY MOUTH EVERY DAY 03/04/22   Ngetich, Dinah C, NP  hydrALAZINE (APRESOLINE) 25 MG tablet TAKE 1 TABLET BY MOUTH THREE TIMES A DAY 08/16/22   Ngetich, Dinah C, NP  ibuprofen (ADVIL) 800 MG tablet Take 800 mg by mouth every 6 (six) hours as needed. 12/07/21   [provider]  OMEGA-3 FATTY ACIDS PO Take 2 Gm capsule  by mouth twice daily    [provider]  pantoprazole (PROTONIX) 40 MG tablet TAKE 1 TABLET BY MOUTH TWICE A DAY 12/07/21   Ngetich, Nelda Bucks, NP                                                                                                                                    Past Surgical History Past Surgical History:  Procedure Laterality Date   COLONOSCOPY  10/06/2016   ESOPHAGOGASTRODUODENOSCOPY N/A 08/12/2015   Procedure: ESOPHAGOGASTRODUODENOSCOPY (EGD);  Surgeon: Ladene Artist, MD;  Location: [facility] ENDOSCOPY;  Service: Endoscopy;  Laterality: N/A;   feeding tube removed     GASTROSTOMY TUBE PLACEMENT  05-19-15   mechanical thrombectomy angioplasty stenet placement     SKIN GRAFT  1980's   ankle   UPPER GASTROINTESTINAL ENDOSCOPY     Family History Family History  Problem Relation Age of Onset   Alcohol abuse Sister    Alcohol abuse Brother    Alcohol abuse Father    Colon cancer Neg Hx    Colon polyps Neg Hx    Rectal cancer Neg Hx    Stomach cancer Neg Hx    Esophageal cancer Neg Hx     Social History Social History   Tobacco Use   Smoking status: Former    Packs/day: 0.50    Types: Cigarettes    Quit date: 02/07/2020    Years since quitting: 2.7   Smokeless tobacco: Never  Vaping Use   Vaping Use: Never used  Substance Use Topics   Alcohol use: No    Alcohol/week: 0.0 standard drinks of alcohol   Drug use: No   Allergies Patient has no known allergies.  Review of Systems Review of Systems  All other systems reviewed and are negative.   Physical Exam Vital Signs  I have reviewed the triage vital signs BP 127/85   Pulse 78   Temp 97.6 F (36.4 C) (Oral)   Resp 18   Ht '6\' 2"'$  (1.88 m)   Wt 121.6 kg   SpO2 96%   BMI 34.41 kg/m  Physical Exam Vitals and nursing note reviewed.  Constitutional:      General: He is not in acute distress.    Appearance: Normal appearance.  HENT:      Mouth/Throat:  Mouth: Mucous membranes are moist.  Eyes:     Conjunctiva/sclera: Conjunctivae normal.  Cardiovascular:     Rate and Rhythm: Normal rate and regular rhythm.  Pulmonary:     Effort: Pulmonary effort is normal. No respiratory distress.     Breath sounds: Normal breath sounds.  Abdominal:     General: Abdomen is flat.     Palpations: Abdomen is soft.     Tenderness: There is no abdominal tenderness.  Musculoskeletal:     Right lower leg: No edema.     Left lower leg: No edema.  Skin:    General: Skin is warm and dry.     Capillary Refill: Capillary refill takes less than 2 seconds.  Neurological:     Mental Status: He is alert and oriented to person, place, and time. Mental status is at baseline.     Comments: Cranial nerves II through XII intact with the exception of slight left-sided chronic facial droop, slight chronic slurred speech, left upper extremity hand contracture but 5 out of 5 strength with flexion/extension at the elbow, 5 out of 5 strength in the right upper extremity, 5 out of 5 strength in the bilateral lower extremities.  No dysmetria on finger-nose-finger testing on the right, on the left, limited due to contracture but patient able to perform hand-nose-hand using his contracted fist without dysmetria.  Psychiatric:        Mood and Affect: Mood normal.        Behavior: Behavior normal.     ED Results and Treatments Labs (all labs ordered are listed, but only abnormal results are displayed) Labs Reviewed  CBC WITH DIFFERENTIAL/PLATELET - Abnormal; Notable for the following components:      Result Value   WBC 2.4 (*)    Platelets 97 (*)    Lymphs Abs 0.5 (*)    All other components within normal limits  BASIC METABOLIC PANEL - Abnormal; Notable for the following components:   Glucose, Bld 293 (*)    All other components within normal limits  TROPONIN I (HIGH SENSITIVITY) - Abnormal; Notable for the following components:   Troponin I (High  Sensitivity) 21 (*)    All other components within normal limits  TROPONIN I (HIGH SENSITIVITY) - Abnormal; Notable for the following components:   Troponin I (High Sensitivity) 22 (*)    All other components within normal limits                                                                                                                          Radiology MR BRAIN WO CONTRAST  Result Date: 10/28/2022 CLINICAL DATA:  Neuro deficit, acute, stroke suspected EXAM: MRI HEAD WITHOUT CONTRAST TECHNIQUE: Multiplanar, multiecho pulse sequences of the brain and surrounding structures were obtained without intravenous contrast. COMPARISON:  CT head 10/28/2022. FINDINGS: Brain: Extensive encephalomalacia throughout the right cerebral hemisphere with surrounding gliosis and volume loss. No evidence of acute infarct, acute hemorrhage, mass lesion, midline shift,  or hydrocephalus. Evidence of prior hemorrhage in the right cerebral hemisphere. Vascular: None major arterial flow voids are maintained at the skull base. Skull and upper cervical spine: Normal marrow signal. Sinuses/Orbits: Largely clear sinuses.  No acute orbital findings. Other: No mastoid effusions. IMPRESSION: 1. No evidence of acute intracranial abnormality. 2. Extensive right cerebral hemisphere encephalomalacia/gliosis. Electronically Signed   By: Margaretha Sheffield M.D.   On: 10/28/2022 12:07   CT HEAD WO CONTRAST (5MM)  Result Date: 10/28/2022 CLINICAL DATA:  Syncope/presyncope with cerebrovascular cause suspected EXAM: CT HEAD WITHOUT CONTRAST TECHNIQUE: Contiguous axial images were obtained from the base of the skull through the vertex without intravenous contrast. RADIATION DOSE REDUCTION: This exam was performed according to the departmental dose-optimization program which includes automated exposure control, adjustment of the mA and/or kV according to patient size and/or use of iterative reconstruction technique. COMPARISON:  09/07/2015  FINDINGS: Brain: No evidence of acute infarction, hemorrhage, hydrocephalus, extra-axial collection or mass lesion/mass effect. Large remote right MCA territory infarct with dense encephalomalacia. No acute hemorrhage, hydrocephalus, or collection. Vascular: Right M1 stenting. Skull: No acute or aggressive finding Sinuses/Orbits: No acute finding. IMPRESSION: 1. No acute finding. 2. Remote right MCA infarct. Electronically Signed   By: Jorje Guild M.D.   On: 10/28/2022 04:23   DG Chest 2 View  Result Date: 10/28/2022 CLINICAL DATA:  Chest pain, dizziness, and shortness of breath. EXAM: CHEST - 2 VIEW COMPARISON:  09/07/2015. FINDINGS: The heart size and mediastinal contours are within normal limits. Lung volumes are low. Mild atelectasis is noted at the right lung base. No consolidation, effusion, or pneumothorax. Degenerative changes are noted in the thoracic spine. IMPRESSION: Mild atelectasis at the right lung base. Electronically Signed   By: Brett Fairy M.D.   On: 10/28/2022 04:12    Pertinent labs & imaging results that were available during my care of the patient were reviewed by me and considered in my medical decision making (see MDM for details).  Medications Ordered in ED Medications  meclizine (ANTIVERT) tablet 25 mg (25 mg Oral Given 10/28/22 1420)                                                                                                                                     Procedures Procedures  (including critical care time)  Medical Decision Making / ED Course   MDM:  61 year old male presenting with dizziness.  Given worse with standing, movement, and neurologic exam seemingly at baseline, higher concern for BPPV, however patient is very high risk for stroke given his comorbidities.  Obtain MRI.  If MRI negative, will attempt ambulation trial, likely treat with meclizine.  Patient not describing his symptoms as a lightheadedness, he has reassuring blood pressure.   Troponin mildly elevated but patient denies any chest pain and troponin is stable.  No infectious symptoms to suggest cause of low blood pressure or lightheadedness.  Patient overall well-appearing  will repeat EKG as initial EKG very low quality with significant artifact.  Clinical Course as of 10/28/22 1556  Thu Oct 28, 2022  1211 MRI negative for acute stroke.  Will attempt ambulation.  EKG reassuring. [WS]  1355 Patient dizzy on standing. Will trial meclizine.  [WS]  6387 Patient improved with meclizine and was able to ambulate with a cane.  Will prescribe meclizine.  He does live alone but family members are available to help.  His family will help him go to his primary doctor appointment on 12/8 to help set up additional home health, and in the interim there are family members around she will help out at home. Will discharge patient to home. All questions answered. Patient comfortable with plan of discharge. Return precautions discussed with patient and specified on the after visit summary.  [WS]    Clinical Course User Index [WS] Cristie Hem, MD     Additional history obtained: -Additional history obtained from family and ems -External records from outside source obtained and reviewed including: Chart review including previous notes, labs, imaging, consultation notes including ED visit 12/17/21   Lab Tests: -I ordered, reviewed, and interpreted labs.   The pertinent results include:   Labs Reviewed  CBC WITH DIFFERENTIAL/PLATELET - Abnormal; Notable for the following components:      Result Value   WBC 2.4 (*)    Platelets 97 (*)    Lymphs Abs 0.5 (*)    All other components within normal limits  BASIC METABOLIC PANEL - Abnormal; Notable for the following components:   Glucose, Bld 293 (*)    All other components within normal limits  TROPONIN I (HIGH SENSITIVITY) - Abnormal; Notable for the following components:   Troponin I (High Sensitivity) 21 (*)    All other  components within normal limits  TROPONIN I (HIGH SENSITIVITY) - Abnormal; Notable for the following components:   Troponin I (High Sensitivity) 22 (*)    All other components within normal limits    Notable for mild leukopenia and thrombocytopenia of unclear cause, related to patient and added to discharge instructions as patient will need outpatient follow-up.  Mild hyperglycemia.  Stably elevated troponin of unclear significance but doubt ACS.  EKG   EKG Interpretation  Date/Time:  Thursday October 28 2022 12:37:34 EST Ventricular Rate:  67 PR Interval:  160 QRS Duration: 90 QT Interval:  442 QTC Calculation: 467 R Axis:   -16 Text Interpretation: Normal sinus rhythm Minimal voltage criteria for LVH, may be normal variant ( R in aVL ) Nonspecific T wave abnormality Abnormal ECG No significant change since last tracing Confirmed by Garnette Gunner [phone]) on 10/28/2022 12:45:35 PM         Imaging Studies ordered: I ordered imaging studies including MRI brain On my interpretation imaging demonstrates no acute stroke I independently visualized and interpreted imaging. I agree with the radiologist interpretation   Medicines ordered and prescription drug management: Meds ordered this encounter  Medications   meclizine (ANTIVERT) tablet 25 mg   meclizine (ANTIVERT) 25 MG tablet    Sig: Take 1 tablet (25 mg total) by mouth 3 (three) times daily as needed for dizziness.    Dispense:  30 tablet    Refill:  0    -I have reviewed the patients home medicines and have made adjustments as needed   Social Determinants of Health:  Diagnosis or treatment significantly limited by social determinants of health: former smoker, obesity, and lives alone   Reevaluation:  After the interventions noted above, I reevaluated the patient and found that his symptoms have resolved after meclizine.  Co morbidities that complicate the patient evaluation  Past Medical History:  Diagnosis  Date   Acute respiratory failure ([place])    Cerebral edema (HCC)    Diabetes mellitus without complication (HCC)    diet controlled- no meds   Dysphagia as late effect of cerebrovascular disease    Fall at home    Hemiparesis affecting left side as late effect of stroke ([place])    Hyperlipidemia    Hypertension    Stroke ([place]) 2016   Tuberculosis    11 yrs ago per pt.- treated with meds per pt.      Dispostion: Disposition decision including need for hospitalization was considered, and patient discharged from emergency department.    Final Clinical Impression(s) / ED Diagnoses Final diagnoses:  Peripheral vertigo, unspecified laterality     This chart was dictated using voice recognition software.  Despite best efforts to proofread,  errors can occur which can change the documentation meaning.    Cristie Hem, MD 10/28/22 1556

## 2022-11-04 ENCOUNTER — Encounter: Payer: Self-pay | Admitting: Family

## 2022-11-04 ENCOUNTER — Ambulatory Visit: Payer: Medicare (Managed Care) | Admitting: Family

## 2022-11-04 ENCOUNTER — Telehealth: Payer: Self-pay | Admitting: *Deleted

## 2022-11-04 ENCOUNTER — Other Ambulatory Visit: Payer: Self-pay | Admitting: Family

## 2022-11-04 VITALS — BP 142/90 | HR 78 | Temp 97.9°F | Resp 18 | Ht 74.0 in | Wt 276.0 lb

## 2022-11-04 DIAGNOSIS — K219 Gastro-esophageal reflux disease without esophagitis: Secondary | ICD-10-CM | POA: Diagnosis not present

## 2022-11-04 DIAGNOSIS — F0150 Vascular dementia without behavioral disturbance: Secondary | ICD-10-CM | POA: Diagnosis not present

## 2022-11-04 DIAGNOSIS — E119 Type 2 diabetes mellitus without complications: Secondary | ICD-10-CM | POA: Diagnosis not present

## 2022-11-04 DIAGNOSIS — M24542 Contracture, left hand: Secondary | ICD-10-CM | POA: Diagnosis not present

## 2022-11-04 DIAGNOSIS — Z0289 Encounter for other administrative examinations: Secondary | ICD-10-CM

## 2022-11-04 DIAGNOSIS — I69354 Hemiplegia and hemiparesis following cerebral infarction affecting left non-dominant side: Secondary | ICD-10-CM

## 2022-11-04 DIAGNOSIS — I10 Essential (primary) hypertension: Secondary | ICD-10-CM

## 2022-11-04 MED ORDER — HYDRALAZINE HCL 25 MG PO TABS: 25.0000 mg | ORAL_TABLET | Freq: Three times a day (TID) | ORAL | 1 refills | Status: DC

## 2022-11-04 MED ORDER — PANTOPRAZOLE SODIUM 40 MG PO TBEC: 40.0000 mg | DELAYED_RELEASE_TABLET | Freq: Two times a day (BID) | ORAL | 1 refills | Status: DC

## 2022-11-04 NOTE — Telephone Encounter (Signed)
Dinah requested a Personal Care Service [facility]) form to be completed on patient.  Completed and placed in Dinah's Folder to review and sign.  To be faxed to [place] [facility] Fax:[phone] once completed.

## 2022-11-04 NOTE — Progress Notes (Signed)
Provider: Marlowe Sax FNP-C  Lucillie Kiesel, Nelda Bucks, NP  Patient Care Team: Decari Duggar, Nelda Bucks, NP as PCP - [facility] ([facility]) [facility], [place] ([place])  Extended Emergency Contact Information Primary Emergency Contact: Hosie,Yavonda Mobile Phone: [phone] Relation: Daughter Secondary Emergency Contact: [name],Nicole Address: [address]          [place], [place] [address] Johnnette Litter of [place] Mobile Phone: [phone] Relation: Daughter  Code Status:  Full Code  Goals of care: Advanced Directive information    11/04/2022    8:45 AM  Advanced Directives  Does Patient Have a Medical Advance Directive? No  Would patient like information on creating a medical advance directive? No - Patient declined     Chief Complaint  Patient presents with   Hospitalization Follow-up    ER Follow up 10/28/2022 for Dizziness.     HPI:  Pt is a 62 y.o. male seen today for an acute visit for follow up ED visit on 10/28/2022 for vertigo.  He had MRI done that was negative for acute stroke.  EKG was also abnormal.  Meclizine was administered with much improvement.  Patient was able to ambulate without symptoms of dizziness.  He was discharged home on meclizine.  He states symptoms have resolved has not had any dizziness since discharge from the hospital.  He with the mother of his children.states will be taking over his care.she request Home health aid to assist with his ADL's. Also states need physical therapy for worsening left hand contracture.  Also request Placard for DME patient unable to walk far due to unsteady gait secondary to left hemiparesis.  He previous had [employer] but states was not renewed so has had transportation issues.Daughter and Friend assist with Transportation to medical visit. He will call [employer] transportation to obtain forms then will complete.   Past Medical History:  Diagnosis Date   Acute respiratory failure (HCC)     Cerebral edema (HCC)    Diabetes mellitus without complication ([place])    diet controlled- no meds   Dysphagia as late effect of cerebrovascular disease    Fall at home    Hemiparesis affecting left side as late effect of stroke ([place])    Hyperlipidemia    Hypertension    Stroke ([place]) 2016   Tuberculosis    11 yrs ago per pt.- treated with meds per pt.   Past Surgical History:  Procedure Laterality Date   COLONOSCOPY  10/06/2016   ESOPHAGOGASTRODUODENOSCOPY N/A 08/12/2015   Procedure: ESOPHAGOGASTRODUODENOSCOPY (EGD);  Surgeon: Ladene Artist, MD;  Location: [facility] ENDOSCOPY;  Service: Endoscopy;  Laterality: N/A;   feeding tube removed     GASTROSTOMY TUBE PLACEMENT  05-19-15   mechanical thrombectomy angioplasty stenet placement     SKIN GRAFT  1980's   ankle   UPPER GASTROINTESTINAL ENDOSCOPY      No Known Allergies  Outpatient Encounter Medications as of 11/04/2022  Medication Sig   Acetaminophen (TYLENOL PO) Take 1 tablet by mouth as needed (Pain).   amLODipine (NORVASC) 10 MG tablet Take 1 tablet (10 mg total) by mouth daily. I10   aspirin 325 MG tablet Take 1 tablet (325 mg total) by mouth daily.   atorvastatin (LIPITOR) 80 MG tablet TAKE 0.5 TABLETS BY MOUTH DAILY.   cloNIDine (CATAPRES) 0.1 MG tablet TAKE 1 TABLET (0.1 MG TOTAL) BY MOUTH EVERY 8 (EIGHT) HOURS AS NEEDED.   Doxylamine Succinate, Sleep, (SLEEP-AID PO) Take by mouth.   FLUoxetine (PROZAC) 10  MG capsule TAKE 1 CAPSULE BY MOUTH EVERY DAY   hydrALAZINE (APRESOLINE) 25 MG tablet TAKE 1 TABLET BY MOUTH THREE TIMES A DAY   ibuprofen (ADVIL) 800 MG tablet Take 800 mg by mouth every 6 (six) hours as needed.   meclizine (ANTIVERT) 25 MG tablet Take 1 tablet (25 mg total) by mouth 3 (three) times daily as needed for dizziness.   OMEGA-3 FATTY ACIDS PO Take 2 Gm capsule  by mouth twice daily   pantoprazole (PROTONIX) 40 MG tablet TAKE 1 TABLET BY MOUTH TWICE A DAY   No facility-administered encounter medications on file  as of 11/04/2022.    Review of Systems  Constitutional:  Negative for appetite change, chills, fatigue, fever and unexpected weight change.  HENT:  Negative for congestion, ear discharge, ear pain, hearing loss, nosebleeds, postnasal drip, rhinorrhea, sinus pressure, sinus pain, sneezing, sore throat, tinnitus and trouble swallowing.   Eyes:  Negative for pain, discharge, redness, itching and visual disturbance.  Respiratory:  Negative for cough, chest tightness, shortness of breath and wheezing.   Cardiovascular:  Negative for chest pain, palpitations and leg swelling.  Gastrointestinal:  Negative for abdominal distention, abdominal pain, constipation, diarrhea, nausea and vomiting.  Endocrine: Negative for cold intolerance, heat intolerance, polydipsia, polyphagia and polyuria.  Genitourinary:  Negative for difficulty urinating, dysuria, flank pain, frequency and urgency.  Musculoskeletal:  Negative for arthralgias, back pain, gait problem, joint swelling, myalgias, neck pain and neck stiffness.  Skin:  Negative for color change, pallor, rash and wound.  Neurological:  Positive for weakness. Negative for dizziness, syncope, speech difficulty, light-headedness, numbness and headaches.       Left hand weakness  Hematological:  Does not bruise/bleed easily.  Psychiatric/Behavioral:  Negative for agitation, behavioral problems, confusion, hallucinations, self-injury, sleep disturbance and suicidal ideas. The patient is not nervous/anxious.        Memory loss     Immunization History  Administered Date(s) Administered   Fluad Quad(high Dose 65+) 12/17/2021   Influenza,inj,Quad PF,6+ Mos 08/10/2015, 09/22/2016, 07/29/2017, 11/27/2018, 09/28/2019, 12/08/2020   Pneumococcal Conjugate-13 01/16/2019   Pneumococcal Polysaccharide-23 12/10/2016   Td 01/16/2019   Tdap 01/16/2019   Zoster Recombinat (Shingrix) 12/17/2021   Pertinent  Health Maintenance Due  Topic Date Due   OPHTHALMOLOGY EXAM   07/28/2021   FOOT EXAM  08/08/2021   INFLUENZA VACCINE  06/08/2022   HEMOGLOBIN A1C  12/22/2022   COLONOSCOPY (Pts 45-25yr Insurance coverage will need to be confirmed)  03/24/2023      07/17/2021    3:22 PM 12/17/2021   10:05 AM 06/17/2022    9:05 AM 10/28/2022    3:41 AM 11/04/2022    8:45 AM  Fall Risk  Falls in the past year? 0 0 0  0  Was there an injury with Fall? 0 0 0  0  Fall Risk Category Calculator 0 0 0  0  Fall Risk Category Low Low Low  Low  Patient Fall Risk Level Low fall risk Low fall risk Low fall risk Low fall risk Low fall risk  Patient at Risk for Falls Due to No Fall Risks No Fall Risks No Fall Risks  No Fall Risks  Fall risk Follow up Falls evaluation completed Falls evaluation completed Falls evaluation completed  Falls evaluation completed   Functional Status Survey:    Vitals:   11/04/22 0844  BP: (!) 142/90  Pulse: 78  Resp: 18  Temp: 97.9 F (36.6 C)  SpO2: 96%  Weight: 276 lb (125.2  kg)  Height: '6\' 2"'$  (1.88 m)   Body mass index is 35.44 kg/m. Physical Exam Vitals reviewed.  Constitutional:      General: He is not in acute distress.    Appearance: Normal appearance. He is obese. He is not ill-appearing or diaphoretic.  HENT:     Head: Normocephalic.     Right Ear: Tympanic membrane, ear canal and external ear normal. There is no impacted cerumen.     Left Ear: Tympanic membrane, ear canal and external ear normal. There is no impacted cerumen.     Nose: Nose normal. No congestion or rhinorrhea.     Mouth/Throat:     Mouth: Mucous membranes are moist.     Pharynx: Oropharynx is clear. No oropharyngeal exudate or posterior oropharyngeal erythema.  Eyes:     General: No scleral icterus.       Right eye: No discharge.        Left eye: No discharge.     Extraocular Movements: Extraocular movements intact.     Conjunctiva/sclera: Conjunctivae normal.     Pupils: Pupils are equal, round, and reactive to light.  Neck:     Vascular: No carotid  bruit.  Cardiovascular:     Rate and Rhythm: Normal rate and regular rhythm.     Pulses: Normal pulses.     Heart sounds: Normal heart sounds. No murmur heard.    No friction rub. No gallop.  Pulmonary:     Effort: Pulmonary effort is normal. No respiratory distress.     Breath sounds: Normal breath sounds. No wheezing, rhonchi or rales.  Chest:     Chest wall: No tenderness.  Abdominal:     General: Bowel sounds are normal. There is no distension.     Palpations: Abdomen is soft. There is no mass.     Tenderness: There is no abdominal tenderness. There is no right CVA tenderness, left CVA tenderness, guarding or rebound.  Musculoskeletal:        General: No swelling or tenderness.     Right hand: Normal.     Left hand: Deformity present. No swelling or tenderness. Decreased range of motion. Normal capillary refill. Normal pulse.     Cervical back: Normal range of motion. No rigidity or tenderness.     Right lower leg: No edema.     Left lower leg: No edema.     Comments: Left hand contracture   Lymphadenopathy:     Cervical: No cervical adenopathy.  Skin:    General: Skin is warm and dry.     Coloration: Skin is not pale.     Findings: No bruising, erythema, lesion or rash.  Neurological:     Mental Status: He is alert and oriented to person, place, and time. Mental status is at baseline.     Cranial Nerves: No cranial nerve deficit.     Sensory: No sensory deficit.     Motor: No weakness.     Coordination: Coordination normal.     Gait: Gait abnormal.  Psychiatric:        Mood and Affect: Mood normal.        Speech: Speech normal.        Behavior: Behavior normal.        Thought Content: Thought content normal.        Cognition and Memory: Memory is impaired.        Judgment: Judgment normal.     Labs reviewed: Recent Labs    12/15/21  0820 06/21/22 0901 10/28/22 0347  NA 143 142 142  K 4.5 4.0 4.5  CL 107 103 108  CO2 '28 25 24  '$ GLUCOSE 105* 115* 293*  BUN '14  16 20  '$ CREATININE 1.14 1.06 1.06  CALCIUM 9.7 9.9 10.1   Recent Labs    12/15/21 0820 06/21/22 0901  AST 18 16  ALT 18 19  BILITOT 0.5 0.5  PROT 7.5 7.9   Recent Labs    12/15/21 0820 06/21/22 0901 10/28/22 0347  WBC 5.2 5.0 2.4*  NEUTROABS 2,590 2,210 1.8  HGB 14.1 14.3 14.6  HCT 43.3 43.3 45.2  MCV 88.0 89.8 89.0  PLT 137* 136* 97*   Lab Results  Component Value Date   TSH 2.08 06/21/2022   Lab Results  Component Value Date   HGBA1C 6.8 (H) 06/21/2022   Lab Results  Component Value Date   CHOL 159 06/21/2022   HDL 54 06/21/2022   LDLCALC 78 06/21/2022   TRIG 175 (H) 06/21/2022   CHOLHDL 2.9 06/21/2022    Significant Diagnostic Results in last 30 days:  MR BRAIN WO CONTRAST  Result Date: 10/28/2022 CLINICAL DATA:  Neuro deficit, acute, stroke suspected EXAM: MRI HEAD WITHOUT CONTRAST TECHNIQUE: Multiplanar, multiecho pulse sequences of the brain and surrounding structures were obtained without intravenous contrast. COMPARISON:  CT head 10/28/2022. FINDINGS: Brain: Extensive encephalomalacia throughout the right cerebral hemisphere with surrounding gliosis and volume loss. No evidence of acute infarct, acute hemorrhage, mass lesion, midline shift, or hydrocephalus. Evidence of prior hemorrhage in the right cerebral hemisphere. Vascular: None major arterial flow voids are maintained at the skull base. Skull and upper cervical spine: Normal marrow signal. Sinuses/Orbits: Largely clear sinuses.  No acute orbital findings. Other: No mastoid effusions. IMPRESSION: 1. No evidence of acute intracranial abnormality. 2. Extensive right cerebral hemisphere encephalomalacia/gliosis. Electronically Signed   By: Margaretha Sheffield M.D.   On: 10/28/2022 12:07   CT HEAD WO CONTRAST (5MM)  Result Date: 10/28/2022 CLINICAL DATA:  Syncope/presyncope with cerebrovascular cause suspected EXAM: CT HEAD WITHOUT CONTRAST TECHNIQUE: Contiguous axial images were obtained from the base of the  skull through the vertex without intravenous contrast. RADIATION DOSE REDUCTION: This exam was performed according to the departmental dose-optimization program which includes automated exposure control, adjustment of the mA and/or kV according to patient size and/or use of iterative reconstruction technique. COMPARISON:  09/07/2015 FINDINGS: Brain: No evidence of acute infarction, hemorrhage, hydrocephalus, extra-axial collection or mass lesion/mass effect. Large remote right MCA territory infarct with dense encephalomalacia. No acute hemorrhage, hydrocephalus, or collection. Vascular: Right M1 stenting. Skull: No acute or aggressive finding Sinuses/Orbits: No acute finding. IMPRESSION: 1. No acute finding. 2. Remote right MCA infarct. Electronically Signed   By: Jorje Guild M.D.   On: 10/28/2022 04:23   DG Chest 2 View  Result Date: 10/28/2022 CLINICAL DATA:  Chest pain, dizziness, and shortness of breath. EXAM: CHEST - 2 VIEW COMPARISON:  09/07/2015. FINDINGS: The heart size and mediastinal contours are within normal limits. Lung volumes are low. Mild atelectasis is noted at the right lung base. No consolidation, effusion, or pneumothorax. Degenerative changes are noted in the thoracic spine. IMPRESSION: Mild atelectasis at the right lung base. Electronically Signed   By: Brett Fairy M.D.   On: 10/28/2022 04:12    Assessment/Plan 1. Gastroesophageal reflux disease without esophagitis Symptoms controlled. H/H stable.No tarry or black stool  - advised to avoid eating meals late in the evening and to avoid aggravating foods and spices. -  continue on Protonix  - pantoprazole (PROTONIX) 40 MG tablet; Take 1 tablet (40 mg total) by mouth 2 (two) times daily.  Dispense: 180 tablet; Refill: 1  2. Essential hypertension B/p elevated during visit but did not take any of his medication prior to visit  - continue on amlodipine,Hydralazine and clonidine - hydrALAZINE (APRESOLINE) 25 MG tablet; Take 1  tablet (25 mg total) by mouth 3 (three) times daily.  Dispense: 270 tablet; Refill: 1  3. Type 2 diabetes mellitus without complication, without long-term current use of insulin (HCC) Lab Results  Component Value Date   HGBA1C 6.8 (H) 06/21/2022  Off medication  - continue dietary modification and exercise   4. 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  - Ambulatory referral to Home Health  5. Contracture of left hand HH PT to evaluate.may need Occupation therapy too.  - Ambulatory referral to [facility]  6. Vascular dementia without behavioral disturbance ([place]) At baseline  Requires assistance with ADL's   7. Encounter for completion of form with patient Placard form completed and signed during visit.Original copy given by Clyde Hill.   Family/ staff Communication: Reviewed plan of care with patient and Friend verbalized understanding.   Labs/tests ordered: None   Next Appointment: Return if symptoms worsen or fail to improve.    Sandrea Hughs, NP

## 2022-11-07 ENCOUNTER — Other Ambulatory Visit: Payer: Self-pay | Admitting: Family

## 2022-11-07 DIAGNOSIS — I10 Essential (primary) hypertension: Secondary | ICD-10-CM

## 2022-11-07 DIAGNOSIS — E1169 Type 2 diabetes mellitus with other specified complication: Secondary | ICD-10-CM

## 2022-11-07 DIAGNOSIS — E1149 Type 2 diabetes mellitus with other diabetic neurological complication: Secondary | ICD-10-CM

## 2022-11-15 ENCOUNTER — Ambulatory Visit (INDEPENDENT_AMBULATORY_CARE_PROVIDER_SITE_OTHER): Payer: Medicare (Managed Care) | Admitting: Podiatry

## 2022-11-15 ENCOUNTER — Encounter: Payer: Self-pay | Admitting: Podiatry

## 2022-11-15 DIAGNOSIS — M79675 Pain in left toe(s): Secondary | ICD-10-CM

## 2022-11-15 DIAGNOSIS — M79674 Pain in right toe(s): Secondary | ICD-10-CM

## 2022-11-15 DIAGNOSIS — B351 Tinea unguium: Secondary | ICD-10-CM | POA: Diagnosis not present

## 2022-11-15 NOTE — Progress Notes (Signed)
Patient presents with caregiver with for subjective:   Patient ID: [name] [name]., male   DOB: 62 y.o.   MRN: [id number]   HPI Nailbeds 1-5 both feet that he cannot take care of and they become bothersome   ROS      Objective:  Physical Exam  Yellow mycotic nail infection with pain 1-5 both feet     Assessment:  Chronic mycotic nail infection with pain 1-5 both feet     Plan:  Debrided painful nailbeds 1-5 both feet no angiogenic bleeding reappoint routine care for nail trimming

## 2022-11-16 ENCOUNTER — Ambulatory Visit: Payer: Medicare (Managed Care) | Admitting: Podiatry

## 2022-11-17 ENCOUNTER — Telehealth: Payer: Self-pay

## 2022-11-17 DIAGNOSIS — M24542 Contracture, left hand: Secondary | ICD-10-CM | POA: Diagnosis not present

## 2022-11-17 DIAGNOSIS — K219 Gastro-esophageal reflux disease without esophagitis: Secondary | ICD-10-CM | POA: Diagnosis not present

## 2022-11-17 DIAGNOSIS — I69354 Hemiplegia and hemiparesis following cerebral infarction affecting left non-dominant side: Secondary | ICD-10-CM | POA: Diagnosis not present

## 2022-11-17 DIAGNOSIS — I69391 Dysphagia following cerebral infarction: Secondary | ICD-10-CM | POA: Diagnosis not present

## 2022-11-17 DIAGNOSIS — Z8611 Personal history of tuberculosis: Secondary | ICD-10-CM | POA: Diagnosis not present

## 2022-11-17 DIAGNOSIS — G936 Cerebral edema: Secondary | ICD-10-CM | POA: Diagnosis not present

## 2022-11-17 DIAGNOSIS — E785 Hyperlipidemia, unspecified: Secondary | ICD-10-CM | POA: Diagnosis not present

## 2022-11-17 DIAGNOSIS — I10 Essential (primary) hypertension: Secondary | ICD-10-CM | POA: Diagnosis not present

## 2022-11-17 DIAGNOSIS — E119 Type 2 diabetes mellitus without complications: Secondary | ICD-10-CM | POA: Diagnosis not present

## 2022-11-17 DIAGNOSIS — F0150 Vascular dementia without behavioral disturbance: Secondary | ICD-10-CM | POA: Diagnosis not present

## 2022-11-17 NOTE — Telephone Encounter (Signed)
Noted.okay to give verbal orders.

## 2022-11-17 NOTE — Telephone Encounter (Signed)
Almira with [facility] called requesting verbal orders for PT twice a week for 3 weeks the once a week for 1 week. She also requested verbal order for speech therapy because patient complains of trouble swallowing.  Verbal orders given.  Message routed to Marlowe Sax, NP

## 2022-11-20 ENCOUNTER — Inpatient Hospital Stay (HOSPITAL_COMMUNITY)
Admission: EM | Admit: 2022-11-20 | Discharge: 2022-12-06 | DRG: 853 | Disposition: A | Discharge status: Skilled Nursing Facility | Payer: Medicare (Managed Care) | Attending: Internal Medicine | Admitting: Internal Medicine

## 2022-11-20 ENCOUNTER — Emergency Department (HOSPITAL_COMMUNITY): Payer: Medicare (Managed Care)

## 2022-11-20 DIAGNOSIS — N390 Urinary tract infection, site not specified: Secondary | ICD-10-CM | POA: Diagnosis present

## 2022-11-20 DIAGNOSIS — Z9989 Dependence on other enabling machines and devices: Secondary | ICD-10-CM

## 2022-11-20 DIAGNOSIS — E870 Hyperosmolality and hypernatremia: Secondary | ICD-10-CM

## 2022-11-20 DIAGNOSIS — E1100 Type 2 diabetes mellitus with hyperosmolarity without nonketotic hyperglycemic-hyperosmolar coma (NKHHC): Secondary | ICD-10-CM | POA: Diagnosis not present

## 2022-11-20 DIAGNOSIS — R278 Other lack of coordination: Secondary | ICD-10-CM | POA: Diagnosis not present

## 2022-11-20 DIAGNOSIS — D649 Anemia, unspecified: Secondary | ICD-10-CM | POA: Diagnosis present

## 2022-11-20 DIAGNOSIS — G9341 Metabolic encephalopathy: Secondary | ICD-10-CM | POA: Diagnosis present

## 2022-11-20 DIAGNOSIS — K8010 Calculus of gallbladder with chronic cholecystitis without obstruction: Secondary | ICD-10-CM | POA: Diagnosis not present

## 2022-11-20 DIAGNOSIS — K858 Other acute pancreatitis without necrosis or infection: Secondary | ICD-10-CM | POA: Diagnosis present

## 2022-11-20 DIAGNOSIS — M47814 Spondylosis without myelopathy or radiculopathy, thoracic region: Secondary | ICD-10-CM | POA: Diagnosis not present

## 2022-11-20 DIAGNOSIS — N179 Acute kidney failure, unspecified: Secondary | ICD-10-CM | POA: Diagnosis present

## 2022-11-20 DIAGNOSIS — J939 Pneumothorax, unspecified: Secondary | ICD-10-CM | POA: Diagnosis not present

## 2022-11-20 DIAGNOSIS — I69354 Hemiplegia and hemiparesis following cerebral infarction affecting left non-dominant side: Secondary | ICD-10-CM | POA: Diagnosis not present

## 2022-11-20 DIAGNOSIS — E1110 Type 2 diabetes mellitus with ketoacidosis without coma: Secondary | ICD-10-CM | POA: Diagnosis present

## 2022-11-20 DIAGNOSIS — I959 Hypotension, unspecified: Secondary | ICD-10-CM | POA: Diagnosis present

## 2022-11-20 DIAGNOSIS — E861 Hypovolemia: Secondary | ICD-10-CM | POA: Diagnosis present

## 2022-11-20 DIAGNOSIS — R0603 Acute respiratory distress: Secondary | ICD-10-CM | POA: Diagnosis not present

## 2022-11-20 DIAGNOSIS — D696 Thrombocytopenia, unspecified: Secondary | ICD-10-CM | POA: Diagnosis not present

## 2022-11-20 DIAGNOSIS — K8000 Calculus of gallbladder with acute cholecystitis without obstruction: Secondary | ICD-10-CM | POA: Diagnosis not present

## 2022-11-20 DIAGNOSIS — K5909 Other constipation: Secondary | ICD-10-CM | POA: Diagnosis present

## 2022-11-20 DIAGNOSIS — Y92009 Unspecified place in unspecified non-institutional (private) residence as the place of occurrence of the external cause: Secondary | ICD-10-CM

## 2022-11-20 DIAGNOSIS — R197 Diarrhea, unspecified: Secondary | ICD-10-CM | POA: Diagnosis present

## 2022-11-20 DIAGNOSIS — I6381 Other cerebral infarction due to occlusion or stenosis of small artery: Secondary | ICD-10-CM | POA: Diagnosis not present

## 2022-11-20 DIAGNOSIS — T68XXXA Hypothermia, initial encounter: Secondary | ICD-10-CM

## 2022-11-20 DIAGNOSIS — R918 Other nonspecific abnormal finding of lung field: Secondary | ICD-10-CM | POA: Diagnosis not present

## 2022-11-20 DIAGNOSIS — K429 Umbilical hernia without obstruction or gangrene: Secondary | ICD-10-CM | POA: Diagnosis present

## 2022-11-20 DIAGNOSIS — R410 Disorientation, unspecified: Secondary | ICD-10-CM | POA: Diagnosis not present

## 2022-11-20 DIAGNOSIS — B9620 Unspecified Escherichia coli [E. coli] as the cause of diseases classified elsewhere: Secondary | ICD-10-CM | POA: Diagnosis present

## 2022-11-20 DIAGNOSIS — E1169 Type 2 diabetes mellitus with other specified complication: Secondary | ICD-10-CM

## 2022-11-20 DIAGNOSIS — I69391 Dysphagia following cerebral infarction: Secondary | ICD-10-CM | POA: Diagnosis not present

## 2022-11-20 DIAGNOSIS — G459 Transient cerebral ischemic attack, unspecified: Secondary | ICD-10-CM | POA: Diagnosis not present

## 2022-11-20 DIAGNOSIS — Z7401 Bed confinement status: Secondary | ICD-10-CM | POA: Diagnosis not present

## 2022-11-20 DIAGNOSIS — Z1152 Encounter for screening for COVID-19: Secondary | ICD-10-CM

## 2022-11-20 DIAGNOSIS — R680 Hypothermia, not associated with low environmental temperature: Secondary | ICD-10-CM | POA: Diagnosis present

## 2022-11-20 DIAGNOSIS — W19XXXA Unspecified fall, initial encounter: Secondary | ICD-10-CM | POA: Diagnosis present

## 2022-11-20 DIAGNOSIS — R1110 Vomiting, unspecified: Secondary | ICD-10-CM | POA: Diagnosis not present

## 2022-11-20 DIAGNOSIS — K219 Gastro-esophageal reflux disease without esophagitis: Secondary | ICD-10-CM | POA: Diagnosis present

## 2022-11-20 DIAGNOSIS — R739 Hyperglycemia, unspecified: Secondary | ICD-10-CM | POA: Diagnosis not present

## 2022-11-20 DIAGNOSIS — Z87891 Personal history of nicotine dependence: Secondary | ICD-10-CM | POA: Diagnosis not present

## 2022-11-20 DIAGNOSIS — K8020 Calculus of gallbladder without cholecystitis without obstruction: Secondary | ICD-10-CM | POA: Diagnosis not present

## 2022-11-20 DIAGNOSIS — E785 Hyperlipidemia, unspecified: Secondary | ICD-10-CM | POA: Diagnosis present

## 2022-11-20 DIAGNOSIS — H7093 Unspecified mastoiditis, bilateral: Secondary | ICD-10-CM | POA: Diagnosis not present

## 2022-11-20 DIAGNOSIS — I10 Essential (primary) hypertension: Secondary | ICD-10-CM | POA: Diagnosis present

## 2022-11-20 DIAGNOSIS — Z811 Family history of alcohol abuse and dependence: Secondary | ICD-10-CM

## 2022-11-20 DIAGNOSIS — A419 Sepsis, unspecified organism: Principal | ICD-10-CM | POA: Diagnosis present

## 2022-11-20 DIAGNOSIS — R6520 Severe sepsis without septic shock: Secondary | ICD-10-CM | POA: Diagnosis present

## 2022-11-20 DIAGNOSIS — R404 Transient alteration of awareness: Secondary | ICD-10-CM | POA: Diagnosis not present

## 2022-11-20 DIAGNOSIS — I4891 Unspecified atrial fibrillation: Secondary | ICD-10-CM | POA: Diagnosis present

## 2022-11-20 DIAGNOSIS — F0153 Vascular dementia, unspecified severity, with mood disturbance: Secondary | ICD-10-CM | POA: Diagnosis present

## 2022-11-20 DIAGNOSIS — F329 Major depressive disorder, single episode, unspecified: Secondary | ICD-10-CM | POA: Diagnosis present

## 2022-11-20 DIAGNOSIS — Z4659 Encounter for fitting and adjustment of other gastrointestinal appliance and device: Secondary | ICD-10-CM | POA: Diagnosis not present

## 2022-11-20 DIAGNOSIS — E875 Hyperkalemia: Secondary | ICD-10-CM | POA: Diagnosis present

## 2022-11-20 DIAGNOSIS — G8110 Spastic hemiplegia affecting unspecified side: Secondary | ICD-10-CM | POA: Diagnosis not present

## 2022-11-20 DIAGNOSIS — R1310 Dysphagia, unspecified: Secondary | ICD-10-CM | POA: Diagnosis present

## 2022-11-20 DIAGNOSIS — R000 Tachycardia, unspecified: Secondary | ICD-10-CM | POA: Diagnosis not present

## 2022-11-20 DIAGNOSIS — R0902 Hypoxemia: Secondary | ICD-10-CM | POA: Diagnosis not present

## 2022-11-20 DIAGNOSIS — B964 Proteus (mirabilis) (morganii) as the cause of diseases classified elsewhere: Secondary | ICD-10-CM | POA: Diagnosis present

## 2022-11-20 DIAGNOSIS — M6281 Muscle weakness (generalized): Secondary | ICD-10-CM | POA: Diagnosis not present

## 2022-11-20 DIAGNOSIS — M47816 Spondylosis without myelopathy or radiculopathy, lumbar region: Secondary | ICD-10-CM | POA: Diagnosis not present

## 2022-11-20 DIAGNOSIS — R109 Unspecified abdominal pain: Secondary | ICD-10-CM | POA: Diagnosis not present

## 2022-11-20 DIAGNOSIS — T1490XA Injury, unspecified, initial encounter: Secondary | ICD-10-CM | POA: Diagnosis not present

## 2022-11-20 DIAGNOSIS — G9340 Encephalopathy, unspecified: Secondary | ICD-10-CM

## 2022-11-20 DIAGNOSIS — M6259 Muscle wasting and atrophy, not elsewhere classified, multiple sites: Secondary | ICD-10-CM | POA: Diagnosis not present

## 2022-11-20 DIAGNOSIS — Z4682 Encounter for fitting and adjustment of non-vascular catheter: Secondary | ICD-10-CM | POA: Diagnosis not present

## 2022-11-20 DIAGNOSIS — B957 Other staphylococcus as the cause of diseases classified elsewhere: Secondary | ICD-10-CM | POA: Diagnosis present

## 2022-11-20 DIAGNOSIS — Z79899 Other long term (current) drug therapy: Secondary | ICD-10-CM

## 2022-11-20 DIAGNOSIS — E1165 Type 2 diabetes mellitus with hyperglycemia: Secondary | ICD-10-CM | POA: Diagnosis not present

## 2022-11-20 DIAGNOSIS — K851 Biliary acute pancreatitis without necrosis or infection: Secondary | ICD-10-CM | POA: Diagnosis present

## 2022-11-20 DIAGNOSIS — E1149 Type 2 diabetes mellitus with other diabetic neurological complication: Secondary | ICD-10-CM

## 2022-11-20 DIAGNOSIS — Z8611 Personal history of tuberculosis: Secondary | ICD-10-CM

## 2022-11-20 DIAGNOSIS — Z9181 History of falling: Secondary | ICD-10-CM

## 2022-11-20 DIAGNOSIS — I6930 Unspecified sequelae of cerebral infarction: Secondary | ICD-10-CM | POA: Diagnosis not present

## 2022-11-20 DIAGNOSIS — E8729 Other acidosis: Secondary | ICD-10-CM

## 2022-11-20 DIAGNOSIS — Z7982 Long term (current) use of aspirin: Secondary | ICD-10-CM

## 2022-11-20 LAB — COMPREHENSIVE METABOLIC PANEL
ALT: 22 U/L (ref 0–44)
AST: 15 U/L (ref 15–41)
Albumin: 2.9 g/dL — ABNORMAL LOW (ref 3.5–5.0)
Alkaline Phosphatase: 155 U/L — ABNORMAL HIGH (ref 38–126)
Anion gap: 29 — ABNORMAL HIGH (ref 5–15)
BUN: 101 mg/dL — ABNORMAL HIGH (ref 8–23)
CO2: 14 mmol/L — ABNORMAL LOW (ref 22–32)
Calcium: 9.2 mg/dL (ref 8.9–10.3)
Chloride: 110 mmol/L (ref 98–111)
Creatinine, Ser: 5.52 mg/dL — ABNORMAL HIGH (ref 0.61–1.24)
GFR, Estimated: 11 mL/min — ABNORMAL LOW (ref >60)
Glucose, Bld: 1113 mg/dL (ref 70–99)
Potassium: 6.1 mmol/L — ABNORMAL HIGH (ref 3.5–5.1)
Sodium: 153 mmol/L — ABNORMAL HIGH (ref 135–145)
Total Bilirubin: 1.4 mg/dL — ABNORMAL HIGH (ref 0.3–1.2)
Total Protein: 8 g/dL (ref 6.5–8.1)

## 2022-11-20 LAB — CBC WITH DIFFERENTIAL/PLATELET
Abs Immature Granulocytes: 0.06 10*3/uL (ref 0.00–0.07)
Basophils Absolute: 0 10*3/uL (ref 0.0–0.1)
Basophils Relative: 0 %
Eosinophils Absolute: 0 10*3/uL (ref 0.0–0.5)
Eosinophils Relative: 0 %
HCT: 51.5 % (ref 39.0–52.0)
Hemoglobin: 15.5 g/dL (ref 13.0–17.0)
Immature Granulocytes: 0 %
Lymphocytes Relative: 3 %
Lymphs Abs: 0.5 10*3/uL — ABNORMAL LOW (ref 0.7–4.0)
MCH: 29.1 pg (ref 26.0–34.0)
MCHC: 30.1 g/dL (ref 30.0–36.0)
MCV: 96.6 fL (ref 80.0–100.0)
Monocytes Absolute: 0.9 10*3/uL (ref 0.1–1.0)
Monocytes Relative: 6 %
Neutro Abs: 14.5 10*3/uL — ABNORMAL HIGH (ref 1.7–7.7)
Neutrophils Relative %: 91 %
Platelets: 202 10*3/uL (ref 150–400)
RBC: 5.33 MIL/uL (ref 4.22–5.81)
RDW: 16 % — ABNORMAL HIGH (ref 11.5–15.5)
WBC: 16 10*3/uL — ABNORMAL HIGH (ref 4.0–10.5)
nRBC: 0 % (ref 0.0–0.2)

## 2022-11-20 LAB — APTT: aPTT: 22 seconds — ABNORMAL LOW (ref 24–36)

## 2022-11-20 LAB — URINALYSIS, MICROSCOPIC (REFLEX): WBC, UA: >50 WBC/hpf (ref 0–5)

## 2022-11-20 LAB — I-STAT ARTERIAL BLOOD GAS, ED
Acid-base deficit: 13 mmol/L — ABNORMAL HIGH (ref 0.0–2.0)
Bicarbonate: 12.2 mmol/L — ABNORMAL LOW (ref 20.0–28.0)
Calcium, Ion: 1.17 mmol/L (ref 1.15–1.40)
HCT: 38 % — ABNORMAL LOW (ref 39.0–52.0)
Hemoglobin: 12.9 g/dL — ABNORMAL LOW (ref 13.0–17.0)
O2 Saturation: 98 %
Patient temperature: 94.8
Potassium: 6.5 mmol/L (ref 3.5–5.1)
Sodium: 152 mmol/L — ABNORMAL HIGH (ref 135–145)
TCO2: 13 mmol/L — ABNORMAL LOW (ref 22–32)
pCO2 arterial: 24.1 mmHg — ABNORMAL LOW (ref 32–48)
pH, Arterial: 7.302 — ABNORMAL LOW (ref 7.35–7.45)
pO2, Arterial: 109 mmHg — ABNORMAL HIGH (ref 83–108)

## 2022-11-20 LAB — BETA-HYDROXYBUTYRIC ACID: Beta-Hydroxybutyric Acid: >8 mmol/L — ABNORMAL HIGH (ref 0.05–0.27)

## 2022-11-20 LAB — URINALYSIS, ROUTINE W REFLEX MICROSCOPIC
Glucose, UA: >=500 mg/dL — AB
Ketones, ur: 15 mg/dL — AB
Nitrite: POSITIVE — AB
Protein, ur: >300 mg/dL — AB
Specific Gravity, Urine: 1.025 (ref 1.005–1.030)
pH: 6 (ref 5.0–8.0)

## 2022-11-20 LAB — PROTIME-INR
INR: 1.2 (ref 0.8–1.2)
Prothrombin Time: 14.8 seconds (ref 11.4–15.2)

## 2022-11-20 LAB — CBG MONITORING, ED
Glucose-Capillary: >600 mg/dL (ref 70–99)
Glucose-Capillary: >600 mg/dL (ref 70–99)

## 2022-11-20 LAB — LACTIC ACID, PLASMA: Lactic Acid, Venous: 3.2 mmol/L (ref 0.5–1.9)

## 2022-11-20 LAB — TROPONIN I (HIGH SENSITIVITY): Troponin I (High Sensitivity): 20 ng/L — ABNORMAL HIGH (ref <18)

## 2022-11-20 MED ORDER — VANCOMYCIN HCL 2000 MG/400ML IV SOLN
2000.0000 mg | Freq: Once | INTRAVENOUS | Status: AC
  Administered 2022-11-20: 2000 mg via INTRAVENOUS
  Filled 2022-11-20: qty 400

## 2022-11-20 MED ORDER — FLUCONAZOLE IN SODIUM CHLORIDE 200-0.9 MG/100ML-% IV SOLN
200.0000 mg | Freq: Every day | INTRAVENOUS | Status: DC
  Administered 2022-11-21 – 2022-11-22 (×2): 200 mg via INTRAVENOUS
  Filled 2022-11-20 (×2): qty 100

## 2022-11-20 MED ORDER — DEXTROSE IN LACTATED RINGERS 5 % IV SOLN: INTRAVENOUS | Status: DC

## 2022-11-20 MED ORDER — OLANZAPINE 10 MG IM SOLR
5.0000 mg | Freq: Once | INTRAMUSCULAR | Status: DC
  Filled 2022-11-20: qty 10

## 2022-11-20 MED ORDER — METRONIDAZOLE 500 MG/100ML IV SOLN
500.0000 mg | Freq: Once | INTRAVENOUS | Status: AC
  Administered 2022-11-20: 500 mg via INTRAVENOUS
  Filled 2022-11-20: qty 100

## 2022-11-20 MED ORDER — CALCIUM GLUCONATE-NACL 1-0.675 GM/50ML-% IV SOLN
1.0000 g | Freq: Once | INTRAVENOUS | Status: AC
  Administered 2022-11-20: 1000 mg via INTRAVENOUS
  Filled 2022-11-20: qty 50

## 2022-11-20 MED ORDER — LACTATED RINGERS IV SOLN: INTRAVENOUS | Status: DC

## 2022-11-20 MED ORDER — VANCOMYCIN VARIABLE DOSE PER UNSTABLE RENAL FUNCTION (PHARMACIST DOSING): Status: DC

## 2022-11-20 MED ORDER — DEXTROSE 50 % IV SOLN: 0.0000 mL | INTRAVENOUS | Status: DC | PRN

## 2022-11-20 MED ORDER — INSULIN REGULAR(HUMAN) IN NACL 100-0.9 UT/100ML-% IV SOLN
INTRAVENOUS | Status: DC
  Administered 2022-11-20: 5 [IU]/h via INTRAVENOUS
  Filled 2022-11-20: qty 100

## 2022-11-20 MED ORDER — VANCOMYCIN HCL IN DEXTROSE 1-5 GM/200ML-% IV SOLN: 1000.0000 mg | Freq: Once | INTRAVENOUS | Status: DC

## 2022-11-20 MED ORDER — STERILE WATER FOR INJECTION IJ SOLN
INTRAMUSCULAR | Status: AC
  Administered 2022-11-20: 10 mL
  Filled 2022-11-20: qty 10

## 2022-11-20 MED ORDER — OLANZAPINE 10 MG IM SOLR
5.0000 mg | Freq: Once | INTRAMUSCULAR | Status: AC
  Administered 2022-11-20: 5 mg via INTRAMUSCULAR
  Filled 2022-11-20 (×2): qty 10

## 2022-11-20 MED ORDER — SODIUM CHLORIDE 0.9 % IV SOLN
2.0000 g | Freq: Once | INTRAVENOUS | Status: AC
  Administered 2022-11-20: 2 g via INTRAVENOUS
  Filled 2022-11-20: qty 12.5

## 2022-11-20 MED ORDER — SODIUM CHLORIDE 0.9 % IV SOLN
2.0000 g | INTRAVENOUS | Status: DC
  Administered 2022-11-21 – 2022-11-22 (×2): 2 g via INTRAVENOUS
  Filled 2022-11-20 (×2): qty 12.5

## 2022-11-20 NOTE — ED Notes (Signed)
EDP attempting for central line at bedside

## 2022-11-20 NOTE — ED Provider Notes (Signed)
[place] EMERGENCY DEPARTMENT Provider Note   CSN: [id number] Arrival date & time: 11/20/22  1908     History {Add pertinent medical, surgical, social history, OB history to HPI:1} No chief complaint on file.   [name] Kurek. is a 62 y.o. male who presents emergency department via EMS for altered mental status.  History is given by EMS and by the patient's sister.  Patient unable to provide history due to altered mental status and acuity of condition.  According to the sister the patient has been more lethargic all week.  On Tuesday he had a fall which is abnormal for him.  He does use a walker at home.  He lives by himself and family checks on him frequently.  He apparently also was constipated and either by misunderstanding or due to being confused a Dulcolax suppository.  Patient sister reports that he has had multiple episodes of diarrhea this week after eating the laxative.  She last spoke with him yesterday and he seemed to be in his normal state of mental status but was very sleepy and lethargic.  Today he was found on the floor, abnormal speech, weak and unable to get himself up.  EMS reports that they are concerned about sepsis.  They initiated code sepsis.  They found him to be in atrial fibrillation with rapid ventricular rate.  He does not appear to have a history of A-fib with RVR.  HPI     Home Medications Prior to Admission medications   Medication Sig Start Date End Date Taking? Authorizing Provider  Acetaminophen (TYLENOL PO) Take 1 tablet by mouth as needed (Pain).    [provider]  amLODipine (NORVASC) 10 MG tablet TAKE 1 TABLET (10 MG TOTAL) BY MOUTH DAILY. I10 11/04/22   Ngetich, Dinah C, NP  aspirin 325 MG tablet Take 1 tablet (325 mg total) by mouth daily. 08/14/15   Mercy Riding, MD  atorvastatin (LIPITOR) 80 MG tablet TAKE 1/2 TABLET BY MOUTH EVERY DAY 11/09/22   Ngetich, Dinah C, NP  cloNIDine (CATAPRES) 0.1 MG tablet TAKE 1 TABLET  (0.1 MG TOTAL) BY MOUTH EVERY 8 (EIGHT) HOURS AS NEEDED. 09/09/21   Ngetich, Dinah C, NP  Doxylamine Succinate, Sleep, (SLEEP-AID PO) Take by mouth.    [provider]  FLUoxetine (PROZAC) 10 MG capsule TAKE 1 CAPSULE BY MOUTH EVERY DAY 03/04/22   Ngetich, Dinah C, NP  hydrALAZINE (APRESOLINE) 25 MG tablet Take 1 tablet (25 mg total) by mouth 3 (three) times daily. 11/04/22   Ngetich, Dinah C, NP  ibuprofen (ADVIL) 800 MG tablet Take 800 mg by mouth every 6 (six) hours as needed. 12/07/21   [provider]  meclizine (ANTIVERT) 25 MG tablet Take 1 tablet (25 mg total) by mouth 3 (three) times daily as needed for dizziness. 10/28/22   Cristie Hem, MD  OMEGA-3 FATTY ACIDS PO Take 2 Gm capsule  by mouth twice daily    [provider]  pantoprazole (PROTONIX) 40 MG tablet Take 1 tablet (40 mg total) by mouth 2 (two) times daily. 11/04/22   Ngetich, Nelda Bucks, NP      Allergies    Patient has no known allergies.    Review of Systems   Review of Systems  Physical Exam Updated Vital Signs BP 101/66   Pulse 74   Temp (!) 94.9 F (34.9 C) (Rectal)   Resp 18   SpO2 100%  Physical Exam Vitals reviewed.  Constitutional:  General: He is in acute distress.     Appearance: He is ill-appearing and toxic-appearing.  HENT:     Head: Normocephalic and atraumatic.     Mouth/Throat:     Mouth: Mucous membranes are dry.     Comments: Dry mucosa, sticky oral secretions are dried to the lips and teeth.  He appears to have coated tongue and oropharynx Eyes:     Extraocular Movements: Extraocular movements intact.     Pupils: Pupils are equal, round, and reactive to light.  Cardiovascular:     Rate and Rhythm: Tachycardia present. Rhythm irregularly irregular.  Pulmonary:     Effort: Tachypnea present.  Abdominal:     General: There is no distension.     Tenderness: There is no abdominal tenderness.  Genitourinary:    Pubic Area: No rash.      Penis:  Uncircumcised.   Musculoskeletal:     Cervical back: Neck supple.  Skin:    General: Skin is cool.     Comments: Cool peripheral extremities.  Patient feels cool to touch all over.  No noted wounds or rashes  Neurological:     Mental Status: He is lethargic.     GCS: GCS eye subscore is 2. GCS verbal subscore is 4.    ED Results / Procedures / Treatments   Labs (all labs ordered are listed, but only abnormal results are displayed) Labs Reviewed  COMPREHENSIVE METABOLIC PANEL - Abnormal; Notable for the following components:      Result Value   Sodium 153 (*)    Potassium 6.1 (*)    CO2 14 (*)    Glucose, Bld 1,113 (*)    BUN 101 (*)    Creatinine, Ser 5.52 (*)    Albumin 2.9 (*)    Alkaline Phosphatase 155 (*)    Total Bilirubin 1.4 (*)    GFR, Estimated 11 (*)    Anion gap 29 (*)    All other components within normal limits  CBC WITH DIFFERENTIAL/PLATELET - Abnormal; Notable for the following components:   WBC 16.0 (*)    RDW 16.0 (*)    Neutro Abs 14.5 (*)    Lymphs Abs 0.5 (*)    All other components within normal limits  APTT - Abnormal; Notable for the following components:   aPTT 22 (*)    All other components within normal limits  BETA-HYDROXYBUTYRIC ACID - Abnormal; Notable for the following components:   Beta-Hydroxybutyric Acid >8.00 (*)    All other components within normal limits  CBG MONITORING, ED - Abnormal; Notable for the following components:   Glucose-Capillary >600 (*)    All other components within normal limits  TROPONIN I (HIGH SENSITIVITY) - Abnormal; Notable for the following components:   Troponin I (High Sensitivity) 20 (*)    All other components within normal limits  RESP PANEL BY RT-PCR (RSV, FLU A&B, COVID)  RVPGX2  CULTURE, BLOOD (ROUTINE X 2)  CULTURE, BLOOD (ROUTINE X 2)  PROTIME-INR  LACTIC ACID, PLASMA  LACTIC ACID, PLASMA  URINALYSIS, ROUTINE W REFLEX MICROSCOPIC  OSMOLALITY  TROPONIN I (HIGH SENSITIVITY)     EKG None  Radiology CT Head Wo Contrast  Result Date: 11/20/2022 CLINICAL DATA:  Initial evaluation for neuro deficit, stroke suspected. EXAM: CT HEAD WITHOUT CONTRAST TECHNIQUE: Contiguous axial images were obtained from the base of the skull through the vertex without intravenous contrast. RADIATION DOSE REDUCTION: This exam was performed according to the departmental dose-optimization program which includes automated exposure  control, adjustment of the mA and/or kV according to patient size and/or use of iterative reconstruction technique. COMPARISON:  Prior study from 10/28/2022. FINDINGS: Brain: Age-related cerebral atrophy. Large remote right MCA distribution infarct with extensive encephalomalacia throughout the right cerebral hemisphere. Remote lacunar infarct present at the left basal ganglia. No acute intracranial hemorrhage. No acute large vessel territory infarct. No mass lesion or significant midline shift. Ex vacuo dilatation of the right lateral ventricle related to the chronic right cerebral encephalomalacia. No hydrocephalus. No extra-axial fluid collection. Vascular: Vascular stent in place within the right M1 segment. No new hyperdense vessel. Skull: Scalp soft tissues and calvarium demonstrate no acute finding. Sinuses/Orbits: Globes and orbital soft tissues within normal limits. Paranasal sinuses are largely clear. No mastoid effusion. Other: None. IMPRESSION: 1. No acute intracranial abnormality. 2. Large remote right MCA distribution infarct with additional remote lacunar infarct at the left basal ganglia. 3. Vascular stent in place within the right M1 segment. Electronically Signed   By: Jeannine Boga M.D.   On: 11/20/2022 21:20   DG Chest Port 1 View  Result Date: 11/20/2022 CLINICAL DATA:  Questionable sepsis - evaluate for abnormality EXAM: PORTABLE CHEST 1 VIEW COMPARISON:  October 28, 2022, August 08, 2015 FINDINGS: Evaluation is limited by positioning. The  cardiomediastinal silhouette is unchanged in contour.Unchanged elevation of the RIGHT hemidiaphragm. No pleural effusion. No pneumothorax. Asymmetric density at the RIGHT apex, favored due to vascular shadow from patient positioning. IMPRESSION: Asymmetric density at the RIGHT apex, favored due to vascular shadow from patient positioning. Recommend improved evaluation with dedicated PA and lateral chest radiograph. Otherwise no acute cardiopulmonary abnormality. Electronically Signed   By: Valentino Saxon M.D.   On: 11/20/2022 20:51    Procedures .Critical Care  Performed by: Margarita Mail, PA-C Authorized by: Margarita Mail, PA-C   Critical care provider statement:    Critical care time (minutes):  90   Critical care time was exclusive of:  Separately billable procedures and treating other patients   Critical care was necessary to treat or prevent imminent or life-threatening deterioration of the following conditions:  Metabolic crisis, renal failure, CNS failure or compromise, dehydration and sepsis   Critical care was time spent personally by me on the following activities:  Development of treatment plan with patient or surrogate, discussions with consultants, evaluation of patient's response to treatment, examination of patient, ordering and review of laboratory studies, ordering and review of radiographic studies, ordering and performing treatments and interventions, pulse oximetry, re-evaluation of patient's condition, review of old charts, obtaining history from patient or surrogate, interpretation of cardiac output measurements and vascular access procedures .Central Line  Date/Time: 11/20/2022 10:17 PM  Performed by: Margarita Mail, PA-C Authorized by: Margarita Mail, PA-C   Consent:    Consent obtained:  Emergent situation Universal protocol:    Patient identity confirmed:  Arm band Pre-procedure details:    Indication(s): central venous access     Hand hygiene: Hand hygiene  performed prior to insertion     Sterile barrier technique: All elements of maximal sterile technique followed     Skin preparation:  Chlorhexidine   Skin preparation agent: Skin preparation agent completely dried prior to procedure   Sedation:    Sedation type:  Anxiolysis Anesthesia:    Anesthesia method:  Local infiltration   Local anesthetic:  Lidocaine 1% w/o epi Procedure details:    Location:  R femoral   Patient position:  Supine   Procedural supplies:  Triple lumen   Catheter  size:  7 Fr   Landmarks identified: yes     Ultrasound guidance: yes     Ultrasound guidance timing: real time     Sterile ultrasound techniques: Sterile gel and sterile probe covers were used     Number of attempts:  2   Successful placement: yes   Post-procedure details:    Post-procedure:  Dressing applied and line sutured   Assessment:  Blood return through all ports and free fluid flow   Procedure completion:  Tolerated   {Document cardiac monitor, telemetry assessment procedure when appropriate:1}  Medications Ordered in ED Medications  lactated ringers infusion (has no administration in time range)  ceFEPIme (MAXIPIME) 2 g in sodium chloride 0.9 % 100 mL IVPB (has no administration in time range)  metroNIDAZOLE (FLAGYL) IVPB 500 mg (has no administration in time range)  vancomycin (VANCOREADY) IVPB 2000 mg/400 mL (has no administration in time range)  OLANZapine (ZYPREXA) injection 5 mg (has no administration in time range)  sterile water (preservative free) injection (has no administration in time range)  insulin regular, human (MYXREDLIN) 100 units/ 100 mL infusion (has no administration in time range)  dextrose 5 % in lactated ringers infusion (has no administration in time range)  dextrose 50 % solution 0-50 mL (has no administration in time range)    ED Course/ Medical Decision Making/ A&P Clinical Course as of 11/20/22 2231  Sat Nov 20, 2022  2041 EKG 12-Lead [AH]  2212 Temp(!):  94.9 F (34.9 C) [AH]  2212 Sodium(!): 153 [AH]  2212 Anion gap(!): 29 [AH]  2215 Creatinine(!): 5.52 [AH]  2215 Potassium(!): 6.1 [AH]  2226 Lactic acid, plasma(!!) [AH]  2226 Lactic Acid, Venous(!!): 3.2 [AH]  2230 CT Head Wo Contrast [AH]    Clinical Course User Index [AH] Margarita Mail, PA-C   {   Click here for ABCD2, HEART and other calculatorsREFRESH Note before signing :1}                          Medical Decision Making Amount and/or Complexity of Data Reviewed Labs: ordered. Decision-making details documented in ED Course. Radiology: ordered. ECG/medicine tests: ordered. Decision-making details documented in ED Course.  Risk Prescription drug management.   ***  {Document critical care time when appropriate:1} {Document review of labs and clinical decision tools ie heart score, Chads2Vasc2 etc:1}  {Document your independent review of radiology images, and any outside records:1} {Document your discussion with family members, caretakers, and with consultants:1} {Document social determinants of health affecting pt's care:1} {Document your decision making why or why not admission, treatments were needed:1} Final Clinical Impression(s) / ED Diagnoses Final diagnoses:  None    Rx / DC Orders ED Discharge Orders     None

## 2022-11-20 NOTE — ED Notes (Signed)
Patient transported to CT 

## 2022-11-20 NOTE — ED Notes (Signed)
Bear hugger placed

## 2022-11-20 NOTE — H&P (Signed)
NAME:  [name] Ion., MRN:  [id number], DOB:  04-21-1961, LOS: 0 ADMISSION DATE:  11/20/2022, CONSULTATION DATE:  1/13 REFERRING MD:  Dr. Nechama Guard, CHIEF COMPLAINT:  DKA   History of Present Illness:  Patient is a 62 year old male with pertinent PMH of DMT2 controlled by diet, HTN, HLD, previous R CVA with left-sided deficits presents to [facility] ED on 1/13 with AMS.  Per patient's sister patient has been lethargic over the past week.  Patient uses walker at home and had a fall on 1/9.  Patient stated that he was having constipation and was taking Dulcolax suppositories.  Per patient's sister patient has been having multiple episodes of diarrhea over the past week.  States that he was more alert on 1/12 but noticed that he was more sleepy compared to normal.  On 1/13 patient found on floor with confusion.  EMS called and transferred patient to [facility].  Upon arrival to [facility] ED, patient tachycardia 140s found to be in A-fib RVR.  Glucose 1100, AG 29, CO2 14, pH 7.30, beta H > 8.  Patient started on insulin per Endo tool and IV fluids given. Patient confused. Patient temp 94.9 F and WBC 16.  LA 3.2.  Cultures obtained.  Started on broad-spectrum antibiotics.  CXR with no significant findings.  PCCM consulted for ICU admission.  Pertinent ED labs: NA 153, K6.1, creat 5.5, Trop 20  Pending: resp panel, ua/uc, bcx2  Pertinent  Medical History   Past Medical History:  Diagnosis Date   Acute respiratory failure (HCC)    Cerebral edema (HCC)    Diabetes mellitus without complication ([place])    diet controlled- no meds   Dysphagia as late effect of cerebrovascular disease    Fall at home    Hemiparesis affecting left side as late effect of stroke ([place])    Hyperlipidemia    Hypertension    Stroke ([place]) 2016   Tuberculosis    11 yrs ago per pt.- treated with meds per pt.     Significant Hospital Events: Including procedures, antibiotic start and stop dates in addition to other pertinent events    1/13 admitted to [facility] encephalopathic; DKA; PCCM consulted  Interim History / Subjective:  See above  Objective   Blood pressure 97/80, pulse (!) 52, temperature (!) 94.9 F (34.9 C), temperature source Rectal, resp. rate (!) 22, weight 125.2 kg, SpO2 (!) 73 %.        Intake/Output Summary (Last 24 hours) at 11/20/2022 2257 Last data filed at 11/20/2022 1932 Gross per 24 hour  Intake 500 ml  Output --  Net 500 ml   Filed Weights   11/20/22 2251  Weight: 125.2 kg    Examination: General:   ill appearing male w/ ams HEENT: MM pink/moist; Reynolds in place Neuro: altered/confused; follows some commands; PERRL CV: s1s2, afib rate 130-140s, no m/r/g PULM:  dim clear BS bilaterally; Harvey 4L GI: soft, bsx4 active; diffuse abdominal pain upon palpation Extremities: warm/dry, no edema  Skin: no rashes or lesions    Resolved Hospital Problem list     Assessment & Plan:  DKA: glucose 1,100, co2 14, AG 29, ph 7.3-osmolality and ua pending T2DM: A1c 12/15/21 6.4; diet controlled Plan: -Insulin per endotool -CBG monitoring -Aggressive IV fluid resuscitation -trend B-Hydroxy -serial bmp -check A1c  Acute encephalopathy: likely due to above with component of sepsis Hx of R CVA: w/ left sided hemiparesis -CT head no acute abnormality; remote R mca infarct w/ vascular stent in  right M1 segment Plan: -continue treatment for DKA and abx as below for sepsis -limit sedating meds -check ammonia and tsh -check uds and ethanol -delirium precautions  Sepsis Hypothermia Abdominal pain Diarrhea Possible UTI: purulent urine after foley placement Plan: -cont bare hugger for normothermia -CT abd/pelvis -check lipase -aggressive IV fluids -broad spectrum abx; add anti-fungal coverage -f/u cultures and ua -flu, covid, rsv pending -trend trop/LA -trend wbc/fever curve  Hypernatremia: likely hypovolemia Hyperkalemia AKI Plan: -given calcium and started on insulin gtt -iv  fluids -serial bmp -Trend BMP / urinary output -Replace electrolytes as indicated -Avoid nephrotoxic agents, ensure adequate renal perfusion  New onset Afib RVR: likely in setting of hypovolemia and sepsis Plan: -telemetry monitoring -IV fluids as above -if remains in afib despite fluid resuscitation may need further eval and started on rate control medication and possible AC  Prolonged qtc Plan: -telemetry monitoring -check mag -avoid qtc prolonging agents  HTN HLD Plan: -hold anti-hypertensives/statin/ASA while npo -prn iv hydralazine for HTN  GERD Plan: -PPI  MDD Plan: -hold fluoxetine given prolonged qtc  Best Practice (right click and "Reselect all SmartList Selections" daily)   Diet/type: NPO DVT prophylaxis: prophylactic heparin  GI prophylaxis: PPI Lines: Central line Foley:  Yes, and it is still needed Code Status:  full code Last date of multidisciplinary goals of care discussion [updated sister [name] [name] on plan of care]  Labs   CBC: Recent Labs  Lab 11/20/22 1950  WBC 16.0*  NEUTROABS 14.5*  HGB 15.5  HCT 51.5  MCV 96.6  PLT 563    Basic Metabolic Panel: Recent Labs  Lab 11/20/22 1950  NA 153*  K 6.1*  CL 110  CO2 14*  GLUCOSE 1,113*  BUN 101*  CREATININE 5.52*  CALCIUM 9.2   GFR: Estimated Creatinine Clearance: 19 mL/min (A) (by C-G formula based on SCr of 5.52 mg/dL (H)). Recent Labs  Lab 11/20/22 1950  WBC 16.0*  LATICACIDVEN 3.2*    Liver Function Tests: Recent Labs  Lab 11/20/22 1950  AST 15  ALT 22  ALKPHOS 155*  BILITOT 1.4*  PROT 8.0  ALBUMIN 2.9*   No results for input(s): "LIPASE", "AMYLASE" in the last 168 hours. No results for input(s): "AMMONIA" in the last 168 hours.  ABG    Component Value Date/Time   HCO3 19.5 (L) 07/23/2015 2131   TCO2 20 07/23/2015 2131   ACIDBASEDEF 4.0 (H) 07/23/2015 2131   O2SAT 96.0 07/23/2015 2131     Coagulation Profile: Recent Labs  Lab 11/20/22 1950  INR 1.2     Cardiac Enzymes: No results for input(s): "CKTOTAL", "CKMB", "CKMBINDEX", "TROPONINI" in the last 168 hours.  HbA1C: Hgb A1c MFr Bld  Date/Time Value Ref Range Status  06/21/2022 09:01 AM 6.8 (H) <5.7 % of total Hgb Final    Comment:    For someone without known diabetes, a hemoglobin A1c value of 6.5% or greater indicates that they may have  diabetes and this should be confirmed with a follow-up  test. . For someone with known diabetes, a value <7% indicates  that their diabetes is well controlled and a value  greater than or equal to 7% indicates suboptimal  control. A1c targets should be individualized based on  duration of diabetes, age, comorbid conditions, and  other considerations. . Currently, no consensus exists regarding use of hemoglobin A1c for diagnosis of diabetes for children. Marland Kitchen   12/15/2021 08:20 AM 6.4 (H) <5.7 % of total Hgb Final    Comment:  For someone without known diabetes, a hemoglobin  A1c value between 5.7% and 6.4% is consistent with prediabetes and should be confirmed with a  follow-up test. . For someone with known diabetes, a value <7% indicates that their diabetes is well controlled. A1c targets should be individualized based on duration of diabetes, age, comorbid conditions, and other considerations. . This assay result is consistent with an increased risk of diabetes. . Currently, no consensus exists regarding use of hemoglobin A1c for diagnosis of diabetes for children. .     CBG: Recent Labs  Lab 11/20/22 2019  GLUCAP >600*    Review of Systems:   Patient is encephalopathic. Therefore history has been obtained from chart review.    Past Medical History:  He,  has a past medical history of Acute respiratory failure ([place]), Cerebral edema ([place]), Diabetes mellitus without complication ([place]), Dysphagia as late effect of cerebrovascular disease, Fall at home, Hemiparesis affecting left side as late effect of stroke ([place]),  Hyperlipidemia, Hypertension, Stroke ([place]) (2016), and Tuberculosis.   Surgical History:   Past Surgical History:  Procedure Laterality Date   COLONOSCOPY  10/06/2016   ESOPHAGOGASTRODUODENOSCOPY N/A 08/12/2015   Procedure: ESOPHAGOGASTRODUODENOSCOPY (EGD);  Surgeon: Ladene Artist, MD;  Location: [facility] ENDOSCOPY;  Service: Endoscopy;  Laterality: N/A;   feeding tube removed     GASTROSTOMY TUBE PLACEMENT  05-19-15   mechanical thrombectomy angioplasty stenet placement     SKIN GRAFT  1980's   ankle   UPPER GASTROINTESTINAL ENDOSCOPY       Social History:   reports that he quit smoking about 2 years ago. His smoking use included cigarettes. He smoked an average of .5 packs per day. He has never used smokeless tobacco. He reports that he does not drink alcohol and does not use drugs.   Family History:  His family history includes Alcohol abuse in his brother, father, and sister. There is no history of Colon cancer, Colon polyps, Rectal cancer, Stomach cancer, or Esophageal cancer.   Allergies No Known Allergies   Home Medications  Prior to Admission medications   Medication Sig Start Date End Date Taking? Authorizing Provider  Acetaminophen (TYLENOL PO) Take 1 tablet by mouth as needed (Pain).    [provider]  amLODipine (NORVASC) 10 MG tablet TAKE 1 TABLET (10 MG TOTAL) BY MOUTH DAILY. I10 11/04/22   Ngetich, Dinah C, NP  aspirin 325 MG tablet Take 1 tablet (325 mg total) by mouth daily. 08/14/15   Mercy Riding, MD  atorvastatin (LIPITOR) 80 MG tablet TAKE 1/2 TABLET BY MOUTH EVERY DAY 11/09/22   Ngetich, Dinah C, NP  cloNIDine (CATAPRES) 0.1 MG tablet TAKE 1 TABLET (0.1 MG TOTAL) BY MOUTH EVERY 8 (EIGHT) HOURS AS NEEDED. 09/09/21   Ngetich, Dinah C, NP  Doxylamine Succinate, Sleep, (SLEEP-AID PO) Take by mouth.    [provider]  FLUoxetine (PROZAC) 10 MG capsule TAKE 1 CAPSULE BY MOUTH EVERY DAY 03/04/22   Ngetich, Dinah C, NP  hydrALAZINE (APRESOLINE) 25 MG  tablet Take 1 tablet (25 mg total) by mouth 3 (three) times daily. 11/04/22   Ngetich, Dinah C, NP  ibuprofen (ADVIL) 800 MG tablet Take 800 mg by mouth every 6 (six) hours as needed. 12/07/21   [provider]  meclizine (ANTIVERT) 25 MG tablet Take 1 tablet (25 mg total) by mouth 3 (three) times daily as needed for dizziness. 10/28/22   Cristie Hem, MD  OMEGA-3 FATTY ACIDS PO Take 2 Gm capsule  by mouth twice daily    [provider]  pantoprazole (PROTONIX) 40 MG tablet Take 1 tablet (40 mg total) by mouth 2 (two) times daily. 11/04/22   Ngetich, Nelda Bucks, NP     Critical care time: 45 minutes    JD Rexene Agent [facility] 11/20/2022, 11:51 PM  Please see [email or web address] for pager details.  From 7A-7P if no response, please call [phone]. After hours, please call ELink [phone].

## 2022-11-20 NOTE — ED Notes (Signed)
Patient pulled out IV ultrasound. EDP aware

## 2022-11-20 NOTE — Progress Notes (Signed)
Pharmacy Antibiotic Note  [name] [name]. is a 62 y.o. male admitted on 11/20/2022 with sepsis.  Pharmacy has been consulted for Vancomycin/Cefepime dosing. Found down on the floor today. WBC elevated. Acute renal failure with Scr 5.52 (baseline 1-1.2).   Plan: Vancomycin 2000 mg IV x 1, further dosing based on Scr trend Cefepime 2g IV q24h Trend WBC, temp, renal function  F/U infectious work-up Drug levels as indicated   Weight: 125.2 kg (276 lb) (from 11/04/22)  Temp (24hrs), Avg:94.9 F (34.9 C), Min:94.9 F (34.9 C), Max:94.9 F (34.9 C)  Recent Labs  Lab 11/20/22 1950  WBC 16.0*  CREATININE 5.52*  LATICACIDVEN 3.2*    Estimated Creatinine Clearance: 19 mL/min (A) (by C-G formula based on SCr of 5.52 mg/dL (H)).    No Known Allergies  Narda Bonds, PharmD, BCPS Clinical Pharmacist Phone: [phone]

## 2022-11-20 NOTE — ED Triage Notes (Signed)
BIB EMS from home hx of cva w/left sided deficits. Last seen Thursday, found on the floor todayy, patient not making sense   bp low 78/54, 90/56 hr high on scene

## 2022-11-20 NOTE — H&P (Incomplete)
NAME:  [name] Friedel., MRN:  [id number], DOB:  Jul 22, 1961, LOS: 0 ADMISSION DATE:  11/20/2022, CONSULTATION DATE:  1/13 REFERRING MD:  Dr. Nechama Guard, CHIEF COMPLAINT:  DKA   History of Present Illness:  Patient is a 62 year old male with pertinent PMH of DMT2 controlled by diet, HTN, HLD, previous R CVA with left-sided deficits presents to [facility] ED on 1/13 with AMS.  Per patient's sister patient has been lethargic over the past week.  Patient uses walker at home and had a fall on 1/9.  Patient stated that he was having constipation and was taking Dulcolax suppositories.  Per patient's sister patient has been having multiple episodes of diarrhea over the past week.  States that he was more alert on 1/12 but noticed that he was more sleepy compared to normal.  On 1/13 patient found on floor with confusion.  EMS called and transferred patient to [facility].  Upon arrival to [facility] ED, patient tachycardia 140s found to be in A-fib RVR.  Glucose 1100, AG 29, CO2 14, pH 7.30, beta H > 8.  Patient started on insulin per Endo tool and IV fluids given. Patient confused. Patient temp 94.9 F and WBC 16.  LA 3.2.  Cultures obtained.  Started on broad-spectrum antibiotics.  CXR with no significant findings.  PCCM consulted for ICU admission.  Pertinent ED labs: NA 153, K6.1, creat 5.5, Trop 20,   Pending: resp panel, ua/uc, bcx2,   Pertinent  Medical History   Past Medical History:  Diagnosis Date  . Acute respiratory failure ([place])   . Cerebral edema (HCC)   . Diabetes mellitus without complication (HCC)    diet controlled- no meds  . Dysphagia as late effect of cerebrovascular disease   . Fall at home   . Hemiparesis affecting left side as late effect of stroke ([place])   . Hyperlipidemia   . Hypertension   . Stroke ([place]) 2016  . Tuberculosis    11 yrs ago per pt.- treated with meds per pt.     Significant Hospital Events: Including procedures, antibiotic start and stop dates in addition to other  pertinent events   1/13 admitted to [facility] encephalopathic; DKA; PCCM consulted  Interim History / Subjective:  See above  Objective   Blood pressure 97/80, pulse (!) 52, temperature (!) 94.9 F (34.9 C), temperature source Rectal, resp. rate (!) 22, weight 125.2 kg, SpO2 (!) 73 %.        Intake/Output Summary (Last 24 hours) at 11/20/2022 2257 Last data filed at 11/20/2022 1932 Gross per 24 hour  Intake 500 ml  Output --  Net 500 ml   Filed Weights   11/20/22 2251  Weight: 125.2 kg    Examination: General:   ill appearing male w/ ams HEENT: MM pink/moist; [place] in place Neuro: altered/confused; follows some commands; PERRL CV: s1s2, afib rate 130-140s, no m/r/g PULM:  dim clear BS bilaterally; [place] 4L GI: soft, bsx4 active; diffuse abdominal pain upon palpation Extremities: warm/dry, no edema  Skin: no rashes or lesions    Resolved Hospital Problem list     Assessment & Plan:  DKA vs. HHS: glucose 1,100, co2 14, AG 29, ph 7.3-osmolality and ua pending T2DM: A1c 12/15/21 6.4; diet controlled Plan: -Insulin per endotool -CBG monitoring -Aggressive IV fluid resuscitation -trend B-Hydroxy -serial bmp -check A1c  Acute encephalopathy: likely due to above with component of sepsis Hx of R CVA: w/ left sided hemiparesis -CT head no acute abnormality; remote R mca infarct  w/ vascular stent in right M1 segment Plan: -continue treatment for HHS and abx as below for sepsis -limit sedating meds -check ammonia and tsh -check uds and ethanol -delirium precautions  Sepsis Abdominal pain Diarrhea Plan: -CT abd/pelvis -check lipase -aggressive IV fluids -broad spectrum abx; add anti-fungal coverage -f/u cultures and ua -flu, covid, rsv pending -trend trop/LA -trend wbc/fever curve  Hypernatremia: likely hypovolemia Hyperkalemia AKI Plan: -given calcium and started on insulin gtt -iv fluids -serial bmp -Trend BMP / urinary output -Replace electrolytes as  indicated -Avoid nephrotoxic agents, ensure adequate renal perfusion  HTN HLD Plan: -hold anti-hypertensives/statin/ASA while npo -consider prn hydralazine if becomes hypertensive  GERD Plan: -PPI  MDD Plan: -hold fluoxetine given prolonged qtc  Best Practice (right click and "Reselect all SmartList Selections" daily)   Diet/type: NPO DVT prophylaxis: prophylactic heparin  GI prophylaxis: PPI Lines: Central line Foley:  Yes, and it is still needed Code Status:  full code Last date of multidisciplinary goals of care discussion [updated sister [name] [name] on plan of care]  Labs   CBC: Recent Labs  Lab 11/20/22 1950  WBC 16.0*  NEUTROABS 14.5*  HGB 15.5  HCT 51.5  MCV 96.6  PLT 245    Basic Metabolic Panel: Recent Labs  Lab 11/20/22 1950  NA 153*  K 6.1*  CL 110  CO2 14*  GLUCOSE 1,113*  BUN 101*  CREATININE 5.52*  CALCIUM 9.2   GFR: Estimated Creatinine Clearance: 19 mL/min (A) (by C-G formula based on SCr of 5.52 mg/dL (H)). Recent Labs  Lab 11/20/22 1950  WBC 16.0*  LATICACIDVEN 3.2*    Liver Function Tests: Recent Labs  Lab 11/20/22 1950  AST 15  ALT 22  ALKPHOS 155*  BILITOT 1.4*  PROT 8.0  ALBUMIN 2.9*   No results for input(s): "LIPASE", "AMYLASE" in the last 168 hours. No results for input(s): "AMMONIA" in the last 168 hours.  ABG    Component Value Date/Time   HCO3 19.5 (L) 07/23/2015 2131   TCO2 20 07/23/2015 2131   ACIDBASEDEF 4.0 (H) 07/23/2015 2131   O2SAT 96.0 07/23/2015 2131     Coagulation Profile: Recent Labs  Lab 11/20/22 1950  INR 1.2    Cardiac Enzymes: No results for input(s): "CKTOTAL", "CKMB", "CKMBINDEX", "TROPONINI" in the last 168 hours.  HbA1C: Hgb A1c MFr Bld  Date/Time Value Ref Range Status  06/21/2022 09:01 AM 6.8 (H) <5.7 % of total Hgb Final    Comment:    For someone without known diabetes, a hemoglobin A1c value of 6.5% or greater indicates that they may have  diabetes and this should be  confirmed with a follow-up  test. . For someone with known diabetes, a value <7% indicates  that their diabetes is well controlled and a value  greater than or equal to 7% indicates suboptimal  control. A1c targets should be individualized based on  duration of diabetes, age, comorbid conditions, and  other considerations. . Currently, no consensus exists regarding use of hemoglobin A1c for diagnosis of diabetes for children. Marland Kitchen   12/15/2021 08:20 AM 6.4 (H) <5.7 % of total Hgb Final    Comment:    For someone without known diabetes, a hemoglobin  A1c value between 5.7% and 6.4% is consistent with prediabetes and should be confirmed with a  follow-up test. . For someone with known diabetes, a value <7% indicates that their diabetes is well controlled. A1c targets should be individualized based on duration of diabetes, age, comorbid conditions, and other  considerations. . This assay result is consistent with an increased risk of diabetes. . Currently, no consensus exists regarding use of hemoglobin A1c for diagnosis of diabetes for children. .     CBG: Recent Labs  Lab 11/20/22 2019  GLUCAP >600*    Review of Systems:   Patient is encephalopathic. Therefore history has been obtained from chart review.    Past Medical History:  He,  has a past medical history of Acute respiratory failure ([place]), Cerebral edema ([place]), Diabetes mellitus without complication ([place]), Dysphagia as late effect of cerebrovascular disease, Fall at home, Hemiparesis affecting left side as late effect of stroke ([place]), Hyperlipidemia, Hypertension, Stroke ([place]) (2016), and Tuberculosis.   Surgical History:   Past Surgical History:  Procedure Laterality Date  . COLONOSCOPY  10/06/2016  . ESOPHAGOGASTRODUODENOSCOPY N/A 08/12/2015   Procedure: ESOPHAGOGASTRODUODENOSCOPY (EGD);  Surgeon: Ladene Artist, MD;  Location: [facility] ENDOSCOPY;  Service: Endoscopy;  Laterality: N/A;  . feeding tube removed     . GASTROSTOMY TUBE PLACEMENT  05-19-15  . mechanical thrombectomy angioplasty stenet placement    . SKIN GRAFT  1980's   ankle  . UPPER GASTROINTESTINAL ENDOSCOPY       Social History:   reports that he quit smoking about 2 years ago. His smoking use included cigarettes. He smoked an average of .5 packs per day. He has never used smokeless tobacco. He reports that he does not drink alcohol and does not use drugs.   Family History:  His family history includes Alcohol abuse in his brother, father, and sister. There is no history of Colon cancer, Colon polyps, Rectal cancer, Stomach cancer, or Esophageal cancer.   Allergies No Known Allergies   Home Medications  Prior to Admission medications   Medication Sig Start Date End Date Taking? Authorizing Provider  Acetaminophen (TYLENOL PO) Take 1 tablet by mouth as needed (Pain).    [provider]  amLODipine (NORVASC) 10 MG tablet TAKE 1 TABLET (10 MG TOTAL) BY MOUTH DAILY. I10 11/04/22   Ngetich, Dinah C, NP  aspirin 325 MG tablet Take 1 tablet (325 mg total) by mouth daily. 08/14/15   Mercy Riding, MD  atorvastatin (LIPITOR) 80 MG tablet TAKE 1/2 TABLET BY MOUTH EVERY DAY 11/09/22   Ngetich, Dinah C, NP  cloNIDine (CATAPRES) 0.1 MG tablet TAKE 1 TABLET (0.1 MG TOTAL) BY MOUTH EVERY 8 (EIGHT) HOURS AS NEEDED. 09/09/21   Ngetich, Dinah C, NP  Doxylamine Succinate, Sleep, (SLEEP-AID PO) Take by mouth.    [provider]  FLUoxetine (PROZAC) 10 MG capsule TAKE 1 CAPSULE BY MOUTH EVERY DAY 03/04/22   Ngetich, Dinah C, NP  hydrALAZINE (APRESOLINE) 25 MG tablet Take 1 tablet (25 mg total) by mouth 3 (three) times daily. 11/04/22   Ngetich, Dinah C, NP  ibuprofen (ADVIL) 800 MG tablet Take 800 mg by mouth every 6 (six) hours as needed. 12/07/21   [provider]  meclizine (ANTIVERT) 25 MG tablet Take 1 tablet (25 mg total) by mouth 3 (three) times daily as needed for dizziness. 10/28/22   Cristie Hem, MD  OMEGA-3  FATTY ACIDS PO Take 2 Gm capsule  by mouth twice daily    [provider]  pantoprazole (PROTONIX) 40 MG tablet Take 1 tablet (40 mg total) by mouth 2 (two) times daily. 11/04/22   Ngetich, Nelda Bucks, NP     Critical care time: 45 minutes    JD Rexene Agent [facility] 11/20/2022, 11:51 PM  Please see [email or web address] for pager details.  From 7A-7P if no response, please call [phone]. After hours, please call ELink [phone].

## 2022-11-20 NOTE — Sepsis Progress Note (Signed)
Elink following for Sepsis Protocol 

## 2022-11-21 ENCOUNTER — Inpatient Hospital Stay (HOSPITAL_COMMUNITY): Payer: Medicare (Managed Care)

## 2022-11-21 DIAGNOSIS — Z1152 Encounter for screening for COVID-19: Secondary | ICD-10-CM | POA: Diagnosis not present

## 2022-11-21 DIAGNOSIS — I4891 Unspecified atrial fibrillation: Secondary | ICD-10-CM

## 2022-11-21 DIAGNOSIS — I10 Essential (primary) hypertension: Secondary | ICD-10-CM | POA: Diagnosis present

## 2022-11-21 DIAGNOSIS — Y92009 Unspecified place in unspecified non-institutional (private) residence as the place of occurrence of the external cause: Secondary | ICD-10-CM | POA: Diagnosis not present

## 2022-11-21 DIAGNOSIS — W19XXXA Unspecified fall, initial encounter: Secondary | ICD-10-CM | POA: Diagnosis present

## 2022-11-21 DIAGNOSIS — E875 Hyperkalemia: Secondary | ICD-10-CM

## 2022-11-21 DIAGNOSIS — I959 Hypotension, unspecified: Secondary | ICD-10-CM | POA: Diagnosis present

## 2022-11-21 DIAGNOSIS — E1165 Type 2 diabetes mellitus with hyperglycemia: Secondary | ICD-10-CM | POA: Diagnosis not present

## 2022-11-21 DIAGNOSIS — E870 Hyperosmolality and hypernatremia: Secondary | ICD-10-CM

## 2022-11-21 DIAGNOSIS — D696 Thrombocytopenia, unspecified: Secondary | ICD-10-CM | POA: Diagnosis not present

## 2022-11-21 DIAGNOSIS — K851 Biliary acute pancreatitis without necrosis or infection: Secondary | ICD-10-CM | POA: Diagnosis present

## 2022-11-21 DIAGNOSIS — N179 Acute kidney failure, unspecified: Secondary | ICD-10-CM

## 2022-11-21 DIAGNOSIS — G9340 Encephalopathy, unspecified: Secondary | ICD-10-CM

## 2022-11-21 DIAGNOSIS — A419 Sepsis, unspecified organism: Secondary | ICD-10-CM | POA: Diagnosis present

## 2022-11-21 DIAGNOSIS — K8000 Calculus of gallbladder with acute cholecystitis without obstruction: Secondary | ICD-10-CM | POA: Diagnosis not present

## 2022-11-21 DIAGNOSIS — I69391 Dysphagia following cerebral infarction: Secondary | ICD-10-CM | POA: Diagnosis not present

## 2022-11-21 DIAGNOSIS — E861 Hypovolemia: Secondary | ICD-10-CM | POA: Diagnosis present

## 2022-11-21 DIAGNOSIS — E785 Hyperlipidemia, unspecified: Secondary | ICD-10-CM | POA: Diagnosis present

## 2022-11-21 DIAGNOSIS — R6520 Severe sepsis without septic shock: Secondary | ICD-10-CM | POA: Diagnosis present

## 2022-11-21 DIAGNOSIS — N390 Urinary tract infection, site not specified: Secondary | ICD-10-CM | POA: Diagnosis present

## 2022-11-21 DIAGNOSIS — F329 Major depressive disorder, single episode, unspecified: Secondary | ICD-10-CM | POA: Diagnosis present

## 2022-11-21 DIAGNOSIS — G9341 Metabolic encephalopathy: Secondary | ICD-10-CM | POA: Diagnosis present

## 2022-11-21 DIAGNOSIS — F0153 Vascular dementia, unspecified severity, with mood disturbance: Secondary | ICD-10-CM | POA: Diagnosis present

## 2022-11-21 DIAGNOSIS — E8729 Other acidosis: Secondary | ICD-10-CM

## 2022-11-21 DIAGNOSIS — E1110 Type 2 diabetes mellitus with ketoacidosis without coma: Secondary | ICD-10-CM | POA: Diagnosis present

## 2022-11-21 DIAGNOSIS — K5909 Other constipation: Secondary | ICD-10-CM | POA: Diagnosis present

## 2022-11-21 DIAGNOSIS — K858 Other acute pancreatitis without necrosis or infection: Secondary | ICD-10-CM | POA: Diagnosis present

## 2022-11-21 DIAGNOSIS — I69354 Hemiplegia and hemiparesis following cerebral infarction affecting left non-dominant side: Secondary | ICD-10-CM | POA: Diagnosis not present

## 2022-11-21 DIAGNOSIS — D649 Anemia, unspecified: Secondary | ICD-10-CM | POA: Diagnosis present

## 2022-11-21 DIAGNOSIS — T68XXXA Hypothermia, initial encounter: Secondary | ICD-10-CM

## 2022-11-21 LAB — RAPID URINE DRUG SCREEN, HOSP PERFORMED
Amphetamines: NOT DETECTED
Barbiturates: NOT DETECTED
Benzodiazepines: NOT DETECTED
Cocaine: NOT DETECTED
Opiates: NOT DETECTED
Tetrahydrocannabinol: NOT DETECTED

## 2022-11-21 LAB — BLOOD CULTURE ID PANEL (REFLEXED) - BCID2
A.calcoaceticus-baumannii: NOT DETECTED
Bacteroides fragilis: NOT DETECTED
CTX-M ESBL: NOT DETECTED
Candida albicans: NOT DETECTED
Candida auris: NOT DETECTED
Candida glabrata: NOT DETECTED
Candida krusei: NOT DETECTED
Candida parapsilosis: NOT DETECTED
Candida tropicalis: NOT DETECTED
Carbapenem resist OXA 48 LIKE: NOT DETECTED
Carbapenem resistance IMP: NOT DETECTED
Carbapenem resistance KPC: NOT DETECTED
Carbapenem resistance NDM: NOT DETECTED
Carbapenem resistance VIM: NOT DETECTED
Cryptococcus neoformans/gattii: NOT DETECTED
Enterobacter cloacae complex: NOT DETECTED
Enterobacterales: DETECTED — AB
Enterococcus Faecium: NOT DETECTED
Enterococcus faecalis: NOT DETECTED
Escherichia coli: NOT DETECTED
Haemophilus influenzae: NOT DETECTED
Klebsiella aerogenes: NOT DETECTED
Klebsiella oxytoca: NOT DETECTED
Klebsiella pneumoniae: NOT DETECTED
Listeria monocytogenes: NOT DETECTED
Methicillin resistance mecA/C: NOT DETECTED
Neisseria meningitidis: NOT DETECTED
Proteus species: DETECTED — AB
Pseudomonas aeruginosa: NOT DETECTED
Salmonella species: NOT DETECTED
Serratia marcescens: NOT DETECTED
Staphylococcus aureus (BCID): NOT DETECTED
Staphylococcus epidermidis: DETECTED — AB
Staphylococcus lugdunensis: NOT DETECTED
Staphylococcus species: DETECTED — AB
Stenotrophomonas maltophilia: NOT DETECTED
Streptococcus agalactiae: NOT DETECTED
Streptococcus pneumoniae: NOT DETECTED
Streptococcus pyogenes: NOT DETECTED
Streptococcus species: NOT DETECTED

## 2022-11-21 LAB — MRSA NEXT GEN BY PCR, NASAL: MRSA by PCR Next Gen: DETECTED — AB

## 2022-11-21 LAB — CBC
HCT: 43.8 % (ref 39.0–52.0)
HCT: 45 % (ref 39.0–52.0)
Hemoglobin: 13.1 g/dL (ref 13.0–17.0)
Hemoglobin: 13.3 g/dL (ref 13.0–17.0)
MCH: 28.7 pg (ref 26.0–34.0)
MCH: 29.2 pg (ref 26.0–34.0)
MCHC: 29.1 g/dL — ABNORMAL LOW (ref 30.0–36.0)
MCHC: 30.4 g/dL (ref 30.0–36.0)
MCV: 96.1 fL (ref 80.0–100.0)
MCV: 98.5 fL (ref 80.0–100.0)
Platelets: 134 10*3/uL — ABNORMAL LOW (ref 150–400)
Platelets: 159 10*3/uL (ref 150–400)
RBC: 4.56 MIL/uL (ref 4.22–5.81)
RBC: 4.57 MIL/uL (ref 4.22–5.81)
RDW: 16 % — ABNORMAL HIGH (ref 11.5–15.5)
RDW: 16.1 % — ABNORMAL HIGH (ref 11.5–15.5)
WBC: 14.8 10*3/uL — ABNORMAL HIGH (ref 4.0–10.5)
WBC: 15.8 10*3/uL — ABNORMAL HIGH (ref 4.0–10.5)
nRBC: 0 % (ref 0.0–0.2)
nRBC: 0 % (ref 0.0–0.2)

## 2022-11-21 LAB — GLUCOSE, CAPILLARY
Glucose-Capillary: >600 mg/dL (ref 70–99)
Glucose-Capillary: >600 mg/dL (ref 70–99)
Glucose-Capillary: >600 mg/dL (ref 70–99)
Glucose-Capillary: >600 mg/dL (ref 70–99)
Glucose-Capillary: >600 mg/dL (ref 70–99)
Glucose-Capillary: >600 mg/dL (ref 70–99)
Glucose-Capillary: 598 mg/dL (ref 70–99)
Glucose-Capillary: >600 mg/dL (ref 70–99)
Glucose-Capillary: >600 mg/dL (ref 70–99)
Glucose-Capillary: >600 mg/dL (ref 70–99)
Glucose-Capillary: 576 mg/dL (ref 70–99)
Glucose-Capillary: >600 mg/dL (ref 70–99)
Glucose-Capillary: 534 mg/dL (ref 70–99)
Glucose-Capillary: 547 mg/dL (ref 70–99)
Glucose-Capillary: 552 mg/dL (ref 70–99)
Glucose-Capillary: 489 mg/dL — ABNORMAL HIGH (ref 70–99)
Glucose-Capillary: 443 mg/dL — ABNORMAL HIGH (ref 70–99)
Glucose-Capillary: 479 mg/dL — ABNORMAL HIGH (ref 70–99)
Glucose-Capillary: 377 mg/dL — ABNORMAL HIGH (ref 70–99)
Glucose-Capillary: 408 mg/dL — ABNORMAL HIGH (ref 70–99)
Glucose-Capillary: 301 mg/dL — ABNORMAL HIGH (ref 70–99)
Glucose-Capillary: 159 mg/dL — ABNORMAL HIGH (ref 70–99)
Glucose-Capillary: 106 mg/dL — ABNORMAL HIGH (ref 70–99)
Glucose-Capillary: 312 mg/dL — ABNORMAL HIGH (ref 70–99)
Glucose-Capillary: 293 mg/dL — ABNORMAL HIGH (ref 70–99)
Glucose-Capillary: 270 mg/dL — ABNORMAL HIGH (ref 70–99)

## 2022-11-21 LAB — BASIC METABOLIC PANEL
Anion gap: 18 — ABNORMAL HIGH (ref 5–15)
Anion gap: 12 (ref 5–15)
Anion gap: 12 (ref 5–15)
Anion gap: 13 (ref 5–15)
BUN: 105 mg/dL — ABNORMAL HIGH (ref 8–23)
BUN: 103 mg/dL — ABNORMAL HIGH (ref 8–23)
BUN: 102 mg/dL — ABNORMAL HIGH (ref 8–23)
BUN: 98 mg/dL — ABNORMAL HIGH (ref 8–23)
CO2: 23 mmol/L (ref 22–32)
CO2: 24 mmol/L (ref 22–32)
CO2: 25 mmol/L (ref 22–32)
CO2: 23 mmol/L (ref 22–32)
Calcium: 9 mg/dL (ref 8.9–10.3)
Calcium: 8.6 mg/dL — ABNORMAL LOW (ref 8.9–10.3)
Calcium: 8.9 mg/dL (ref 8.9–10.3)
Calcium: 8.8 mg/dL — ABNORMAL LOW (ref 8.9–10.3)
Chloride: 116 mmol/L — ABNORMAL HIGH (ref 98–111)
Chloride: 119 mmol/L — ABNORMAL HIGH (ref 98–111)
Chloride: 124 mmol/L — ABNORMAL HIGH (ref 98–111)
Chloride: 126 mmol/L — ABNORMAL HIGH (ref 98–111)
Creatinine, Ser: 5.39 mg/dL — ABNORMAL HIGH (ref 0.61–1.24)
Creatinine, Ser: 5.06 mg/dL — ABNORMAL HIGH (ref 0.61–1.24)
Creatinine, Ser: 4.88 mg/dL — ABNORMAL HIGH (ref 0.61–1.24)
Creatinine, Ser: 4.63 mg/dL — ABNORMAL HIGH (ref 0.61–1.24)
GFR, Estimated: 11 mL/min — ABNORMAL LOW (ref >60)
GFR, Estimated: 12 mL/min — ABNORMAL LOW (ref >60)
GFR, Estimated: 13 mL/min — ABNORMAL LOW (ref >60)
GFR, Estimated: 13 mL/min — ABNORMAL LOW (ref >60)
Glucose, Bld: 854 mg/dL (ref 70–99)
Glucose, Bld: 765 mg/dL (ref 70–99)
Glucose, Bld: 483 mg/dL — ABNORMAL HIGH (ref 70–99)
Glucose, Bld: 329 mg/dL — ABNORMAL HIGH (ref 70–99)
Potassium: 5.3 mmol/L — ABNORMAL HIGH (ref 3.5–5.1)
Potassium: 4.9 mmol/L (ref 3.5–5.1)
Potassium: 4.4 mmol/L (ref 3.5–5.1)
Potassium: 4.3 mmol/L (ref 3.5–5.1)
Sodium: 157 mmol/L — ABNORMAL HIGH (ref 135–145)
Sodium: 155 mmol/L — ABNORMAL HIGH (ref 135–145)
Sodium: 161 mmol/L (ref 135–145)
Sodium: 162 mmol/L (ref 135–145)

## 2022-11-21 LAB — PHOSPHORUS: Phosphorus: 7.4 mg/dL — ABNORMAL HIGH (ref 2.5–4.6)

## 2022-11-21 LAB — RESP PANEL BY RT-PCR (RSV, FLU A&B, COVID)  RVPGX2
Influenza A by PCR: NEGATIVE
Influenza B by PCR: NEGATIVE
Resp Syncytial Virus by PCR: NEGATIVE
SARS Coronavirus 2 by RT PCR: NEGATIVE

## 2022-11-21 LAB — BETA-HYDROXYBUTYRIC ACID
Beta-Hydroxybutyric Acid: >8 mmol/L — ABNORMAL HIGH (ref 0.05–0.27)
Beta-Hydroxybutyric Acid: 2.8 mmol/L — ABNORMAL HIGH (ref 0.05–0.27)
Beta-Hydroxybutyric Acid: 0.08 mmol/L (ref 0.05–0.27)

## 2022-11-21 LAB — CBG MONITORING, ED
Glucose-Capillary: >600 mg/dL (ref 70–99)
Glucose-Capillary: >600 mg/dL (ref 70–99)
Glucose-Capillary: >600 mg/dL (ref 70–99)
Glucose-Capillary: >600 mg/dL (ref 70–99)
Glucose-Capillary: >600 mg/dL (ref 70–99)
Glucose-Capillary: >600 mg/dL (ref 70–99)

## 2022-11-21 LAB — CORTISOL: Cortisol, Plasma: 87 ug/dL

## 2022-11-21 LAB — AMMONIA: Ammonia: 25 umol/L (ref 9–35)

## 2022-11-21 LAB — OSMOLALITY: Osmolality: 443 mOsm/kg (ref 275–295)

## 2022-11-21 LAB — ETHANOL: Alcohol, Ethyl (B): <10 mg/dL (ref <10)

## 2022-11-21 LAB — TSH: TSH: 0.957 u[IU]/mL (ref 0.350–4.500)

## 2022-11-21 LAB — CK: Total CK: 230 U/L (ref 49–397)

## 2022-11-21 LAB — HIV ANTIBODY (ROUTINE TESTING W REFLEX): HIV Screen 4th Generation wRfx: NONREACTIVE

## 2022-11-21 LAB — LIPASE, BLOOD: Lipase: 1092 U/L — ABNORMAL HIGH (ref 11–51)

## 2022-11-21 LAB — LACTIC ACID, PLASMA
Lactic Acid, Venous: 2.6 mmol/L (ref 0.5–1.9)
Lactic Acid, Venous: 2.3 mmol/L (ref 0.5–1.9)

## 2022-11-21 LAB — MAGNESIUM
Magnesium: 3.6 mg/dL — ABNORMAL HIGH (ref 1.7–2.4)
Magnesium: 3.5 mg/dL — ABNORMAL HIGH (ref 1.7–2.4)

## 2022-11-21 LAB — TROPONIN I (HIGH SENSITIVITY): Troponin I (High Sensitivity): 24 ng/L — ABNORMAL HIGH (ref <18)

## 2022-11-21 MED ORDER — INSULIN ASPART 100 UNIT/ML IV SOLN
10.0000 [IU] | Freq: Once | INTRAVENOUS | Status: AC
  Administered 2022-11-21: 10 [IU] via INTRAVENOUS

## 2022-11-21 MED ORDER — CALCIUM GLUCONATE-NACL 1-0.675 GM/50ML-% IV SOLN
1.0000 g | Freq: Once | INTRAVENOUS | Status: AC
  Administered 2022-11-21: 1000 mg via INTRAVENOUS
  Filled 2022-11-21: qty 50

## 2022-11-21 MED ORDER — METRONIDAZOLE 500 MG/100ML IV SOLN
500.0000 mg | Freq: Two times a day (BID) | INTRAVENOUS | Status: DC
  Administered 2022-11-21 – 2022-11-22 (×3): 500 mg via INTRAVENOUS
  Filled 2022-11-21 (×3): qty 100

## 2022-11-21 MED ORDER — MUPIROCIN 2 % EX OINT
1.0000 | TOPICAL_OINTMENT | Freq: Two times a day (BID) | CUTANEOUS | Status: AC
  Administered 2022-11-21 – 2022-11-25 (×10): 1 via NASAL
  Filled 2022-11-21 (×3): qty 22

## 2022-11-21 MED ORDER — LACTATED RINGERS IV BOLUS
2500.0000 mL | Freq: Once | INTRAVENOUS | Status: AC
  Administered 2022-11-21: 2500 mL via INTRAVENOUS

## 2022-11-21 MED ORDER — LACTATED RINGERS IV BOLUS
1000.0000 mL | Freq: Once | INTRAVENOUS | Status: AC
  Administered 2022-11-21: 1000 mL via INTRAVENOUS

## 2022-11-21 MED ORDER — ORAL CARE MOUTH RINSE: 15.0000 mL | OROMUCOSAL | Status: DC | PRN

## 2022-11-21 MED ORDER — HYDRALAZINE HCL 20 MG/ML IJ SOLN
10.0000 mg | INTRAMUSCULAR | Status: DC | PRN
  Administered 2022-12-01: 20 mg via INTRAVENOUS
  Filled 2022-11-21: qty 1

## 2022-11-21 MED ORDER — METOPROLOL TARTRATE 5 MG/5ML IV SOLN
2.5000 mg | INTRAVENOUS | Status: DC | PRN
  Administered 2022-11-21: 5 mg via INTRAVENOUS
  Administered 2022-11-21: 2.5 mg via INTRAVENOUS
  Filled 2022-11-21 (×2): qty 5

## 2022-11-21 MED ORDER — DEXTROSE 5 % IV SOLN: INTRAVENOUS | Status: DC

## 2022-11-21 MED ORDER — DOCUSATE SODIUM 100 MG PO CAPS: 100.0000 mg | ORAL_CAPSULE | Freq: Two times a day (BID) | ORAL | Status: DC | PRN

## 2022-11-21 MED ORDER — DEXTROSE 50 % IV SOLN
0.0000 mL | INTRAVENOUS | Status: DC | PRN
  Administered 2022-11-29: 50 mL via INTRAVENOUS
  Filled 2022-11-21: qty 50

## 2022-11-21 MED ORDER — POLYETHYLENE GLYCOL 3350 17 G PO PACK
17.0000 g | PACK | Freq: Every day | ORAL | Status: DC | PRN
  Filled 2022-11-21: qty 1

## 2022-11-21 MED ORDER — FREE WATER: 100.0000 mL | Status: DC

## 2022-11-21 MED ORDER — INSULIN REGULAR(HUMAN) IN NACL 100-0.9 UT/100ML-% IV SOLN
INTRAVENOUS | Status: AC
  Administered 2022-11-21: 12 [IU]/h via INTRAVENOUS
  Administered 2022-11-22: 9.5 [IU]/h via INTRAVENOUS
  Administered 2022-11-22: 7 [IU]/h via INTRAVENOUS
  Administered 2022-11-23: 13 [IU]/h via INTRAVENOUS
  Administered 2022-11-23: 8 [IU]/h via INTRAVENOUS
  Administered 2022-11-24 (×2): 13 [IU]/h via INTRAVENOUS
  Filled 2022-11-21 (×7): qty 100

## 2022-11-21 MED ORDER — SODIUM BICARBONATE 8.4 % IV SOLN
100.0000 meq | Freq: Once | INTRAVENOUS | Status: AC
  Administered 2022-11-21: 100 meq via INTRAVENOUS
  Filled 2022-11-21: qty 50

## 2022-11-21 MED ORDER — DEXTROSE IN LACTATED RINGERS 5 % IV SOLN: INTRAVENOUS | Status: DC

## 2022-11-21 MED ORDER — LACTATED RINGERS IV SOLN: INTRAVENOUS | Status: DC

## 2022-11-21 MED ORDER — DEXTROSE 50 % IV SOLN: 25.0000 mL | Freq: Once | INTRAVENOUS | Status: DC

## 2022-11-21 MED ORDER — PANTOPRAZOLE SODIUM 40 MG IV SOLR
40.0000 mg | Freq: Every day | INTRAVENOUS | Status: DC
  Administered 2022-11-21 – 2022-11-26 (×6): 40 mg via INTRAVENOUS
  Filled 2022-11-21 (×6): qty 10

## 2022-11-21 MED ORDER — HEPARIN SODIUM (PORCINE) 5000 UNIT/ML IJ SOLN
5000.0000 [IU] | Freq: Three times a day (TID) | INTRAMUSCULAR | Status: DC
  Administered 2022-11-21 – 2022-11-26 (×15): 5000 [IU] via SUBCUTANEOUS
  Filled 2022-11-21 (×14): qty 1

## 2022-11-21 MED ORDER — CHLORHEXIDINE GLUCONATE CLOTH 2 % EX PADS
6.0000 | MEDICATED_PAD | CUTANEOUS | Status: DC
  Administered 2022-11-21 – 2022-11-28 (×9): 6 via TOPICAL

## 2022-11-21 NOTE — Progress Notes (Signed)
Dr. Lamonte Sakai notified of afib with HR 130s-150s and SBP 89- low 100s after metoprolol give during bedside rounding.

## 2022-11-21 NOTE — Progress Notes (Signed)
CRITICAL VALUE STICKER  CRITICAL VALUE: Sodium 161  RECEIVER (on-site recipient of call):  Freda Jackson RN  DATE & TIME NOTIFIED: 11/21/22 @ 1643  MD NOTIFIED: Dr. Lamonte Sakai  TIME OF NOTIFICATION: 11/21/22 @ 1646  RESPONSE:  see orders

## 2022-11-21 NOTE — Progress Notes (Signed)
Lab called with corrected results:  Lipase collected 11/21/22 @ 0012 original value 1,160 and the corrected value is 1,092

## 2022-11-21 NOTE — Inpatient Diabetes Management (Signed)
Inpatient Diabetes Program Recommendations  AACE/ADA: New Consensus Statement on Inpatient Glycemic Control (2015)  Target Ranges:  Prepandial:   less than 140 mg/dL      Peak postprandial:   less than 180 mg/dL (1-2 hours)      Critically ill patients:  140 - 180 mg/dL    Latest Reference Range & Units 11/20/22 19:50 11/21/22 00:12 11/21/22 06:35  Beta-Hydroxybutyric Acid 0.05 - 0.27 mmol/L >8.00 (H) >8.00 (H) 2.80 (H)  (H): Data is abnormally high  Latest Reference Range & Units 11/20/22 19:50  Sodium 135 - 145 mmol/L 153 (H)  Potassium 3.5 - 5.1 mmol/L 6.1 (H)  Chloride 98 - 111 mmol/L 110  CO2 22 - 32 mmol/L 14 (L)  Glucose 70 - 99 mg/dL 1,113 (HH)  BUN 8 - 23 mg/dL 101 (H)  Creatinine 0.61 - 1.24 mg/dL 5.52 (H)  Calcium 8.9 - 10.3 mg/dL 9.2  Anion gap 5 - 15  29 (H)  (HH): Data is critically high (H): Data is abnormally high (L): Data is abnormally low  Latest Reference Range & Units 11/20/22 20:19 11/20/22 23:15 11/21/22 00:04 11/21/22 00:51 11/21/22 02:01 11/21/22 02:45 11/21/22 03:51 11/21/22 04:22  Glucose-Capillary 70 - 99 mg/dL >600 (HH) >600 (HH)  IV Insulin Drip Started >600 (HH) >600 (HH) >600 (HH) >600 (HH) >600 (HH) >600 (HH)  ([place]): Data is critically high  Latest Reference Range & Units 11/21/22 06:55 11/21/22 07:30 11/21/22 08:01 11/21/22 08:32 11/21/22 09:04 11/21/22 09:33 11/21/22 10:02  Glucose-Capillary 70 - 99 mg/dL >600 (HH)  IV Insulin Infusing >600 (HH) >600 (HH) >600 (HH) >600 (HH) >600 (HH) 598 (HH)  ([place]): Data is critically high  Latest Reference Range & Units 11/21/22 10:29 11/21/22 10:57 11/21/22 11:32 11/21/22 11:53 11/21/22 12:32 11/21/22 13:00 11/21/22 13:29  Glucose-Capillary 70 - 99 mg/dL >600 (HH)  IV Insulin Infusing >600 (HH) >600 (HH) 576 (HH) 552 (HH) 547 (HH) 534 (HH)  (HH): Data is critically high   Admit with:  DKA Hyperkalemia in setting of DKA AKI from hypovolemia in setting of DKA Hypernatremia Possible sepsis Acute  metabolic encephalopathy New onset A fib  History: DM, CVA  Home DM Meds: None  Current Orders: IV Insulin Drip    Current A1c Pending  No DM meds at home  Will follow    --Will follow patient during hospitalization--  Wyn Quaker RN, MSN, [place] Diabetes Coordinator Inpatient Glycemic Control Team Team Pager: [phone] (8a-5p)

## 2022-11-21 NOTE — Progress Notes (Signed)
eLink Physician-Brief Progress Note Patient Name: [name] [name]. DOB: 1961-03-01 MRN: [id number]   Date of Service  11/21/2022  HPI/Events of Note  Na 162, started D5W 4 hours ago at 125 cc/hr for a 161 Na earlier Corrected sodium from 167 down to 165  eICU Interventions  Appropriate response BMP monitored q 4 Discussed with BSRN     Intervention Category Intermediate Interventions: Electrolyte abnormality - evaluation and management  Judd Lien 11/21/2022, 9:21 PM

## 2022-11-21 NOTE — Progress Notes (Signed)
CRITICAL VALUE STICKER   CRITICAL VALUE: Sodium 162   RECEIVER (on-site recipient of call): Rick Duff RN   [place] NOTIFIED: 11/21/2022 - 2027   MD NOTIFIED: Dr.[place] - E-Link   TIME OF NOTIFICATION: 11/21/22 @ 2027   RESPONSE:  Evaluate Remotely

## 2022-11-21 NOTE — Progress Notes (Signed)
CRITICAL VALUE STICKER  CRITICAL VALUE: Glucose 765  RECEIVER (on-site recipient of call):  Freda Jackson RN  DATE & TIME NOTIFIED: 11/21/22 @ 1205  Glucose lab values decreasing.  Patient on insulin gtt already,.

## 2022-11-21 NOTE — ED Notes (Signed)
Difficulty picking up a good reading for pulse ox due to extremities being cold. Patient place on [place] for precaution

## 2022-11-21 NOTE — Progress Notes (Signed)
Patient has converted out of Afib to NSR with HR 79

## 2022-11-21 NOTE — Progress Notes (Signed)
PHARMACY - PHYSICIAN COMMUNICATION CRITICAL VALUE ALERT - BLOOD CULTURE IDENTIFICATION (BCID)  [name] [name]. is an 62 y.o. male who presented to [facility] on 11/20/2022 found down with DKA and possible sepsis (?urinary source)  Assessment:  1/3 Bcx: staphylococcus epidermidis + proteus species - no resistance detected.   Name of physician (or Provider) Contacted: [place]  Current antibiotics: cefepime, flagyl, vancomycin   Changes to prescribed antibiotics recommended:  No change to abx regimen - appropriately covering discovered organisms with additional culture results pending.   Results for orders placed or performed during the hospital encounter of 11/20/22  Blood Culture ID Panel (Reflexed) (Collected: 11/20/2022  8:00 PM)  Result Value Ref Range   Enterococcus faecalis NOT DETECTED NOT DETECTED   Enterococcus Faecium NOT DETECTED NOT DETECTED   Listeria monocytogenes NOT DETECTED NOT DETECTED   Staphylococcus species DETECTED (A) NOT DETECTED   Staphylococcus aureus (BCID) NOT DETECTED NOT DETECTED   Staphylococcus epidermidis DETECTED (A) NOT DETECTED   Staphylococcus lugdunensis NOT DETECTED NOT DETECTED   Streptococcus species NOT DETECTED NOT DETECTED   Streptococcus agalactiae NOT DETECTED NOT DETECTED   Streptococcus pneumoniae NOT DETECTED NOT DETECTED   Streptococcus pyogenes NOT DETECTED NOT DETECTED   A.calcoaceticus-baumannii NOT DETECTED NOT DETECTED   Bacteroides fragilis NOT DETECTED NOT DETECTED   Enterobacterales DETECTED (A) NOT DETECTED   Enterobacter cloacae complex NOT DETECTED NOT DETECTED   Escherichia coli NOT DETECTED NOT DETECTED   Klebsiella aerogenes NOT DETECTED NOT DETECTED   Klebsiella oxytoca NOT DETECTED NOT DETECTED   Klebsiella pneumoniae NOT DETECTED NOT DETECTED   Proteus species DETECTED (A) NOT DETECTED   Salmonella species NOT DETECTED NOT DETECTED   Serratia marcescens NOT DETECTED NOT DETECTED   Haemophilus influenzae NOT  DETECTED NOT DETECTED   Neisseria meningitidis NOT DETECTED NOT DETECTED   Pseudomonas aeruginosa NOT DETECTED NOT DETECTED   Stenotrophomonas maltophilia NOT DETECTED NOT DETECTED   Candida albicans NOT DETECTED NOT DETECTED   Candida auris NOT DETECTED NOT DETECTED   Candida glabrata NOT DETECTED NOT DETECTED   Candida krusei NOT DETECTED NOT DETECTED   Candida parapsilosis NOT DETECTED NOT DETECTED   Candida tropicalis NOT DETECTED NOT DETECTED   Cryptococcus neoformans/gattii NOT DETECTED NOT DETECTED   CTX-M ESBL NOT DETECTED NOT DETECTED   Carbapenem resistance IMP NOT DETECTED NOT DETECTED   Carbapenem resistance KPC NOT DETECTED NOT DETECTED   Methicillin resistance mecA/C NOT DETECTED NOT DETECTED   Carbapenem resistance NDM NOT DETECTED NOT DETECTED   Carbapenem resist OXA 48 LIKE NOT DETECTED NOT DETECTED   Carbapenem resistance VIM NOT DETECTED NOT DETECTED    Wilson Singer, PharmD Clinical Pharmacist 11/21/2022 5:25 PM

## 2022-11-21 NOTE — Progress Notes (Signed)
CRITICAL VALUE STICKER  CRITICAL VALUE: Glucose 854  RECEIVER (on-site recipient of call): Freda Jackson RN  DATE & TIME NOTIFIED: 11/21/22 @ 0709  Patient on insulin gtt

## 2022-11-21 NOTE — Progress Notes (Signed)
eLink Physician-Brief Progress Note Patient Name: [name] [name]. DOB: 11/02/1961 MRN: [id number]   Date of Service  11/21/2022  HPI/Events of Note  62 yr old male with DM found down at home.  Hypotensive and hypothermic.  Eval revealed severe hyperglycemia with acidemia, acute renal failure, hypernatremia, elevated lipase, and uti.  Now admitted to ICU. On abx, fluids, DKA protocol.  eICU Interventions  Chart reviewed     Intervention Category Evaluation Type: New Patient Evaluation  Mauri [place], P 11/21/2022, 6:54 AM

## 2022-11-22 ENCOUNTER — Inpatient Hospital Stay (HOSPITAL_COMMUNITY): Payer: Medicare (Managed Care)

## 2022-11-22 ENCOUNTER — Other Ambulatory Visit (HOSPITAL_COMMUNITY): Payer: Medicare (Managed Care)

## 2022-11-22 ENCOUNTER — Encounter: Payer: Self-pay | Admitting: Adult Health

## 2022-11-22 ENCOUNTER — Encounter: Payer: Medicare (Managed Care) | Admitting: Adult Health

## 2022-11-22 DIAGNOSIS — G9341 Metabolic encephalopathy: Secondary | ICD-10-CM | POA: Diagnosis not present

## 2022-11-22 DIAGNOSIS — R6520 Severe sepsis without septic shock: Secondary | ICD-10-CM

## 2022-11-22 DIAGNOSIS — A419 Sepsis, unspecified organism: Secondary | ICD-10-CM | POA: Diagnosis not present

## 2022-11-22 LAB — COMPREHENSIVE METABOLIC PANEL
ALT: 19 U/L (ref 0–44)
AST: 13 U/L — ABNORMAL LOW (ref 15–41)
Albumin: 2.5 g/dL — ABNORMAL LOW (ref 3.5–5.0)
Alkaline Phosphatase: 124 U/L (ref 38–126)
Anion gap: 27 — ABNORMAL HIGH (ref 5–15)
BUN: 106 mg/dL — ABNORMAL HIGH (ref 8–23)
CO2: 12 mmol/L — ABNORMAL LOW (ref 22–32)
Calcium: 8.7 mg/dL — ABNORMAL LOW (ref 8.9–10.3)
Chloride: 113 mmol/L — ABNORMAL HIGH (ref 98–111)
Creatinine, Ser: 5.73 mg/dL — ABNORMAL HIGH (ref 0.61–1.24)
GFR, Estimated: 10 mL/min — ABNORMAL LOW (ref >60)
Glucose, Bld: 1077 mg/dL (ref 70–99)
Potassium: 6.2 mmol/L — ABNORMAL HIGH (ref 3.5–5.1)
Sodium: 152 mmol/L — ABNORMAL HIGH (ref 135–145)
Total Bilirubin: 1.5 mg/dL — ABNORMAL HIGH (ref 0.3–1.2)
Total Protein: 6.7 g/dL (ref 6.5–8.1)

## 2022-11-22 LAB — HEPATIC FUNCTION PANEL
ALT: 21 U/L (ref 0–44)
AST: 47 U/L — ABNORMAL HIGH (ref 15–41)
Albumin: 2.3 g/dL — ABNORMAL LOW (ref 3.5–5.0)
Alkaline Phosphatase: 113 U/L (ref 38–126)
Bilirubin, Direct: 0.1 mg/dL (ref 0.0–0.2)
Indirect Bilirubin: 0.4 mg/dL (ref 0.3–0.9)
Total Bilirubin: 0.5 mg/dL (ref 0.3–1.2)
Total Protein: 6.2 g/dL — ABNORMAL LOW (ref 6.5–8.1)

## 2022-11-22 LAB — BASIC METABOLIC PANEL
Anion gap: 10 (ref 5–15)
Anion gap: 11 (ref 5–15)
Anion gap: 12 (ref 5–15)
Anion gap: 12 (ref 5–15)
Anion gap: 13 (ref 5–15)
Anion gap: 28 — ABNORMAL HIGH (ref 5–15)
BUN: 104 mg/dL — ABNORMAL HIGH (ref 8–23)
BUN: 80 mg/dL — ABNORMAL HIGH (ref 8–23)
BUN: 83 mg/dL — ABNORMAL HIGH (ref 8–23)
BUN: 90 mg/dL — ABNORMAL HIGH (ref 8–23)
BUN: 92 mg/dL — ABNORMAL HIGH (ref 8–23)
BUN: 97 mg/dL — ABNORMAL HIGH (ref 8–23)
CO2: 12 mmol/L — ABNORMAL LOW (ref 22–32)
CO2: 23 mmol/L (ref 22–32)
CO2: 24 mmol/L (ref 22–32)
CO2: 25 mmol/L (ref 22–32)
CO2: 25 mmol/L (ref 22–32)
CO2: 26 mmol/L (ref 22–32)
Calcium: 8.4 mg/dL — ABNORMAL LOW (ref 8.9–10.3)
Calcium: 8.5 mg/dL — ABNORMAL LOW (ref 8.9–10.3)
Calcium: 8.5 mg/dL — ABNORMAL LOW (ref 8.9–10.3)
Calcium: 8.6 mg/dL — ABNORMAL LOW (ref 8.9–10.3)
Calcium: 8.8 mg/dL — ABNORMAL LOW (ref 8.9–10.3)
Calcium: 8.8 mg/dL — ABNORMAL LOW (ref 8.9–10.3)
Chloride: 111 mmol/L (ref 98–111)
Chloride: 122 mmol/L — ABNORMAL HIGH (ref 98–111)
Chloride: 125 mmol/L — ABNORMAL HIGH (ref 98–111)
Chloride: 126 mmol/L — ABNORMAL HIGH (ref 98–111)
Chloride: 126 mmol/L — ABNORMAL HIGH (ref 98–111)
Chloride: 129 mmol/L — ABNORMAL HIGH (ref 98–111)
Creatinine, Ser: 2.75 mg/dL — ABNORMAL HIGH (ref 0.61–1.24)
Creatinine, Ser: 3.36 mg/dL — ABNORMAL HIGH (ref 0.61–1.24)
Creatinine, Ser: 3.69 mg/dL — ABNORMAL HIGH (ref 0.61–1.24)
Creatinine, Ser: 3.87 mg/dL — ABNORMAL HIGH (ref 0.61–1.24)
Creatinine, Ser: 4.43 mg/dL — ABNORMAL HIGH (ref 0.61–1.24)
Creatinine, Ser: 5.65 mg/dL — ABNORMAL HIGH (ref 0.61–1.24)
GFR, Estimated: 11 mL/min — ABNORMAL LOW (ref >60)
GFR, Estimated: 14 mL/min — ABNORMAL LOW (ref >60)
GFR, Estimated: 17 mL/min — ABNORMAL LOW (ref >60)
GFR, Estimated: 18 mL/min — ABNORMAL LOW (ref >60)
GFR, Estimated: 20 mL/min — ABNORMAL LOW (ref >60)
GFR, Estimated: 25 mL/min — ABNORMAL LOW (ref >60)
Glucose, Bld: 1130 mg/dL (ref 70–99)
Glucose, Bld: 181 mg/dL — ABNORMAL HIGH (ref 70–99)
Glucose, Bld: 218 mg/dL — ABNORMAL HIGH (ref 70–99)
Glucose, Bld: 231 mg/dL — ABNORMAL HIGH (ref 70–99)
Glucose, Bld: 255 mg/dL — ABNORMAL HIGH (ref 70–99)
Glucose, Bld: 302 mg/dL — ABNORMAL HIGH (ref 70–99)
Potassium: 3.4 mmol/L — ABNORMAL LOW (ref 3.5–5.1)
Potassium: 3.4 mmol/L — ABNORMAL LOW (ref 3.5–5.1)
Potassium: 3.5 mmol/L (ref 3.5–5.1)
Potassium: 3.5 mmol/L (ref 3.5–5.1)
Potassium: 3.7 mmol/L (ref 3.5–5.1)
Potassium: 6.8 mmol/L (ref 3.5–5.1)
Sodium: 151 mmol/L — ABNORMAL HIGH (ref 135–145)
Sodium: 159 mmol/L — ABNORMAL HIGH (ref 135–145)
Sodium: 162 mmol/L (ref 135–145)
Sodium: 162 mmol/L (ref 135–145)
Sodium: 163 mmol/L (ref 135–145)
Sodium: 163 mmol/L (ref 135–145)

## 2022-11-22 LAB — HEMOGLOBIN A1C
Hgb A1c MFr Bld: 13.5 % — ABNORMAL HIGH (ref 4.8–5.6)
Mean Plasma Glucose: 341 mg/dL

## 2022-11-22 LAB — GLUCOSE, CAPILLARY
Glucose-Capillary: 229 mg/dL — ABNORMAL HIGH (ref 70–99)
Glucose-Capillary: 246 mg/dL — ABNORMAL HIGH (ref 70–99)
Glucose-Capillary: 200 mg/dL — ABNORMAL HIGH (ref 70–99)
Glucose-Capillary: 202 mg/dL — ABNORMAL HIGH (ref 70–99)
Glucose-Capillary: 181 mg/dL — ABNORMAL HIGH (ref 70–99)
Glucose-Capillary: 190 mg/dL — ABNORMAL HIGH (ref 70–99)
Glucose-Capillary: 208 mg/dL — ABNORMAL HIGH (ref 70–99)
Glucose-Capillary: 185 mg/dL — ABNORMAL HIGH (ref 70–99)
Glucose-Capillary: 181 mg/dL — ABNORMAL HIGH (ref 70–99)
Glucose-Capillary: 178 mg/dL — ABNORMAL HIGH (ref 70–99)
Glucose-Capillary: 145 mg/dL — ABNORMAL HIGH (ref 70–99)
Glucose-Capillary: 218 mg/dL — ABNORMAL HIGH (ref 70–99)
Glucose-Capillary: 170 mg/dL — ABNORMAL HIGH (ref 70–99)
Glucose-Capillary: 198 mg/dL — ABNORMAL HIGH (ref 70–99)
Glucose-Capillary: 191 mg/dL — ABNORMAL HIGH (ref 70–99)
Glucose-Capillary: 192 mg/dL — ABNORMAL HIGH (ref 70–99)
Glucose-Capillary: 142 mg/dL — ABNORMAL HIGH (ref 70–99)

## 2022-11-22 LAB — LIPASE, BLOOD: Lipase: 319 U/L — ABNORMAL HIGH (ref 11–51)

## 2022-11-22 LAB — MAGNESIUM: Magnesium: 2.8 mg/dL — ABNORMAL HIGH (ref 1.7–2.4)

## 2022-11-22 LAB — VANCOMYCIN, RANDOM: Vancomycin Rm: 15 ug/mL

## 2022-11-22 LAB — PHOSPHORUS: Phosphorus: 2.5 mg/dL (ref 2.5–4.6)

## 2022-11-22 MED ORDER — VITAL 1.5 CAL PO LIQD
1000.0000 mL | ORAL | Status: DC
  Administered 2022-11-22 – 2022-11-24 (×4): 1000 mL

## 2022-11-22 MED ORDER — POTASSIUM PHOSPHATES 15 MMOLE/5ML IV SOLN
15.0000 mmol | Freq: Once | INTRAVENOUS | Status: AC
  Administered 2022-11-22: 15 mmol via INTRAVENOUS
  Filled 2022-11-22: qty 5

## 2022-11-22 MED ORDER — FREE WATER
300.0000 mL | Status: DC
  Administered 2022-11-22 – 2022-11-24 (×13): 300 mL

## 2022-11-22 MED ORDER — WHITE PETROLATUM EX OINT
TOPICAL_OINTMENT | CUTANEOUS | Status: DC | PRN
  Filled 2022-11-22: qty 28.35

## 2022-11-22 MED ORDER — PROSOURCE TF20 ENFIT COMPATIBL EN LIQD
60.0000 mL | Freq: Every day | ENTERAL | Status: DC
  Administered 2022-11-22 – 2022-11-24 (×3): 60 mL
  Filled 2022-11-22 (×3): qty 60

## 2022-11-22 MED ORDER — MORPHINE SULFATE (PF) 2 MG/ML IV SOLN
1.0000 mg | INTRAVENOUS | Status: AC | PRN
  Administered 2022-11-22 (×2): 1 mg via INTRAVENOUS
  Filled 2022-11-22 (×2): qty 1

## 2022-11-22 MED ORDER — FREE WATER
100.0000 mL | Status: DC
  Administered 2022-11-22: 100 mL

## 2022-11-22 NOTE — Progress Notes (Signed)
eLink Physician-Brief Progress Note Patient Name: [name] [name]. DOB: Nov 22, 1960 MRN: [id number]   Date of Service  11/22/2022  HPI/Events of Note  Patient becoming more restless/agitated, says yes to pain questions. Won't answer A&O questions but seems to be more reponsive than previous.  eICU Interventions  Trial of morphine 1 mg IV prn pain Discussed with bedside RN     Intervention Category Intermediate Interventions: Other:  Judd Lien 11/22/2022, 3:28 AM

## 2022-11-22 NOTE — Progress Notes (Signed)
NAME:  [name] [name]., MRN:  [id number], DOB:  [birth year], LOS: 1 ADMISSION DATE:  11/20/2022, CONSULTATION DATE:  1/13 REFERRING MD:  Dr. Nechama Guard, CHIEF COMPLAINT:  DKA   History of Present Illness:  Patient is a 62 year old male with pertinent PMH of DMT2 controlled by diet, HTN, HLD, previous R CVA with left-sided deficits presents to [facility] ED on 1/13 with AMS.  Per patient's sister patient has been lethargic over the past week.  Patient uses walker at home and had a fall on 1/9.  Patient stated that he was having constipation and was taking Dulcolax suppositories.  Per patient's sister patient has been having multiple episodes of diarrhea over the past week.  States that he was more alert on 1/12 but noticed that he was more sleepy compared to normal.  On 1/13 patient found on floor with confusion.  EMS called and transferred patient to [facility].  Upon arrival to [facility] ED, patient tachycardia 140s found to be in A-fib RVR.  Glucose 1100, AG 29, CO2 14, pH 7.30, beta H > 8.  Patient started on insulin per Endo tool and IV fluids given. Patient confused. Patient temp 94.9 F and WBC 16.  LA 3.2.  Cultures obtained.  Started on broad-spectrum antibiotics.  CXR with no significant findings.  PCCM consulted for ICU admission.  Pertinent ED labs: NA 153, K6.1, creat 5.5, Trop 20  Pending: resp panel, ua/uc, bcx2  Pertinent  Medical History   Past Medical History:  Diagnosis Date   Acute respiratory failure (HCC)    Cerebral edema (HCC)    Diabetes mellitus without complication ([place])    diet controlled- no meds   Dysphagia as late effect of cerebrovascular disease    Fall at home    Hemiparesis affecting left side as late effect of stroke ([place])    Hyperlipidemia    Hypertension    Stroke ([place]) 2016   Tuberculosis    11 yrs ago per pt.- treated with meds per pt.    Significant Hospital Events: Including procedures, antibiotic start and stop dates in addition to other pertinent events   1/13  admitted to [facility] encephalopathic; DKA; PCCM consulted   Interim History / Subjective:   More awake today.  Able to answer simple questions Remains on insulin drip  Objective   Blood pressure (!) 128/97, pulse 87, temperature (!) 97.5 F (36.4 C), resp. rate 18, weight 114 kg, SpO2 90 %.        Intake/Output Summary (Last 24 hours) at 11/22/2022 0754 Last data filed at 11/22/2022 0700 Gross per 24 hour  Intake 3705.08 ml  Output 1350 ml  Net 2355.08 ml   Filed Weights   11/20/22 2251 11/21/22 0621 11/22/22 0500  Weight: 125.2 kg 114 kg 114 kg    Examination: Blood pressure (!) 128/97, pulse 87, temperature (!) 97.5 F (36.4 C), resp. rate 18, weight 114 kg, SpO2 90 %. Gen:      No acute distress, chronically ill-appearing HEENT:  EOMI, sclera anicteric Neck:     No masses; no thyromegaly Lungs:    Clear to auscultation bilaterally; normal respiratory effort CV:         Regular rate and rhythm; no murmurs Abd:      Mild epigastric tenderness.  No guarding or rigidity Ext:    No edema; adequate peripheral perfusion Skin:      Warm and dry; no rash Neuro: Garbled speech, left arm contracture, left hemiparesis  Labs/imaging reviewed Significant for  BUN/creatinine 92/2.87, anion gap 10 WBC 14.8, hemoglobin 13.3, platelets 134 Blood culture with staph, Proteus bacteremia  Resolved Hospital Problem list     Assessment & Plan:  Severe sepsis, present on admission Likely secondary to UTI, Proteus bacteremia Staph epidermidis is likely contaminant Narrow antibiotics to cefepime Follow cultures  DKA Continue on insulin drip.  Transition to subcu insulin per protocol Continue IV fluid hydration Follow hemoglobin A1c  Acute encephalopathy: likely due to above with component of sepsis Hx of R CVA: w/ left sided hemiparesis CT head no acute abnormality; remote R mca infarct w/ vascular stent in right M1 segment Limit sedation medications.  Follow mental status Delirium  precautions Ammonia and TSH levels are normal Urine drug screen is normal  Acute pancreatitis May have passed a biliary stone.  Will need to get EtOH history Recheck hepatic panel, lactic acid IV fluids  Hypernatremia, AKI Continue D5, free water replacement vent core track was placed today   New onset Afib RVR: likely in setting of hypovolemia and sepsis Telemetry monitoring   Prolonged qtc -telemetry monitoring -check mag -avoid qtc prolonging agents  HTN HLD Hold anti-hypertensives/statin/ASA while npo prn iv hydralazine for HTN  GERD PPI  MDD hold fluoxetine given prolonged qtc  Best Practice (right click and "Reselect all SmartList Selections" daily)   Diet/type: NPO DVT prophylaxis: prophylactic heparin  GI prophylaxis: PPI Lines: Central line Foley:  Yes, and it is still needed Code Status:  full code Last date of multidisciplinary goals of care discussion [updated sister Hassan Rowan on plan of care]  Critical care time:    The patient is critically ill with multiple organ system failure and requires high complexity decision making for assessment and support, frequent evaluation and titration of therapies, advanced monitoring, review of radiographic studies and interpretation of complex data.   Critical Care Time devoted to patient care services, exclusive of separately billable procedures, described in this note is 45 minutes.   Marshell Garfinkel MD [facility] See Amion for pager  If no response to pager , please call [phone] until 7pm After 7:00 pm call Elink  [phone] 11/22/2022, 7:56 AM

## 2022-11-22 NOTE — Progress Notes (Signed)
This encounter was created in error - please disregard.

## 2022-11-22 NOTE — Progress Notes (Signed)
eLink Physician-Brief Progress Note Patient Name: [name] [name]. DOB: 1961-02-04 MRN: [id number]   Date of Service  11/22/2022  HPI/Events of Note  Corrected sodium q 4 167 to 165 to 165 to 164 On D5W 125 cc/hr  eICU Interventions  Added free water flushes 200 q 4     Intervention Category Intermediate Interventions: Electrolyte abnormality - evaluation and management  Judd Lien 11/22/2022, 4:50 AM

## 2022-11-22 NOTE — Progress Notes (Signed)
Unable to use Lt arm for IV access due to contracture from stroke - Rt arm examined using Ultrasound - attempted 1 stick Rt lower FA - unable to thread. Nothing else visualized for another attempt. RN aware that IV team unable to provide the 2 IV's ICU wanted so they can D/C the femoral central line.

## 2022-11-22 NOTE — Progress Notes (Signed)
CRITICAL VALUE STICKER   CRITICAL VALUE: Sodium 163   RECEIVER (on-site recipient of call): Rick Duff RN   [place] NOTIFIED: 11/22/2022 - 0425   MD NOTIFIED: Dr.A[place]ura - E-Link   TIME OF NOTIFICATION: 11/22/22 @ 0428   RESPONSE:  Evaluate Remotely

## 2022-11-22 NOTE — Procedures (Signed)
Cortrak  Person Inserting Tube:  Maylon Peppers C, RD Tube Type:  Cortrak - 43 inches Tube Size:  10 Tube Location:  Left nare Secured by: Bridle Technique Used to Measure Tube Placement:  Marking at nare/corner of mouth Cortrak Secured At:  65 cm   Cortrak Tube Team Note:  Consult received to place a Cortrak feeding tube.   X-ray is required, abdominal x-ray has been ordered by the Cortrak team. Please confirm tube placement before using the Cortrak tube.   If the tube becomes dislodged please keep the tube and contact the Cortrak team at [email or web address] for replacement.  If after hours and replacement cannot be delayed, place a NG tube and confirm placement with an abdominal x-ray.    Lockie Pares., RD, LDN, CNSC See AMiON for contact information

## 2022-11-22 NOTE — Progress Notes (Signed)
Inpatient Diabetes Program Recommendations  AACE/ADA: New Consensus Statement on Inpatient Glycemic Control (2015)  Target Ranges:  Prepandial:   less than 140 mg/dL      Peak postprandial:   less than 180 mg/dL (1-2 hours)      Critically ill patients:  140 - 180 mg/dL   Lab Results  Component Value Date   GLUCAP 145 (H) 11/22/2022   HGBA1C 13.5 (H) 11/21/2022    Review of Glycemic Control  Latest Reference Range & Units 11/22/22 10:28 11/22/22 11:16 11/22/22 12:35  Glucose-Capillary 70 - 99 mg/dL 181 (H) 178 (H) 145 (H)   History of Diet controlled DM Current orders for Inpatient glycemic control:  IV insulin  Inpatient Diabetes Program Recommendations:    Most recent insulin drip rate is 4.8 units/hr.  Coretrack placed today. Recommend continuation of insulin drip until tube feeds are at goal.  Thanks,  Adah Perl, RN, BC-ADM Inpatient Diabetes Coordinator Pager [phone]  (8a-5p)

## 2022-11-22 NOTE — Progress Notes (Signed)
Dr. Vaughan Browner made aware of critical serum sodium of 163.

## 2022-11-22 NOTE — Progress Notes (Signed)
Initial Nutrition Assessment  DOCUMENTATION CODES:   Not applicable  INTERVENTION:   Tube feeds via Cortrak: Initiate Vital 1.5 at 20 mL/hr an advance by 10 mL every 8 hours to goal rate of 60 mL/hr (1440 mL per day) 60 mL ProSource TF20 - daily Provides 2240 kcal, 117 gm  protein, and 1100 mL free water daily. Monitor magnesium, potassium, and phosphorus BID for at least 3 days, MD to replete as needed, given ongoing electrolyte abnormalities.  NUTRITION DIAGNOSIS:   Inadequate oral intake related to inability to eat as evidenced by NPO status.  GOAL:   Patient will meet greater than or equal to 90% of their needs  MONITOR:   Diet advancement, Labs, TF tolerance, I & O's  REASON FOR ASSESSMENT:   Other (Comment) (Cortrak List)    ASSESSMENT:   62 y.o. male presented to the ED with AMS. PMH includes T2DM, HTN, HLD, CVA with L side deficits, and dysphagia. Pt admitted with DKA, acute encephalopathy, sepsis 2/2 possible UTI, hypernatremia, and AKI.   1/14 - Admitted 1/15 - Cortrak placed (tip stomach)  Pt laying in bed, states that he was eating good at home.   Discussed case with RN and MD. MD ok with initiating enteral nutrition. Working on transitioning off insulin drip.  Medications reviewed and include: Protonix, IV antibiotics, D5, Insulin drip  Labs reviewed: Sodium 163, Potassium 3.5, BUN 90, Creatinine 3.69, Phosphorus 7.4, Magnesium 3.5, Hgb A1c 13.5%, 24 hr CBGs 106->600  NUTRITION - FOCUSED PHYSICAL EXAM:  Flowsheet Row Most Recent Value  Orbital Region No depletion  Upper Arm Region No depletion  Thoracic and Lumbar Region No depletion  Buccal Region No depletion  Temple Region No depletion  Clavicle Bone Region No depletion  Clavicle and Acromion Bone Region No depletion  Scapular Bone Region No depletion  Dorsal Hand No depletion  Patellar Region No depletion  Anterior Thigh Region No depletion  Posterior Calf Region No depletion  Edema (RD  Assessment) None  Hair Reviewed  Eyes Reviewed  Mouth Reviewed  Skin Reviewed  Nails Reviewed   Diet Order:   Diet Order             Diet NPO time specified  Diet effective now                   EDUCATION NEEDS:   Not appropriate for education at this time  Skin:  Skin Assessment: Reviewed RN Assessment  Last BM:  1/14  Height:   Ht Readings from Last 1 Encounters:  11/04/22 '6\' 2"'$  (1.88 m)    Weight:   Wt Readings from Last 1 Encounters:  11/22/22 114 kg    Ideal Body Weight:  86.4 kg  BMI:  Body mass index is 32.27 kg/m.  Estimated Nutritional Needs:   Kcal:  2200-2400  Protein:  110-125 grams  Fluid:  >/= 2 L    Hermina Barters RD, LDN Clinical Dietitian See [facility] for contact information.

## 2022-11-23 ENCOUNTER — Inpatient Hospital Stay: Payer: Self-pay

## 2022-11-23 DIAGNOSIS — A419 Sepsis, unspecified organism: Secondary | ICD-10-CM | POA: Diagnosis not present

## 2022-11-23 DIAGNOSIS — R6520 Severe sepsis without septic shock: Secondary | ICD-10-CM | POA: Diagnosis not present

## 2022-11-23 LAB — GLUCOSE, CAPILLARY
Glucose-Capillary: 146 mg/dL — ABNORMAL HIGH (ref 70–99)
Glucose-Capillary: 157 mg/dL — ABNORMAL HIGH (ref 70–99)
Glucose-Capillary: 159 mg/dL — ABNORMAL HIGH (ref 70–99)
Glucose-Capillary: 161 mg/dL — ABNORMAL HIGH (ref 70–99)
Glucose-Capillary: 168 mg/dL — ABNORMAL HIGH (ref 70–99)
Glucose-Capillary: 174 mg/dL — ABNORMAL HIGH (ref 70–99)
Glucose-Capillary: 174 mg/dL — ABNORMAL HIGH (ref 70–99)
Glucose-Capillary: 177 mg/dL — ABNORMAL HIGH (ref 70–99)
Glucose-Capillary: 177 mg/dL — ABNORMAL HIGH (ref 70–99)
Glucose-Capillary: 179 mg/dL — ABNORMAL HIGH (ref 70–99)
Glucose-Capillary: 185 mg/dL — ABNORMAL HIGH (ref 70–99)
Glucose-Capillary: 190 mg/dL — ABNORMAL HIGH (ref 70–99)
Glucose-Capillary: 195 mg/dL — ABNORMAL HIGH (ref 70–99)
Glucose-Capillary: 214 mg/dL — ABNORMAL HIGH (ref 70–99)
Glucose-Capillary: 218 mg/dL — ABNORMAL HIGH (ref 70–99)
Glucose-Capillary: 220 mg/dL — ABNORMAL HIGH (ref 70–99)
Glucose-Capillary: 222 mg/dL — ABNORMAL HIGH (ref 70–99)
Glucose-Capillary: 235 mg/dL — ABNORMAL HIGH (ref 70–99)
Glucose-Capillary: 236 mg/dL — ABNORMAL HIGH (ref 70–99)

## 2022-11-23 LAB — LACTIC ACID, PLASMA: Lactic Acid, Venous: 1.9 mmol/L (ref 0.5–1.9)

## 2022-11-23 LAB — COMPREHENSIVE METABOLIC PANEL
ALT: 22 U/L (ref 0–44)
AST: 51 U/L — ABNORMAL HIGH (ref 15–41)
Albumin: 2.1 g/dL — ABNORMAL LOW (ref 3.5–5.0)
Alkaline Phosphatase: 92 U/L (ref 38–126)
Anion gap: 12 (ref 5–15)
BUN: 63 mg/dL — ABNORMAL HIGH (ref 8–23)
CO2: 26 mmol/L (ref 22–32)
Calcium: 8.2 mg/dL — ABNORMAL LOW (ref 8.9–10.3)
Chloride: 117 mmol/L — ABNORMAL HIGH (ref 98–111)
Creatinine, Ser: 2.03 mg/dL — ABNORMAL HIGH (ref 0.61–1.24)
GFR, Estimated: 36 mL/min — ABNORMAL LOW (ref >60)
Glucose, Bld: 235 mg/dL — ABNORMAL HIGH (ref 70–99)
Potassium: 3.4 mmol/L — ABNORMAL LOW (ref 3.5–5.1)
Sodium: 155 mmol/L — ABNORMAL HIGH (ref 135–145)
Total Bilirubin: 0.5 mg/dL (ref 0.3–1.2)
Total Protein: 6.1 g/dL — ABNORMAL LOW (ref 6.5–8.1)

## 2022-11-23 LAB — URINE CULTURE: Culture: 50000 — AB

## 2022-11-23 LAB — CBC WITH DIFFERENTIAL/PLATELET
Abs Immature Granulocytes: 0.05 10*3/uL (ref 0.00–0.07)
Basophils Absolute: 0 10*3/uL (ref 0.0–0.1)
Basophils Relative: 0 %
Eosinophils Absolute: 0.1 10*3/uL (ref 0.0–0.5)
Eosinophils Relative: 1 %
HCT: 37.4 % — ABNORMAL LOW (ref 39.0–52.0)
Hemoglobin: 12.1 g/dL — ABNORMAL LOW (ref 13.0–17.0)
Immature Granulocytes: 1 %
Lymphocytes Relative: 9 %
Lymphs Abs: 0.9 10*3/uL (ref 0.7–4.0)
MCH: 29 pg (ref 26.0–34.0)
MCHC: 32.4 g/dL (ref 30.0–36.0)
MCV: 89.7 fL (ref 80.0–100.0)
Monocytes Absolute: 0.5 10*3/uL (ref 0.1–1.0)
Monocytes Relative: 5 %
Neutro Abs: 8.7 10*3/uL — ABNORMAL HIGH (ref 1.7–7.7)
Neutrophils Relative %: 84 %
Platelets: 80 10*3/uL — ABNORMAL LOW (ref 150–400)
RBC: 4.17 MIL/uL — ABNORMAL LOW (ref 4.22–5.81)
RDW: 15.5 % (ref 11.5–15.5)
WBC: 10.3 10*3/uL (ref 4.0–10.5)
nRBC: 0 % (ref 0.0–0.2)

## 2022-11-23 LAB — BASIC METABOLIC PANEL
Anion gap: 11 (ref 5–15)
BUN: 54 mg/dL — ABNORMAL HIGH (ref 8–23)
CO2: 24 mmol/L (ref 22–32)
Calcium: 8 mg/dL — ABNORMAL LOW (ref 8.9–10.3)
Chloride: 117 mmol/L — ABNORMAL HIGH (ref 98–111)
Creatinine, Ser: 1.85 mg/dL — ABNORMAL HIGH (ref 0.61–1.24)
GFR, Estimated: 40 mL/min — ABNORMAL LOW (ref >60)
Glucose, Bld: 180 mg/dL — ABNORMAL HIGH (ref 70–99)
Potassium: 4.1 mmol/L (ref 3.5–5.1)
Sodium: 152 mmol/L — ABNORMAL HIGH (ref 135–145)

## 2022-11-23 LAB — MAGNESIUM
Magnesium: 2.5 mg/dL — ABNORMAL HIGH (ref 1.7–2.4)
Magnesium: 2.3 mg/dL (ref 1.7–2.4)

## 2022-11-23 LAB — PHOSPHORUS
Phosphorus: 2.3 mg/dL — ABNORMAL LOW (ref 2.5–4.6)
Phosphorus: 3 mg/dL (ref 2.5–4.6)

## 2022-11-23 MED ORDER — SODIUM CHLORIDE 0.9% FLUSH
10.0000 mL | Freq: Two times a day (BID) | INTRAVENOUS | Status: DC
  Administered 2022-11-23: 30 mL
  Administered 2022-11-24 (×2): 10 mL
  Administered 2022-11-25: 40 mL
  Administered 2022-11-25 – 2022-12-05 (×17): 10 mL
  Administered 2022-12-06: 30 mL

## 2022-11-23 MED ORDER — POTASSIUM PHOSPHATES 15 MMOLE/5ML IV SOLN
15.0000 mmol | Freq: Once | INTRAVENOUS | Status: AC
  Administered 2022-11-23: 15 mmol via INTRAVENOUS
  Filled 2022-11-23: qty 5

## 2022-11-23 MED ORDER — SODIUM CHLORIDE 0.9% FLUSH: 10.0000 mL | INTRAVENOUS | Status: DC | PRN

## 2022-11-23 MED ORDER — POTASSIUM CHLORIDE 10 MEQ/100ML IV SOLN
10.0000 meq | INTRAVENOUS | Status: AC
  Administered 2022-11-23 (×2): 10 meq via INTRAVENOUS
  Filled 2022-11-23 (×2): qty 100

## 2022-11-23 MED ORDER — CEFAZOLIN SODIUM-DEXTROSE 2-4 GM/100ML-% IV SOLN
2.0000 g | Freq: Three times a day (TID) | INTRAVENOUS | Status: DC
  Administered 2022-11-23 – 2022-11-24 (×3): 2 g via INTRAVENOUS
  Filled 2022-11-23 (×4): qty 100

## 2022-11-23 NOTE — Progress Notes (Signed)
Peripherally Inserted Central Catheter Placement  The IV Nurse has discussed with the patient and/or persons authorized to consent for the patient, the purpose of this procedure and the potential benefits and risks involved with this procedure.  The benefits include less needle sticks, lab draws from the catheter, and the patient may be discharged home with the catheter. Risks include, but not limited to, infection, bleeding, blood clot (thrombus formation), and puncture of an artery; nerve damage and irregular heartbeat and possibility to perform a PICC exchange if needed/ordered by physician.  Alternatives to this procedure were also discussed.  Bard Power PICC patient education guide, fact sheet on infection prevention and patient information card has been provided to patient /or left at bedside. PICC placed by Shon Hale RN.   PICC Placement Documentation  PICC Triple Lumen 11/23/22 Right Brachial 41 cm 0 cm (Active)  Indication for Insertion or Continuance of Line Prolonged intravenous therapies 11/23/22 2125  Exposed Catheter (cm) 0 cm 11/23/22 2125  Site Assessment Clean, Dry, Intact 11/23/22 2125  Lumen #1 Status Blood return noted;Flushed;Saline locked 11/23/22 2125  Lumen #2 Status Blood return noted;Flushed;Saline locked 11/23/22 2125  Lumen #3 Status Blood return noted;Flushed;Saline locked 11/23/22 2125  Dressing Type Transparent;Securing device 11/23/22 2125  Dressing Status Clean, Dry, Intact;Antimicrobial disc in place 11/23/22 2125  Safety Lock Not Applicable 70/01/74 9449  Line Care Connections checked and tightened 11/23/22 2125  Line Adjustment (NICU/IV Team Only) No 11/23/22 2125  Dressing Intervention New dressing 11/23/22 2125  Dressing Change Due 11/30/22 11/23/22 2125       Edson Snowball 11/23/2022, 9:29 PM

## 2022-11-23 NOTE — Progress Notes (Signed)
PHARMACY - PHYSICIAN COMMUNICATION CRITICAL VALUE ALERT - BLOOD CULTURE IDENTIFICATION (BCID)  Terrill Alperin. is an 62 y.o. male who presented to [facility] on 11/20/2022 found down with DKA and possible sepsis (?urinary source)  Assessment:  1/3 Bcx: staphylococcus epidermidis + proteus species (aerobic bottle) - no resistance detected. Now also with gram-positive rod (unidentified on BCID) in the same culture set (anaerobic bottle)   Name of physician (or Provider) Contacted: Dr. Vaughan Browner   Current antibiotics: cefazolin 2g IV Q8H  Changes to prescribed antibiotics recommended:  No change to abx regimen - likely contaminant    Results for orders placed or performed during the hospital encounter of 11/20/22  Blood Culture ID Panel (Reflexed) (Collected: 11/20/2022  8:00 PM)  Result Value Ref Range   Enterococcus faecalis NOT DETECTED NOT DETECTED   Enterococcus Faecium NOT DETECTED NOT DETECTED   Listeria monocytogenes NOT DETECTED NOT DETECTED   Staphylococcus species DETECTED (A) NOT DETECTED   Staphylococcus aureus (BCID) NOT DETECTED NOT DETECTED   Staphylococcus epidermidis DETECTED (A) NOT DETECTED   Staphylococcus lugdunensis NOT DETECTED NOT DETECTED   Streptococcus species NOT DETECTED NOT DETECTED   Streptococcus agalactiae NOT DETECTED NOT DETECTED   Streptococcus pneumoniae NOT DETECTED NOT DETECTED   Streptococcus pyogenes NOT DETECTED NOT DETECTED   A.calcoaceticus-baumannii NOT DETECTED NOT DETECTED   Bacteroides fragilis NOT DETECTED NOT DETECTED   Enterobacterales DETECTED (A) NOT DETECTED   Enterobacter cloacae complex NOT DETECTED NOT DETECTED   Escherichia coli NOT DETECTED NOT DETECTED   Klebsiella aerogenes NOT DETECTED NOT DETECTED   Klebsiella oxytoca NOT DETECTED NOT DETECTED   Klebsiella pneumoniae NOT DETECTED NOT DETECTED   Proteus species DETECTED (A) NOT DETECTED   Salmonella species NOT DETECTED NOT DETECTED   Serratia marcescens NOT DETECTED NOT  DETECTED   Haemophilus influenzae NOT DETECTED NOT DETECTED   Neisseria meningitidis NOT DETECTED NOT DETECTED   Pseudomonas aeruginosa NOT DETECTED NOT DETECTED   Stenotrophomonas maltophilia NOT DETECTED NOT DETECTED   Candida albicans NOT DETECTED NOT DETECTED   Candida auris NOT DETECTED NOT DETECTED   Candida glabrata NOT DETECTED NOT DETECTED   Candida krusei NOT DETECTED NOT DETECTED   Candida parapsilosis NOT DETECTED NOT DETECTED   Candida tropicalis NOT DETECTED NOT DETECTED   Cryptococcus neoformans/gattii NOT DETECTED NOT DETECTED   CTX-M ESBL NOT DETECTED NOT DETECTED   Carbapenem resistance IMP NOT DETECTED NOT DETECTED   Carbapenem resistance KPC NOT DETECTED NOT DETECTED   Methicillin resistance mecA/C NOT DETECTED NOT DETECTED   Carbapenem resistance NDM NOT DETECTED NOT DETECTED   Carbapenem resist OXA 48 LIKE NOT DETECTED NOT DETECTED   Carbapenem resistance VIM NOT DETECTED NOT DETECTED    Adria Dill, PharmD PGY-2 Infectious Diseases Resident  11/23/2022 12:23 PM

## 2022-11-23 NOTE — Progress Notes (Signed)
Attempted to call patient's son and both daughters to obtain consent for PICC placement. Unable to make contact at this point, will attempt again later.

## 2022-11-23 NOTE — Plan of Care (Signed)
  Problem: Safety: Goal: Non-violent Restraint(s) Outcome: Progressing   Problem: Education: Goal: Ability to describe self-care measures that may prevent or decrease complications (Diabetes Survival Skills Education) will improve Outcome: Progressing Goal: Individualized Educational Video(s) Outcome: Progressing   Problem: Coping: Goal: Ability to adjust to condition or change in health will improve Outcome: Progressing   Problem: Fluid Volume: Goal: Ability to maintain a balanced intake and output will improve Outcome: Progressing   Problem: Health Behavior/Discharge Planning: Goal: Ability to identify and utilize available resources and services will improve Outcome: Progressing Goal: Ability to manage health-related needs will improve Outcome: Progressing   Problem: Metabolic: Goal: Ability to maintain appropriate glucose levels will improve Outcome: Progressing   Problem: Nutritional: Goal: Maintenance of adequate nutrition will improve Outcome: Progressing Goal: Progress toward achieving an optimal weight will improve Outcome: Progressing   Problem: Skin Integrity: Goal: Risk for impaired skin integrity will decrease Outcome: Progressing   Problem: Tissue Perfusion: Goal: Adequacy of tissue perfusion will improve Outcome: Progressing   Problem: Education: Goal: Ability to describe self-care measures that may prevent or decrease complications (Diabetes Survival Skills Education) will improve Outcome: Progressing Goal: Individualized Educational Video(s) Outcome: Progressing   Problem: Cardiac: Goal: Ability to maintain an adequate cardiac output will improve Outcome: Progressing   Problem: Health Behavior/Discharge Planning: Goal: Ability to identify and utilize available resources and services will improve Outcome: Progressing Goal: Ability to manage health-related needs will improve Outcome: Progressing   Problem: Fluid Volume: Goal: Ability to  achieve a balanced intake and output will improve Outcome: Progressing   Problem: Metabolic: Goal: Ability to maintain appropriate glucose levels will improve Outcome: Progressing   Problem: Nutritional: Goal: Maintenance of adequate nutrition will improve Outcome: Progressing Goal: Maintenance of adequate weight for body size and type will improve Outcome: Progressing   Problem: Respiratory: Goal: Will regain and/or maintain adequate ventilation Outcome: Progressing   Problem: Urinary Elimination: Goal: Ability to achieve and maintain adequate renal perfusion and functioning will improve Outcome: Progressing   Problem: Education: Goal: Knowledge of General Education information will improve Description: Including pain rating scale, medication(s)/side effects and non-pharmacologic comfort measures Outcome: Progressing   Problem: Health Behavior/Discharge Planning: Goal: Ability to manage health-related needs will improve Outcome: Progressing   Problem: Clinical Measurements: Goal: Ability to maintain clinical measurements within normal limits will improve Outcome: Progressing Goal: Will remain free from infection Outcome: Progressing Goal: Diagnostic test results will improve Outcome: Progressing Goal: Respiratory complications will improve Outcome: Progressing Goal: Cardiovascular complication will be avoided Outcome: Progressing   Problem: Activity: Goal: Risk for activity intolerance will decrease Outcome: Progressing   Problem: Nutrition: Goal: Adequate nutrition will be maintained Outcome: Progressing   Problem: Coping: Goal: Level of anxiety will decrease Outcome: Progressing   Problem: Elimination: Goal: Will not experience complications related to bowel motility Outcome: Progressing Goal: Will not experience complications related to urinary retention Outcome: Progressing   Problem: Pain Managment: Goal: General experience of comfort will  improve Outcome: Progressing   Problem: Safety: Goal: Ability to remain free from injury will improve Outcome: Progressing   Problem: Skin Integrity: Goal: Risk for impaired skin integrity will decrease Outcome: Progressing

## 2022-11-23 NOTE — Progress Notes (Signed)
eLink Physician-Brief Progress Note Patient Name: [name] [name]. DOB: Oct 20, 1961 MRN: [id number]   Date of Service  11/23/2022  HPI/Events of Note  DKA, potassium 3.4 Cr 2. On D5 fluids. On Insuline gtt. Making good urine.Cr 2  eICU Interventions  Klc replacement ordered     Intervention Category Intermediate Interventions: Electrolyte abnormality - evaluation and management  Elmer Sow 11/23/2022, 6:41 AM

## 2022-11-23 NOTE — Progress Notes (Signed)
Pharmacy Electrolyte Replacement  Recent Labs:  Recent Labs    11/23/22 0455  K 3.4*  MG 2.5*  PHOS 2.3*  CREATININE 2.03*    Low Critical Values (K </= 2.5, Phos </= 1, Mg </= 1) Present: None  MD Contacted: n/a  Plan: Potassium phosphate 15 mmol IV once in addition to potassium chloride 10 mEq IV x2 ordered earlier this morning Monitor repeat electrolytes

## 2022-11-23 NOTE — Progress Notes (Addendum)
NAME:  [name] Olivares., MRN:  [id number], DOB:  [birth year], LOS: 2 ADMISSION DATE:  11/20/2022, CONSULTATION DATE:  1/13 REFERRING MD:  Dr. Nechama Guard, CHIEF COMPLAINT:  DKA   History of Present Illness:  Patient is a 62 year old male with pertinent PMH of DMT2 controlled by diet, HTN, HLD, previous R CVA with left-sided deficits presents to [facility] ED on 1/13 with AMS.  Per patient's sister patient has been lethargic over the past week.  Patient uses walker at home and had a fall on 1/9.  Patient stated that he was having constipation and was taking Dulcolax suppositories.  Per patient's sister patient has been having multiple episodes of diarrhea over the past week.  States that he was more alert on 1/12 but noticed that he was more sleepy compared to normal.  On 1/13 patient found on floor with confusion.  EMS called and transferred patient to [facility].  Upon arrival to [facility] ED, patient tachycardia 140s found to be in A-fib RVR.  Glucose 1100, AG 29, CO2 14, pH 7.30, beta H > 8.  Patient started on insulin per Endo tool and IV fluids given. Patient confused. Patient temp 94.9 F and WBC 16.  LA 3.2.  Cultures obtained.  Started on broad-spectrum antibiotics.  CXR with no significant findings.  PCCM consulted for ICU admission.  Pertinent ED labs: NA 153, K6.1, creat 5.5, Trop 20  Pending: resp panel, ua/uc, bcx2  Pertinent  Medical History   Past Medical History:  Diagnosis Date   Acute respiratory failure (HCC)    Cerebral edema (HCC)    Diabetes mellitus without complication ([place])    diet controlled- no meds   Dysphagia as late effect of cerebrovascular disease    Fall at home    Hemiparesis affecting left side as late effect of stroke ([place])    Hyperlipidemia    Hypertension    Stroke ([place]) 2016   Tuberculosis    11 yrs ago per pt.- treated with meds per pt.    Significant Hospital Events: Including procedures, antibiotic start and stop dates in addition to other pertinent events   1/13  admitted to [facility] encephalopathic; DKA; PCCM consulted   Interim History / Subjective:   Remains on insulin drip Unable to get peripheral IVs to remove femoral central line  Objective   Blood pressure 134/80, pulse 75, temperature 99 F (37.2 C), temperature source Oral, resp. rate 18, weight 114 kg, SpO2 95 %.        Intake/Output Summary (Last 24 hours) at 11/23/2022 0911 Last data filed at 11/23/2022 0800 Gross per 24 hour  Intake 3918.16 ml  Output 1870 ml  Net 2048.16 ml   Filed Weights   11/20/22 2251 11/21/22 0621 11/22/22 0500  Weight: 125.2 kg 114 kg 114 kg    Examination: Gen:      No acute distress, chronically ill-appearing HEENT:  EOMI, sclera anicteric Neck:     No masses; no thyromegaly Lungs:    Clear to auscultation bilaterally; normal respiratory effort CV:         Regular rate and rhythm; no murmurs Abd:      + bowel sounds; soft, non-tender; no palpable masses, no distension Ext:    No edema; adequate peripheral perfusion Skin:      Warm and dry; no rash Neuro: Awake, responsive, Rt arm contracture, rt hemiparesis  Labs/imaging reviewed Significant for sodium 155, potassium 3.4, creatinine 2.03 AST 51, ALT 22, total bilirubin 0.5  Resolved Hospital Problem  list     Assessment & Plan:  Severe sepsis, present on admission Likely secondary to UTI, Proteus bacteremia Staph epidermidis is likely contaminant Narrow antibiotics to cefazolin Follow cultures  DKA Continue on insulin drip.  Continues to need dextrose water drip for hypernatremia Continue IV fluid hydration Follow hemoglobin A1c  Acute encephalopathy: likely due to above with component of sepsis Hx of R CVA: w/ left sided hemiparesis CT head no acute abnormality; remote R mca infarct w/ vascular stent in right M1 segment Limit sedation medications.  Follow mental status Delirium precautions Ammonia and TSH levels are normal Urine drug screen is normal  Acute pancreatitis May have  passed a biliary stone.  Will need to get EtOH history Recheck hepatic panel, lactic acid IV fluids  Hypernatremia, AKI Continue D5, free water via tube  New onset Afib RVR: likely in setting of hypovolemia and sepsis Telemetry monitoring  Prolonged qtc -telemetry monitoring -check mag -avoid qtc prolonging agents  HTN HLD Hold anti-hypertensives/statin/ASA while npo prn iv hydralazine for HTN  GERD PPI  MDD hold fluoxetine given prolonged qtc  Best Practice (right click and "Reselect all SmartList Selections" daily)   Diet/type: NPO DVT prophylaxis: prophylactic heparin  GI prophylaxis: PPI Lines: Central line Foley:  Yes, and it is still needed Code Status:  full code Last date of multidisciplinary goals of care discussion '[]'$ . Daughter Mickey Farber updated on 1/16  Critical care time:    The patient is critically ill with multiple organ system failure and requires high complexity decision making for assessment and support, frequent evaluation and titration of therapies, advanced monitoring, review of radiographic studies and interpretation of complex data.   Critical Care Time devoted to patient care services, exclusive of separately billable procedures, described in this note is 35 minutes.   Marshell Garfinkel MD [facility] See Amion for pager  If no response to pager , please call [phone] until 7pm After 7:00 pm call Elink  [phone] 11/23/2022, 9:12 AM

## 2022-11-24 ENCOUNTER — Inpatient Hospital Stay (HOSPITAL_COMMUNITY): Payer: Medicare (Managed Care)

## 2022-11-24 DIAGNOSIS — E1110 Type 2 diabetes mellitus with ketoacidosis without coma: Secondary | ICD-10-CM | POA: Diagnosis not present

## 2022-11-24 LAB — CBC
HCT: 34.5 % — ABNORMAL LOW (ref 39.0–52.0)
Hemoglobin: 10.9 g/dL — ABNORMAL LOW (ref 13.0–17.0)
MCH: 28.8 pg (ref 26.0–34.0)
MCHC: 31.6 g/dL (ref 30.0–36.0)
MCV: 91.3 fL (ref 80.0–100.0)
Platelets: 67 10*3/uL — ABNORMAL LOW (ref 150–400)
RBC: 3.78 MIL/uL — ABNORMAL LOW (ref 4.22–5.81)
RDW: 15.3 % (ref 11.5–15.5)
WBC: 9.6 10*3/uL (ref 4.0–10.5)
nRBC: 0 % (ref 0.0–0.2)

## 2022-11-24 LAB — GLUCOSE, CAPILLARY
Glucose-Capillary: 127 mg/dL — ABNORMAL HIGH (ref 70–99)
Glucose-Capillary: 142 mg/dL — ABNORMAL HIGH (ref 70–99)
Glucose-Capillary: 145 mg/dL — ABNORMAL HIGH (ref 70–99)
Glucose-Capillary: 149 mg/dL — ABNORMAL HIGH (ref 70–99)
Glucose-Capillary: 155 mg/dL — ABNORMAL HIGH (ref 70–99)
Glucose-Capillary: 156 mg/dL — ABNORMAL HIGH (ref 70–99)
Glucose-Capillary: 163 mg/dL — ABNORMAL HIGH (ref 70–99)
Glucose-Capillary: 164 mg/dL — ABNORMAL HIGH (ref 70–99)
Glucose-Capillary: 177 mg/dL — ABNORMAL HIGH (ref 70–99)
Glucose-Capillary: 180 mg/dL — ABNORMAL HIGH (ref 70–99)
Glucose-Capillary: 208 mg/dL — ABNORMAL HIGH (ref 70–99)
Glucose-Capillary: 216 mg/dL — ABNORMAL HIGH (ref 70–99)
Glucose-Capillary: 231 mg/dL — ABNORMAL HIGH (ref 70–99)
Glucose-Capillary: 253 mg/dL — ABNORMAL HIGH (ref 70–99)

## 2022-11-24 LAB — BASIC METABOLIC PANEL
Anion gap: 8 (ref 5–15)
BUN: 36 mg/dL — ABNORMAL HIGH (ref 8–23)
CO2: 24 mmol/L (ref 22–32)
Calcium: 6.8 mg/dL — ABNORMAL LOW (ref 8.9–10.3)
Chloride: 109 mmol/L (ref 98–111)
Creatinine, Ser: 1.28 mg/dL — ABNORMAL HIGH (ref 0.61–1.24)
GFR, Estimated: >60 mL/min (ref >60)
Glucose, Bld: 379 mg/dL — ABNORMAL HIGH (ref 70–99)
Potassium: 3.1 mmol/L — ABNORMAL LOW (ref 3.5–5.1)
Sodium: 141 mmol/L (ref 135–145)

## 2022-11-24 LAB — MAGNESIUM: Magnesium: 2 mg/dL (ref 1.7–2.4)

## 2022-11-24 LAB — PHOSPHORUS: Phosphorus: 2 mg/dL — ABNORMAL LOW (ref 2.5–4.6)

## 2022-11-24 MED ORDER — DOCUSATE SODIUM 50 MG/5ML PO LIQD
100.0000 mg | Freq: Two times a day (BID) | ORAL | Status: DC | PRN
  Administered 2022-11-25: 100 mg
  Filled 2022-11-24: qty 10

## 2022-11-24 MED ORDER — CALCIUM GLUCONATE-NACL 2-0.675 GM/100ML-% IV SOLN
2.0000 g | Freq: Once | INTRAVENOUS | Status: AC
  Administered 2022-11-24: 2000 mg via INTRAVENOUS
  Filled 2022-11-24: qty 100

## 2022-11-24 MED ORDER — POTASSIUM PHOSPHATES 15 MMOLE/5ML IV SOLN
15.0000 mmol | Freq: Once | INTRAVENOUS | Status: AC
  Administered 2022-11-24: 15 mmol via INTRAVENOUS
  Filled 2022-11-24: qty 5

## 2022-11-24 MED ORDER — INSULIN GLARGINE-YFGN 100 UNIT/ML ~~LOC~~ SOLN
50.0000 [IU] | Freq: Two times a day (BID) | SUBCUTANEOUS | Status: DC
  Administered 2022-11-24 – 2022-11-28 (×7): 50 [IU] via SUBCUTANEOUS
  Filled 2022-11-24 (×11): qty 0.5

## 2022-11-24 MED ORDER — POTASSIUM CHLORIDE 20 MEQ PO PACK
20.0000 meq | PACK | Freq: Once | ORAL | Status: AC
  Administered 2022-11-24: 20 meq
  Filled 2022-11-24: qty 1

## 2022-11-24 MED ORDER — ACETAMINOPHEN 325 MG PO TABS
650.0000 mg | ORAL_TABLET | ORAL | Status: AC | PRN
  Administered 2022-11-24 – 2022-11-26 (×3): 650 mg
  Filled 2022-11-24 (×2): qty 2

## 2022-11-24 MED ORDER — POLYETHYLENE GLYCOL 3350 17 G PO PACK
17.0000 g | PACK | Freq: Every day | ORAL | Status: DC | PRN
  Administered 2022-11-24: 17 g

## 2022-11-24 MED ORDER — POTASSIUM CHLORIDE 10 MEQ/50ML IV SOLN
10.0000 meq | INTRAVENOUS | Status: AC
  Administered 2022-11-24 (×2): 10 meq via INTRAVENOUS
  Filled 2022-11-24 (×2): qty 50

## 2022-11-24 MED ORDER — ACETAMINOPHEN 325 MG PO TABS
650.0000 mg | ORAL_TABLET | ORAL | Status: DC | PRN
  Administered 2022-11-24: 650 mg via ORAL
  Filled 2022-11-24 (×2): qty 2

## 2022-11-24 MED ORDER — ATORVASTATIN CALCIUM 40 MG PO TABS
40.0000 mg | ORAL_TABLET | Freq: Every day | ORAL | Status: DC
  Administered 2022-11-24 – 2022-11-28 (×5): 40 mg
  Filled 2022-11-24 (×5): qty 1

## 2022-11-24 MED ORDER — INSULIN ASPART 100 UNIT/ML IJ SOLN
12.0000 [IU] | INTRAMUSCULAR | Status: DC
  Administered 2022-11-24 – 2022-11-27 (×16): 12 [IU] via SUBCUTANEOUS

## 2022-11-24 MED ORDER — AMLODIPINE BESYLATE 10 MG PO TABS
10.0000 mg | ORAL_TABLET | Freq: Every day | ORAL | Status: DC
  Administered 2022-11-24 – 2022-12-01 (×7): 10 mg
  Filled 2022-11-24 (×8): qty 1

## 2022-11-24 MED ORDER — POTASSIUM CHLORIDE 20 MEQ PO PACK
40.0000 meq | PACK | Freq: Once | ORAL | Status: AC
  Administered 2022-11-24: 40 meq via ORAL
  Filled 2022-11-24: qty 2

## 2022-11-24 MED ORDER — FREE WATER
100.0000 mL | Status: DC
  Administered 2022-11-24 – 2022-11-28 (×20): 100 mL

## 2022-11-24 MED ORDER — INSULIN ASPART 100 UNIT/ML IJ SOLN
0.0000 [IU] | INTRAMUSCULAR | Status: DC
  Administered 2022-11-24 (×2): 4 [IU] via SUBCUTANEOUS
  Administered 2022-11-24: 11 [IU] via SUBCUTANEOUS
  Administered 2022-11-25: 20 [IU] via SUBCUTANEOUS
  Administered 2022-11-25 (×2): 7 [IU] via SUBCUTANEOUS
  Administered 2022-11-25: 4 [IU] via SUBCUTANEOUS
  Administered 2022-11-25: 15 [IU] via SUBCUTANEOUS
  Administered 2022-11-26: 20 [IU] via SUBCUTANEOUS
  Administered 2022-11-26: 15 [IU] via SUBCUTANEOUS
  Administered 2022-11-26: 20 [IU] via SUBCUTANEOUS
  Administered 2022-11-26 – 2022-11-27 (×2): 7 [IU] via SUBCUTANEOUS
  Administered 2022-11-27 (×2): 3 [IU] via SUBCUTANEOUS
  Administered 2022-11-27: 4 [IU] via SUBCUTANEOUS
  Administered 2022-11-27: 3 [IU] via SUBCUTANEOUS
  Administered 2022-11-28: 11 [IU] via SUBCUTANEOUS
  Administered 2022-11-28: 15 [IU] via SUBCUTANEOUS
  Administered 2022-11-28 – 2022-11-29 (×2): 4 [IU] via SUBCUTANEOUS

## 2022-11-24 MED ORDER — CEFAZOLIN SODIUM-DEXTROSE 2-4 GM/100ML-% IV SOLN
2.0000 g | Freq: Three times a day (TID) | INTRAVENOUS | Status: AC
  Administered 2022-11-24 – 2022-11-29 (×16): 2 g via INTRAVENOUS
  Filled 2022-11-24 (×17): qty 100

## 2022-11-24 NOTE — Evaluation (Signed)
Physical Therapy Evaluation Patient Details Name: [name] [name]. MRN: [id number] DOB: 09-03-1961 Today's Date: 11/24/2022  History of Present Illness  62 yo male admitted 1/13 after found on the floor at home. Pt with DKA, hypothermic, hypotensive, AFib with RVR. PMhx: CVA with lt hemiplegia, DM, vascular dementia, HTN, HLD  Clinical Impression  Pt admitted with above diagnosis. Pt with difficulty coming to EOB and maintaining balance once sitting. LEaning right and needed assist to sit EOB. Most likely will need SNF for therapy prior to d/c home as pt lives alone.  Will follow acutely.  Pt currently with functional limitations due to the deficits listed below (see PT Problem List). Pt will benefit from skilled PT to increase their independence and safety with mobility to allow discharge to the venue listed below.          Recommendations for follow up therapy are one component of a multi-disciplinary discharge planning process, led by the attending physician.  Recommendations may be updated based on patient status, additional functional criteria and insurance authorization.  Follow Up Recommendations Skilled nursing-short term rehab (<3 hours/day) Can patient physically be transported by private vehicle: Yes    Assistance Recommended at Discharge Intermittent Supervision/Assistance  Patient can return home with the following  A lot of help with walking and/or transfers;A little help with bathing/dressing/bathroom;Assistance with cooking/housework;Assist for transportation;Help with stairs or ramp for entrance    Equipment Recommendations Other (comment) (TBA)  Recommendations for Other Services       Functional Status Assessment Patient has had a recent decline in their functional status and demonstrates the ability to make significant improvements in function in a reasonable and predictable amount of time.     Precautions / Restrictions Precautions Precautions:  Fall Restrictions Weight Bearing Restrictions: No      Mobility  Bed Mobility Overal bed mobility: Needs Assistance Bed Mobility: Supine to Sit, Rolling Rolling: Min assist   Supine to sit: Mod assist, Min assist     General bed mobility comments: Difficulty rolling to right side needing assist. Pt needed mod assist pushing up from right side.  once up in sitting had difficulty getting his balance needing min guard to min assist with pt leaning right.    Transfers                        Ambulation/Gait                  Stairs            Wheelchair Mobility    Modified Rankin (Stroke Patients Only)       Balance Overall balance assessment: Needs assistance Sitting-balance support: Feet supported, Single extremity supported Sitting balance-Leahy Scale: Poor Sitting balance - Comments: needed min to min gaurd assist to sit EOB 10 min.  Leaning right at times. Postural control: Right lateral lean                                   Pertinent Vitals/Pain      Home Living   Living Arrangements: Alone Available Help at Discharge: Available PRN/intermittently Type of Home: Apartment Home Access: Level entry       Home Layout: One level Home Equipment:  (Pt states he has a "curved walker")      Prior Function  Mobility Comments: states he used curved walker in the house ADLs Comments: Pt cooks and sponge bathes     Hand Dominance   Dominant Hand: Right    Extremity/Trunk Assessment   Upper Extremity Assessment Upper Extremity Assessment: Defer to OT evaluation    Lower Extremity Assessment Lower Extremity Assessment: LLE deficits/detail LLE Deficits / Details: grossly 3-/5    Cervical / Trunk Assessment Cervical / Trunk Assessment: Kyphotic  Communication   Communication: Expressive difficulties  Cognition Arousal/Alertness: Awake/alert Behavior During Therapy: WFL for tasks  assessed/performed Overall Cognitive Status: Within Functional Limits for tasks assessed                                          General Comments General comments (skin integrity, edema, etc.): VSS    Exercises General Exercises - Lower Extremity Ankle Circles/Pumps: AROM, Both, 10 reps, Seated Long Arc Quad: AROM, Both, 10 reps, Seated   Assessment/Plan    PT Assessment Patient needs continued PT services  PT Problem List Decreased activity tolerance;Decreased balance;Decreased mobility;Decreased knowledge of use of DME;Decreased safety awareness;Decreased knowledge of precautions;Decreased strength;Decreased coordination       PT Treatment Interventions DME instruction;Gait training;Functional mobility training;Therapeutic activities;Therapeutic exercise;Balance training;Patient/family education    PT Goals (Current goals can be found in the Care Plan section)  Acute Rehab PT Goals Patient Stated Goal: to get better PT Goal Formulation: With patient Time For Goal Achievement: 12/08/22 Potential to Achieve Goals: Good    Frequency Min 3X/week     Co-evaluation               AM-PAC PT "6 Clicks" Mobility  Outcome Measure Help needed turning from your back to your side while in a flat bed without using bedrails?: A Little Help needed moving from lying on your back to sitting on the side of a flat bed without using bedrails?: A Lot Help needed moving to and from a bed to a chair (including a wheelchair)?: A Lot Help needed standing up from a chair using your arms (e.g., wheelchair or bedside chair)?: Total Help needed to walk in hospital room?: Total Help needed climbing 3-5 steps with a railing? : Total 6 Click Score: 10    End of Session Equipment Utilized During Treatment: Gait belt Activity Tolerance: Patient limited by fatigue Patient left: in bed;with call bell/phone within reach;with bed alarm set Nurse Communication: Mobility status PT  Visit Diagnosis: Unsteadiness on feet (R26.81);Muscle weakness (generalized) (M62.81)    Time: 6381-7711 PT Time Calculation (min) (ACUTE ONLY): 22 min   Charges:   PT Evaluation $PT Eval Moderate Complexity: 1 Mod          Anesia Blackwell M,PT Acute Rehab Services [phone]   Alvira Philips 11/24/2022, 2:12 PM

## 2022-11-24 NOTE — Plan of Care (Signed)
  Problem: Safety: Goal: Non-violent Restraint(s) Outcome: Progressing   Problem: Education: Goal: Ability to describe self-care measures that may prevent or decrease complications (Diabetes Survival Skills Education) will improve Outcome: Progressing Goal: Individualized Educational Video(s) Outcome: Progressing   Problem: Coping: Goal: Ability to adjust to condition or change in health will improve Outcome: Progressing   Problem: Fluid Volume: Goal: Ability to maintain a balanced intake and output will improve Outcome: Progressing   Problem: Health Behavior/Discharge Planning: Goal: Ability to identify and utilize available resources and services will improve Outcome: Progressing Goal: Ability to manage health-related needs will improve Outcome: Progressing   Problem: Metabolic: Goal: Ability to maintain appropriate glucose levels will improve Outcome: Progressing   Problem: Nutritional: Goal: Maintenance of adequate nutrition will improve Outcome: Progressing Goal: Progress toward achieving an optimal weight will improve Outcome: Progressing   Problem: Skin Integrity: Goal: Risk for impaired skin integrity will decrease Outcome: Progressing   Problem: Tissue Perfusion: Goal: Adequacy of tissue perfusion will improve Outcome: Progressing   Problem: Education: Goal: Ability to describe self-care measures that may prevent or decrease complications (Diabetes Survival Skills Education) will improve Outcome: Progressing Goal: Individualized Educational Video(s) Outcome: Progressing   Problem: Cardiac: Goal: Ability to maintain an adequate cardiac output will improve Outcome: Progressing   Problem: Health Behavior/Discharge Planning: Goal: Ability to identify and utilize available resources and services will improve Outcome: Progressing Goal: Ability to manage health-related needs will improve Outcome: Progressing   Problem: Fluid Volume: Goal: Ability to  achieve a balanced intake and output will improve Outcome: Progressing   Problem: Metabolic: Goal: Ability to maintain appropriate glucose levels will improve Outcome: Progressing   Problem: Nutritional: Goal: Maintenance of adequate nutrition will improve Outcome: Progressing Goal: Maintenance of adequate weight for body size and type will improve Outcome: Progressing   Problem: Respiratory: Goal: Will regain and/or maintain adequate ventilation Outcome: Progressing   Problem: Urinary Elimination: Goal: Ability to achieve and maintain adequate renal perfusion and functioning will improve Outcome: Progressing   Problem: Education: Goal: Knowledge of General Education information will improve Description: Including pain rating scale, medication(s)/side effects and non-pharmacologic comfort measures Outcome: Progressing   Problem: Health Behavior/Discharge Planning: Goal: Ability to manage health-related needs will improve Outcome: Progressing   Problem: Clinical Measurements: Goal: Ability to maintain clinical measurements within normal limits will improve Outcome: Progressing Goal: Will remain free from infection Outcome: Progressing Goal: Diagnostic test results will improve Outcome: Progressing Goal: Respiratory complications will improve Outcome: Progressing Goal: Cardiovascular complication will be avoided Outcome: Progressing   Problem: Activity: Goal: Risk for activity intolerance will decrease Outcome: Progressing   Problem: Nutrition: Goal: Adequate nutrition will be maintained Outcome: Progressing   Problem: Coping: Goal: Level of anxiety will decrease Outcome: Progressing   Problem: Elimination: Goal: Will not experience complications related to bowel motility Outcome: Progressing Goal: Will not experience complications related to urinary retention Outcome: Progressing   Problem: Pain Managment: Goal: General experience of comfort will  improve Outcome: Progressing   Problem: Safety: Goal: Ability to remain free from injury will improve Outcome: Progressing   Problem: Skin Integrity: Goal: Risk for impaired skin integrity will decrease Outcome: Progressing

## 2022-11-24 NOTE — Evaluation (Signed)
Clinical/Bedside Swallow Evaluation Patient Details  Name: [name] [name]. MRN: [id number] Date of Birth: 09/03/1961  Today's Date: 11/24/2022 Time: SLP Start Time (ACUTE ONLY): 1522 SLP Stop Time (ACUTE ONLY): 1536 SLP Time Calculation (min) (ACUTE ONLY): 14 min  Past Medical History:  Past Medical History:  Diagnosis Date   Acute respiratory failure ([place])    Cerebral edema (HCC)    Diabetes mellitus without complication ([place])    diet controlled- no meds   Dysphagia as late effect of cerebrovascular disease    Fall at home    Hemiparesis affecting left side as late effect of stroke ([place])    Hyperlipidemia    Hypertension    Stroke ([place]) 2016   Tuberculosis    11 yrs ago per pt.- treated with meds per pt.   Past Surgical History:  Past Surgical History:  Procedure Laterality Date   COLONOSCOPY  10/06/2016   ESOPHAGOGASTRODUODENOSCOPY N/A 08/12/2015   Procedure: ESOPHAGOGASTRODUODENOSCOPY (EGD);  Surgeon: Ladene Artist, MD;  Location: [facility] ENDOSCOPY;  Service: Endoscopy;  Laterality: N/A;   feeding tube removed     GASTROSTOMY TUBE PLACEMENT  05-19-15   mechanical thrombectomy angioplasty stenet placement     SKIN GRAFT  1980's   ankle   UPPER GASTROINTESTINAL ENDOSCOPY     HPI:  62 yo male admitted 1/13 after found on the floor at home. Dx acute respiratory failure, severe sepsis, acute encephalopathy, acute pancreatitis.  Pt with DKA, hypothermic, hypotensive, AFib with RVR. PMhx: right CVA with lt hemiplegia, DM, vascular dementia, HTN, HLD. Remote hx of dysphagia after stroke in 2016.    Assessment / Plan / Recommendation  Clinical Impression  Pt participated in clinical swallow assessment - he was alert, oriented to person, answered yes/no questions with impaired reliability. Oral mechanism exam revealed baseline mild left facial asymmetry; missing most of his dentition.  Demonstrated good oral effort with active manipulation of ice chips, applesauce.  There was  intermittent wet coughing observed with all PO trials (excluding ice chips) - purees, thin liquids, nectar thick liquids. Given ongoing cough regardless of consistency, recommend continuing NPO for now; allow ice chips after oral care. SLP will f/u to determine PO readiness. D/W RN. SLP Visit Diagnosis: Dysphagia, unspecified (R13.10)    Aspiration Risk    tba   Diet Recommendation   NPO except ice chips  Medication Administration: Via alternative means    Other  Recommendations Oral Care Recommendations: Oral care QID;Oral care prior to ice chip/H20    Recommendations for follow up therapy are one component of a multi-disciplinary discharge planning process, led by the attending physician.  Recommendations may be updated based on patient status, additional functional criteria and insurance authorization.  Follow up Recommendations Other (comment) (tba)              Frequency and Duration min 2x/week  2 weeks       Prognosis Prognosis for Safe Diet Advancement: Good      Swallow Study   General Date of Onset: 11/20/22 HPI: 62 yo male admitted 1/13 after found on the floor at home. Dx acute respiratory failure, severe sepsis, acute encephalopathy, acute pancreatitis.  Pt with DKA, hypothermic, hypotensive, AFib with RVR. PMhx: right CVA with lt hemiplegia, DM, vascular dementia, HTN, HLD. Remote hx of dysphagia after stroke in 2016. Type of Study: Bedside Swallow Evaluation Previous Swallow Assessment: see HPI Diet Prior to this Study: NPO;NG Tube Temperature Spikes Noted: Yes Respiratory Status: Nasal cannula History of Recent  Intubation: No Behavior/Cognition: Alert;Confused Oral Cavity Assessment: Within Functional Limits Oral Care Completed by SLP: Recent completion by staff Oral Cavity - Dentition: Missing dentition Self-Feeding Abilities: Total assist Patient Positioning: Upright in bed Baseline Vocal Quality: Normal Volitional Cough: Strong Volitional Swallow: Able  to elicit    Oral/Motor/Sensory Function Overall Oral Motor/Sensory Function: Mild impairment Facial Symmetry: Abnormal symmetry left;Suspected CN VII (facial) dysfunction Lingual Symmetry: Within Functional Limits   Ice Chips Ice chips: Within functional limits   Thin Liquid Thin Liquid: Impaired Presentation: Cup;Spoon Pharyngeal  Phase Impairments: Cough - Immediate    Nectar Thick Nectar Thick Liquid: Impaired Presentation: Spoon;Cup Pharyngeal Phase Impairments: Cough - Immediate   Honey Thick Honey Thick Liquid: Not tested   Puree Puree: Impaired Pharyngeal Phase Impairments: Cough - Immediate   Solid     Solid: Not tested      Juan Quam Laurice 11/24/2022,3:51 PM  Estill Bamberg L. Tivis Ringer, MA CCC/SLP Clinical Specialist - [place] Office number [phone]

## 2022-11-24 NOTE — Progress Notes (Signed)
NAME:  [name] Harr., MRN:  [id number], DOB:  [birth year], LOS: 3 ADMISSION DATE:  11/20/2022, CONSULTATION DATE:  1/13 REFERRING MD:  Dr. Nechama Guard, CHIEF COMPLAINT:  DKA   History of Present Illness:  Patient is a 62 year old male with pertinent PMH of DMT2 controlled by diet, HTN, HLD, previous R CVA with left-sided deficits presents to [facility] ED on 1/13 with AMS.  Per patient's sister patient has been lethargic over the past week.  Patient uses walker at home and had a fall on 1/9.  Patient stated that he was having constipation and was taking Dulcolax suppositories.  Per patient's sister patient has been having multiple episodes of diarrhea over the past week.  States that he was more alert on 1/12 but noticed that he was more sleepy compared to normal.  On 1/13 patient found on floor with confusion.  EMS called and transferred patient to [facility].  Upon arrival to [facility] ED, patient tachycardia 140s found to be in A-fib RVR.  Glucose 1100, AG 29, CO2 14, pH 7.30, beta H > 8.  Patient started on insulin per Endo tool and IV fluids given. Patient confused. Patient temp 94.9 F and WBC 16.  LA 3.2.  Cultures obtained.  Started on broad-spectrum antibiotics.  CXR with no significant findings.  PCCM consulted for ICU admission.  Pertinent ED labs: NA 153, K6.1, creat 5.5, Trop 20  Pending: resp panel, ua/uc, bcx2  Pertinent  Medical History   Past Medical History:  Diagnosis Date   Acute respiratory failure (HCC)    Cerebral edema (HCC)    Diabetes mellitus without complication ([place])    diet controlled- no meds   Dysphagia as late effect of cerebrovascular disease    Fall at home    Hemiparesis affecting left side as late effect of stroke ([place])    Hyperlipidemia    Hypertension    Stroke ([place]) 2016   Tuberculosis    11 yrs ago per pt.- treated with meds per pt.    Significant Hospital Events: Including procedures, antibiotic start and stop dates in addition to other pertinent events   1/13  admitted to [facility] encephalopathic; DKA; PCCM consulted   Interim History / Subjective:   No acute events.  Had a episode of emesis today morning Tube feeds held Has low-grade fever, increased chest congestion  Objective   Blood pressure (!) 149/79, pulse 70, temperature 100.2 F (37.9 C), temperature source Axillary, resp. rate 14, weight 116.8 kg, SpO2 97 %.        Intake/Output Summary (Last 24 hours) at 11/24/2022 0841 Last data filed at 11/24/2022 0805 Gross per 24 hour  Intake 4667.29 ml  Output 2975 ml  Net 1692.29 ml   Filed Weights   11/21/22 0621 11/22/22 0500 11/24/22 0326  Weight: 114 kg 114 kg 116.8 kg    Examination: Gen:      No acute distress chronically ill-appearing HEENT:  EOMI, sclera anicteric Neck:     No masses; no thyromegaly Lungs:    Clear to auscultation bilaterally; normal respiratory effort CV:         Regular rate and rhythm; no murmurs Abd:      + bowel sounds; soft, non-tender; no palpable masses, no distension Ext:    No edema; adequate peripheral perfusion Skin:      Warm and dry; no rash Neuro: Awake, left arm contracture, left hemiparesis, garbled speech  Labs/imaging reviewed Significant for potassium 3.1, sodium 141 creatinine 1.28, calcium 6.8, Phos  2.0 Hemoglobin 10.9, platelets 67  Resolved Hospital Problem list     Assessment & Plan:  Severe sepsis, present on admission Likely secondary to UTI, Proteus bacteremia Staph epidermidis is likely contaminant Narrow antibiotics to cefazolin on 1/17 Check tracheal aspirate and chest x-ray  DKA HbA1C is 13.5 Stop D5 as sodium is normal.  Will transition off insulin drip  Acute encephalopathy: likely due to above with component of sepsis Hx of R CVA: w/ left sided hemiparesis CT head no acute abnormality; remote R mca infarct w/ vascular stent in right M1 segment Limit sedation medications.  Follow mental status Delirium precautions Ammonia and TSH levels are normal Urine drug  screen is normal  Acute pancreatitis May have passed a biliary stone.  No Etoh per patient Ammonia is normal Supportive care Check abd x ray to r/o ileus as he had emesis today  Hypernatremia, AKI Improving. Monitor labs  New onset Afib RVR: likely in setting of hypovolemia and sepsis Telemetry monitoring  Prolonged qtc Telemetry monitoring Avoid qtc prolonging agents  HTN HLD Restart home norvasc, statin PRN iv hydralazine for HTN  GERD PPI  MDD Hold fluoxetine given prolonged qtc  Best Practice (right click and "Reselect all SmartList Selections" daily)   Diet/type: NPO, speech eval today DVT prophylaxis: prophylactic heparin  GI prophylaxis: PPI Lines: Central line Foley:  Yes, and it is still needed Code Status:  full code Last date of multidisciplinary goals of care discussion '[]'$ . Daughter Mickey Farber updated on 1/16  Critical care time:    The patient is critically ill with multiple organ system failure and requires high complexity decision making for assessment and support, frequent evaluation and titration of therapies, advanced monitoring, review of radiographic studies and interpretation of complex data.   Critical Care Time devoted to patient care services, exclusive of separately billable procedures, described in this note is 35 minutes.   Marshell Garfinkel MD [place] [facility] See Amion for pager  If no response to pager , please call [phone] until 7pm After 7:00 pm call Elink  [phone] 11/24/2022, 8:41 AM

## 2022-11-24 NOTE — Progress Notes (Signed)
[facility] ADULT ICU REPLACEMENT PROTOCOL   The patient does apply for the [facility] Adult ICU Electrolyte Replacment Protocol based on the criteria listed below:   1.Exclusion criteria: TCTS, ECMO, Dialysis, and Myasthenia Gravis patients 2. Is GFR >/= 30 ml/min? Yes.    Patient's GFR today is >60 3. Is SCr </= 2? Yes.   Patient's SCr is 1.28 mg/dL 4. Did SCr increase >/= 0.5 in 24 hours? No. 5.Pt's weight >40kg  Yes.   6. Abnormal electrolyte(s): phos 2.0, potassium 3.1  7. Electrolytes replaced per protocol 8.  Call MD STAT for K+ </= 2.5, Phos </= 1, or Mag </= 1 Physician:  n/a  Darlys Gales 11/24/2022 5:28 AM

## 2022-11-24 NOTE — Progress Notes (Signed)
Inpatient Diabetes Program Recommendations  AACE/ADA: New Consensus Statement on Inpatient Glycemic Control (2015)  Target Ranges:  Prepandial:   less than 140 mg/dL      Peak postprandial:   less than 180 mg/dL (1-2 hours)      Critically ill patients:  140 - 180 mg/dL   Lab Results  Component Value Date   GLUCAP 177 (H) 11/24/2022   HGBA1C 13.5 (H) 11/21/2022    Review of Glycemic Control  Latest Reference Range & Units 11/24/22 08:56 11/24/22 10:50 11/24/22 13:10  Glucose-Capillary 70 - 99 mg/dL 149 (H) 156 (H) 177 (H)   Diabetes history: DM 2 Outpatient Diabetes medications:  None Current orders for Inpatient glycemic control:  IV insulin  Inpatient Diabetes Program Recommendations:    Note IV insulin needs over the past 8 hour have been approximately 92 units with the last drip rate being 13 units/ hr.  Approximate 24 hours needs would be approximately  276 units (80% would be 220 units/ 24 hour period).  Discussed with pharmacy, and recommend Semglee 55 units bid, and Novolog tube feed coverage 12-15 units q 4 hours with resistant correction.  Unsure why insulin needs are so high.  Will follow.   Thanks,  Adah Perl, RN, BC-ADM Inpatient Diabetes Coordinator Pager [phone]  (8a-5p)

## 2022-11-24 NOTE — Progress Notes (Signed)
eLink Physician-Brief Progress Note Patient Name: [name] [name]. DOB: 02-08-1961 MRN: [id number]   Date of Service  11/24/2022  HPI/Events of Note  Patient spiked a temperature (100.2),  he's had recent cultures, LFT's significant for mild AST elevation only.  eICU Interventions  PRN Tylenol ordered.        Frederik Pear 11/24/2022, 4:37 AM

## 2022-11-25 DIAGNOSIS — E1110 Type 2 diabetes mellitus with ketoacidosis without coma: Secondary | ICD-10-CM | POA: Diagnosis not present

## 2022-11-25 LAB — COMPREHENSIVE METABOLIC PANEL
ALT: 24 U/L (ref 0–44)
AST: 59 U/L — ABNORMAL HIGH (ref 15–41)
Albumin: 1.8 g/dL — ABNORMAL LOW (ref 3.5–5.0)
Alkaline Phosphatase: 125 U/L (ref 38–126)
Anion gap: 6 (ref 5–15)
BUN: 33 mg/dL — ABNORMAL HIGH (ref 8–23)
CO2: 27 mmol/L (ref 22–32)
Calcium: 8.1 mg/dL — ABNORMAL LOW (ref 8.9–10.3)
Chloride: 111 mmol/L (ref 98–111)
Creatinine, Ser: 1.45 mg/dL — ABNORMAL HIGH (ref 0.61–1.24)
GFR, Estimated: 54 mL/min — ABNORMAL LOW (ref >60)
Glucose, Bld: 222 mg/dL — ABNORMAL HIGH (ref 70–99)
Potassium: 4.3 mmol/L (ref 3.5–5.1)
Sodium: 144 mmol/L (ref 135–145)
Total Bilirubin: 0.4 mg/dL (ref 0.3–1.2)
Total Protein: 5.9 g/dL — ABNORMAL LOW (ref 6.5–8.1)

## 2022-11-25 LAB — CULTURE, BLOOD (ROUTINE X 2): Special Requests: ADEQUATE

## 2022-11-25 LAB — CBC
HCT: 37.1 % — ABNORMAL LOW (ref 39.0–52.0)
Hemoglobin: 11.7 g/dL — ABNORMAL LOW (ref 13.0–17.0)
MCH: 28.6 pg (ref 26.0–34.0)
MCHC: 31.5 g/dL (ref 30.0–36.0)
MCV: 90.7 fL (ref 80.0–100.0)
Platelets: 78 10*3/uL — ABNORMAL LOW (ref 150–400)
RBC: 4.09 MIL/uL — ABNORMAL LOW (ref 4.22–5.81)
RDW: 15.3 % (ref 11.5–15.5)
WBC: 9 10*3/uL (ref 4.0–10.5)
nRBC: 0 % (ref 0.0–0.2)

## 2022-11-25 LAB — GLUCOSE, CAPILLARY
Glucose-Capillary: 244 mg/dL — ABNORMAL HIGH (ref 70–99)
Glucose-Capillary: 185 mg/dL — ABNORMAL HIGH (ref 70–99)
Glucose-Capillary: 218 mg/dL — ABNORMAL HIGH (ref 70–99)
Glucose-Capillary: 314 mg/dL — ABNORMAL HIGH (ref 70–99)
Glucose-Capillary: 376 mg/dL — ABNORMAL HIGH (ref 70–99)

## 2022-11-25 MED ORDER — SODIUM CHLORIDE 0.9 % IV SOLN: INTRAVENOUS | Status: DC | PRN

## 2022-11-25 MED ORDER — CHLORHEXIDINE GLUCONATE CLOTH 2 % EX PADS
6.0000 | MEDICATED_PAD | Freq: Every day | CUTANEOUS | Status: DC
  Administered 2022-11-26 – 2022-12-06 (×7): 6 via TOPICAL

## 2022-11-25 MED ORDER — GLUCERNA 1.5 CAL PO LIQD
1000.0000 mL | ORAL | Status: DC
  Administered 2022-11-25 – 2022-11-26 (×3): 1000 mL
  Filled 2022-11-25 (×8): qty 1000

## 2022-11-25 MED ORDER — PROSOURCE TF20 ENFIT COMPATIBL EN LIQD
60.0000 mL | Freq: Every day | ENTERAL | Status: DC
  Administered 2022-11-25 – 2022-11-26 (×2): 60 mL
  Filled 2022-11-25 (×3): qty 60

## 2022-11-25 NOTE — Progress Notes (Signed)
NAME:  [name] [name]., MRN:  [id number], DOB:  22-Nov-1960, LOS: 4 ADMISSION DATE:  11/20/2022, CONSULTATION DATE:  1/13 REFERRING MD:  Dr. Nechama Guard, CHIEF COMPLAINT:  DKA   History of Present Illness:  Patient is a 62 year old male with pertinent PMH of DMT2 controlled by diet, HTN, HLD, previous R CVA with left-sided deficits presents to [facility] ED on 1/13 with AMS.  Per patient's sister patient has been lethargic over the past week.  Patient uses walker at home and had a fall on 1/9.  Patient stated that he was having constipation and was taking Dulcolax suppositories.  Per patient's sister patient has been having multiple episodes of diarrhea over the past week.  States that he was more alert on 1/12 but noticed that he was more sleepy compared to normal.  On 1/13 patient found on floor with confusion.  EMS called and transferred patient to [facility].  Upon arrival to [facility] ED, patient tachycardia 140s found to be in A-fib RVR.  Glucose 1100, AG 29, CO2 14, pH 7.30, beta H > 8.  Patient started on insulin per Endo tool and IV fluids given. Patient confused. Patient temp 94.9 F and WBC 16.  LA 3.2.  Cultures obtained.  Started on broad-spectrum antibiotics.  CXR with no significant findings.  PCCM consulted for ICU admission.  Pertinent ED labs: NA 153, K6.1, creat 5.5, Trop 20  Pending: resp panel, ua/uc, bcx2  Pertinent  Medical History   Past Medical History:  Diagnosis Date   Acute respiratory failure (HCC)    Cerebral edema (HCC)    Diabetes mellitus without complication ([place])    diet controlled- no meds   Dysphagia as late effect of cerebrovascular disease    Fall at home    Hemiparesis affecting left side as late effect of stroke ([place])    Hyperlipidemia    Hypertension    Stroke ([place]) 2016   Tuberculosis    11 yrs ago per pt.- treated with meds per pt.    Significant Hospital Events: Including procedures, antibiotic start and stop dates in addition to other pertinent events   1/13  admitted to [facility] encephalopathic; DKA; PCCM consulted   Interim History / Subjective:   No acute events.  Remains on nasal cannula.  Transitioned off insulin drip  Objective   Blood pressure 127/79, pulse (!) 56, temperature 99.3 F (37.4 C), temperature source Oral, resp. rate 14, weight 119.6 kg, SpO2 96 %.        Intake/Output Summary (Last 24 hours) at 11/25/2022 0949 Last data filed at 11/25/2022 0600 Gross per 24 hour  Intake 2255 ml  Output 2250 ml  Net 5 ml   Filed Weights   11/22/22 0500 11/24/22 0326 11/25/22 0410  Weight: 114 kg 116.8 kg 119.6 kg    Examination: Blood pressure 127/79, pulse (!) 56, temperature 99.3 F (37.4 C), temperature source Oral, resp. rate 14, weight 119.6 kg, SpO2 96 %. Gen:      No acute distress HEENT:  EOMI, sclera anicteric Neck:     No masses; no thyromegaly Lungs:    Clear to auscultation bilaterally; normal respiratory effort CV:         Regular rate and rhythm; no murmurs Abd:      + bowel sounds; soft, non-tender; no palpable masses, no distension Ext:    No edema; adequate peripheral perfusion Skin:      Warm and dry; no rash Neuro: Awake, left arm contracture, left hemiparesis, garbled speech.  Labs/imaging reviewed Significant for BUN/creatinine 33/1.45, hemoglobin 11.7, platelets 78 No new imaging  Resolved Hospital Problem list    Hypernatremia  Assessment & Plan:  Severe sepsis, present on admission Likely secondary to e coli UTI, Proteus bacteremia Staph epidermidis is likely contaminant Narrowed antibiotics to cefazolin on 1/17  DKA HbA1C is 13.5 Continue Semglee, tube feed coverage and SSI  Acute encephalopathy: likely due to above with component of sepsis Hx of R CVA: w/ left sided hemiparesis CT head no acute abnormality; remote R mca infarct w/ vascular stent in right M1 segment Limit sedation medications.  Follow mental status Delirium precautions Ammonia and TSH levels are normal Urine drug screen is  normal  Acute pancreatitis May have passed a biliary stone.  No Etoh per patient Ammonia is normal Supportive care  AKI Monitor labs  New onset Afib RVR: likely in setting of hypovolemia and sepsis Telemetry monitoring  Prolonged qtc Telemetry monitoring Avoid qtc prolonging agents  HTN HLD Home norvasc, statin PRN iv hydralazine for HTN  GERD PPI  MDD Hold fluoxetine given prolonged qtc  Stable for transfer out of ICU and to hospitalist service.  Best Practice (right click and "Reselect all SmartList Selections" daily)   Diet/type: NPO, speech eval today DVT prophylaxis: prophylactic heparin  GI prophylaxis: PPI Lines: Central line, PICC line Foley:  Yes, and it is still needed Code Status:  full code Last date of multidisciplinary goals of care discussion '[]'$ . Daughter Mickey Farber updated on 1/18  Critical care time: NA   Marshell Garfinkel MD [facility] See Amion for pager  If no response to pager , please call [phone] until 7pm After 7:00 pm call Elink  [phone] 11/25/2022, 9:49 AM

## 2022-11-25 NOTE — Progress Notes (Signed)
Patient arrived to [place] room 17 alert and oriented. Cortrak in place. Glucerna running 1.5 '@60mL'$ /hr. Pink arm band on placed on right arm. Tele monitor placed and called in. Bed in lowest position. Call light in reach. Will continue to monitor pt.

## 2022-11-25 NOTE — Evaluation (Signed)
Occupational Therapy Evaluation Patient Details Name: [name] [name]. MRN: [id number] DOB: [birth year] Today's Date: 11/25/2022   History of Present Illness 62 yo male admitted 1/13 after found on the floor at home. Pt with DKA, hypothermic, hypotensive, AFib with RVR. PMhx: CVA with lt hemiplegia, DM, vascular dementia, HTN, HLD   Clinical Impression   PTA, pt lives alone and reports [place] with ADLs/mobility using cane. Pt does endorse difficulty w/ IADL mgmt. Unsure of PLOF accuracy as pt is A&Ox2. Pt presents with baseline L hemiparesis and new deficits in overall strength, endurance and dynamic balance. Pt requires Mod A x 2 for safety in bed mobility and transfers to recliner. Pt requires Mod A for UB ADL and up to Max A for LB ADLs w/ +2 for safety in standing. As pt requiring assist with ADLs though denies any consistent support at home, rec SNF rehab at DC.  SpO2 97% on RA      Recommendations for follow up therapy are one component of a multi-disciplinary discharge planning process, led by the attending physician.  Recommendations may be updated based on patient status, additional functional criteria and insurance authorization.   Follow Up Recommendations  Skilled nursing-short term rehab (<3 hours/day)     Assistance Recommended at Discharge Frequent or constant Supervision/Assistance  Patient can return home with the following A lot of help with walking and/or transfers;A lot of help with bathing/dressing/bathroom;Assistance with cooking/housework;Direct supervision/assist for medications management    Functional Status Assessment  Patient has had a recent decline in their functional status and demonstrates the ability to make significant improvements in function in a reasonable and predictable amount of time.  Equipment Recommendations  Other (comment) (TBD pending progress)    Recommendations for Other Services       Precautions / Restrictions  Precautions Precautions: Fall;Other (comment) Precaution Comments: L UE hemiparesis Restrictions Weight Bearing Restrictions: No      Mobility Bed Mobility Overal bed mobility: Needs Assistance Bed Mobility: Supine to Sit     Supine to sit: Mod assist, +2 for physical assistance, HOB elevated     General bed mobility comments: Assist for BLE to EOB w/ pt assisting some, difficulty reaching fro grab bar. fair amount of assist needed to lift trunk    Transfers Overall transfer level: Needs assistance Equipment used: 2 person hand held assist Transfers: Sit to/from Stand, Bed to chair/wheelchair/BSC Sit to Stand: Min assist, +2 safety/equipment     Step pivot transfers: Mod assist, +2 safety/equipment     General transfer comment: Able to stand fairly easily with Min A 1-2 at bedside. Mod A x 2 for safety in transfer to recliner w/ noted difficulty due to LLE deficits      Balance Overall balance assessment: Needs assistance Sitting-balance support: Feet supported, Single extremity supported Sitting balance-Leahy Scale: Poor Sitting balance - Comments: Min A to min guard w/ R lateral lean Postural control: Right lateral lean Standing balance support: Single extremity supported, During functional activity Standing balance-Leahy Scale: Fair                             ADL either performed or assessed with clinical judgement   ADL Overall ADL's : Needs assistance/impaired Eating/Feeding: Set up   Grooming: Minimal assistance;Sitting Grooming Details (indicate cue type and reason): assist for suction device Upper Body Bathing: Moderate assistance;Sitting   Lower Body Bathing: Moderate assistance;+2 for safety/equipment;Sit to/from stand;Sitting/lateral leans  Upper Body Dressing : Moderate assistance;Sitting   Lower Body Dressing: Maximal assistance;Sit to/from stand;+2 for safety/equipment;Sitting/lateral leans   Toilet Transfer: Moderate assistance;+2  for safety/equipment;Stand-pivot   Toileting- Clothing Manipulation and Hygiene: Moderate assistance;Sitting/lateral lean;Sit to/from stand;+2 for safety/equipment         General ADL Comments: Pt w/ baseline L sided deficits from prior CVA, noted impaired memory/recall so unsure of true PLOF     Vision Ability to See in Adequate Light: 0 Adequate Patient Visual Report: No change from baseline Vision Assessment?: No apparent visual deficits     Perception     Praxis      Pertinent Vitals/Pain Pain Assessment Pain Assessment: No/denies pain     Hand Dominance Right   Extremity/Trunk Assessment Upper Extremity Assessment Upper Extremity Assessment: LUE deficits/detail LUE Deficits / Details: L hand in flexed/contracted position, able to minimally stretch though pt endorses discomfort, has not worn a brace for L hand per pt. elbow AROM WFL; L shoulder WFL to approx 90* LUE Coordination: decreased fine motor   Lower Extremity Assessment Lower Extremity Assessment: Defer to PT evaluation   Cervical / Trunk Assessment Cervical / Trunk Assessment: Kyphotic   Communication Communication Communication: No difficulties   Cognition Arousal/Alertness: Awake/alert Behavior During Therapy: WFL for tasks assessed/performed Overall Cognitive Status: No family/caregiver present to determine baseline cognitive functioning Area of Impairment: Orientation, Memory, Awareness, Problem solving                 Orientation Level: Disoriented to, Time, Situation   Memory: Decreased short-term memory     Awareness: Emergent Problem Solving: Slow processing, Decreased initiation, Difficulty sequencing, Requires verbal cues, Requires tactile cues General Comments: reports being at [facility] hospital, unsure of what month or year it is. and reports "i dont know" when asked about PLOF/questions that pt would likely know. consistent following of commands     General Comments  able to  titrate to RA w/ SpO2 97%. reports dizziness w/ movement though BP WFL    Exercises     Shoulder Instructions      Home Living Family/patient expects to be discharged to:: Private residence Living Arrangements: Alone Available Help at Discharge: Family;Available PRN/intermittently Type of Home: Apartment Home Access: Level entry     Home Layout: One level     Bathroom Shower/Tub: Tub/shower unit;Sponge bathes at baseline   Bathroom Toilet: Standard     Home Equipment: Cane - single point          Prior Functioning/Environment Prior Level of Function : Patient poor historian/Family not available;Needs assist             Mobility Comments: reports using cane at home ADLs Comments: pt reports sponge bathing at baseline, managing ADLs, denies any recent wearing of a L hand brace. reports difficulty with IADLs and OT asked how he had been managing or if anyone assisting and pt reports "i dont know"        OT Problem List: Decreased strength;Decreased activity tolerance;Impaired balance (sitting and/or standing);Decreased cognition;Decreased knowledge of use of DME or AE;Decreased safety awareness;Impaired tone;Impaired UE functional use      OT Treatment/Interventions: Self-care/ADL training;Therapeutic exercise;Energy conservation;DME and/or AE instruction;Therapeutic activities;Patient/family education    OT Goals(Current goals can be found in the care plan section) Acute Rehab OT Goals Patient Stated Goal: get OOB and into chair OT Goal Formulation: With patient Time For Goal Achievement: 12/09/22 Potential to Achieve Goals: Good  OT Frequency: Min 2X/week    Co-evaluation  AM-PAC OT "6 Clicks" Daily Activity     Outcome Measure Help from another person eating meals?: A Little Help from another person taking care of personal grooming?: A Little Help from another person toileting, which includes using toliet, bedpan, or urinal?: A Lot Help  from another person bathing (including washing, rinsing, drying)?: A Lot Help from another person to put on and taking off regular upper body clothing?: A Lot Help from another person to put on and taking off regular lower body clothing?: A Lot 6 Click Score: 14   End of Session Equipment Utilized During Treatment: Gait belt Nurse Communication: Mobility status  Activity Tolerance: Patient tolerated treatment well Patient left: in chair;with call bell/phone within reach;with chair alarm set;with nursing/sitter in room  OT Visit Diagnosis: Other abnormalities of gait and mobility (R26.89);Unsteadiness on feet (R26.81);Muscle weakness (generalized) (M62.81)                Time: 5797-2820 OT Time Calculation (min): 25 min Charges:  OT General Charges $OT Visit: 1 Visit OT Evaluation $OT Eval Moderate Complexity: 1 Mod OT Treatments $Therapeutic Activity: 8-22 mins  Malachy Chamber, OTR/L Acute Rehab Services Office: [phone]   Layla Maw 11/25/2022, 10:42 AM

## 2022-11-25 NOTE — Social Work (Signed)
TOC reviewed pt's chart upon admission to the unit. Current PT recommendation is for SNF. Pt with a cortrak at this time, main barrier to starting placement work up. TOC will continue to follow for needs, please consult as needed.

## 2022-11-25 NOTE — Progress Notes (Signed)
Attempted to call Pts daughter [name] regarding Pt transfer off of ICU to room 6N17. [phone] and [phone] No answer. Will try again after report called

## 2022-11-25 NOTE — Progress Notes (Signed)
Inpatient Diabetes Program Recommendations  AACE/ADA: New Consensus Statement on Inpatient Glycemic Control (2015)  Target Ranges:  Prepandial:   less than 140 mg/dL      Peak postprandial:   less than 180 mg/dL (1-2 hours)      Critically ill patients:  140 - 180 mg/dL   Lab Results  Component Value Date   GLUCAP 185 (H) 11/25/2022   HGBA1C 13.5 (H) 11/21/2022    Review of Glycemic Control  Latest Reference Range & Units 11/25/22 03:21 11/25/22 07:59  Glucose-Capillary 70 - 99 mg/dL 244 (H) 185 (H)   Diabetes history: DM 2 Outpatient Diabetes medications:  None Current orders for Inpatient glycemic control:  Novolog 0-20 units q 4 hours Glucerna 60 ml/hr (changed today) Novolog 12 units q 4 hours Semglee 50 units q 12 hours Inpatient Diabetes Program Recommendations:    Agree with current orders.  Tube feed changed to Glucerna. Consider adding D10 PRN order for when feeds are held. Will follow.   Thanks,  Adah Perl, RN, BC-ADM Inpatient Diabetes Coordinator Pager [phone]  (8a-5p)

## 2022-11-25 NOTE — Progress Notes (Signed)
Speech Language Pathology Treatment: Dysphagia  Patient Details Name: [name] [name]. MRN: [id number] DOB: 01-13-1961 Today's Date: 11/25/2022 Time: 0300-9233 SLP Time Calculation (min) (ACUTE ONLY): 15 min  Assessment / Plan / Recommendation Clinical Impression  Patient seen by SLP for skilled treatment focused on dysphagia goals. He was in recliner when SLP arrived and was agreeable to some PO's. Per RN, he was doing well with ice chips. SLP assessed patient's toleration of thin liquids (water, soda) and solids (graham crackers), puree solids (applesauce). He exhibited instances of immediate as well as delayed coughing with thin liquids as well as solids. Coughing persisted for a couple minutes. As patient has h/o dysphagia and has been on thickened liquids with swallow precautions/strategies in the past (2016) and he is currently exhibiting s/s of penetration/aspiration, SLP recommending initiate PO diet of Dys 1 (puree) solids and nectar thick liquids. SLP will follow for toleration and ability to advance. (Might need objective swallow evaluation: MBS)    HPI HPI: 62 yo male admitted 1/13 after found on the floor at home. Dx acute respiratory failure, severe sepsis, acute encephalopathy, acute pancreatitis.  Pt with DKA, hypothermic, hypotensive, AFib with RVR. PMhx: right CVA with lt hemiplegia, DM, vascular dementia, HTN, HLD. Remote hx of dysphagia after stroke in 2016.      SLP Plan  Continue with current plan of care      Recommendations for follow up therapy are one component of a multi-disciplinary discharge planning process, led by the attending physician.  Recommendations may be updated based on patient status, additional functional criteria and insurance authorization.    Recommendations  Diet recommendations: Dysphagia 1 (puree);Nectar-thick liquid Liquids provided via: Cup;Straw Medication Administration: Whole meds with puree Supervision: Full supervision/cueing for  compensatory strategies;Patient able to self feed Compensations: Minimize environmental distractions;Slow rate;Small sips/bites Postural Changes and/or Swallow Maneuvers: Seated upright 90 degrees                Oral Care Recommendations: Oral care BID Follow Up Recommendations: Skilled nursing-short term rehab (<3 hours/day) Assistance recommended at discharge: Frequent or constant Supervision/Assistance SLP Visit Diagnosis: Dysphagia, unspecified (R13.10) Plan: Continue with current plan of care           Sonia Baller, MA, CCC-SLP Speech Therapy

## 2022-11-25 NOTE — Progress Notes (Signed)
Nutrition Follow-up  DOCUMENTATION CODES:  Not applicable  INTERVENTION:  Tube feeds via Cortrak: Glucerna 1.5 @ 45m/h (1.44L/d) Prosource TF 20 1x/d 1079mfree water q4h per MD Provides 2240 kcal, 139 gm  protein, and 1093 mL free water daily. ([id number] flushes)  NUTRITION DIAGNOSIS:  Inadequate oral intake related to inability to eat as evidenced by NPO status. - remains applicable  GOAL:  Patient will meet greater than or equal to 90% of their needs - progressing, being met with TF  MONITOR:  Diet advancement, Labs, TF tolerance, I & O's  REASON FOR ASSESSMENT:  Other (Comment) (Cortrak List)    ASSESSMENT:  [age]o. male presented to the ED with AMS. PMH includes T2DM, HTN, HLD, CVA with L side deficits, and dysphagia. Pt admitted with DKA, acute encephalopathy, sepsis 2/2 possible UTI, hypernatremia, and AKI.   1/14 - Admitted 1/15 - Cortrak placed (tip stomach) 1/17 - SLP evaluation, NPO  Pt resting in bed at the time of assessment. Continues to be NPO with cortrak tube in place. No longer requiring D5 and Na WNL. Glucose still elevated despite significant doses of insulin. Will adjust TF formula to lower carb to assist with glucose management. Discussed with RN, RPH, and DM coordinator.  Per RN, SLP to re-evaluate today to assess for readiness for PO.   Intake/Output Summary (Last 24 hours) at 11/25/2022 1124 Last data filed at 11/25/2022 1036 Gross per 24 hour  Intake 2255 ml  Output 2700 ml  Net -445 ml  Net IO Since Admission: 9,500.46 mL [11/25/22 1124]  Nutritionally Relevant Medications: Scheduled Meds:  atorvastatin  40 mg Per Tube Daily   free water  100 mL Per Tube Q4H   insulin aspart  0-20 Units Subcutaneous Q4H   insulin aspart  12 Units Subcutaneous Q4H   insulin glargine-yfgn  50 Units Subcutaneous Q12H   pantoprazole (PROTONIX) IV  40 mg Intravenous Daily   Continuous Infusions:  feeding supplement (GLUCERNA 1.5 CAL)     PRN Meds:  docusate, polyethylene glycol  Labs Reviewed: BUN 33, creatinine 1.45 CBG ranges from 155-244 mg/dL over the last 24 hours HgbA1c 13.5% (1/14)  NUTRITION - FOCUSED PHYSICAL EXAM: Flowsheet Row Most Recent Value  Orbital Region No depletion  Upper Arm Region No depletion  Thoracic and Lumbar Region No depletion  Buccal Region No depletion  Temple Region No depletion  Clavicle Bone Region No depletion  Clavicle and Acromion Bone Region No depletion  Scapular Bone Region No depletion  Dorsal Hand No depletion  Patellar Region No depletion  Anterior Thigh Region No depletion  Posterior Calf Region No depletion  Edema (RD Assessment) None  Hair Reviewed  Eyes Reviewed  Mouth Reviewed  Skin Reviewed  Nails Reviewed   Diet Order:   Diet Order             Diet NPO time specified Except for: Ice Chips  Diet effective now                   EDUCATION NEEDS:  Not appropriate for education at this time  Skin:  Skin Assessment: Reviewed RN Assessment  Last BM:  1/15 - type 5  Height:  Ht Readings from Last 1 Encounters:  11/04/22 '6\' 2"'$  (1.88 m)    Weight:  Wt Readings from Last 1 Encounters:  11/25/22 119.6 kg    Ideal Body Weight:  86.4 kg  BMI:  Body mass index is 33.85 kg/m.  Estimated Nutritional Needs:  Kcal:  2200-2400 Protein:  110-125 grams Fluid:  >/= 2 L    Ranell Patrick, RD, LDN Clinical Dietitian RD pager # available in AMION  After hours/weekend pager # available in [facility]

## 2022-11-26 ENCOUNTER — Inpatient Hospital Stay (HOSPITAL_COMMUNITY): Payer: Medicare (Managed Care)

## 2022-11-26 ENCOUNTER — Other Ambulatory Visit (HOSPITAL_COMMUNITY): Payer: Medicare (Managed Care)

## 2022-11-26 DIAGNOSIS — E1110 Type 2 diabetes mellitus with ketoacidosis without coma: Secondary | ICD-10-CM | POA: Diagnosis not present

## 2022-11-26 LAB — CBC
HCT: 35 % — ABNORMAL LOW (ref 39.0–52.0)
Hemoglobin: 11.4 g/dL — ABNORMAL LOW (ref 13.0–17.0)
MCH: 29.4 pg (ref 26.0–34.0)
MCHC: 32.6 g/dL (ref 30.0–36.0)
MCV: 90.2 fL (ref 80.0–100.0)
Platelets: 95 10*3/uL — ABNORMAL LOW (ref 150–400)
RBC: 3.88 MIL/uL — ABNORMAL LOW (ref 4.22–5.81)
RDW: 15.5 % (ref 11.5–15.5)
WBC: 7.6 10*3/uL (ref 4.0–10.5)
nRBC: 0.3 % — ABNORMAL HIGH (ref 0.0–0.2)

## 2022-11-26 LAB — GLUCOSE, CAPILLARY
Glucose-Capillary: 102 mg/dL — ABNORMAL HIGH (ref 70–99)
Glucose-Capillary: 109 mg/dL — ABNORMAL HIGH (ref 70–99)
Glucose-Capillary: 220 mg/dL — ABNORMAL HIGH (ref 70–99)
Glucose-Capillary: 243 mg/dL — ABNORMAL HIGH (ref 70–99)
Glucose-Capillary: 314 mg/dL — ABNORMAL HIGH (ref 70–99)
Glucose-Capillary: 394 mg/dL — ABNORMAL HIGH (ref 70–99)
Glucose-Capillary: 396 mg/dL — ABNORMAL HIGH (ref 70–99)
Glucose-Capillary: 444 mg/dL — ABNORMAL HIGH (ref 70–99)
Glucose-Capillary: 89 mg/dL (ref 70–99)

## 2022-11-26 LAB — BASIC METABOLIC PANEL
Anion gap: 7 (ref 5–15)
BUN: 31 mg/dL — ABNORMAL HIGH (ref 8–23)
CO2: 27 mmol/L (ref 22–32)
Calcium: 8.1 mg/dL — ABNORMAL LOW (ref 8.9–10.3)
Chloride: 115 mmol/L — ABNORMAL HIGH (ref 98–111)
Creatinine, Ser: 1.24 mg/dL (ref 0.61–1.24)
GFR, Estimated: >60 mL/min (ref >60)
Glucose, Bld: 91 mg/dL (ref 70–99)
Potassium: 3.7 mmol/L (ref 3.5–5.1)
Sodium: 149 mmol/L — ABNORMAL HIGH (ref 135–145)

## 2022-11-26 LAB — CULTURE, BLOOD (ROUTINE X 2): Culture: NO GROWTH

## 2022-11-26 MED ORDER — PANTOPRAZOLE SODIUM 40 MG IV SOLR
40.0000 mg | Freq: Every day | INTRAVENOUS | Status: DC
  Administered 2022-11-27 – 2022-12-01 (×5): 40 mg via INTRAVENOUS
  Filled 2022-11-26 (×6): qty 10

## 2022-11-26 MED ORDER — ENOXAPARIN SODIUM 120 MG/0.8ML IJ SOSY
120.0000 mg | PREFILLED_SYRINGE | INTRAMUSCULAR | Status: AC
  Administered 2022-11-26: 120 mg via SUBCUTANEOUS
  Filled 2022-11-26: qty 0.8

## 2022-11-26 MED ORDER — ENOXAPARIN SODIUM 120 MG/0.8ML IJ SOSY
120.0000 mg | PREFILLED_SYRINGE | Freq: Two times a day (BID) | INTRAMUSCULAR | Status: DC
  Administered 2022-11-26 – 2022-11-27 (×3): 120 mg via SUBCUTANEOUS
  Filled 2022-11-26 (×4): qty 0.8

## 2022-11-26 MED ORDER — PANTOPRAZOLE SODIUM 40 MG PO TBEC: 40.0000 mg | DELAYED_RELEASE_TABLET | Freq: Every day | ORAL | Status: DC

## 2022-11-26 MED ORDER — DEXTROSE 5 % IV SOLN: INTRAVENOUS | Status: AC

## 2022-11-26 NOTE — Progress Notes (Signed)
eLink Physician-Brief Progress Note Patient Name: [name] [name]. DOB: July 31, 1961 MRN: [id number]   Date of Service  11/26/2022  HPI/Events of Note  CBG 89 mg / dl at 5 AM, 50 units of Semglee and 12 units of Novolog Insulin is scheduled to be given at this time  even though correction dose Insulin is to be held per protocol since CBG is < 120 mg / dl.  eICU Interventions  Semglee Insulin 50 units and Novolog Insulin 12 units held. CBG at  7 AM, PCCM attending physician to be notified promptly at 7 AM of CBG result, and to determine the appropriate modification to the Insulin dosing regimen based on current CBG at 7 AM.        Wende Crease U Khair Chasteen 11/26/2022, 5:19 AM

## 2022-11-26 NOTE — Plan of Care (Signed)
  Problem: Safety: Goal: Non-violent Restraint(s) Outcome: Progressing   Problem: Education: Goal: Ability to describe self-care measures that may prevent or decrease complications (Diabetes Survival Skills Education) will improve Outcome: Progressing   Problem: Coping: Goal: Ability to adjust to condition or change in health will improve Outcome: Progressing   Problem: Respiratory: Goal: Will regain and/or maintain adequate ventilation Outcome: Progressing   Problem: Coping: Goal: Level of anxiety will decrease Outcome: Progressing   Problem: Pain Managment: Goal: General experience of comfort will improve Outcome: Progressing   Problem: Safety: Goal: Ability to remain free from injury will improve Outcome: Progressing

## 2022-11-26 NOTE — Progress Notes (Signed)
Physical Therapy Treatment Patient Details Name: [name] [name]. MRN: [id number] DOB: January 23, 1961 Today's Date: 11/26/2022   History of Present Illness 62 yo male admitted 1/13 after found on the floor at home. Pt with DKA, hypothermic, hypotensive, AFib with RVR. PMhx: CVA with lt hemiplegia, DM, vascular dementia, HTN, HLD    PT Comments    Pt was seen for mobility on bed with repositioning done first then with ROM assisted to BLE's.  Pt is not moving much this afternoon, appears lethargic but also is not initiating anything without physical assist.  Pt is also requiring a repetitive set of instructions for any movement, meaning frequently having to restart an exercise to regain pt's attention on the task.  Pt is recommended to SNF for strengthening and recovery of independence of transfers, balance and control of LE's to set up movement.  Follow goals for the acute PT plan of care.  Recommendations for follow up therapy are one component of a multi-disciplinary discharge planning process, led by the attending physician.  Recommendations may be updated based on patient status, additional functional criteria and insurance authorization.  Follow Up Recommendations  Skilled nursing-short term rehab (<3 hours/day) Can patient physically be transported by private vehicle: No   Assistance Recommended at Discharge Frequent or constant Supervision/Assistance  Patient can return home with the following Two people to help with walking and/or transfers;A lot of help with bathing/dressing/bathroom;Assistance with cooking/housework;Direct supervision/assist for medications management;Direct supervision/assist for financial management;Assist for transportation;Help with stairs or ramp for entrance   Equipment Recommendations  None recommended by PT (determine in rehab)    Recommendations for Other Services       Precautions / Restrictions Precautions Precautions: Fall;Other (comment) Precaution  Comments: L UE hemiparesis Restrictions Weight Bearing Restrictions: No Other Position/Activity Restrictions: pt is densely weak on LE's     Mobility  Bed Mobility Overal bed mobility: Needs Assistance Bed Mobility: Rolling (repositioning) Rolling: Mod assist         General bed mobility comments: Pt is fairly dependent to move LE's, mod assist to reposition legs and trunk    Transfers                        Ambulation/Gait                   Stairs             Wheelchair Mobility    Modified Rankin (Stroke Patients Only)       Balance                                            Cognition Arousal/Alertness: Awake/alert Behavior During Therapy: WFL for tasks assessed/performed Overall Cognitive Status: No family/caregiver present to determine baseline cognitive functioning Area of Impairment: Problem solving, Awareness, Following commands, Attention                 Orientation Level: Situation Current Attention Level: Selective Memory: Decreased short-term memory, Decreased recall of precautions Following Commands: Follows one step commands with increased time, Follows one step commands inconsistently   Awareness: Intellectual Problem Solving: Slow processing, Requires verbal cues, Requires tactile cues General Comments: repetitive instructions for exercises were done, unable to initiate and loses focus on the task quickly        Exercises General Exercises - Lower Extremity Ankle Circles/Pumps:  AROM, AAROM, 5 reps Quad Sets: AROM, AAROM, 10 reps Gluteal Sets: AROM, 10 reps Heel Slides: AAROM, 10 reps Hip ABduction/ADduction: AAROM, 10 reps Hip Flexion/Marching: AAROM, 10 reps    General Comments General comments (skin integrity, edema, etc.): Pt was assisted to move BLE's with help, noted his physical assist needed was greater on LLE, with poorer follow through on the verbal and tactile cues       Pertinent Vitals/Pain Pain Assessment Pain Assessment: No/denies pain    Home Living                          Prior Function            PT Goals (current goals can now be found in the care plan section) Acute Rehab PT Goals Patient Stated Goal: to get better Potential to Achieve Goals: Good Progress towards PT goals: Not progressing toward goals - comment    Frequency    Min 3X/week      PT Plan Current plan remains appropriate    Co-evaluation              AM-PAC PT "6 Clicks" Mobility   Outcome Measure  Help needed turning from your back to your side while in a flat bed without using bedrails?: A Lot Help needed moving from lying on your back to sitting on the side of a flat bed without using bedrails?: A Lot Help needed moving to and from a bed to a chair (including a wheelchair)?: A Lot Help needed standing up from a chair using your arms (e.g., wheelchair or bedside chair)?: Total Help needed to walk in hospital room?: Total Help needed climbing 3-5 steps with a railing? : Total 6 Click Score: 9    End of Session   Activity Tolerance: Patient limited by fatigue;Other (comment) (attention to the task) Patient left: in bed;with call bell/phone within reach;with bed alarm set Nurse Communication: Mobility status PT Visit Diagnosis: Unsteadiness on feet (R26.81);Muscle weakness (generalized) (M62.81)     Time: 4098-1191 PT Time Calculation (min) (ACUTE ONLY): 26 min  Charges:  $Therapeutic Exercise: 8-22 mins $Therapeutic Activity: 8-22 mins            Ramond Dial 11/26/2022, 3:29 PM  Mee Hives, PT PhD Acute Rehab Dept. Number: [facility]

## 2022-11-26 NOTE — Progress Notes (Signed)
Per Radiology tube feed stopped so that they can for abdominal ultrasound in 4 hours

## 2022-11-26 NOTE — Progress Notes (Signed)
PROGRESS NOTE  [name].  DOB: 12/16/1960  PCP: Sandrea Hughs, NP FAO:[id number]  DOA: 11/20/2022  LOS: 5 days  Hospital Day: 7  Brief narrative: Mahir Prabhakar. is a 62 y.o. male with PMH significant for DM2, HTN, HLD, previous right CVA with left-sided deficits, dysphagia, falls who lives at home and uses walker at baseline. 1/13, patient was brought to the ED from home after he was found on the floor.  Last seen normal was 2 days prior.  Per family, patient had been lethargic for about a week.  They believe it was after he started having diarrhea due to laxative that he uses for chronic constipation.  1/9, patient had a fall at home. Per EMS, at the scene, blood pressure was low at 90/56  In the ED, patient was in A-fib with RVR at 140s, initial blood pressure was low at 78/54 Labs with glucose level 1100, anion gap 29, pH low at 7.3, serum bicarb 14, beta-hydroxybutyrate level elevated to more than 8.  WBC count 16, lactic acid 3.2, sodium level elevated to 153, potassium elevated to 6.9, creatinine elevated 5.5 Patient was started on IV fluid, IV insulin drip per DKA protocol Chest x-ray unremarkable Admitted to ICU Blood culture sent on admission showed multi species growth including Proteus Mirabella's, Staph epidermidis and lactobacillus species. Urine culture showed 50,000 CFU per mL of E. coli 1/19, transferred out to [facility]  Subjective: Patient was seen and examined this morning.  Lying down in bed.  Not in distress.  Had just taken his breakfast.  Also had core track feeding in place. No family at bedside. Chart reviewed In the last 24 hours, no fever, heart rate in 70s, blood pressure up to 150s, breathing on room air Last set of labs from this morning with sodium elevated to 149, hemoglobin 11.4, platelet 95  Assessment and plan: Severe sepsis - POA Multi species bacteremia and UTI On admission, patient elevated WBC count, elevated lactic acid and low blood  pressure.  Met criteria for severe sepsis.   Unclear if the multi species growth in blood and urine are significant  In any case, patient is clinically improving on IV antibiotics.   Currently on IV Ancef.  Continue same  Recent Labs  Lab 11/20/22 1950 11/21/22 0012 11/21/22 0209 11/23/22 0455 11/24/22 0327 11/25/22 0415 11/26/22 0515  WBC 16.0* 15.8* 14.8* 10.3 9.6 9.0 7.6  LATICACIDVEN 3.2* 2.6* 2.3* 1.9  --   --   --    DKA Initial labs as above showing DKA Improved with IV insulin drip, IV fluid and electrolyte management. Subsequently switched to subcu insulin.  Type 2 diabetes mellitus uncontrolled A1c uncontrolled at 13.5 on 11/21/2022 PTA not on meds. Currently on Semglee 50 units twice daily, NovoLog 12 units every 4 hours with sliding scale insulin Recent Labs  Lab 11/26/22 0015 11/26/22 0429 11/26/22 0449 11/26/22 0703 11/26/22 1138  GLUCAP 220* 102* 89 109* 314*   Acute pancreatitis Initial lipase level elevated to more than 1000.   1/14, CT abdomen showed acute interstitial pancreatitis with minimal nonlocalizing peripancreatic fluid, without abscess or free air.  Also showed cholelithiasis without acute cholecystitis Adequately hydrated. Currently on dysphagia diet Will obtain right upper quadrant ultrasound Patient probably had biliary pancreatitis with already passed obstructing gallstone. May benefit from cholecystectomy inpatient versus outpatient. Recent Labs  Lab 11/20/22 1950 11/21/22 0012 11/21/22 6962 11/22/22 9528 11/23/22 0455 11/24/22 0327 11/25/22 0415 11/25/22 0526 11/26/22 0515  AST  15  --  13* 47* 51*  --   --  59*  --   ALT 22  --  '19 21 22  '$ --   --  24  --   ALKPHOS 155*  --  124 113 92  --   --  125  --   BILITOT 1.4*  --  1.5* 0.5 0.5  --   --  0.4  --   BILIDIR  --   --   --  0.1  --   --   --   --   --   IBILI  --   --   --  0.4  --   --   --   --   --   PROT 8.0  --  6.7 6.2* 6.1*  --   --  5.9*  --   ALBUMIN 2.9*  --   2.5* 2.3* 2.1*  --   --  1.8*  --   AMMONIA  --  25  --   --   --   --   --   --   --   INR 1.2  --   --   --   --   --   --   --   --   LIPASE  --  1,092*  --  319*  --   --   --   --   --   PLT 202 159 134*  --  80* 67* 78*  --  95*   AKI Initial creatinine was elevated to 5.5 due to sepsis, DKA, pancreatitis Creatinine gradually improved, normal at 1.24 today.  Continue to monitor Recent Labs    11/21/22 2325 11/22/22 0331 11/22/22 0808 11/22/22 1230 11/22/22 1633 11/23/22 0455 11/23/22 1510 11/24/22 0327 11/25/22 0526 11/26/22 0515  BUN 97* 92* 90* 83* 80* 63* 54* 36* 33* 31*  CREATININE 4.43* 3.87* 3.69* 3.36* 2.75* 2.03* 1.85* 1.28* 1.45* 1.24   Hypernatremia Sodium level was initially elevated and peaked at 163.  Gradually improved with hydration but it started rising up again 2 days ago.  Will start on D5 at a low rate of 50 mill per hour. Recent Labs  Lab 11/21/22 2325 11/22/22 0331 11/22/22 0808 11/22/22 1230 11/22/22 1633 11/23/22 0455 11/23/22 1510 11/24/22 0327 11/25/22 0526 11/26/22 0515  NA 162* 163* 163* 162* 159* 155* 152* 141 144 149*    A-fib with RVR New onset A-fib in the setting of sepsis, hypovolemia PTA not on any AV node blocking agent. Currently in normal sinus rhythm.  CHADSVASC score elevated to 5 (HTN, HLD, DM2, stroke).   I do not see any anticoagulation started while in ICU.  I will start the patient on full dose Lovenox with a plan to switch to oral anticoagulation at discharge.    Essential hypertension PTA on amlodipine 10 mg daily, hydralazine 25 mg 3 times daily, clonidine as needed,  Currently on amlodipine 10 mg daily.  Acute encephalopathy Impaired mentation likely due to combination of sepsis, DKA, pancreatitis and in the background of history of stroke Initial CT head did not show any acute intracranial abnormality Continue to monitor mental status change  Recent right CVA with left-sided deficits and dysphagia HLD CT  scan of head 1/13 showed a large remote right MCA distribution infarct with additional remote lacunar infarct at the left basal ganglia. Patient states, he was taking aspirin 81 mg daily and Lipitor 40 mg daily.  Since has been started on anticoagulation,  no need to resume aspirin.  Continue Lipitor Continue meclizine PRN for dizziness  Dysphagia Currently on tube feeding. Last seen by speech on 1/18.  Dysphagia 1 diet was started.  Prolonged qtc Initial EKG with QTc prolonged to 509 ms probably because of RVR an abnormal electrolytes  Repeat EKG today.    GERD Continue PPI  Major depressive disorder PTA on fluoxetine.  Resume diet once oral intake is ensured  Impaired mobility Has baseline left-sided deficits after the stroke.  Current acute weakness related to acute comorbidities and prolonged hospitalization. PT eval recommended SNF TOC following   Goals of care   Code Status: Full Code     DVT prophylaxis: Full dose Lovenox    Antimicrobials: IV Ancef Fluid: None Consultants: None Family Communication: None at bedside  Scheduled Meds:  amLODipine  10 mg Per Tube Daily   atorvastatin  40 mg Per Tube Daily   Chlorhexidine Gluconate Cloth  6 each Topical Daily   Chlorhexidine Gluconate Cloth  6 each Topical Q0600   feeding supplement (PROSource TF20)  60 mL Per Tube Daily   free water  100 mL Per Tube Q4H   insulin aspart  0-20 Units Subcutaneous Q4H   insulin aspart  12 Units Subcutaneous Q4H   insulin glargine-yfgn  50 Units Subcutaneous Q12H   pantoprazole (PROTONIX) IV  40 mg Intravenous Daily   sodium chloride flush  10-40 mL Intracatheter Q12H    PRN meds: sodium chloride, acetaminophen, dextrose, docusate, hydrALAZINE, metoprolol tartrate, mouth rinse, polyethylene glycol, sodium chloride flush, white petrolatum   Infusions:   sodium chloride 10 mL/hr at 11/26/22 0452    ceFAZolin (ANCEF) IV 2 g (11/26/22 0552)   dextrose     feeding supplement  (GLUCERNA 1.5 CAL) 1,000 mL (11/26/22 0553)    Antimicrobials: Anti-infectives (From admission, onward)    Start     Dose/Rate Route Frequency Ordered Stop   11/24/22 2200  ceFAZolin (ANCEF) IVPB 2g/100 mL premix        2 g 200 mL/hr over 30 Minutes Intravenous Every 8 hours 11/24/22 1443 11/29/22 2359   11/23/22 2200  ceFAZolin (ANCEF) IVPB 2g/100 mL premix  Status:  Discontinued        2 g 200 mL/hr over 30 Minutes Intravenous Every 8 hours 11/23/22 0923 11/24/22 1443   11/21/22 2200  ceFEPIme (MAXIPIME) 2 g in sodium chloride 0.9 % 100 mL IVPB  Status:  Discontinued        2 g 200 mL/hr over 30 Minutes Intravenous Every 24 hours 11/20/22 2257 11/23/22 0923   11/21/22 0800  metroNIDAZOLE (FLAGYL) IVPB 500 mg  Status:  Discontinued        500 mg 100 mL/hr over 60 Minutes Intravenous Every 12 hours 11/21/22 0007 11/22/22 0821   11/20/22 2359  fluconazole (DIFLUCAN) IVPB 200 mg  Status:  Discontinued        200 mg 100 mL/hr over 60 Minutes Intravenous Daily at bedtime 11/20/22 2328 11/22/22 0821   11/20/22 2256  vancomycin variable dose per unstable renal function (pharmacist dosing)  Status:  Discontinued         Does not apply See admin instructions 11/20/22 2256 11/22/22 0821   11/20/22 2030  ceFEPIme (MAXIPIME) 2 g in sodium chloride 0.9 % 100 mL IVPB        2 g 200 mL/hr over 30 Minutes Intravenous  Once 11/20/22 2022 11/20/22 2329   11/20/22 2030  metroNIDAZOLE (FLAGYL) IVPB 500 mg  500 mg 100 mL/hr over 60 Minutes Intravenous  Once 11/20/22 2022 11/20/22 2329   11/20/22 2030  vancomycin (VANCOCIN) IVPB 1000 mg/200 mL premix  Status:  Discontinued        1,000 mg 200 mL/hr over 60 Minutes Intravenous  Once 11/20/22 2022 11/20/22 2024   11/20/22 2030  vancomycin (VANCOREADY) IVPB 2000 mg/400 mL        2,000 mg 200 mL/hr over 120 Minutes Intravenous  Once 11/20/22 2024 11/21/22 0044       Skin assessment:      Diet:  Diet Order             DIET - DYS 1 Room  service appropriate? No; Fluid consistency: Nectar Thick  Diet effective now                   Nutritional status:  Body mass index is 33.68 kg/m.  Nutrition Problem: Inadequate oral intake Etiology: inability to eat Signs/Symptoms: NPO status      Status is: Inpatient Level of care: Med-Surg  Continue in-hospital care because: On core track feeding.  Dispo: The patient is from: Home              Anticipated d/c is to: SNF              Patient currently is not medically stable to d/c.   Difficult to place patient No   Objective: Vitals:   11/26/22 0539 11/26/22 0740  BP: 129/75 (!) 141/72  Pulse: 68 71  Resp: 17 16  Temp: 98.4 F (36.9 C) 99.4 F (37.4 C)  SpO2: 94% 93%    Intake/Output Summary (Last 24 hours) at 11/26/2022 1141 Last data filed at 11/26/2022 0545 Gross per 24 hour  Intake 1440.66 ml  Output 1000 ml  Net 440.66 ml   Filed Weights   11/24/22 0326 11/25/22 0410 11/26/22 0539  Weight: 116.8 kg 119.6 kg 119 kg   Weight change: -0.6 kg Body mass index is 33.68 kg/m.   Physical Exam: General exam: Pleasant, elderly Caucasian male.  Propped up in bed.  Not in distress Skin: No rashes, lesions or ulcers. HEENT: Atraumatic, normocephalic, no obvious bleeding.  Core track feeding tube in place Lungs: Clear to auscultation bilaterally CVS: Regular rate and rhythm, no murmur GI/Abd soft, distended from obesity, nontender, bowel sound present CNS: Alert, awake, oriented to place and person.  Left-sided deficits at baseline Psychiatry: Sad affect Extremities: No pedal edema.  No tenderness  Data Review: I have personally reviewed the laboratory data and studies available.  F/u labs ordered Unresulted Labs (From admission, onward)     Start     Ordered   Unscheduled  CBC with Differential/Platelet  Tomorrow morning,   R       Question:  Specimen collection method  Answer:  IV Team=IV Team collect   11/26/22 1141   Unscheduled  Comprehensive  metabolic panel  Daily,   R     Question:  Specimen collection method  Answer:  IV Team=IV Team collect   11/26/22 1141            Total time spent in review of labs and imaging, patient evaluation, formulation of plan, documentation and communication with family: 92 minutes  Signed, Terrilee Croak, MD [facility] 11/26/2022

## 2022-11-26 NOTE — Progress Notes (Signed)
Patient CBG at 444.  Patient received 20units Novolog for CBG parameters up to 400.  Patient also given per order 50u Semglee. MD made aware, with new orders to stop D5 infusion.

## 2022-11-26 NOTE — Progress Notes (Signed)
ANTICOAGULATION CONSULT NOTE - Initial Consult  Pharmacy Consult for Lovenox Indication: atrial fibrillation  No Known Allergies  Patient Measurements: Weight: 119 kg (262 lb 5.6 oz)  Vital Signs: Temp: 99.4 F (37.4 C) (01/19 0740) Temp Source: Oral (01/19 0740) BP: 141/72 (01/19 0740) Pulse Rate: 71 (01/19 0740)  Labs: Recent Labs    11/24/22 0327 11/25/22 0415 11/25/22 0526 11/26/22 0515  HGB 10.9* 11.7*  --  11.4*  HCT 34.5* 37.1*  --  35.0*  PLT 67* 78*  --  95*  CREATININE 1.28*  --  1.45* 1.24    Estimated Creatinine Clearance: 82.5 mL/min (by C-G formula based on SCr of 1.24 mg/dL).   Medical History: Past Medical History:  Diagnosis Date   Acute respiratory failure ([place])    Cerebral edema ([facility])    Diabetes mellitus without complication ([place])    diet controlled- no meds   Dysphagia as late effect of cerebrovascular disease    Fall at home    Hemiparesis affecting left side as late effect of stroke ([place])    Hyperlipidemia    Hypertension    Stroke ([place]) 2016   Tuberculosis    11 yrs ago per pt.- treated with meds per pt.    Medications:  Medications Prior to Admission  Medication Sig Dispense Refill Last Dose   amLODipine (NORVASC) 10 MG tablet TAKE 1 TABLET (10 MG TOTAL) BY MOUTH DAILY. I10 (Patient taking differently: Take 10 mg by mouth daily.) 90 tablet 3 11/18/2022   atorvastatin (LIPITOR) 80 MG tablet TAKE 1/2 TABLET BY MOUTH EVERY DAY 45 tablet 3 11/18/2022   cloNIDine (CATAPRES) 0.1 MG tablet TAKE 1 TABLET (0.1 MG TOTAL) BY MOUTH EVERY 8 (EIGHT) HOURS AS NEEDED. (Patient taking differently: Take 0.1 mg by mouth every 6 (six) hours as needed (unknown reason).) 270 tablet 2 unk   hydrALAZINE (APRESOLINE) 25 MG tablet Take 1 tablet (25 mg total) by mouth 3 (three) times daily. 270 tablet 1 11/18/2022   ibuprofen (ADVIL) 800 MG tablet Take 800 mg by mouth as needed for mild pain.   unk   meclizine (ANTIVERT) 25 MG tablet Take 1 tablet (25 mg total) by  mouth 3 (three) times daily as needed for dizziness. 30 tablet 0 unk   aspirin 325 MG tablet Take 1 tablet (325 mg total) by mouth daily. (Patient not taking: Reported on 11/22/2022) 30 tablet 0 Not Taking   FLUoxetine (PROZAC) 10 MG capsule TAKE 1 CAPSULE BY MOUTH EVERY DAY (Patient not taking: Reported on 11/22/2022) 90 capsule 1 Not Taking   pantoprazole (PROTONIX) 40 MG tablet Take 1 tablet (40 mg total) by mouth 2 (two) times daily. (Patient not taking: Reported on 11/22/2022) 180 tablet 1 Not Taking    Assessment: 81 yom admitted with sepsis due to UTI and bacteremia. He has new onset Afib with RVR and remote right MCA stroke history. Pharmacy consulted to begin Lovenox for Afib. He had an AKI on admit however Scr is down to 1.24. No bleeding noted, Hgb stable 10-11s, platelets are up to 95 (202 on admit).  Goal of Therapy:  Anti-Xa level 0.6-1 units/ml 4hrs after LMWH dose given Monitor platelets by anticoagulation protocol: Yes   Plan:  Lovenox 120 mg SQ q12h CBC q72h while on Lovenox Plan is to change to oral agent on discharge   Thank you for involving pharmacy in this patient's care.  Renold Genta, PharmD, BCPS Clinical Pharmacist Clinical phone for 11/26/2022 is [phone] 11/26/2022 12:08 PM

## 2022-11-27 ENCOUNTER — Encounter (HOSPITAL_COMMUNITY): Payer: Self-pay | Admitting: Anesthesiology

## 2022-11-27 ENCOUNTER — Encounter (HOSPITAL_COMMUNITY)
Admission: EM | Disposition: A | Discharge status: Skilled Nursing Facility | Payer: Self-pay | Source: Home / Self Care | Attending: Internal Medicine

## 2022-11-27 ENCOUNTER — Encounter (HOSPITAL_COMMUNITY): Payer: Self-pay | Admitting: Pulmonary Disease

## 2022-11-27 DIAGNOSIS — K8010 Calculus of gallbladder with chronic cholecystitis without obstruction: Secondary | ICD-10-CM | POA: Clinically undetermined

## 2022-11-27 DIAGNOSIS — E1110 Type 2 diabetes mellitus with ketoacidosis without coma: Secondary | ICD-10-CM | POA: Diagnosis not present

## 2022-11-27 DIAGNOSIS — K429 Umbilical hernia without obstruction or gangrene: Secondary | ICD-10-CM | POA: Diagnosis present

## 2022-11-27 LAB — CBC WITH DIFFERENTIAL/PLATELET
Abs Immature Granulocytes: 0.05 10*3/uL (ref 0.00–0.07)
Basophils Absolute: 0 10*3/uL (ref 0.0–0.1)
Basophils Relative: 0 %
Eosinophils Absolute: 0.2 10*3/uL (ref 0.0–0.5)
Eosinophils Relative: 3 %
HCT: 33.9 % — ABNORMAL LOW (ref 39.0–52.0)
Hemoglobin: 10.5 g/dL — ABNORMAL LOW (ref 13.0–17.0)
Immature Granulocytes: 1 %
Lymphocytes Relative: 24 %
Lymphs Abs: 1.7 10*3/uL (ref 0.7–4.0)
MCH: 28.8 pg (ref 26.0–34.0)
MCHC: 31 g/dL (ref 30.0–36.0)
MCV: 92.9 fL (ref 80.0–100.0)
Monocytes Absolute: 1.1 10*3/uL — ABNORMAL HIGH (ref 0.1–1.0)
Monocytes Relative: 15 %
Neutro Abs: 4.1 10*3/uL (ref 1.7–7.7)
Neutrophils Relative %: 57 %
Platelets: 126 10*3/uL — ABNORMAL LOW (ref 150–400)
RBC: 3.65 MIL/uL — ABNORMAL LOW (ref 4.22–5.81)
RDW: 15.7 % — ABNORMAL HIGH (ref 11.5–15.5)
WBC: 7.1 10*3/uL (ref 4.0–10.5)
nRBC: 0 % (ref 0.0–0.2)

## 2022-11-27 LAB — GLUCOSE, CAPILLARY
Glucose-Capillary: 111 mg/dL — ABNORMAL HIGH (ref 70–99)
Glucose-Capillary: 113 mg/dL — ABNORMAL HIGH (ref 70–99)
Glucose-Capillary: 121 mg/dL — ABNORMAL HIGH (ref 70–99)
Glucose-Capillary: 123 mg/dL — ABNORMAL HIGH (ref 70–99)
Glucose-Capillary: 124 mg/dL — ABNORMAL HIGH (ref 70–99)
Glucose-Capillary: 131 mg/dL — ABNORMAL HIGH (ref 70–99)
Glucose-Capillary: 149 mg/dL — ABNORMAL HIGH (ref 70–99)
Glucose-Capillary: 157 mg/dL — ABNORMAL HIGH (ref 70–99)
Glucose-Capillary: 216 mg/dL — ABNORMAL HIGH (ref 70–99)
Glucose-Capillary: 96 mg/dL (ref 70–99)

## 2022-11-27 LAB — COMPREHENSIVE METABOLIC PANEL
ALT: 73 U/L — ABNORMAL HIGH (ref 0–44)
AST: 150 U/L — ABNORMAL HIGH (ref 15–41)
Albumin: 1.7 g/dL — ABNORMAL LOW (ref 3.5–5.0)
Alkaline Phosphatase: 315 U/L — ABNORMAL HIGH (ref 38–126)
Anion gap: 10 (ref 5–15)
BUN: 32 mg/dL — ABNORMAL HIGH (ref 8–23)
CO2: 26 mmol/L (ref 22–32)
Calcium: 8.3 mg/dL — ABNORMAL LOW (ref 8.9–10.3)
Chloride: 114 mmol/L — ABNORMAL HIGH (ref 98–111)
Creatinine, Ser: 1.36 mg/dL — ABNORMAL HIGH (ref 0.61–1.24)
GFR, Estimated: 58 mL/min — ABNORMAL LOW (ref >60)
Glucose, Bld: 126 mg/dL — ABNORMAL HIGH (ref 70–99)
Potassium: 3.7 mmol/L (ref 3.5–5.1)
Sodium: 150 mmol/L — ABNORMAL HIGH (ref 135–145)
Total Bilirubin: 0.2 mg/dL — ABNORMAL LOW (ref 0.3–1.2)
Total Protein: 5.9 g/dL — ABNORMAL LOW (ref 6.5–8.1)

## 2022-11-27 LAB — LIPASE, BLOOD: Lipase: 119 U/L — ABNORMAL HIGH (ref 11–51)

## 2022-11-27 SURGERY — LAPAROSCOPIC CHOLECYSTECTOMY
Anesthesia: General

## 2022-11-27 NOTE — Consult Note (Signed)
Reason for Consult: cholelithiasis, biliary pancreatitis, cholecystitis  Referring Physician: Dr. Pietro Cassis, [facility]  [name] [name]. is an 62 y.o. male.   HPI: Patient is a 62 yo male admitted to the medical service on 11/20/2022 with sepsis and DKA.  Likely urinary source.  CT abd 1/14 demonstrated pancreatitis with cholelithiasis.  Lipase >1000.  History of DM2, HTN, CVA with left side weakness and dysphagia.  Improving although still hyperglycemic.  On anticoagulation for new onset Afib with RVR, hx of stroke.  Still on IV Ancef for urosepsis.  USN of abdomen demonstrates cholelithiasis with thick walled gallbladder and sonographic Murphy's sign.  No prior abdominal surgery.  Surgery asked to evaluate to consider cholecystectomy this admission for cholelithiasis, cholecystitis, and biliary pancreatitis.  Past Medical History:  Diagnosis Date   Acute respiratory failure (HCC)    Cerebral edema (HCC)    Diabetes mellitus without complication ([place])    diet controlled- no meds   Dysphagia as late effect of cerebrovascular disease    Fall at home    Hemiparesis affecting left side as late effect of stroke ([place])    Hyperlipidemia    Hypertension    Stroke ([place]) 2016   Tuberculosis    11 yrs ago per pt.- treated with meds per pt.    Past Surgical History:  Procedure Laterality Date   COLONOSCOPY  10/06/2016   ESOPHAGOGASTRODUODENOSCOPY N/A 08/12/2015   Procedure: ESOPHAGOGASTRODUODENOSCOPY (EGD);  Surgeon: Ladene Artist, MD;  Location: [facility] ENDOSCOPY;  Service: Endoscopy;  Laterality: N/A;   feeding tube removed     GASTROSTOMY TUBE PLACEMENT  05-19-15   mechanical thrombectomy angioplasty stenet placement     SKIN GRAFT  1980's   ankle   UPPER GASTROINTESTINAL ENDOSCOPY      Family History  Problem Relation Age of Onset   Alcohol abuse Sister    Alcohol abuse Brother    Alcohol abuse Father    Colon cancer Neg Hx    Colon polyps Neg Hx    Rectal cancer Neg Hx     Stomach cancer Neg Hx    Esophageal cancer Neg Hx     Social History:  reports that he quit smoking about 2 years ago. His smoking use included cigarettes. He smoked an average of .5 packs per day. He has never used smokeless tobacco. He reports that he does not drink alcohol and does not use drugs.  Allergies: No Known Allergies  Medications: I have reviewed the patient's current medications.  Results for orders placed or performed during the hospital encounter of 11/20/22 (from the past 48 hour(s))  Glucose, capillary     Status: Abnormal   Collection Time: 11/25/22  7:59 AM  Result Value Ref Range   Glucose-Capillary 185 (H) 70 - 99 mg/dL    Comment: Glucose reference range applies only to samples taken after fasting for at least 8 hours.  Glucose, capillary     Status: Abnormal   Collection Time: 11/25/22 11:29 AM  Result Value Ref Range   Glucose-Capillary 218 (H) 70 - 99 mg/dL    Comment: Glucose reference range applies only to samples taken after fasting for at least 8 hours.  Glucose, capillary     Status: Abnormal   Collection Time: 11/25/22  5:10 PM  Result Value Ref Range   Glucose-Capillary 314 (H) 70 - 99 mg/dL    Comment: Glucose reference range applies only to samples taken after fasting for at least 8 hours.  Comment 1 Notify RN   Glucose, capillary     Status: Abnormal   Collection Time: 11/25/22  9:42 PM  Result Value Ref Range   Glucose-Capillary 376 (H) 70 - 99 mg/dL    Comment: Glucose reference range applies only to samples taken after fasting for at least 8 hours.  Glucose, capillary     Status: Abnormal   Collection Time: 11/26/22 12:15 AM  Result Value Ref Range   Glucose-Capillary 220 (H) 70 - 99 mg/dL    Comment: Glucose reference range applies only to samples taken after fasting for at least 8 hours.  Glucose, capillary     Status: Abnormal   Collection Time: 11/26/22  4:29 AM  Result Value Ref Range   Glucose-Capillary 102 (H) 70 - 99 mg/dL     Comment: Glucose reference range applies only to samples taken after fasting for at least 8 hours.  Glucose, capillary     Status: None   Collection Time: 11/26/22  4:49 AM  Result Value Ref Range   Glucose-Capillary 89 70 - 99 mg/dL    Comment: Glucose reference range applies only to samples taken after fasting for at least 8 hours.  CBC     Status: Abnormal   Collection Time: 11/26/22  5:15 AM  Result Value Ref Range   WBC 7.6 4.0 - 10.5 K/uL   RBC 3.88 (L) 4.22 - 5.81 MIL/uL   Hemoglobin 11.4 (L) 13.0 - 17.0 g/dL   HCT 35.0 (L) 39.0 - 52.0 %   MCV 90.2 80.0 - 100.0 fL   MCH 29.4 26.0 - 34.0 pg   MCHC 32.6 30.0 - 36.0 g/dL   RDW 15.5 11.5 - 15.5 %   Platelets 95 (L) 150 - 400 K/uL    Comment: REPEATED TO VERIFY   nRBC 0.3 (H) 0.0 - 0.2 %    Comment: Performed at [facility] [address], [place], [place] [address]  Basic metabolic panel     Status: Abnormal   Collection Time: 11/26/22  5:15 AM  Result Value Ref Range   Sodium 149 (H) 135 - 145 mmol/L   Potassium 3.7 3.5 - 5.1 mmol/L   Chloride 115 (H) 98 - 111 mmol/L   CO2 27 22 - 32 mmol/L   Glucose, Bld 91 70 - 99 mg/dL    Comment: Glucose reference range applies only to samples taken after fasting for at least 8 hours.   BUN 31 (H) 8 - 23 mg/dL   Creatinine, Ser 1.24 0.61 - 1.24 mg/dL   Calcium 8.1 (L) 8.9 - 10.3 mg/dL   GFR, Estimated >60 >60 mL/min    Comment: (NOTE) Calculated using the CKD-EPI Creatinine Equation (2021)    Anion gap 7 5 - 15    Comment: Performed at [facility] [address] [place], [place] [address]  Glucose, capillary     Status: Abnormal   Collection Time: 11/26/22  7:03 AM  Result Value Ref Range   Glucose-Capillary 109 (H) 70 - 99 mg/dL    Comment: Glucose reference range applies only to samples taken after fasting for at least 8 hours.   Comment 1 Notify RN   Glucose, capillary     Status: Abnormal   Collection Time: 11/26/22 11:38 AM  Result Value Ref Range    Glucose-Capillary 314 (H) 70 - 99 mg/dL    Comment: Glucose reference range applies only to samples taken after fasting for at least 8 hours.  Comment 1 Notify RN   Glucose, capillary     Status: Abnormal   Collection Time: 11/26/22  4:40 PM  Result Value Ref Range   Glucose-Capillary 444 (H) 70 - 99 mg/dL    Comment: Glucose reference range applies only to samples taken after fasting for at least 8 hours.  Glucose, capillary     Status: Abnormal   Collection Time: 11/26/22  6:41 PM  Result Value Ref Range   Glucose-Capillary 396 (H) 70 - 99 mg/dL    Comment: Glucose reference range applies only to samples taken after fasting for at least 8 hours.  Glucose, capillary     Status: Abnormal   Collection Time: 11/26/22  7:54 PM  Result Value Ref Range   Glucose-Capillary 394 (H) 70 - 99 mg/dL    Comment: Glucose reference range applies only to samples taken after fasting for at least 8 hours.  Glucose, capillary     Status: Abnormal   Collection Time: 11/26/22 10:05 PM  Result Value Ref Range   Glucose-Capillary 243 (H) 70 - 99 mg/dL    Comment: Glucose reference range applies only to samples taken after fasting for at least 8 hours.  Glucose, capillary     Status: Abnormal   Collection Time: 11/27/22 12:00 AM  Result Value Ref Range   Glucose-Capillary 149 (H) 70 - 99 mg/dL    Comment: Glucose reference range applies only to samples taken after fasting for at least 8 hours.  Glucose, capillary     Status: Abnormal   Collection Time: 11/27/22  2:00 AM  Result Value Ref Range   Glucose-Capillary 121 (H) 70 - 99 mg/dL    Comment: Glucose reference range applies only to samples taken after fasting for at least 8 hours.  CBC with Differential/Platelet     Status: Abnormal   Collection Time: 11/27/22  2:50 AM  Result Value Ref Range   WBC 7.1 4.0 - 10.5 K/uL   RBC 3.65 (L) 4.22 - 5.81 MIL/uL   Hemoglobin 10.5 (L) 13.0 - 17.0 g/dL   HCT 33.9 (L) 39.0 - 52.0 %   MCV 92.9 80.0 - 100.0 fL    MCH 28.8 26.0 - 34.0 pg   MCHC 31.0 30.0 - 36.0 g/dL   RDW 15.7 (H) 11.5 - 15.5 %   Platelets 126 (L) 150 - 400 K/uL   nRBC 0.0 0.0 - 0.2 %   Neutrophils Relative % 57 %   Neutro Abs 4.1 1.7 - 7.7 K/uL   Lymphocytes Relative 24 %   Lymphs Abs 1.7 0.7 - 4.0 K/uL   Monocytes Relative 15 %   Monocytes Absolute 1.1 (H) 0.1 - 1.0 K/uL   Eosinophils Relative 3 %   Eosinophils Absolute 0.2 0.0 - 0.5 K/uL   Basophils Relative 0 %   Basophils Absolute 0.0 0.0 - 0.1 K/uL   Immature Granulocytes 1 %   Abs Immature Granulocytes 0.05 0.00 - 0.07 K/uL    Comment: Performed at [facility], [address] [place], [place] [address]  Comprehensive metabolic panel     Status: Abnormal   Collection Time: 11/27/22  2:50 AM  Result Value Ref Range   Sodium 150 (H) 135 - 145 mmol/L   Potassium 3.7 3.5 - 5.1 mmol/L   Chloride 114 (H) 98 - 111 mmol/L   CO2 26 22 - 32 mmol/L   Glucose, Bld 126 (H) 70 - 99 mg/dL    Comment: Glucose reference range applies only to samples  taken after fasting for at least 8 hours.   BUN 32 (H) 8 - 23 mg/dL   Creatinine, Ser 1.36 (H) 0.61 - 1.24 mg/dL   Calcium 8.3 (L) 8.9 - 10.3 mg/dL   Total Protein 5.9 (L) 6.5 - 8.1 g/dL   Albumin 1.7 (L) 3.5 - 5.0 g/dL   AST 150 (H) 15 - 41 U/L   ALT 73 (H) 0 - 44 U/L   Alkaline Phosphatase 315 (H) 38 - 126 U/L   Total Bilirubin 0.2 (L) 0.3 - 1.2 mg/dL   GFR, Estimated 58 (L) >60 mL/min    Comment: (NOTE) Calculated using the CKD-EPI Creatinine Equation (2021)    Anion gap 10 5 - 15    Comment: Performed at [facility], [place] [address], [place], [place] [address]  Glucose, capillary     Status: Abnormal   Collection Time: 11/27/22  4:17 AM  Result Value Ref Range   Glucose-Capillary 123 (H) 70 - 99 mg/dL    Comment: Glucose reference range applies only to samples taken after fasting for at least 8 hours.  Glucose, capillary     Status: None   Collection Time: 11/27/22  6:16 AM  Result Value Ref Range    Glucose-Capillary 96 70 - 99 mg/dL    Comment: Glucose reference range applies only to samples taken after fasting for at least 8 hours.  Glucose, capillary     Status: Abnormal   Collection Time: 11/27/22  7:17 AM  Result Value Ref Range   Glucose-Capillary 131 (H) 70 - 99 mg/dL    Comment: Glucose reference range applies only to samples taken after fasting for at least 8 hours.    US Abdomen Limited RUQ (LIVER/GB)  Result Date: 11/26/2022 CLINICAL DATA:  Follow-up CT EXAM: ULTRASOUND ABDOMEN LIMITED RIGHT UPPER QUADRANT COMPARISON:  CT abdomen and pelvis 11/21/2022 FINDINGS: Gallbladder: The gallbladder is contracted. Gallstones are present. There is gallbladder wall thickening measuring up to 4.4 mm. Positive sonographic Murphy sign noted by sonographer. Common bile duct: Diameter: 3.3 mm Liver: No focal lesion identified. Liver is mildly enlarged. Increase in parenchymal echogenicity. Portal vein is patent on color Doppler imaging with normal direction of blood flow towards the liver. Other: None. IMPRESSION: Cholelithiasis with gallbladder wall thickening and positive sonographic Murphy sign. Findings are concerning for acute cholecystitis. Electronically Signed   By: Ronney Asters M.D.   On: 11/26/2022 20:28    Review of Systems  Constitutional:  Positive for activity change and appetite change.  HENT:  Positive for trouble swallowing.   Eyes: Negative.   Respiratory: Negative.    Cardiovascular:  Positive for palpitations.  Gastrointestinal:  Positive for abdominal pain.  Endocrine: Positive for polyuria.  Genitourinary: Negative.   Musculoskeletal: Negative.   Skin: Negative.   Allergic/Immunologic: Negative.   Neurological:  Positive for weakness.  Hematological: Negative.   Psychiatric/Behavioral:  Positive for dysphoric mood.    Physical Exam  Blood pressure 110/67, pulse 65, temperature 99.2 F (37.3 C), temperature source Oral, resp. rate 18, weight 119.2 kg, SpO2 95  %.  CONSTITUTIONAL: no acute distress; conversant; not moving left side  EYES: Conjunctiva clear and moist; pupils equal bilaterally  NECK: trachea midline; no thyroid nodularity  LUNGS: respiratory effort normal & unlabored; no wheeze; no rales  CV: Irregular, rate controlled  GI: abdomen soft, non-tender; small umbilical hernia; no incisions; no mass  MSK: No deformity; limited motion on left  PSYCH: appropriate affect for situation  LYMPHATIC: no palpable  cervical lymphadenopathy   Assessment/Plan: HD#8 - sepsis, DKA  Cholelithiasis, cholecystitis, resolving biliary pancreatitis  Lipase pending this AM, Tbili 0.2  IV Ancef for UTI Umbilical hernia Sepsis, likely urinary source DKA, DM2  Improving, still hyperglycemic AKI  Improving, Cr 1.24 New onset Afib with RVR  Now in NSR  Lovenox HTN CVA with left sided weakness, dysphagia  Patient seen and examined.  Agree that cholecystectomy would be indicated when medically stable.  Check lipase today.  Continue abx's.  Continue on Lovenox.  Will follow and discuss timing of surgery as medical status improves.  [name], [facility] [place] [facility] Office: [phone]     [name] 11/27/2022, 7:56 AM

## 2022-11-27 NOTE — Progress Notes (Signed)
SLP Cancellation Note  Patient Details Name: Coltan Spinello. MRN: [id number] DOB: [birth year]   Cancelled treatment:       Reason Eval/Treat Not Completed: Per chart and RN, pt NPO due to potential surgery. SLP to f/u as able for treatment of swallow function.      Ellwood Dense, [place], [facility] Office Number: [phone]  Acie Fredrickson 11/27/2022, 12:40 PM

## 2022-11-27 NOTE — Progress Notes (Signed)
PROGRESS NOTE    [name] [name].  EUM:[id number] DOB: 05-27-1961 DOA: 11/20/2022 PCP: Sandrea Hughs, NP   Brief Narrative:  [name] [name]. is a 62 y.o. male with PMH significant for DM2, HTN, HLD, previous right CVA with left-sided deficits, dysphagia, falls who lives at home and uses walker at baseline. 1/13, patient was brought to the ED from home after he was found on the floor.  Last seen normal was 2 days prior.  Per family, patient had been lethargic for about a week.  They believe it was after he started having diarrhea due to laxative that he uses for chronic constipation.  1/9, patient had a fall at home. Per EMS, at the scene, blood pressure was low at 90/56   In the ED, patient was in A-fib with RVR at 140s, initial blood pressure was low at 78/54 Labs with glucose level 1100, anion gap 29, pH low at 7.3, serum bicarb 14, beta-hydroxybutyrate level elevated to more than 8.  WBC count 16, lactic acid 3.2, sodium level elevated to 153, potassium elevated to 6.9, creatinine elevated 5.5 Patient was started on IV fluid, IV insulin drip per DKA protocol Chest x-ray unremarkable Admitted to ICU Blood culture sent on admission showed multi species growth including Proteus Mirabella's, Staph epidermidis and lactobacillus species. Urine culture showed 50,000 CFU per mL of E. coli 1/19, transferred out to [facility]:  Severe sepsis in the setting of Multi species bacteremia and E. coli UTI -On admission, patient elevated WBC count, elevated lactic acid and low blood pressure.  Met criteria for severe sepsis.   -Unclear if the multi species growth in blood and urine are significant. In any case, patient is clinically improving on IV antibiotics.   -Currently on IV Ancef.  Continue same   Acute cholecystitis: Elevated liver enzymes: -Noted on right upper quadrant ultrasound from 1/19. -Possible resolving biliary pancreatitis. -Consulted general surgery.  Plan is  for cholecystectomy when medically stable.  DKA -Initial labs as above showing DKA -Improved with IV insulin drip, IV fluid and electrolyte management.  Gap has been closed now. -Subsequently switched to subcu insulin.   Type 2 diabetes mellitus uncontrolled -A1c uncontrolled at 13.5 on 11/21/2022, not on any medications at home. -Currently on Semglee 50 units twice daily, NovoLog 12 units every 4 hours with sliding scale insulin -Need close follow-up with PCP outpatient  Acute pancreatitis -Initial lipase level elevated to more than 1000.   -1/14, CT abdomen showed acute interstitial pancreatitis with minimal nonlocalizing peripancreatic fluid, without abscess or free air.  Also showed cholelithiasis without acute cholecystitis -Continue IV fluids and as needed pain medications. -Lipase trending down  AKI -Initial creatinine was elevated to 5.5 due to sepsis, DKA, pancreatitis -Creatinine gradually improved,  Continue to monitor  Hypernatremia -Sodium level was initially elevated and peaked at 163.  Gradually improved with hydration but it started rising up again 3 days ago.  Continue on D5 at a low rate of 50 mill per hour.    A-fib with RVR -New onset A-fib in the setting of sepsis, hypovolemia -Currently in normal sinus rhythm.  CHADSVASC score elevated to 5 (HTN, HLD, DM2, stroke).   -On full dose Lovenox.  Will switch to oral anticoagulation at the time of discharge.  Essential hypertension -at home: on amlodipine 10 mg daily, hydralazine 25 mg 3 times daily, clonidine as needed,  -Currently on amlodipine 10 mg daily.   Acute encephalopathy -Impaired mentation likely  due to combination of sepsis, DKA, pancreatitis and in the background of history of stroke -Initial CT head did not show any acute intracranial abnormality -Overall seems to be improving   Recent right CVA with left-sided deficits and dysphagia -CT scan of head 1/13 showed a large remote right MCA  distribution infarct with additional remote lacunar infarct at the left basal ganglia. --Patient states, he was taking aspirin 81 mg daily and Lipitor 40 mg daily.  -Since has been started on anticoagulation, no need to resume aspirin.  Continue Lipitor -Continue meclizine PRN for dizziness   Dysphagia -Currently on tube feeding. -Last seen by speech on 1/18.  Dysphagia 1 diet was started.   Prolonged qtc -Initial EKG with QTc prolonged to 509 ms probably because of RVR an abnormal electrolytes  -Repeat EKG from 1/19 shows no prolonged QT.  Will continue to monitor on telemetry.  Replenish electrolytes as needed  GERD -Continue PPI   Major depressive disorder -on fluoxetine.  Resume diet once oral intake is ensured   Impaired mobility -Has baseline left-sided deficits after the stroke.   -PT eval recommended SNF -TOC following  DVT prophylaxis: Lovenox Code Status: Full code Family Communication:  None present at bedside.  Plan of care discussed with patient in length and he verbalized understanding and agreed with it. Disposition Plan: To be determined  Consultants:  General surgery PCCM  Procedures:  None  Antimicrobials:  Ancef  Status is: Inpatient    Subjective: Patient seen and examined.  Sitting comfortably on the bed.  No family at the bedside.  Not in acute distress.  Has core track feeding in place.  Patient denies any complaints.  Remained afebrile.  No acute events overnight.  Has been tolerating PE tubes without any issues.  Objective: Vitals:   11/26/22 1956 11/27/22 0420 11/27/22 0500 11/27/22 0721  BP: (!) 141/77 (!) 148/72  110/67  Pulse: 69 67  65  Resp: '20 17  18  '$ Temp: 99 F (37.2 C) 98.3 F (36.8 C)  99.2 F (37.3 C)  TempSrc: Oral Axillary  Oral  SpO2: 94% 96%  95%  Weight:   119.2 kg     Intake/Output Summary (Last 24 hours) at 11/27/2022 1048 Last data filed at 11/27/2022 0442 Gross per 24 hour  Intake 538 ml  Output 950 ml  Net  -412 ml   Filed Weights   11/25/22 0410 11/26/22 0539 11/27/22 0500  Weight: 119.6 kg 119 kg 119.2 kg    Examination:  General exam: Appears calm and comfortable, on room air, not in acute distress, has core track feeding tube in place Respiratory system: Clear to auscultation. Respiratory effort normal. Cardiovascular system: S1 & S2 heard, RRR. No JVD, murmurs, rubs, gallops or clicks. No pedal edema. Gastrointestinal system: Abdomen is nondistended, soft and nontender. No organomegaly or masses felt. Normal bowel sounds heard. Central nervous system: Alert and following commands.  Left-sided weakness noted.   Extremities: Symmetric 5 x 5 power. Skin: No rashes, lesions or ulcers Psychiatry: Judgement and insight appear normal. Mood & affect appropriate.    Data Reviewed: I have personally reviewed following labs and imaging studies  CBC: Recent Labs  Lab 11/20/22 1950 11/20/22 2313 11/23/22 0455 11/24/22 0327 11/25/22 0415 11/26/22 0515 11/27/22 0250  WBC 16.0*   < > 10.3 9.6 9.0 7.6 7.1  NEUTROABS 14.5*  --  8.7*  --   --   --  4.1  HGB 15.5   < > 12.1* 10.9* 11.7*  11.4* 10.5*  HCT 51.5   < > 37.4* 34.5* 37.1* 35.0* 33.9*  MCV 96.6   < > 89.7 91.3 90.7 90.2 92.9  PLT 202   < > 80* 67* 78* 95* 126*   < > = values in this interval not displayed.   Basic Metabolic Panel: Recent Labs  Lab 11/21/22 0209 11/21/22 0635 11/22/22 1628 11/22/22 1633 11/23/22 0455 11/23/22 1510 11/24/22 0327 11/25/22 0526 11/26/22 0515 11/27/22 0250  NA 152*   < >  --    < > 155* 152* 141 144 149* 150*  K 6.2*   < >  --    < > 3.4* 4.1 3.1* 4.3 3.7 3.7  CL 113*   < >  --    < > 117* 117* 109 111 115* 114*  CO2 12*   < >  --    < > '26 24 24 27 27 26  '$ GLUCOSE 1,077*   < >  --    < > 235* 180* 379* 222* 91 126*  BUN 106*   < >  --    < > 63* 54* 36* 33* 31* 32*  CREATININE 5.73*   < >  --    < > 2.03* 1.85* 1.28* 1.45* 1.24 1.36*  CALCIUM 8.7*   < >  --    < > 8.2* 8.0* 6.8* 8.1* 8.1*  8.3*  MG 3.5*  --  2.8*  --  2.5* 2.3 2.0  --   --   --   PHOS 7.4*  --  2.5  --  2.3* 3.0 2.0*  --   --   --    < > = values in this interval not displayed.   GFR: Estimated Creatinine Clearance: 75.3 mL/min (A) (by C-G formula based on SCr of 1.36 mg/dL (H)). Liver Function Tests: Recent Labs  Lab 11/21/22 0209 11/22/22 0808 11/23/22 0455 11/25/22 0526 11/27/22 0250  AST 13* 47* 51* 59* 150*  ALT '19 21 22 24 '$ 73*  ALKPHOS 124 113 92 125 315*  BILITOT 1.5* 0.5 0.5 0.4 0.2*  PROT 6.7 6.2* 6.1* 5.9* 5.9*  ALBUMIN 2.5* 2.3* 2.1* 1.8* 1.7*   Recent Labs  Lab 11/21/22 0012 11/22/22 0808 11/27/22 0250  LIPASE 1,092* 319* 119*   Recent Labs  Lab 11/21/22 0012  AMMONIA 25   Coagulation Profile: Recent Labs  Lab 11/20/22 1950  INR 1.2   Cardiac Enzymes: Recent Labs  Lab 11/21/22 0012  CKTOTAL 230   BNP (last 3 results) No results for input(s): "PROBNP" in the last 8760 hours. HbA1C: No results for input(s): "HGBA1C" in the last 72 hours. CBG: Recent Labs  Lab 11/27/22 0000 11/27/22 0200 11/27/22 0417 11/27/22 0616 11/27/22 0717  GLUCAP 149* 121* 123* 96 131*   Lipid Profile: No results for input(s): "CHOL", "HDL", "LDLCALC", "TRIG", "CHOLHDL", "LDLDIRECT" in the last 72 hours. Thyroid Function Tests: No results for input(s): "TSH", "T4TOTAL", "FREET4", "T3FREE", "THYROIDAB" in the last 72 hours. Anemia Panel: No results for input(s): "VITAMINB12", "FOLATE", "FERRITIN", "TIBC", "IRON", "RETICCTPCT" in the last 72 hours. Sepsis Labs: Recent Labs  Lab 11/20/22 1950 11/21/22 0012 11/21/22 0209 11/23/22 0455  LATICACIDVEN 3.2* 2.6* 2.3* 1.9    Recent Results (from the past 240 hour(s))  Blood Culture (routine x 2)     Status: Abnormal   Collection Time: 11/20/22  8:00 PM   Specimen: BLOOD  Result Value Ref Range Status   Specimen Description BLOOD LEFT ANTECUBITAL  Final   Special  Requests   Final    BOTTLES DRAWN AEROBIC AND ANAEROBIC Blood  Culture adequate volume   Culture  Setup Time   Final    AEROBIC BOTTLE ONLY GRAM NEGATIVE RODS GRAM POSITIVE COCCI IN CLUSTERS CRITICAL RESULT CALLED TO, READ BACK BY AND VERIFIED WITH:  C/ PHARMD M. MITCHELL 11/21/22 1715 A. LAFRANCE  GRAM POSITIVE RODS ANAEROBIC BOTTLE ONLY CRITICAL RESULT CALLED TO, READ BACK BY AND VERIFIED WITH: PHARMD AUSTIN.T AT 1203 ON 11/23/2022 BY T.SAAD.    Culture (A)  Final    PROTEUS MIRABILIS STAPHYLOCOCCUS EPIDERMIDIS THE SIGNIFICANCE OF ISOLATING THIS ORGANISM FROM A SINGLE SET OF BLOOD CULTURES WHEN MULTIPLE SETS ARE DRAWN IS UNCERTAIN. PLEASE NOTIFY THE MICROBIOLOGY DEPARTMENT WITHIN ONE WEEK IF SPECIATION AND SENSITIVITIES ARE REQUIRED. LACTOBACILLUS SPECIES Standardized susceptibility testing for this organism is not available. Performed at [facility], [place] [address] [place], [place] [address]    Report Status 11/25/2022 FINAL  Final   Organism ID, Bacteria PROTEUS MIRABILIS  Final      Susceptibility   Proteus mirabilis - MIC*    AMPICILLIN <=2 SENSITIVE Sensitive     CEFAZOLIN <=4 SENSITIVE Sensitive     CEFEPIME <=0.12 SENSITIVE Sensitive     CEFTAZIDIME <=1 SENSITIVE Sensitive     CEFTRIAXONE <=0.25 SENSITIVE Sensitive     CIPROFLOXACIN <=0.25 SENSITIVE Sensitive     GENTAMICIN <=1 SENSITIVE Sensitive     IMIPENEM 2 SENSITIVE Sensitive     TRIMETH/SULFA <=20 SENSITIVE Sensitive     AMPICILLIN/SULBACTAM <=2 SENSITIVE Sensitive     PIP/TAZO <=4 SENSITIVE Sensitive     * PROTEUS MIRABILIS  Blood Culture ID Panel (Reflexed)     Status: Abnormal   Collection Time: 11/20/22  8:00 PM  Result Value Ref Range Status   Enterococcus faecalis NOT DETECTED NOT DETECTED Final   Enterococcus Faecium NOT DETECTED NOT DETECTED Final   Listeria monocytogenes NOT DETECTED NOT DETECTED Final   Staphylococcus species DETECTED (A) NOT DETECTED Final    Comment: CRITICAL RESULT CALLED TO, READ BACK BY AND VERIFIED WITH:  C/ PHARMD M. MITCHELL  11/21/22 1715 A. LAFRANCE     Staphylococcus aureus (BCID) NOT DETECTED NOT DETECTED Final   Staphylococcus epidermidis DETECTED (A) NOT DETECTED Final    Comment: CRITICAL RESULT CALLED TO, READ BACK BY AND VERIFIED WITH:  C/ PHARMD M. MITCHELL 11/21/22 1715 A. LAFRANCE     Staphylococcus lugdunensis NOT DETECTED NOT DETECTED Final   Streptococcus species NOT DETECTED NOT DETECTED Final   Streptococcus agalactiae NOT DETECTED NOT DETECTED Final   Streptococcus pneumoniae NOT DETECTED NOT DETECTED Final   Streptococcus pyogenes NOT DETECTED NOT DETECTED Final   A.calcoaceticus-baumannii NOT DETECTED NOT DETECTED Final   Bacteroides fragilis NOT DETECTED NOT DETECTED Final   Enterobacterales DETECTED (A) NOT DETECTED Final    Comment: Enterobacterales represent a large order of gram negative bacteria, not a single organism. CRITICAL RESULT CALLED TO, READ BACK BY AND VERIFIED WITH:  C/ PHARMD M. MITCHELL 11/21/22 1715 A. LAFRANCE     Enterobacter cloacae complex NOT DETECTED NOT DETECTED Final   Escherichia coli NOT DETECTED NOT DETECTED Final   Klebsiella aerogenes NOT DETECTED NOT DETECTED Final   Klebsiella oxytoca NOT DETECTED NOT DETECTED Final   Klebsiella pneumoniae NOT DETECTED NOT DETECTED Final   Proteus species DETECTED (A) NOT DETECTED Final    Comment: CRITICAL RESULT CALLED TO, READ BACK BY AND VERIFIED WITH:  C/ PHARMD M. MITCHELL 11/21/22 1715 A. LAFRANCE  Salmonella species NOT DETECTED NOT DETECTED Final   Serratia marcescens NOT DETECTED NOT DETECTED Final   Haemophilus influenzae NOT DETECTED NOT DETECTED Final   Neisseria meningitidis NOT DETECTED NOT DETECTED Final   Pseudomonas aeruginosa NOT DETECTED NOT DETECTED Final   Stenotrophomonas maltophilia NOT DETECTED NOT DETECTED Final   Candida albicans NOT DETECTED NOT DETECTED Final   Candida auris NOT DETECTED NOT DETECTED Final   Candida glabrata NOT DETECTED NOT DETECTED Final   Candida krusei NOT  DETECTED NOT DETECTED Final   Candida parapsilosis NOT DETECTED NOT DETECTED Final   Candida tropicalis NOT DETECTED NOT DETECTED Final   Cryptococcus neoformans/gattii NOT DETECTED NOT DETECTED Final   CTX-M ESBL NOT DETECTED NOT DETECTED Final   Carbapenem resistance IMP NOT DETECTED NOT DETECTED Final   Carbapenem resistance KPC NOT DETECTED NOT DETECTED Final   Methicillin resistance mecA/C NOT DETECTED NOT DETECTED Final   Carbapenem resistance NDM NOT DETECTED NOT DETECTED Final   Carbapenem resist OXA 48 LIKE NOT DETECTED NOT DETECTED Final   Carbapenem resistance VIM NOT DETECTED NOT DETECTED Final    Comment: Performed at [facility], [address] [place], [place] [address]  Resp panel by RT-PCR (RSV, Flu A&B, Covid) Anterior Nasal Swab     Status: None   Collection Time: 11/20/22  8:23 PM   Specimen: Anterior Nasal Swab  Result Value Ref Range Status   SARS Coronavirus 2 by RT PCR NEGATIVE NEGATIVE Final    Comment: (NOTE) SARS-CoV-2 target nucleic acids are NOT DETECTED.  The SARS-CoV-2 RNA is generally detectable in upper respiratory specimens during the acute phase of infection. The lowest concentration of SARS-CoV-2 viral copies this assay can detect is 138 copies/mL. A negative result does not preclude SARS-Cov-2 infection and should not be used as the sole basis for treatment or other patient management decisions. A negative result may occur with  improper specimen collection/handling, submission of specimen other than nasopharyngeal swab, presence of viral mutation(s) within the areas targeted by this assay, and inadequate number of viral copies(<138 copies/mL). A negative result must be combined with clinical observations, patient history, and epidemiological information. The expected result is Negative.  Fact Sheet for Patients:  EntrepreneurPulse.com.au  Fact Sheet for Healthcare Providers:   IncredibleEmployment.be  This test is no t yet approved or cleared by the [place] FDA and  has been authorized for detection and/or diagnosis of SARS-CoV-2 by FDA under an Emergency Use Authorization (EUA). This EUA will remain  in effect (meaning this test can be used) for the duration of the COVID-19 declaration under Section 564(b)(1) of the Act, 21 U.S.C.section 360bbb-3(b)(1), unless the authorization is terminated  or revoked sooner.       Influenza A by PCR NEGATIVE NEGATIVE Final   Influenza B by PCR NEGATIVE NEGATIVE Final    Comment: (NOTE) The Xpert Xpress SARS-CoV-2/FLU/RSV plus assay is intended as an aid in the diagnosis of influenza from Nasopharyngeal swab specimens and should not be used as a sole basis for treatment. Nasal washings and aspirates are unacceptable for Xpert Xpress SARS-CoV-2/FLU/RSV testing.  Fact Sheet for Patients: EntrepreneurPulse.com.au  Fact Sheet for Healthcare Providers: IncredibleEmployment.be  This test is not yet approved or cleared by the [place] FDA and has been authorized for detection and/or diagnosis of SARS-CoV-2 by FDA under an Emergency Use Authorization (EUA). This EUA will remain in effect (meaning this test can be used) for the duration of the COVID-19 declaration under Section 564(b)(1) of the  Act, 21 U.S.C. section 360bbb-3(b)(1), unless the authorization is terminated or revoked.     Resp Syncytial Virus by PCR NEGATIVE NEGATIVE Final    Comment: (NOTE) Fact Sheet for Patients: EntrepreneurPulse.com.au  Fact Sheet for Healthcare Providers: IncredibleEmployment.be  This test is not yet approved or cleared by the [place] FDA and has been authorized for detection and/or diagnosis of SARS-CoV-2 by FDA under an Emergency Use Authorization (EUA). This EUA will remain in effect (meaning this test can be used) for  the duration of the COVID-19 declaration under Section 564(b)(1) of the Act, 21 U.S.C. section 360bbb-3(b)(1), unless the authorization is terminated or revoked.  Performed at [facility], [place] [address] [place], [place] [address]   Urine Culture     Status: Abnormal   Collection Time: 11/21/22  2:09 AM   Specimen: Urine, Clean Catch  Result Value Ref Range Status   Specimen Description URINE, CLEAN CATCH  Final   Special Requests   Final    NONE Performed at [facility], [place] [address] [place], [place] [address]    Culture 50,000 COLONIES/mL ESCHERICHIA COLI (A)  Final   Report Status 11/23/2022 FINAL  Final   Organism ID, Bacteria ESCHERICHIA COLI (A)  Final      Susceptibility   Escherichia coli - MIC*    AMPICILLIN >=32 RESISTANT Resistant     CEFAZOLIN <=4 SENSITIVE Sensitive     CEFEPIME <=0.12 SENSITIVE Sensitive     CEFTRIAXONE <=0.25 SENSITIVE Sensitive     CIPROFLOXACIN <=0.25 SENSITIVE Sensitive     GENTAMICIN <=1 SENSITIVE Sensitive     IMIPENEM <=0.25 SENSITIVE Sensitive     NITROFURANTOIN <=16 SENSITIVE Sensitive     TRIMETH/SULFA <=20 SENSITIVE Sensitive     AMPICILLIN/SULBACTAM >=32 RESISTANT Resistant     PIP/TAZO <=4 SENSITIVE Sensitive     * 50,000 COLONIES/mL ESCHERICHIA COLI  MRSA Next Gen by PCR, Nasal     Status: Abnormal   Collection Time: 11/21/22  5:43 AM   Specimen: Nasal Mucosa; Nasal Swab  Result Value Ref Range Status   MRSA by PCR Next Gen DETECTED (A) NOT DETECTED Final    Comment: RESULT CALLED TO, READ BACK BY AND VERIFIED WITH: RN Freda Jackson [id number] AT 0810 BY EC (NOTE) The GeneXpert MRSA Assay (FDA approved for NASAL specimens only), is one component of a comprehensive MRSA colonization surveillance program. It is not intended to diagnose MRSA infection nor to guide or monitor treatment for MRSA infections. Test performance is not FDA approved in patients less than 77 years old. Performed at [facility], [place] [address] [place], [place] [address]   Blood Culture (routine x 2)     Status: None   Collection Time: 11/21/22  7:09 AM   Specimen: BLOOD RIGHT HAND  Result Value Ref Range Status   Specimen Description BLOOD RIGHT HAND  Final   Special Requests   Final    BOTTLES DRAWN AEROBIC ONLY Blood Culture results may not be optimal due to an inadequate volume of blood received in culture bottles   Culture   Final    NO GROWTH 5 DAYS Performed at [facility], [place] [address] [place], [place] [address]    Report Status 11/26/2022 FINAL  Final      Radiology Studies: US Abdomen Limited RUQ (LIVER/GB)  Result Date: 11/26/2022 CLINICAL DATA:  Follow-up CT EXAM: ULTRASOUND ABDOMEN LIMITED RIGHT UPPER QUADRANT COMPARISON:  CT abdomen and pelvis 11/21/2022  FINDINGS: Gallbladder: The gallbladder is contracted. Gallstones are present. There is gallbladder wall thickening measuring up to 4.4 mm. Positive sonographic Murphy sign noted by sonographer. Common bile duct: Diameter: 3.3 mm Liver: No focal lesion identified. Liver is mildly enlarged. Increase in parenchymal echogenicity. Portal vein is patent on color Doppler imaging with normal direction of blood flow towards the liver. Other: None. IMPRESSION: Cholelithiasis with gallbladder wall thickening and positive sonographic Murphy sign. Findings are concerning for acute cholecystitis. Electronically Signed   By: Ronney Asters M.D.   On: 11/26/2022 20:28    Scheduled Meds:  amLODipine  10 mg Per Tube Daily   atorvastatin  40 mg Per Tube Daily   Chlorhexidine Gluconate Cloth  6 each Topical Daily   Chlorhexidine Gluconate Cloth  6 each Topical Q0600   enoxaparin (LOVENOX) injection  120 mg Subcutaneous Q12H   feeding supplement (PROSource TF20)  60 mL Per Tube Daily   free water  100 mL Per Tube Q4H   insulin aspart  0-20 Units Subcutaneous Q4H   insulin aspart  12 Units Subcutaneous Q4H   insulin glargine-yfgn  50 Units Subcutaneous Q12H    pantoprazole (PROTONIX) IV  40 mg Intravenous Daily   sodium chloride flush  10-40 mL Intracatheter Q12H   Continuous Infusions:  sodium chloride 10 mL/hr at 11/26/22 0452    ceFAZolin (ANCEF) IV 2 g (11/27/22 1561)   dextrose Stopped (11/26/22 1738)   feeding supplement (GLUCERNA 1.5 CAL) 1,000 mL (11/26/22 2045)     LOS: 6 days   Time spent: 40 minutes   Sheila Gervasi Loann Quill, MD [facility]  If 7PM-7AM, please contact night-coverage [email or web address] 11/27/2022, 10:48 AM

## 2022-11-28 DIAGNOSIS — E1110 Type 2 diabetes mellitus with ketoacidosis without coma: Secondary | ICD-10-CM | POA: Diagnosis not present

## 2022-11-28 LAB — CBC
HCT: 30.3 % — ABNORMAL LOW (ref 39.0–52.0)
Hemoglobin: 9.3 g/dL — ABNORMAL LOW (ref 13.0–17.0)
MCH: 28.9 pg (ref 26.0–34.0)
MCHC: 30.7 g/dL (ref 30.0–36.0)
MCV: 94.1 fL (ref 80.0–100.0)
Platelets: 136 10*3/uL — ABNORMAL LOW (ref 150–400)
RBC: 3.22 MIL/uL — ABNORMAL LOW (ref 4.22–5.81)
RDW: 15.6 % — ABNORMAL HIGH (ref 11.5–15.5)
WBC: 7.1 10*3/uL (ref 4.0–10.5)
nRBC: 0 % (ref 0.0–0.2)

## 2022-11-28 LAB — COMPREHENSIVE METABOLIC PANEL
ALT: 46 U/L — ABNORMAL HIGH (ref 0–44)
AST: 93 U/L — ABNORMAL HIGH (ref 15–41)
Albumin: 1.9 g/dL — ABNORMAL LOW (ref 3.5–5.0)
Alkaline Phosphatase: 314 U/L — ABNORMAL HIGH (ref 38–126)
Anion gap: 12 (ref 5–15)
BUN: 24 mg/dL — ABNORMAL HIGH (ref 8–23)
CO2: 27 mmol/L (ref 22–32)
Calcium: 8.4 mg/dL — ABNORMAL LOW (ref 8.9–10.3)
Chloride: 110 mmol/L (ref 98–111)
Creatinine, Ser: 1.11 mg/dL (ref 0.61–1.24)
GFR, Estimated: >60 mL/min (ref >60)
Glucose, Bld: 109 mg/dL — ABNORMAL HIGH (ref 70–99)
Potassium: 4 mmol/L (ref 3.5–5.1)
Sodium: 149 mmol/L — ABNORMAL HIGH (ref 135–145)
Total Bilirubin: 0.4 mg/dL (ref 0.3–1.2)
Total Protein: 6.5 g/dL (ref 6.5–8.1)

## 2022-11-28 LAB — PHOSPHORUS: Phosphorus: 3.8 mg/dL (ref 2.5–4.6)

## 2022-11-28 LAB — GLUCOSE, CAPILLARY
Glucose-Capillary: 76 mg/dL (ref 70–99)
Glucose-Capillary: 119 mg/dL — ABNORMAL HIGH (ref 70–99)
Glucose-Capillary: 188 mg/dL — ABNORMAL HIGH (ref 70–99)
Glucose-Capillary: 283 mg/dL — ABNORMAL HIGH (ref 70–99)
Glucose-Capillary: 318 mg/dL — ABNORMAL HIGH (ref 70–99)

## 2022-11-28 LAB — MAGNESIUM: Magnesium: 2 mg/dL (ref 1.7–2.4)

## 2022-11-28 MED ORDER — DEXTROSE 5 % IV SOLN: INTRAVENOUS | Status: DC

## 2022-11-28 MED ORDER — ENOXAPARIN SODIUM 60 MG/0.6ML IJ SOSY
60.0000 mg | PREFILLED_SYRINGE | INTRAMUSCULAR | Status: DC
  Administered 2022-11-28: 60 mg via SUBCUTANEOUS
  Filled 2022-11-28: qty 0.6

## 2022-11-28 NOTE — Progress Notes (Signed)
PROGRESS NOTE    [name] [name].  JJO:[id number] DOB: 1961/08/12 DOA: 11/20/2022 PCP: Sandrea Hughs, NP   Brief Narrative:  [name] Maclin. is a 62 y.o. male with PMH significant for DM2, HTN, HLD, previous right CVA with left-sided deficits, dysphagia, falls who lives at home and uses walker at baseline. 1/13, patient was brought to the ED from home after he was found on the floor.  Last seen normal was 2 days prior.  Per family, patient had been lethargic for about a week.  They believe it was after he started having diarrhea due to laxative that he uses for chronic constipation.  1/9, patient had a fall at home. Per EMS, at the scene, blood pressure was low at 90/56   In the ED, patient was in A-fib with RVR at 140s, initial blood pressure was low at 78/54 Labs with glucose level 1100, anion gap 29, pH low at 7.3, serum bicarb 14, beta-hydroxybutyrate level elevated to more than 8.  WBC count 16, lactic acid 3.2, sodium level elevated to 153, potassium elevated to 6.9, creatinine elevated 5.5 Patient was started on IV fluid, IV insulin drip per DKA protocol Chest x-ray unremarkable Admitted to ICU Blood culture sent on admission showed multi species growth including Proteus Mirabella's, Staph epidermidis and lactobacillus species. Urine culture showed 50,000 CFU per mL of E. coli 1/19, transferred out to [facility]:  Severe sepsis in the setting of Multi species bacteremia and E. coli UTI -On admission, patient elevated WBC count, elevated lactic acid and low blood pressure.  Met criteria for severe sepsis.   -Unclear if the multi species growth in blood and urine are significant. In any case, patient is clinically improving on IV antibiotics.   -Currently on IV Ancef.  Continue same   Acute cholecystitis: Elevated liver enzymes: -Noted on right upper quadrant ultrasound from 1/19. -Possible resolving biliary pancreatitis.  Lipase: 119.  Will repeat tomorrow  a.m. -Consulted general surgery-recommended n.p.o. after midnight.  Possible OR for lap chole tomorrow or on Tuesday. -Resume diet  Acute pancreatitis -Initial lipase level elevated to more than 1000.   -1/14, CT abdomen showed acute interstitial pancreatitis with minimal nonlocalizing peripancreatic fluid, without abscess or free air.  Also showed cholelithiasis without acute cholecystitis -Continue IV fluids and as needed pain medications. -Lipase trending down  DKA -Initial labs  showing DKA -Improved with IV insulin drip, IV fluid and electrolyte management.  Gap has been closed now. -Subsequently switched to subcu insulin.   Type 2 diabetes mellitus uncontrolled -A1c uncontrolled at 13.5 on 11/21/2022, not on any medications at home. -Currently on Semglee 50 units twice daily, NovoLog 12 units every 4 hours with sliding scale insulin -Need close follow-up with PCP outpatient  AKI -Initial creatinine was elevated to 5.5 due to sepsis, DKA, pancreatitis -Creatinine gradually improved,  Continue to monitor  Hypernatremia -Sodium level was initially elevated and peaked at 163.  Slowly improving - continue on D5 at a low rate of 50 mill per hour.  A-fib with RVR -New onset A-fib in the setting of sepsis, hypovolemia -Currently in normal sinus rhythm.  CHADSVASC score elevated to 5 (HTN, HLD, DM2, stroke).   -Patient started on full dose Lovenox.  Will switch to prophylactic dose of Lovenox as per general surgery recommendations  Essential hypertension -at home: on amlodipine 10 mg daily, hydralazine 25 mg 3 times daily, clonidine as needed,  -Currently on amlodipine 10 mg daily.  Acute encephalopathy -Impaired mentation likely due to combination of sepsis, DKA, pancreatitis and in the background of history of stroke -Initial CT head did not show any acute intracranial abnormality -Overall seems to be improving   Recent right CVA with left-sided deficits and dysphagia -CT  scan of head 1/13 showed a large remote right MCA distribution infarct with additional remote lacunar infarct at the left basal ganglia. -Patient states, he was taking aspirin 81 mg daily and Lipitor 40 mg daily.  -Since has been started on anticoagulation, no need to resume aspirin.  Will hold Lipitor due to elevated liver enzymes. -Continue meclizine PRN for dizziness   Dysphagia -was on tube feeding. -Last seen by speech on 1/18.  Dysphagia 1 diet was started.   Prolonged qtc -Initial EKG with QTc prolonged to 509 ms probably because of RVR an abnormal electrolytes  -Repeat EKG from 1/19 shows no prolonged QT.  Will continue to monitor on telemetry.  Replenish electrolytes as needed  GERD -Continue PPI   Major depressive disorder -on fluoxetine.    Normocytic anemia: H&H is trending down.  We will continue to monitor  Thrombocytopenia: Platelet count is improving  Impaired mobility -Has baseline left-sided deficits after the stroke.   -PT eval recommended SNF -TOC following  DVT prophylaxis: Lovenox Code Status: Full code Family Communication:  None present at bedside.  Plan of care discussed with patient in length and he verbalized understanding and agreed with it. Disposition Plan: To be determined  Consultants:  General surgery PCCM  Procedures:  None  Antimicrobials:  Ancef  Status is: Inpatient    Subjective: Patient seen and examined.  Lying comfortably on the bed.  Denies any complaints.  No fever or chills, nausea or vomiting.  No acute events overnight.  Objective: Vitals:   11/27/22 1951 11/28/22 0346 11/28/22 0500 11/28/22 0751  BP: (!) 148/74 130/81  (!) 145/79  Pulse: 65 63  69  Resp: 18   16  Temp: 98.8 F (37.1 C) 99.5 F (37.5 C)  99.6 F (37.6 C)  TempSrc: Oral Oral  Oral  SpO2: 97% 93%  93%  Weight:   120.1 kg     Intake/Output Summary (Last 24 hours) at 11/28/2022 1245 Last data filed at 11/27/2022 1615 Gross per 24 hour  Intake  0 ml  Output 700 ml  Net -700 ml    Filed Weights   11/26/22 0539 11/27/22 0500 11/28/22 0500  Weight: 119 kg 119.2 kg 120.1 kg    Examination:  General exam: Appears calm and comfortable, on room air, not in acute distress respiratory system: Clear to auscultation. Respiratory effort normal. Cardiovascular system: S1 & S2 heard, RRR. No JVD, murmurs, rubs, gallops or clicks. No pedal edema. Gastrointestinal system: Abdomen is nondistended, soft and nontender. No organomegaly or masses felt. Normal bowel sounds heard. Central nervous system: Alert and following commands.  Left-sided weakness noted.   Skin: No rashes, lesions or ulcers Psychiatry: Judgement and insight appear normal. Mood & affect appropriate.    Data Reviewed: I have personally reviewed following labs and imaging studies  CBC: Recent Labs  Lab 11/23/22 0455 11/24/22 0327 11/25/22 0415 11/26/22 0515 11/27/22 0250 11/28/22 0442  WBC 10.3 9.6 9.0 7.6 7.1 7.1  NEUTROABS 8.7*  --   --   --  4.1  --   HGB 12.1* 10.9* 11.7* 11.4* 10.5* 9.3*  HCT 37.4* 34.5* 37.1* 35.0* 33.9* 30.3*  MCV 89.7 91.3 90.7 90.2 92.9 94.1  PLT 80* 67* 78*  95* 126* 136*    Basic Metabolic Panel: Recent Labs  Lab 11/22/22 1628 11/22/22 1633 11/23/22 0455 11/23/22 1510 11/24/22 0327 11/25/22 0526 11/26/22 0515 11/27/22 0250 11/28/22 0716  NA  --    < > 155* 152* 141 144 149* 150* 149*  K  --    < > 3.4* 4.1 3.1* 4.3 3.7 3.7 4.0  CL  --    < > 117* 117* 109 111 115* 114* 110  CO2  --    < > '26 24 24 27 27 26 27  '$ GLUCOSE  --    < > 235* 180* 379* 222* 91 126* 109*  BUN  --    < > 63* 54* 36* 33* 31* 32* 24*  CREATININE  --    < > 2.03* 1.85* 1.28* 1.45* 1.24 1.36* 1.11  CALCIUM  --    < > 8.2* 8.0* 6.8* 8.1* 8.1* 8.3* 8.4*  MG 2.8*  --  2.5* 2.3 2.0  --   --   --  2.0  PHOS 2.5  --  2.3* 3.0 2.0*  --   --   --  3.8   < > = values in this interval not displayed.    GFR: Estimated Creatinine Clearance: 92.6 mL/min (by C-G  formula based on SCr of 1.11 mg/dL). Liver Function Tests: Recent Labs  Lab 11/22/22 0808 11/23/22 0455 11/25/22 0526 11/27/22 0250 11/28/22 0716  AST 47* 51* 59* 150* 93*  ALT '21 22 24 '$ 73* 46*  ALKPHOS 113 92 125 315* 314*  BILITOT 0.5 0.5 0.4 0.2* 0.4  PROT 6.2* 6.1* 5.9* 5.9* 6.5  ALBUMIN 2.3* 2.1* 1.8* 1.7* 1.9*    Recent Labs  Lab 11/22/22 0808 11/27/22 0250  LIPASE 319* 119*    No results for input(s): "AMMONIA" in the last 168 hours.  Coagulation Profile: No results for input(s): "INR", "PROTIME" in the last 168 hours.  Cardiac Enzymes: No results for input(s): "CKTOTAL", "CKMB", "CKMBINDEX", "TROPONINI" in the last 168 hours.  BNP (last 3 results) No results for input(s): "PROBNP" in the last 8760 hours. HbA1C: No results for input(s): "HGBA1C" in the last 72 hours. CBG: Recent Labs  Lab 11/27/22 1950 11/27/22 2356 11/28/22 0347 11/28/22 0748 11/28/22 1118  GLUCAP 157* 113* 76 119* 188*    Lipid Profile: No results for input(s): "CHOL", "HDL", "LDLCALC", "TRIG", "CHOLHDL", "LDLDIRECT" in the last 72 hours. Thyroid Function Tests: No results for input(s): "TSH", "T4TOTAL", "FREET4", "T3FREE", "THYROIDAB" in the last 72 hours. Anemia Panel: No results for input(s): "VITAMINB12", "FOLATE", "FERRITIN", "TIBC", "IRON", "RETICCTPCT" in the last 72 hours. Sepsis Labs: Recent Labs  Lab 11/23/22 0455  LATICACIDVEN 1.9     Recent Results (from the past 240 hour(s))  Blood Culture (routine x 2)     Status: Abnormal   Collection Time: 11/20/22  8:00 PM   Specimen: BLOOD  Result Value Ref Range Status   Specimen Description BLOOD LEFT ANTECUBITAL  Final   Special Requests   Final    BOTTLES DRAWN AEROBIC AND ANAEROBIC Blood Culture adequate volume   Culture  Setup Time   Final    AEROBIC BOTTLE ONLY GRAM NEGATIVE RODS GRAM POSITIVE COCCI IN CLUSTERS CRITICAL RESULT CALLED TO, READ BACK BY AND VERIFIED WITH:  C/ PHARMD M. MITCHELL 11/21/22 1715 A.  [facility]  GRAM POSITIVE RODS ANAEROBIC BOTTLE ONLY CRITICAL RESULT CALLED TO, READ BACK BY AND VERIFIED WITH: PHARMD AUSTIN.T AT 1203 ON 11/23/2022 BY T.SAAD.    Culture (A)  Final    PROTEUS MIRABILIS STAPHYLOCOCCUS EPIDERMIDIS THE SIGNIFICANCE OF ISOLATING THIS ORGANISM FROM A SINGLE SET OF BLOOD CULTURES WHEN MULTIPLE SETS ARE DRAWN IS UNCERTAIN. PLEASE NOTIFY THE MICROBIOLOGY DEPARTMENT WITHIN ONE WEEK IF SPECIATION AND SENSITIVITIES ARE REQUIRED. LACTOBACILLUS SPECIES Standardized susceptibility testing for this organism is not available. Performed at [facility], [place] [address], [place], [place] [address]    Report Status 11/25/2022 FINAL  Final   Organism ID, Bacteria PROTEUS MIRABILIS  Final      Susceptibility   Proteus mirabilis - MIC*    AMPICILLIN <=2 SENSITIVE Sensitive     CEFAZOLIN <=4 SENSITIVE Sensitive     CEFEPIME <=0.12 SENSITIVE Sensitive     CEFTAZIDIME <=1 SENSITIVE Sensitive     CEFTRIAXONE <=0.25 SENSITIVE Sensitive     CIPROFLOXACIN <=0.25 SENSITIVE Sensitive     GENTAMICIN <=1 SENSITIVE Sensitive     IMIPENEM 2 SENSITIVE Sensitive     TRIMETH/SULFA <=20 SENSITIVE Sensitive     AMPICILLIN/SULBACTAM <=2 SENSITIVE Sensitive     PIP/TAZO <=4 SENSITIVE Sensitive     * PROTEUS MIRABILIS  Blood Culture ID Panel (Reflexed)     Status: Abnormal   Collection Time: 11/20/22  8:00 PM  Result Value Ref Range Status   Enterococcus faecalis NOT DETECTED NOT DETECTED Final   Enterococcus Faecium NOT DETECTED NOT DETECTED Final   Listeria monocytogenes NOT DETECTED NOT DETECTED Final   Staphylococcus species DETECTED (A) NOT DETECTED Final    Comment: CRITICAL RESULT CALLED TO, READ BACK BY AND VERIFIED WITH:  C/ PHARMD M. MITCHELL 11/21/22 1715 A. [facility]     Staphylococcus aureus (BCID) NOT DETECTED NOT DETECTED Final   Staphylococcus epidermidis DETECTED (A) NOT DETECTED Final    Comment: CRITICAL RESULT CALLED TO, READ BACK BY AND VERIFIED WITH:  C/  PHARMD M. MITCHELL 11/21/22 1715 A. [place]     Staphylococcus lugdunensis NOT DETECTED NOT DETECTED Final   Streptococcus species NOT DETECTED NOT DETECTED Final   Streptococcus agalactiae NOT DETECTED NOT DETECTED Final   Streptococcus pneumoniae NOT DETECTED NOT DETECTED Final   Streptococcus pyogenes NOT DETECTED NOT DETECTED Final   A.calcoaceticus-baumannii NOT DETECTED NOT DETECTED Final   Bacteroides fragilis NOT DETECTED NOT DETECTED Final   Enterobacterales DETECTED (A) NOT DETECTED Final    Comment: Enterobacterales represent a large order of gram negative bacteria, not a single organism. CRITICAL RESULT CALLED TO, READ BACK BY AND VERIFIED WITH:  C/ PHARMD M. MITCHELL 11/21/22 1715 A. LA[place]     Enterobacter cloacae complex NOT DETECTED NOT DETECTED Final   Escherichia coli NOT DETECTED NOT DETECTED Final   Klebsiella aerogenes NOT DETECTED NOT DETECTED Final   Klebsiella oxytoca NOT DETECTED NOT DETECTED Final   Klebsiella pneumoniae NOT DETECTED NOT DETECTED Final   Proteus species DETECTED (A) NOT DETECTED Final    Comment: CRITICAL RESULT CALLED TO, READ BACK BY AND VERIFIED WITH:  C/ PHARMD M. MITCHELL 11/21/22 1715 A. [place]     Salmonella species NOT DETECTED NOT DETECTED Final   Serratia marcescens NOT DETECTED NOT DETECTED Final   Haemophilus influenzae NOT DETECTED NOT DETECTED Final   Neisseria meningitidis NOT DETECTED NOT DETECTED Final   Pseudomonas aeruginosa NOT DETECTED NOT DETECTED Final   Stenotrophomonas maltophilia NOT DETECTED NOT DETECTED Final   Candida albicans NOT DETECTED NOT DETECTED Final   Candida auris NOT DETECTED NOT DETECTED Final   Candida glabrata NOT DETECTED NOT DETECTED Final   Candida krusei NOT DETECTED NOT DETECTED Final  Candida parapsilosis NOT DETECTED NOT DETECTED Final   Candida tropicalis NOT DETECTED NOT DETECTED Final   Cryptococcus neoformans/gattii NOT DETECTED NOT DETECTED Final   CTX-M ESBL NOT DETECTED NOT  DETECTED Final   Carbapenem resistance IMP NOT DETECTED NOT DETECTED Final   Carbapenem resistance KPC NOT DETECTED NOT DETECTED Final   Methicillin resistance mecA/C NOT DETECTED NOT DETECTED Final   Carbapenem resistance NDM NOT DETECTED NOT DETECTED Final   Carbapenem resist OXA 48 LIKE NOT DETECTED NOT DETECTED Final   Carbapenem resistance VIM NOT DETECTED NOT DETECTED Final    Comment: Performed at [facility], [place] [address] [place], [place] [address]  Resp panel by RT-PCR (RSV, Flu A&B, Covid) Anterior Nasal Swab     Status: None   Collection Time: 11/20/22  8:23 PM   Specimen: Anterior Nasal Swab  Result Value Ref Range Status   SARS Coronavirus 2 by RT PCR NEGATIVE NEGATIVE Final    Comment: (NOTE) SARS-CoV-2 target nucleic acids are NOT DETECTED.  The SARS-CoV-2 RNA is generally detectable in upper respiratory specimens during the acute phase of infection. The lowest concentration of SARS-CoV-2 viral copies this assay can detect is 138 copies/mL. A negative result does not preclude SARS-Cov-2 infection and should not be used as the sole basis for treatment or other patient management decisions. A negative result may occur with  improper specimen collection/handling, submission of specimen other than nasopharyngeal swab, presence of viral mutation(s) within the areas targeted by this assay, and inadequate number of viral copies(<138 copies/mL). A negative result must be combined with clinical observations, patient history, and epidemiological information. The expected result is Negative.  Fact Sheet for Patients:  EntrepreneurPulse.com.au  Fact Sheet for Healthcare Providers:  IncredibleEmployment.be  This test is no t yet approved or cleared by the [place] FDA and  has been authorized for detection and/or diagnosis of SARS-CoV-2 by FDA under an Emergency Use Authorization (EUA). This EUA will remain  in effect (meaning  this test can be used) for the duration of the COVID-19 declaration under Section 564(b)(1) of the Act, 21 U.S.C.section 360bbb-3(b)(1), unless the authorization is terminated  or revoked sooner.       Influenza A by PCR NEGATIVE NEGATIVE Final   Influenza B by PCR NEGATIVE NEGATIVE Final    Comment: (NOTE) The Xpert Xpress SARS-CoV-2/FLU/RSV plus assay is intended as an aid in the diagnosis of influenza from Nasopharyngeal swab specimens and should not be used as a sole basis for treatment. Nasal washings and aspirates are unacceptable for Xpert Xpress SARS-CoV-2/FLU/RSV testing.  Fact Sheet for Patients: EntrepreneurPulse.com.au  Fact Sheet for Healthcare Providers: IncredibleEmployment.be  This test is not yet approved or cleared by the [place] FDA and has been authorized for detection and/or diagnosis of SARS-CoV-2 by FDA under an Emergency Use Authorization (EUA). This EUA will remain in effect (meaning this test can be used) for the duration of the COVID-19 declaration under Section 564(b)(1) of the Act, 21 U.S.C. section 360bbb-3(b)(1), unless the authorization is terminated or revoked.     Resp Syncytial Virus by PCR NEGATIVE NEGATIVE Final    Comment: (NOTE) Fact Sheet for Patients: EntrepreneurPulse.com.au  Fact Sheet for Healthcare Providers: IncredibleEmployment.be  This test is not yet approved or cleared by the [place] FDA and has been authorized for detection and/or diagnosis of SARS-CoV-2 by FDA under an Emergency Use Authorization (EUA). This EUA will remain in effect (meaning this test can be used) for the duration of the  COVID-19 declaration under Section 564(b)(1) of the Act, 21 U.S.C. section 360bbb-3(b)(1), unless the authorization is terminated or revoked.  Performed at [facility], [place] [address] [place], [place] [address]   Urine Culture     Status:  Abnormal   Collection Time: 11/21/22  2:09 AM   Specimen: Urine, Clean Catch  Result Value Ref Range Status   Specimen Description URINE, CLEAN CATCH  Final   Special Requests   Final    NONE Performed at [facility], [place] [address] [place], [place] [address]    Culture 50,000 COLONIES/mL ESCHERICHIA COLI (A)  Final   Report Status 11/23/2022 FINAL  Final   Organism ID, Bacteria ESCHERICHIA COLI (A)  Final      Susceptibility   Escherichia coli - MIC*    AMPICILLIN >=32 RESISTANT Resistant     CEFAZOLIN <=4 SENSITIVE Sensitive     CEFEPIME <=0.12 SENSITIVE Sensitive     CEFTRIAXONE <=0.25 SENSITIVE Sensitive     CIPROFLOXACIN <=0.25 SENSITIVE Sensitive     GENTAMICIN <=1 SENSITIVE Sensitive     IMIPENEM <=0.25 SENSITIVE Sensitive     NITROFURANTOIN <=16 SENSITIVE Sensitive     TRIMETH/SULFA <=20 SENSITIVE Sensitive     AMPICILLIN/SULBACTAM >=32 RESISTANT Resistant     PIP/TAZO <=4 SENSITIVE Sensitive     * 50,000 COLONIES/mL ESCHERICHIA COLI  MRSA Next Gen by PCR, Nasal     Status: Abnormal   Collection Time: 11/21/22  5:43 AM   Specimen: Nasal Mucosa; Nasal Swab  Result Value Ref Range Status   MRSA by PCR Next Gen DETECTED (A) NOT DETECTED Final    Comment: RESULT CALLED TO, READ BACK BY AND VERIFIED WITH: RN Freda Jackson [phone] AT 0810 BY EC (NOTE) The GeneXpert MRSA Assay (FDA approved for NASAL specimens only), is one component of a comprehensive MRSA colonization surveillance program. It is not intended to diagnose MRSA infection nor to guide or monitor treatment for MRSA infections. Test performance is not FDA approved in patients less than 30 years old. Performed at [facility], [place] [address] [place], [place] [address]   Blood Culture (routine x 2)     Status: None   Collection Time: 11/21/22  7:09 AM   Specimen: BLOOD RIGHT HAND  Result Value Ref Range Status   Specimen Description BLOOD RIGHT HAND  Final   Special Requests   Final     BOTTLES DRAWN AEROBIC ONLY Blood Culture results may not be optimal due to an inadequate volume of blood received in culture bottles   Culture   Final    NO GROWTH 5 DAYS Performed at [facility], [place] [address] [place], [place] [address]    Report Status 11/26/2022 FINAL  Final      Radiology Studies: US Abdomen Limited RUQ (LIVER/GB)  Result Date: 11/26/2022 CLINICAL DATA:  Follow-up CT EXAM: ULTRASOUND ABDOMEN LIMITED RIGHT UPPER QUADRANT COMPARISON:  CT abdomen and pelvis 11/21/2022 FINDINGS: Gallbladder: The gallbladder is contracted. Gallstones are present. There is gallbladder wall thickening measuring up to 4.4 mm. Positive sonographic Murphy sign noted by sonographer. Common bile duct: Diameter: 3.3 mm Liver: No focal lesion identified. Liver is mildly enlarged. Increase in parenchymal echogenicity. Portal vein is patent on color Doppler imaging with normal direction of blood flow towards the liver. Other: None. IMPRESSION: Cholelithiasis with gallbladder wall thickening and positive sonographic Murphy sign. Findings are concerning for acute cholecystitis. Electronically Signed   By: Tina Griffiths.D.  On: 11/26/2022 20:28    Scheduled Meds:  amLODipine  10 mg Per Tube Daily   atorvastatin  40 mg Per Tube Daily   Chlorhexidine Gluconate Cloth  6 each Topical Daily   Chlorhexidine Gluconate Cloth  6 each Topical Q0600   enoxaparin (LOVENOX) injection  60 mg Subcutaneous Q24H   feeding supplement (PROSource TF20)  60 mL Per Tube Daily   free water  100 mL Per Tube Q4H   insulin aspart  0-20 Units Subcutaneous Q4H   insulin aspart  12 Units Subcutaneous Q4H   insulin glargine-yfgn  50 Units Subcutaneous Q12H   pantoprazole (PROTONIX) IV  40 mg Intravenous Daily   sodium chloride flush  10-40 mL Intracatheter Q12H   Continuous Infusions:  sodium chloride 10 mL/hr at 11/26/22 0452    ceFAZolin (ANCEF) IV 2 g (11/28/22 0630)   dextrose 75 mL/hr at 11/28/22 0500   feeding  supplement (GLUCERNA 1.5 CAL) Stopped (11/28/22 0035)     LOS: 7 days   Time spent: 40 minutes   Tiyon Sanor Loann Quill, MD [facility]  If 7PM-7AM, please contact night-coverage [email or web address] 11/28/2022, 12:45 PM

## 2022-11-28 NOTE — Progress Notes (Signed)
Assessment & Plan: HD#9 - sepsis, DKA   Cholelithiasis, cholecystitis, resolving biliary pancreatitis             Lipase 119 yesterday, repeat in AM 1/22             IV Ancef for UTI Umbilical hernia Sepsis, likely urinary source DKA, DM2             Improving, glucose 119 this AM AKI             Improving, Cr 1.24 yesterday New onset Afib with RVR             Now in NSR             Lovenox - change to prophylaxis dosage HTN CVA Residual left sided weakness, dysphagia   [facility].  Allow po diet today, NPO after midnight.  Will repeat lipase in AM.  Possible OR for lap chole tomorrow or Tuesday.  Rounding team to assess in AM.        [name] [name], [facility] [place] [facility] Office: [phone]        Chief Complaint: Sepsis, biliary pancreatitis  Subjective: Patient in bed, responsive.  Denies pain.  Objective: Vital signs in last 24 hours: Temp:  [98.8 F (37.1 C)-99.6 F (37.6 C)] 99.6 F (37.6 C) (01/21 0751) Pulse Rate:  [63-85] 69 (01/21 0751) Resp:  [16-18] 16 (01/21 0751) BP: (130-148)/(74-81) 145/79 (01/21 0751) SpO2:  [93 %-99 %] 93 % (01/21 0751) Weight:  [120.1 kg] 120.1 kg (01/21 0500) Last BM Date : 11/28/22  Intake/Output from previous day: 01/20 0701 - 01/21 0700 In: 0  Out: 700 [Urine:700] Intake/Output this shift: No intake/output data recorded.  Physical Exam: HEENT - sclerae clear, mucous membranes moist Neck - soft Abdomen - soft, non-tender; no mass; small umbilical hernia   Lab Results:  Recent Labs    11/27/22 0250 11/28/22 0442  WBC 7.1 7.1  HGB 10.5* 9.3*  HCT 33.9* 30.3*  PLT 126* 136*   BMET Recent Labs    11/27/22 0250 11/28/22 0716  NA 150* 149*  K 3.7 4.0  CL 114* 110  CO2 26 27  GLUCOSE 126* 109*  BUN 32* 24*  CREATININE 1.36* 1.11  CALCIUM 8.3* 8.4*   PT/INR No results for input(s): "LABPROT", "INR" in the last 72 hours. Comprehensive  Metabolic Panel:    Component Value Date/Time   NA 149 (H) 11/28/2022 0716   NA 150 (H) 11/27/2022 0250   NA 142 05/07/2016 1134   NA 144 01/28/2016 0946   K 4.0 11/28/2022 0716   K 3.7 11/27/2022 0250   CL 110 11/28/2022 0716   CL 114 (H) 11/27/2022 0250   CO2 27 11/28/2022 0716   CO2 26 11/27/2022 0250   BUN 24 (H) 11/28/2022 0716   BUN 32 (H) 11/27/2022 0250   BUN 13 05/07/2016 1134   BUN 17 01/28/2016 0946   CREATININE 1.11 11/28/2022 0716   CREATININE 1.36 (H) 11/27/2022 0250   CREATININE 1.06 06/21/2022 0901   CREATININE 1.14 12/15/2021 0820   GLUCOSE 109 (H) 11/28/2022 0716   GLUCOSE 126 (H) 11/27/2022 0250   CALCIUM 8.4 (L) 11/28/2022 0716   CALCIUM 8.3 (L) 11/27/2022 0250   AST 93 (H) 11/28/2022 0716   AST 150 (H) 11/27/2022 0250   ALT 46 (H) 11/28/2022 0716   ALT 73 (H) 11/27/2022 0250   ALKPHOS 314 (H) 11/28/2022 8502  ALKPHOS 315 (H) 11/27/2022 0250   BILITOT 0.4 11/28/2022 0716   BILITOT 0.2 (L) 11/27/2022 0250   BILITOT 0.2 01/28/2016 0946   PROT 6.5 11/28/2022 0716   PROT 5.9 (L) 11/27/2022 0250   PROT 7.3 01/28/2016 0946   ALBUMIN 1.9 (L) 11/28/2022 0716   ALBUMIN 1.7 (L) 11/27/2022 0250   ALBUMIN 4.5 01/28/2016 0946    Studies/Results: US Abdomen Limited RUQ (LIVER/GB)  Result Date: 11/26/2022 CLINICAL DATA:  Follow-up CT EXAM: ULTRASOUND ABDOMEN LIMITED RIGHT UPPER QUADRANT COMPARISON:  CT abdomen and pelvis 11/21/2022 FINDINGS: Gallbladder: The gallbladder is contracted. Gallstones are present. There is gallbladder wall thickening measuring up to 4.4 mm. Positive sonographic Murphy sign noted by sonographer. Common bile duct: Diameter: 3.3 mm Liver: No focal lesion identified. Liver is mildly enlarged. Increase in parenchymal echogenicity. Portal vein is patent on color Doppler imaging with normal direction of blood flow towards the liver. Other: None. IMPRESSION: Cholelithiasis with gallbladder wall thickening and positive sonographic Murphy sign.  Findings are concerning for acute cholecystitis. Electronically Signed   By: Ronney Asters M.D.   On: 11/26/2022 20:28      [name] [name] 11/28/2022  Patient ID: [name] [name]., male   DOB: May 02, 1961, 62 y.o.   MRN: [id number]

## 2022-11-29 DIAGNOSIS — E1110 Type 2 diabetes mellitus with ketoacidosis without coma: Secondary | ICD-10-CM | POA: Diagnosis not present

## 2022-11-29 LAB — COMPREHENSIVE METABOLIC PANEL
ALT: 32 U/L (ref 0–44)
AST: 49 U/L — ABNORMAL HIGH (ref 15–41)
Albumin: 1.7 g/dL — ABNORMAL LOW (ref 3.5–5.0)
Alkaline Phosphatase: 248 U/L — ABNORMAL HIGH (ref 38–126)
Anion gap: 7 (ref 5–15)
BUN: 16 mg/dL (ref 8–23)
CO2: 26 mmol/L (ref 22–32)
Calcium: 8.2 mg/dL — ABNORMAL LOW (ref 8.9–10.3)
Chloride: 109 mmol/L (ref 98–111)
Creatinine, Ser: 0.88 mg/dL (ref 0.61–1.24)
GFR, Estimated: >60 mL/min (ref >60)
Glucose, Bld: 81 mg/dL (ref 70–99)
Potassium: 3.4 mmol/L — ABNORMAL LOW (ref 3.5–5.1)
Sodium: 142 mmol/L (ref 135–145)
Total Bilirubin: 0.3 mg/dL (ref 0.3–1.2)
Total Protein: 6.1 g/dL — ABNORMAL LOW (ref 6.5–8.1)

## 2022-11-29 LAB — CBC
HCT: 31.7 % — ABNORMAL LOW (ref 39.0–52.0)
Hemoglobin: 10.5 g/dL — ABNORMAL LOW (ref 13.0–17.0)
MCH: 29.8 pg (ref 26.0–34.0)
MCHC: 33.1 g/dL (ref 30.0–36.0)
MCV: 90.1 fL (ref 80.0–100.0)
Platelets: 159 10*3/uL (ref 150–400)
RBC: 3.52 MIL/uL — ABNORMAL LOW (ref 4.22–5.81)
RDW: 14.7 % (ref 11.5–15.5)
WBC: 8.6 10*3/uL (ref 4.0–10.5)
nRBC: 0 % (ref 0.0–0.2)

## 2022-11-29 LAB — GLUCOSE, CAPILLARY
Glucose-Capillary: 141 mg/dL — ABNORMAL HIGH (ref 70–99)
Glucose-Capillary: 154 mg/dL — ABNORMAL HIGH (ref 70–99)
Glucose-Capillary: 155 mg/dL — ABNORMAL HIGH (ref 70–99)
Glucose-Capillary: 204 mg/dL — ABNORMAL HIGH (ref 70–99)
Glucose-Capillary: 238 mg/dL — ABNORMAL HIGH (ref 70–99)
Glucose-Capillary: 281 mg/dL — ABNORMAL HIGH (ref 70–99)
Glucose-Capillary: 352 mg/dL — ABNORMAL HIGH (ref 70–99)
Glucose-Capillary: 61 mg/dL — ABNORMAL LOW (ref 70–99)

## 2022-11-29 LAB — LIPASE, BLOOD: Lipase: 79 U/L — ABNORMAL HIGH (ref 11–51)

## 2022-11-29 MED ORDER — POTASSIUM CHLORIDE CRYS ER 20 MEQ PO TBCR: 40.0000 meq | EXTENDED_RELEASE_TABLET | Freq: Once | ORAL | Status: DC

## 2022-11-29 MED ORDER — INSULIN ASPART 100 UNIT/ML IJ SOLN
0.0000 [IU] | Freq: Every day | INTRAMUSCULAR | Status: DC
  Administered 2022-11-29: 2 [IU] via SUBCUTANEOUS
  Administered 2022-11-30: 5 [IU] via SUBCUTANEOUS
  Administered 2022-12-01 – 2022-12-05 (×3): 2 [IU] via SUBCUTANEOUS

## 2022-11-29 MED ORDER — INSULIN GLARGINE-YFGN 100 UNIT/ML ~~LOC~~ SOLN
25.0000 [IU] | Freq: Two times a day (BID) | SUBCUTANEOUS | Status: DC
  Administered 2022-11-29: 25 [IU] via SUBCUTANEOUS
  Filled 2022-11-29 (×2): qty 0.25

## 2022-11-29 MED ORDER — POTASSIUM CHLORIDE 10 MEQ/100ML IV SOLN
10.0000 meq | INTRAVENOUS | Status: AC
  Administered 2022-11-29 (×2): 10 meq via INTRAVENOUS
  Filled 2022-11-29 (×2): qty 100

## 2022-11-29 MED ORDER — INSULIN GLARGINE-YFGN 100 UNIT/ML ~~LOC~~ SOLN
25.0000 [IU] | Freq: Two times a day (BID) | SUBCUTANEOUS | Status: DC
  Filled 2022-11-29: qty 0.25

## 2022-11-29 MED ORDER — INSULIN ASPART 100 UNIT/ML IJ SOLN
10.0000 [IU] | Freq: Three times a day (TID) | INTRAMUSCULAR | Status: DC
  Administered 2022-11-30 – 2022-12-03 (×9): 10 [IU] via SUBCUTANEOUS

## 2022-11-29 MED ORDER — INSULIN ASPART 100 UNIT/ML IJ SOLN
0.0000 [IU] | Freq: Three times a day (TID) | INTRAMUSCULAR | Status: DC
  Administered 2022-11-30: 3 [IU] via SUBCUTANEOUS
  Administered 2022-11-30: 2 [IU] via SUBCUTANEOUS
  Administered 2022-11-30: 5 [IU] via SUBCUTANEOUS
  Administered 2022-12-01: 7 [IU] via SUBCUTANEOUS
  Administered 2022-12-01: 3 [IU] via SUBCUTANEOUS
  Administered 2022-12-01: 7 [IU] via SUBCUTANEOUS
  Administered 2022-12-02: 2 [IU] via SUBCUTANEOUS
  Administered 2022-12-02: 3 [IU] via SUBCUTANEOUS
  Administered 2022-12-02: 1 [IU] via SUBCUTANEOUS
  Administered 2022-12-03 (×2): 2 [IU] via SUBCUTANEOUS
  Administered 2022-12-04: 1 [IU] via SUBCUTANEOUS
  Administered 2022-12-04: 3 [IU] via SUBCUTANEOUS
  Administered 2022-12-05: 1 [IU] via SUBCUTANEOUS
  Administered 2022-12-05: 2 [IU] via SUBCUTANEOUS
  Administered 2022-12-06: 1 [IU] via SUBCUTANEOUS

## 2022-11-29 MED ORDER — INSULIN GLARGINE-YFGN 100 UNIT/ML ~~LOC~~ SOLN
35.0000 [IU] | Freq: Every day | SUBCUTANEOUS | Status: DC
  Filled 2022-11-29: qty 0.35

## 2022-11-29 NOTE — H&P (View-Only) (Signed)
Subjective/Chief Complaint: Pt without complaint    Objective: Vital signs in last 24 hours: Temp:  [98.4 F (36.9 C)-99.7 F (37.6 C)] 99.5 F (37.5 C) (01/22 0717) Pulse Rate:  [62-71] 66 (01/22 0717) Resp:  [16-22] 16 (01/22 0717) BP: (126-184)/(68-92) 184/92 (01/22 0717) SpO2:  [92 %-96 %] 96 % (01/22 0717) Weight:  [122.8 kg] 122.8 kg (01/22 0510) Last BM Date : 11/28/22  Intake/Output from previous day: 01/21 0701 - 01/22 0700 In: 4349.4 [I.V.:1850.2; NG/GT:1904; IV Piggyback:595.2] Out: 1450 [Urine:1450] Intake/Output this shift: No intake/output data recorded.  GI: soft, non-tender; bowel sounds normal; no masses,  no organomegaly  Lab Results:  Recent Labs    11/28/22 0442 11/29/22 0336  WBC 7.1 8.6  HGB 9.3* 10.5*  HCT 30.3* 31.7*  PLT 136* 159   BMET Recent Labs    11/28/22 0716 11/29/22 0336  NA 149* 142  K 4.0 3.4*  CL 110 109  CO2 27 26  GLUCOSE 109* 81  BUN 24* 16  CREATININE 1.11 0.88  CALCIUM 8.4* 8.2*   PT/INR No results for input(s): "LABPROT", "INR" in the last 72 hours. ABG No results for input(s): "PHART", "HCO3" in the last 72 hours.  Invalid input(s): "PCO2", "PO2"  Studies/Results: No results found.  Anti-infectives: Anti-infectives (From admission, onward)    Start     Dose/Rate Route Frequency Ordered Stop   11/24/22 2200  ceFAZolin (ANCEF) IVPB 2g/100 mL premix        2 g 200 mL/hr over 30 Minutes Intravenous Every 8 hours 11/24/22 1443 11/29/22 2359   11/23/22 2200  ceFAZolin (ANCEF) IVPB 2g/100 mL premix  Status:  Discontinued        2 g 200 mL/hr over 30 Minutes Intravenous Every 8 hours 11/23/22 0923 11/24/22 1443   11/21/22 2200  ceFEPIme (MAXIPIME) 2 g in sodium chloride 0.9 % 100 mL IVPB  Status:  Discontinued        2 g 200 mL/hr over 30 Minutes Intravenous Every 24 hours 11/20/22 2257 11/23/22 0923   11/21/22 0800  metroNIDAZOLE (FLAGYL) IVPB 500 mg  Status:  Discontinued        500 mg 100 mL/hr  over 60 Minutes Intravenous Every 12 hours 11/21/22 0007 11/22/22 0821   11/20/22 2359  fluconazole (DIFLUCAN) IVPB 200 mg  Status:  Discontinued        200 mg 100 mL/hr over 60 Minutes Intravenous Daily at bedtime 11/20/22 2328 11/22/22 0821   11/20/22 2256  vancomycin variable dose per unstable renal function (pharmacist dosing)  Status:  Discontinued         Does not apply See admin instructions 11/20/22 2256 11/22/22 0821   11/20/22 2030  ceFEPIme (MAXIPIME) 2 g in sodium chloride 0.9 % 100 mL IVPB        2 g 200 mL/hr over 30 Minutes Intravenous  Once 11/20/22 2022 11/20/22 2329   11/20/22 2030  metroNIDAZOLE (FLAGYL) IVPB 500 mg        500 mg 100 mL/hr over 60 Minutes Intravenous  Once 11/20/22 2022 11/20/22 2329   11/20/22 2030  vancomycin (VANCOCIN) IVPB 1000 mg/200 mL premix  Status:  Discontinued        1,000 mg 200 mL/hr over 60 Minutes Intravenous  Once 11/20/22 2022 11/20/22 2024   11/20/22 2030  vancomycin (VANCOREADY) IVPB 2000 mg/400 mL        2,000 mg 200 mL/hr over 120 Minutes Intravenous  Once 11/20/22 2024 11/21/22 0044  Assessment/Plan: Cholelithiasis, cholecystitis, resolving biliary pancreatitis  Lap chole IOC today  The procedure has been discussed with the patient. Operative and non operative treatments have been discussed. Risks of surgery include bleeding, infection,  Common bile duct injury,  Injury to the stomach,liver, colon,small intestine, abdominal wall,  Diaphragm,  Major blood vessels,  And the need for an open procedure.  Other risks include worsening of medical problems, death,  DVT and pulmonary embolism, and cardiovascular events.   Medical options have also been discussed. The patient has been informed of long term expectations of surgery and non surgical options,  The patient agrees to proceed.     LOS: 8 days    Turner Daniels MD  TOTAL TIME 15 MINUTES  11/29/2022

## 2022-11-29 NOTE — NC FL2 (Signed)
[place] LEVEL OF CARE FORM     IDENTIFICATION  Patient Name: [name] [name]. Birthdate: 1961/07/27 Sex: male Admission Date (Current Location): 11/20/2022  [facility] and [place] Number:  Herbalist and Address:  [facility], [place] [address] [place], [place] [address]      Provider Number: [id number]  Attending Physician Name and Address:  Terrilee Croak, MD  Relative Name and Phone Number:       Current Level of Care: Hospital Recommended Level of Care: [place] Prior Approval Number:    Date Approved/Denied:   PASRR Number: [id number]  Discharge Plan: SNF    Current Diagnoses: Patient Active Problem List   Diagnosis Date Noted   Cholelithiasis with cholecystitis 09/04/2535   Umbilical hernia 64/40/3474   DKA (diabetic ketoacidosis) ([place]) 11/21/2022   High anion gap metabolic acidosis 25/95/6387   Hyperkalemia 11/21/2022   Hypernatremia 11/21/2022   Hypothermia 11/21/2022   New onset a-fib ([place]) 11/21/2022   Encephalopathy acute 11/21/2022   Acute renal failure ([place]) 11/21/2022   Periodontitis 06/17/2022   BMI 29.0-29.9,adult 06/09/2021   Current moderate episode of major depressive disorder ([place]) 06/09/2021   Onychomycosis 08/08/2020   Morbid obesity ([place]) 01/23/2020   Gastroesophageal reflux disease without esophagitis 08/28/2019   Tobacco abuse 01/11/2018   Adenomatous polyp of colon 01/11/2018   Spastic hemiplegia affecting nondominant side ([place]) 01/11/2018   Vascular dementia without behavioral disturbance ([place]) 01/11/2018   H pylori ulcer 10/19/2015   Dyslipidemia associated with type 2 diabetes mellitus ([place]) 10/19/2015   Dementia with behavioral disturbance ([place])    Cognitive impairment    Fall 09/07/2015   Hypokalemia    H/O: stroke with residual effects    Presence of stent in artery 07/28/2015   Prediabetes 07/28/2015   Acute CVA (cerebrovascular accident) ([place]) 06/02/2015    Hemiparesis affecting left side as late effect of stroke ([place]) 06/02/2015   Dyslipidemia 06/02/2015   Type II diabetes mellitus with neurological manifestations ([place]) 06/02/2015   Essential hypertension 06/02/2015   Chronic constipation 06/02/2015   Depression due to stroke 06/02/2015   Dysphagia as late effect of stroke 06/02/2015   Elevated cholesterol with high triglycerides 05/14/2015   QT prolongation 05/08/2015   Hx of ischemic right MCA stroke 05/08/2015    Orientation RESPIRATION BLADDER Height & Weight     Self, Time, Situation, Place  Normal Incontinent, External catheter Weight: 270 lb 11.6 oz (122.8 kg) Height:     BEHAVIORAL SYMPTOMS/MOOD NEUROLOGICAL BOWEL NUTRITION STATUS      Continent Diet (see d/c summary)  AMBULATORY STATUS COMMUNICATION OF NEEDS Skin   Extensive Assist Verbally Normal                       Personal Care Assistance Level of Assistance  Bathing, Feeding, Dressing Bathing Assistance: Maximum assistance Feeding assistance: Independent Dressing Assistance: Maximum assistance     Functional Limitations Info  Sight, Hearing, Speech Sight Info: Adequate Hearing Info: Adequate Speech Info: Impaired    SPECIAL CARE FACTORS FREQUENCY  OT (By licensed OT), PT (By licensed PT)     PT Frequency: 5x/week OT Frequency: 5x/week            Contractures Contractures Info: Not present    Additional Factors Info  Code Status, Allergies Code Status Info: Full code Allergies Info: No known allergies           Current Medications (11/29/2022):  This is  the current hospital active medication list Current Facility-Administered Medications  Medication Dose Route Frequency Provider Last Rate Last Admin   0.9 %  sodium chloride infusion   Intravenous PRN Mannam, Praveen, MD 10 mL/hr at 11/26/22 0452 Infusion Verify at 11/26/22 0452   amLODipine (NORVASC) tablet 10 mg  10 mg Per Tube Daily Mannam, Praveen, MD   10 mg at 11/28/22 1024    ceFAZolin (ANCEF) IVPB 2g/100 mL premix  2 g Intravenous Q8H Mannam, Praveen, MD 200 mL/hr at 11/29/22 1415 2 g at 11/29/22 1415   Chlorhexidine Gluconate Cloth 2 % PADS 6 each  6 each Topical Q0600 Mannam, Praveen, MD   6 each at 11/29/22 0523   dextrose 50 % solution 0-50 mL  0-50 mL Intravenous PRN Andres Labrum D, PA-C   50 mL at 11/29/22 4259   docusate (COLACE) 50 MG/5ML liquid 100 mg  100 mg Per Tube BID PRN Mannam, Praveen, MD   100 mg at 11/25/22 1352   hydrALAZINE (APRESOLINE) injection 10-40 mg  10-40 mg Intravenous Q4H PRN Andres Labrum D, PA-C       insulin aspart (novoLOG) injection 0-5 Units  0-5 Units Subcutaneous QHS Dahal, Binaya, MD       insulin aspart (novoLOG) injection 0-9 Units  0-9 Units Subcutaneous TID WC Dahal, Binaya, MD       insulin aspart (novoLOG) injection 10 Units  10 Units Subcutaneous TID WC Dahal, Binaya, MD       insulin glargine-yfgn (SEMGLEE) injection 25 Units  25 Units Subcutaneous Q12H Dahal, Marlowe Aschoff, MD   25 Units at 11/29/22 1304   metoprolol tartrate (LOPRESSOR) injection 2.5-5 mg  2.5-5 mg Intravenous Q3H PRN Andres Labrum D, PA-C   5 mg at 11/21/22 0735   Oral care mouth rinse  15 mL Mouth Rinse PRN Chesley Mires, MD       pantoprazole (PROTONIX) injection 40 mg  40 mg Intravenous Daily Alvira Philips, RPH   40 mg at 11/29/22 1100   polyethylene glycol (MIRALAX / GLYCOLAX) packet 17 g  17 g Per Tube Daily PRN Mannam, Praveen, MD   17 g at 11/24/22 1546   sodium chloride flush (NS) 0.9 % injection 10-40 mL  10-40 mL Intracatheter Q12H Mannam, Praveen, MD   10 mL at 11/29/22 1045   sodium chloride flush (NS) 0.9 % injection 10-40 mL  10-40 mL Intracatheter PRN Mannam, Praveen, MD       white petrolatum (VASELINE) gel   Topical PRN Mannam, Praveen, MD         Discharge Medications: Please see discharge summary for a list of discharge medications.  Relevant Imaging Results:  Relevant Lab Results:   Additional Information SSN [place]  [place], [place]

## 2022-11-29 NOTE — TOC Progression Note (Signed)
Transition of Care (TOC) - Progression Note    Patient Details  Name: [name] [name]. MRN: [id number] Date of Birth: [birth year]  Transition of Care [facility]) CM/SW [place], [place] Phone Number: 11/29/2022, 4:20 PM  Clinical Narrative:     CSW met with pt to discuss SNF recommendation. Pt mainly communicated with CSW using yes or no answers or nodding. CSW explains SNF rec and workup process. CSW explains medicare coverage and insurance auth process. Pt is agreeable to SNF w/u. He consented to [place] specifically calling his daughter [name].   Fl2 completed and bed requests sent in hub.  CSW attempted to call daughter though phone number listed is not working. RN notified CSW that pt's ex wife was requesting to speak with CSW. CSW will follow up with pt tomorrow and can update pt's ex wife, [name], if that is what he'd like.        Expected Discharge Plan and Services                                               Social Determinants of Health (SDOH) Interventions SDOH Screenings   Food Insecurity: No Food Insecurity (01/11/2018)  Transportation Needs: No Transportation Needs (01/11/2018)  Depression (PHQ2-9): Low Risk  (06/17/2022)  Financial Resource Strain: Low Risk  (01/11/2018)  Physical Activity: Inactive (01/11/2018)  Social Connections: Moderately Isolated (01/11/2018)  Stress: No Stress Concern Present (01/11/2018)  Tobacco Use: Medium Risk (11/27/2022)    Readmission Risk Interventions     No data to display

## 2022-11-29 NOTE — Progress Notes (Signed)
PROGRESS NOTE  [name] [name].  DOB: 06/08/1961  PCP: Sandrea Hughs, NP YYQ:[id number]  DOA: 11/20/2022  LOS: 8 days  Hospital Day: 10  Brief narrative: [name] Rohrman. is a 62 y.o. male with PMH significant for DM2, HTN, HLD, previous right CVA with left-sided deficits, dysphagia, falls who lives at home and uses walker at baseline. 1/13, patient was brought to the ED from home after he was found on the floor.  Last seen normal was 2 days prior.  Per family, patient had been lethargic for about a week.  They believe it was after he started having diarrhea due to laxative that he uses for chronic constipation.  1/9, patient had a fall at home. Per EMS, at the scene, blood pressure was low at 90/56  In the ED, patient was in A-fib with RVR at 140s, initial blood pressure was low at 78/54 Labs with glucose level 1100, anion gap 29, pH low at 7.3, serum bicarb 14, beta-hydroxybutyrate level elevated to more than 8.  WBC count 16, lactic acid 3.2, sodium level elevated to 153, potassium elevated to 6.9, creatinine elevated 5.5 Patient was started on IV fluid, IV insulin drip per DKA protocol Chest x-ray unremarkable Admitted to ICU Blood culture sent on admission showed multi species growth including Proteus Mirabella's, Staph epidermidis and lactobacillus species. Urine culture showed 50,000 CFU per mL of E. coli 1/19, transferred out to [facility]  Subjective: Patient was seen and examined this morning.  Propped up in bed.  Not in distress.  Pending cholecystectomy today.  Noted fluctuations in blood sugar level.  Per RN, patient pulled out core track tube on Saturday.  He has been tolerating dysphagia 1 diet.  Assessment and plan: Severe sepsis - POA Multi species bacteremia and UTI On admission, patient elevated WBC count, elevated lactic acid and low blood pressure.  Met criteria for severe sepsis.   Unclear if the multi species growth in blood and urine are significant  In any  case, patient is clinically improving on IV antibiotics.   Currently on IV Ancef.  Continue same  Recent Labs  Lab 11/23/22 0455 11/24/22 0327 11/25/22 0415 11/26/22 0515 11/27/22 0250 11/28/22 0442 11/29/22 0336  WBC 10.3   < > 9.0 7.6 7.1 7.1 8.6  LATICACIDVEN 1.9  --   --   --   --   --   --    < > = values in this interval not displayed.   DKA Initial labs as above showed DKA Improved with IV insulin drip, IV fluid and electrolyte management. Subsequently switched to subcu insulin.  Type 2 diabetes mellitus uncontrolled  hypOglycemia A1c uncontrolled at 13.5 on 11/21/2022 PTA not on meds. While getting tube feeding, patient required to be Semglee 50 units twice daily, NovoLog 12 units every 4 hours with sliding scale insulin.  Patient is no longer requiring tube feeding and hence would not require every 4 hours short-acting insulin.  Blood sugar level was low at 61 this morning.  Will reduce Semglee to 25 units twice daily and sliding scale Premeal to 10 units 3 times daily along with sliding scale insulin. Recent Labs  Lab 11/28/22 1935 11/29/22 0023 11/29/22 0502 11/29/22 0635 11/29/22 0806  GLUCAP 318* 155* 61* 141* 154*   Acute cholecystitis and cholelithiasis Acute biliary pancreatitis Initial lipase level elevated to more than 1000.   1/14, CT abdomen showed acute interstitial pancreatitis with minimal nonlocalizing peripancreatic fluid, without abscess or free air.  Also showed cholelithiasis without acute cholecystitis Adequately hydrated. Lipase level gradually trending down. 1/19, right upper quadrant ultrasound showed gallstones and acute cholecystitis General surgery consult obtained.  Tentative plan for lap chole today. Recent Labs  Lab 11/23/22 0455 11/24/22 0327 11/25/22 0415 11/25/22 0526 11/26/22 0515 11/27/22 0250 11/28/22 0442 11/28/22 0716 11/29/22 0336  AST 51*  --   --  59*  --  150*  --  93* 49*  ALT 22  --   --  24  --  73*  --  46* 32   ALKPHOS 92  --   --  125  --  315*  --  314* 248*  BILITOT 0.5  --   --  0.4  --  0.2*  --  0.4 0.3  PROT 6.1*  --   --  5.9*  --  5.9*  --  6.5 6.1*  ALBUMIN 2.1*  --   --  1.8*  --  1.7*  --  1.9* 1.7*  LIPASE  --   --   --   --   --  119*  --   --  79*  PLT 80*   < > 78*  --  95* 126* 136*  --  159   < > = values in this interval not displayed.   AKI Initial creatinine was elevated to 5.5 due to sepsis, DKA, pancreatitis Creatinine gradually improved, normal at 1.24 today.  Continue to monitor Recent Labs    11/22/22 1230 11/22/22 1633 11/23/22 0455 11/23/22 1510 11/24/22 0327 11/25/22 0526 11/26/22 0515 11/27/22 0250 11/28/22 0716 11/29/22 0336  BUN 83* 80* 63* 54* 36* 33* 31* 32* 24* 16  CREATININE 3.36* 2.75* 2.03* 1.85* 1.28* 1.45* 1.24 1.36* 1.11 0.88   Hypernatremia Sodium level was initially elevated and peaked at 163.  Gradually improved with hydration.   Recent Labs  Lab 11/22/22 1230 11/22/22 1633 11/23/22 0455 11/23/22 1510 11/24/22 0327 11/25/22 0526 11/26/22 0515 11/27/22 0250 11/28/22 0716 11/29/22 0336  NA 162* 159* 155* 152* 141 144 149* 150* 149* 142   A-fib with RVR New onset A-fib in the setting of sepsis, hypovolemia PTA not on any AV node blocking agent. Currently in normal sinus rhythm.  CHADSVASC score elevated to 5 (HTN, HLD, DM2, stroke).   1/19, started on full dose Lovenox.  Plan to switch to oral anticoagulant at discharge.  Essential hypertension PTA on amlodipine 10 mg daily, hydralazine 25 mg 3 times daily, clonidine as needed,  Currently on amlodipine 10 mg daily.  Acute encephalopathy Impaired mentation likely due to combination of sepsis, DKA, pancreatitis and in the background of history of stroke Initial CT head did not show any acute intracranial abnormality Continue to monitor mental status change  Recent right CVA with left-sided deficits and dysphagia HLD CT scan of head 1/13 showed a large remote right MCA  distribution infarct with additional remote lacunar infarct at the left basal ganglia. Patient states, he was taking aspirin 81 mg daily and Lipitor 40 mg daily.  Since has been started on anticoagulation, no need to resume aspirin.  Lipitor on hold because of elevated liver enzymes Continue meclizine PRN for dizziness  Dysphagia While in ICU, patient was started on core track feeding.  Patient pulled it out on 1/20. Last seen by speech on 1/18.  On dysphagia diet  Prolonged qtc Initial EKG with QTc prolonged to 509 ms probably because of RVR an abnormal electrolytes  Repeat EKG 1/19 showed QTc 426 ms. Continue  to monitor  GERD Continue PPI  Mild acute anemia Hb >10. No active bleeding. Continue to monitor. Recent Labs    11/25/22 0415 11/26/22 0515 11/27/22 0250 11/28/22 0442 11/29/22 0336  HGB 11.7* 11.4* 10.5* 9.3* 10.5*  MCV 90.7 90.2 92.9 94.1 90.1   Thrombocytopenia Resolved Recent Labs  Lab 11/23/22 0455 11/24/22 0327 11/25/22 0415 11/26/22 0515 11/27/22 0250 11/28/22 0442 11/29/22 0336  PLT 80* 67* 78* 95* 126* 136* 159   Major depressive disorder PTA on fluoxetine.    Impaired mobility Has baseline left-sided deficits after the stroke.  Current acute weakness related to acute comorbidities and prolonged hospitalization. PT eval recommended SNF TOC following   Goals of care   Code Status: Full Code     DVT prophylaxis: Full dose Lovenox.  Will hold tonight's dose   Antimicrobials: IV Ancef Fluid: None Consultants: None Family Communication: None at bedside  Scheduled Meds:  amLODipine  10 mg Per Tube Daily   Chlorhexidine Gluconate Cloth  6 each Topical Daily   Chlorhexidine Gluconate Cloth  6 each Topical Q0600   feeding supplement (PROSource TF20)  60 mL Per Tube Daily   free water  100 mL Per Tube Q4H   insulin aspart  0-5 Units Subcutaneous QHS   insulin aspart  0-9 Units Subcutaneous TID WC   insulin aspart  10 Units Subcutaneous TID  WC   insulin glargine-yfgn  25 Units Subcutaneous Q12H   pantoprazole (PROTONIX) IV  40 mg Intravenous Daily   sodium chloride flush  10-40 mL Intracatheter Q12H    PRN meds: sodium chloride, dextrose, docusate, hydrALAZINE, metoprolol tartrate, mouth rinse, polyethylene glycol, sodium chloride flush, white petrolatum   Infusions:   sodium chloride 10 mL/hr at 11/26/22 0452    ceFAZolin (ANCEF) IV 2 g (11/29/22 0631)   feeding supplement (GLUCERNA 1.5 CAL) Stopped (11/28/22 0035)   potassium chloride 10 mEq (11/29/22 1045)    Antimicrobials: Anti-infectives (From admission, onward)    Start     Dose/Rate Route Frequency Ordered Stop   11/24/22 2200  ceFAZolin (ANCEF) IVPB 2g/100 mL premix        2 g 200 mL/hr over 30 Minutes Intravenous Every 8 hours 11/24/22 1443 11/29/22 2359   11/23/22 2200  ceFAZolin (ANCEF) IVPB 2g/100 mL premix  Status:  Discontinued        2 g 200 mL/hr over 30 Minutes Intravenous Every 8 hours 11/23/22 0923 11/24/22 1443   11/21/22 2200  ceFEPIme (MAXIPIME) 2 g in sodium chloride 0.9 % 100 mL IVPB  Status:  Discontinued        2 g 200 mL/hr over 30 Minutes Intravenous Every 24 hours 11/20/22 2257 11/23/22 0923   11/21/22 0800  metroNIDAZOLE (FLAGYL) IVPB 500 mg  Status:  Discontinued        500 mg 100 mL/hr over 60 Minutes Intravenous Every 12 hours 11/21/22 0007 11/22/22 0821   11/20/22 2359  fluconazole (DIFLUCAN) IVPB 200 mg  Status:  Discontinued        200 mg 100 mL/hr over 60 Minutes Intravenous Daily at bedtime 11/20/22 2328 11/22/22 0821   11/20/22 2256  vancomycin variable dose per unstable renal function (pharmacist dosing)  Status:  Discontinued         Does not apply See admin instructions 11/20/22 2256 11/22/22 0821   11/20/22 2030  ceFEPIme (MAXIPIME) 2 g in sodium chloride 0.9 % 100 mL IVPB        2 g 200 mL/hr over 30 Minutes  Intravenous  Once 11/20/22 2022 11/20/22 2329   11/20/22 2030  metroNIDAZOLE (FLAGYL) IVPB 500 mg        500  mg 100 mL/hr over 60 Minutes Intravenous  Once 11/20/22 2022 11/20/22 2329   11/20/22 2030  vancomycin (VANCOCIN) IVPB 1000 mg/200 mL premix  Status:  Discontinued        1,000 mg 200 mL/hr over 60 Minutes Intravenous  Once 11/20/22 2022 11/20/22 2024   11/20/22 2030  vancomycin (VANCOREADY) IVPB 2000 mg/400 mL        2,000 mg 200 mL/hr over 120 Minutes Intravenous  Once 11/20/22 2024 11/21/22 0044       Skin assessment:      Diet:  Diet Order             Diet NPO time specified Except for: Sips with Meds  Diet effective midnight                   Nutritional status:  Body mass index is 34.76 kg/m.  Nutrition Problem: Inadequate oral intake Etiology: inability to eat Signs/Symptoms: NPO status      Status is: Inpatient Level of care: Med-Surg  Continue in-hospital care because: Pending cholecystectomy today  Dispo: The patient is from: Home              Anticipated d/c is to: SNF              Patient currently is not medically stable to d/c.   Difficult to place patient No   Objective: Vitals:   11/29/22 0510 11/29/22 0717  BP: (!) 153/68 (!) 184/92  Pulse: 62 66  Resp: 20 16  Temp: 98.4 F (36.9 C) 99.5 F (37.5 C)  SpO2: 92% 96%    Intake/Output Summary (Last 24 hours) at 11/29/2022 1123 Last data filed at 11/29/2022 0601 Gross per 24 hour  Intake 4349.38 ml  Output 1450 ml  Net 2899.38 ml   Filed Weights   11/27/22 0500 11/28/22 0500 11/29/22 0510  Weight: 119.2 kg 120.1 kg 122.8 kg   Weight change: 2.7 kg Body mass index is 34.76 kg/m.   Physical Exam: General exam: Pleasant, elderly Caucasian male.  Propped up in bed.  Not in distress Skin: No rashes, lesions or ulcers. HEENT: Atraumatic, normocephalic, no obvious bleeding.  Lungs: Clear to auscultation bilaterally CVS: Regular rate and rhythm, no murmur GI/Abd soft, distended from obesity, right upper quadrant tenderness present, bowel sound present CNS: Alert, awake, oriented to  place and person.  Left-sided deficits at baseline Psychiatry: Sad affect Extremities: No pedal edema.  No tenderness  Data Review: I have personally reviewed the laboratory data and studies available.  F/u labs ordered Unresulted Labs (From admission, onward)    None       Total time spent in review of labs and imaging, patient evaluation, formulation of plan, documentation and communication with family: 79 minutes  Signed, Terrilee Croak, MD [facility] 11/29/2022 Mild acute anemia mild acute anemia thrombocytopenia

## 2022-11-29 NOTE — Progress Notes (Signed)
Physical Therapy Treatment Patient Details Name: [name] [name]. MRN: [id number] DOB: [birth year] Today's Date: 11/29/2022   History of Present Illness 62 yo male admitted 1/13 after found on the floor at home. Pt with DKA, hypothermic, hypotensive, AFib with RVR. PMhx: CVA with lt hemiplegia, DM, vascular dementia, HTN, HLD    PT Comments    Pt received in supine, agreeable to therapy session and with good participation and tolerance for bed mobility, seated exercises and balance challenge and transfer training. Pt needing up to maxA to perform transfers and needed multimodal cues most tasks, with increased time needed to achieve seated balance and frequent LOB to L side/L lateral lean. Pt c/o dizziness upon standing and unable to progress gait this session, of note pt still showing as NPO, MD/RN notified he may need diet order put in if no procedure today. Pt continues to benefit from PT services to progress toward functional mobility goals.    Recommendations for follow up therapy are one component of a multi-disciplinary discharge planning process, led by the attending physician.  Recommendations may be updated based on patient status, additional functional criteria and insurance authorization.  Follow Up Recommendations  Skilled nursing-short term rehab (<3 hours/day) Can patient physically be transported by private vehicle: No   Assistance Recommended at Discharge Frequent or constant Supervision/Assistance  Patient can return home with the following Two people to help with walking and/or transfers;A lot of help with bathing/dressing/bathroom;Assistance with cooking/housework;Direct supervision/assist for medications management;Direct supervision/assist for financial management;Assist for transportation;Help with stairs or ramp for entrance   Equipment Recommendations  None recommended by PT (TBD post-acute)    Recommendations for Other Services       Precautions / Restrictions  Precautions Precautions: Fall;Other (comment) Precaution Comments: L UE hemiparesis Restrictions Weight Bearing Restrictions: No     Mobility  Bed Mobility Overal bed mobility: Needs Assistance Bed Mobility: Rolling, Sidelying to Sit, Sit to Sidelying Rolling: Mod assist Sidelying to sit: Max assist, HOB elevated     Sit to sidelying: Mod assist, HOB elevated General bed mobility comments: mod cues, increased time/effort to advance legs, heavy maxA for sidelying to push up toward L EOB. Pt unable to use LUE to assist with trunk raise. ModA to lift BLE over EOB returning to supine.    Transfers Overall transfer level: Needs assistance Equipment used: Rolling walker (2 wheels) Transfers: Sit to/from Stand Sit to Stand: Mod assist, From elevated surface          Lateral/Scoot Transfers: Max assist General transfer comment: ModA for STS from elevated bed and defer gait due to c/o dizziness. Of note, pt still NPO, likely mildly dehydrated. MaxA for seated scooting toward his L side with dense cues and manual assist for improved body mechanics, ~18" to L side. Too dizzy to attempt step pivot.    Ambulation/Gait               General Gait Details: defer, fatigued/dizzy   Stairs             Wheelchair Mobility    Modified Rankin (Stroke Patients Only) Modified Rankin (Stroke Patients Only) Pre-Morbid Rankin Score:  (baseline CVA LUE hemi)     Balance Overall balance assessment: Needs assistance Sitting-balance support: Feet supported, Single extremity supported Sitting balance-Leahy Scale: Poor Sitting balance - Comments: modA with L lateral lean, progressed to minA w/increased time Postural control: Left lateral lean Standing balance support: Single extremity supported, Reliant on assistive device for balance Standing balance-Leahy Scale:  Poor Standing balance comment: RUE reliant on RW and LUE external assist from PTA; minA to modA for steadying, pt c/o  dizziness so UTA gait                            Cognition Arousal/Alertness: Awake/alert Behavior During Therapy: WFL for tasks assessed/performed Overall Cognitive Status: Difficult to assess Area of Impairment: Problem solving, Awareness, Following commands, Attention, Safety/judgement                 Orientation Level: Situation Current Attention Level: Selective Memory: Decreased short-term memory, Decreased recall of precautions Following Commands: Follows one step commands with increased time, Follows one step commands consistently, Follows multi-step commands inconsistently Safety/Judgement: Decreased awareness of safety Awareness: Intellectual Problem Solving: Slow processing, Requires verbal cues, Requires tactile cues, Difficulty sequencing General Comments: Pt needs multimodal cues for sequencing bed mobility and transfer, pt c/o lightheadedness upon standing (no diet order in yet and appears procedure was cancelled, MD notified pt wants diet order put in). Pt ex-spouse leaving room at beginning of session states she cannot provide lift assist for him.        Exercises General Exercises - Lower Extremity Ankle Circles/Pumps: AROM, Both, 10 reps, Supine Long Arc Quad: AROM, Both, 10 reps, Seated Hip Flexion/Marching: AAROM, 10 reps, AROM, Both, Seated Other Exercises Other Exercises: seated LUE AROM: wrist flex/ext, elbow flex/ext, shoulder flex/ext x5-10 reps ea Other Exercises: seated BUE reaching activity x5 reps ea direction, single arm then both arms, challenging him to lean forward anterior/laterally Other Exercises: seated balance with focus on midline posture    General Comments General comments (skin integrity, edema, etc.): Pt still NPO, MD/RN notified as it appears his procedure date was changed to following day, pt requesting to eat. Pt c/o dizziness but hasn't eaten today, plan to assess orthostatics next date if pt still dizzy with  transfers.      Pertinent Vitals/Pain Pain Assessment Pain Assessment: No/denies pain     PT Goals (current goals can now be found in the care plan section) Acute Rehab PT Goals Patient Stated Goal: to get better PT Goal Formulation: With patient Time For Goal Achievement: 12/08/22 Potential to Achieve Goals: Good Progress towards PT goals: Progressing toward goals    Frequency    Min 3X/week      PT Plan Current plan remains appropriate       AM-PAC PT "6 Clicks" Mobility   Outcome Measure  Help needed turning from your back to your side while in a flat bed without using bedrails?: A Lot Help needed moving from lying on your back to sitting on the side of a flat bed without using bedrails?: A Lot Help needed moving to and from a bed to a chair (including a wheelchair)?: A Lot Help needed standing up from a chair using your arms (e.g., wheelchair or bedside chair)?: A Lot Help needed to walk in hospital room?: Total Help needed climbing 3-5 steps with a railing? : Total 6 Click Score: 10    End of Session Equipment Utilized During Treatment: Gait belt Activity Tolerance: Patient limited by fatigue;Other (comment);Treatment limited secondary to medical complications (Comment) (NPO/lightheaded) Patient left: in bed;with call bell/phone within reach;with bed alarm set;Other (comment) (bed in chair posture, heels floated) Nurse Communication: Mobility status;Other (comment) (pt requesting a diet order be put in) PT Visit Diagnosis: Unsteadiness on feet (R26.81);Muscle weakness (generalized) (M62.81)     Time: 5329-9242 PT Time  Calculation (min) (ACUTE ONLY): 25 min  Charges:  $Therapeutic Exercise: 8-22 mins $Therapeutic Activity: 8-22 mins                     Aum Caggiano P., PTA Acute Rehabilitation Services Secure Chat Preferred 9a-5:30pm Office: [place] 11/29/2022, 6:17 PM

## 2022-11-29 NOTE — Progress Notes (Signed)
SLP Cancellation Note  Patient Details Name: [name] [name]. MRN: [id number] DOB: 04-Jan-1961   Cancelled treatment: NPO for potential procedure today. Will follow for readiness to resume POs.  [name] [name] L. Tivis Ringer, MA CCC/SLP Clinical Specialist - Acute Care SLP Acute Rehabilitation Services Office number [phone]           [name] [name] 11/29/2022, 9:23 AM

## 2022-11-29 NOTE — Progress Notes (Signed)
Subjective/Chief Complaint: Pt without complaint    Objective: Vital signs in last 24 hours: Temp:  [98.4 F (36.9 C)-99.7 F (37.6 C)] 99.5 F (37.5 C) (01/22 0717) Pulse Rate:  [62-71] 66 (01/22 0717) Resp:  [16-22] 16 (01/22 0717) BP: (126-184)/(68-92) 184/92 (01/22 0717) SpO2:  [92 %-96 %] 96 % (01/22 0717) Weight:  [122.8 kg] 122.8 kg (01/22 0510) Last BM Date : 11/28/22  Intake/Output from previous day: 01/21 0701 - 01/22 0700 In: 4349.4 [I.V.:1850.2; NG/GT:1904; IV Piggyback:595.2] Out: 1450 [Urine:1450] Intake/Output this shift: No intake/output data recorded.  GI: soft, non-tender; bowel sounds normal; no masses,  no organomegaly  Lab Results:  Recent Labs    11/28/22 0442 11/29/22 0336  WBC 7.1 8.6  HGB 9.3* 10.5*  HCT 30.3* 31.7*  PLT 136* 159   BMET Recent Labs    11/28/22 0716 11/29/22 0336  NA 149* 142  K 4.0 3.4*  CL 110 109  CO2 27 26  GLUCOSE 109* 81  BUN 24* 16  CREATININE 1.11 0.88  CALCIUM 8.4* 8.2*   PT/INR No results for input(s): "LABPROT", "INR" in the last 72 hours. ABG No results for input(s): "PHART", "HCO3" in the last 72 hours.  Invalid input(s): "PCO2", "PO2"  Studies/Results: No results found.  Anti-infectives: Anti-infectives (From admission, onward)    Start     Dose/Rate Route Frequency Ordered Stop   11/24/22 2200  ceFAZolin (ANCEF) IVPB 2g/100 mL premix        2 g 200 mL/hr over 30 Minutes Intravenous Every 8 hours 11/24/22 1443 11/29/22 2359   11/23/22 2200  ceFAZolin (ANCEF) IVPB 2g/100 mL premix  Status:  Discontinued        2 g 200 mL/hr over 30 Minutes Intravenous Every 8 hours 11/23/22 0923 11/24/22 1443   11/21/22 2200  ceFEPIme (MAXIPIME) 2 g in sodium chloride 0.9 % 100 mL IVPB  Status:  Discontinued        2 g 200 mL/hr over 30 Minutes Intravenous Every 24 hours 11/20/22 2257 11/23/22 0923   11/21/22 0800  metroNIDAZOLE (FLAGYL) IVPB 500 mg  Status:  Discontinued        500 mg 100 mL/hr  over 60 Minutes Intravenous Every 12 hours 11/21/22 0007 11/22/22 0821   11/20/22 2359  fluconazole (DIFLUCAN) IVPB 200 mg  Status:  Discontinued        200 mg 100 mL/hr over 60 Minutes Intravenous Daily at bedtime 11/20/22 2328 11/22/22 0821   11/20/22 2256  vancomycin variable dose per unstable renal function (pharmacist dosing)  Status:  Discontinued         Does not apply See admin instructions 11/20/22 2256 11/22/22 0821   11/20/22 2030  ceFEPIme (MAXIPIME) 2 g in sodium chloride 0.9 % 100 mL IVPB        2 g 200 mL/hr over 30 Minutes Intravenous  Once 11/20/22 2022 11/20/22 2329   11/20/22 2030  metroNIDAZOLE (FLAGYL) IVPB 500 mg        500 mg 100 mL/hr over 60 Minutes Intravenous  Once 11/20/22 2022 11/20/22 2329   11/20/22 2030  vancomycin (VANCOCIN) IVPB 1000 mg/200 mL premix  Status:  Discontinued        1,000 mg 200 mL/hr over 60 Minutes Intravenous  Once 11/20/22 2022 11/20/22 2024   11/20/22 2030  vancomycin (VANCOREADY) IVPB 2000 mg/400 mL        2,000 mg 200 mL/hr over 120 Minutes Intravenous  Once 11/20/22 2024 11/21/22 0044  Assessment/Plan: Cholelithiasis, cholecystitis, resolving biliary pancreatitis  Lap chole IOC today  The procedure has been discussed with the patient. Operative and non operative treatments have been discussed. Risks of surgery include bleeding, infection,  Common bile duct injury,  Injury to the stomach,liver, colon,small intestine, abdominal wall,  Diaphragm,  Major blood vessels,  And the need for an open procedure.  Other risks include worsening of medical problems, death,  DVT and pulmonary embolism, and cardiovascular events.   Medical options have also been discussed. The patient has been informed of long term expectations of surgery and non surgical options,  The patient agrees to proceed.     LOS: 8 days    Turner Daniels MD  TOTAL TIME 15 MINUTES  11/29/2022

## 2022-11-29 NOTE — Care Management Important Message (Signed)
Important Message  Patient Details  Name: [name] [name]. MRN: [id number] Date of Birth: Oct 04, 1961   Medicare Important Message Given:  Yes     Hannah Beat 11/29/2022, 3:49 PM

## 2022-11-30 ENCOUNTER — Encounter (HOSPITAL_COMMUNITY): Payer: Self-pay | Admitting: Pulmonary Disease

## 2022-11-30 ENCOUNTER — Encounter (HOSPITAL_COMMUNITY)
Admission: EM | Disposition: A | Discharge status: Skilled Nursing Facility | Payer: Self-pay | Source: Home / Self Care | Attending: Internal Medicine

## 2022-11-30 ENCOUNTER — Inpatient Hospital Stay (HOSPITAL_COMMUNITY): Payer: Medicare (Managed Care) | Admitting: Anesthesiology

## 2022-11-30 ENCOUNTER — Inpatient Hospital Stay (HOSPITAL_COMMUNITY): Payer: Medicare (Managed Care)

## 2022-11-30 ENCOUNTER — Other Ambulatory Visit: Payer: Self-pay

## 2022-11-30 DIAGNOSIS — Z87891 Personal history of nicotine dependence: Secondary | ICD-10-CM

## 2022-11-30 DIAGNOSIS — I4891 Unspecified atrial fibrillation: Secondary | ICD-10-CM | POA: Diagnosis not present

## 2022-11-30 DIAGNOSIS — E1165 Type 2 diabetes mellitus with hyperglycemia: Secondary | ICD-10-CM | POA: Diagnosis not present

## 2022-11-30 DIAGNOSIS — K851 Biliary acute pancreatitis without necrosis or infection: Secondary | ICD-10-CM

## 2022-11-30 DIAGNOSIS — E1110 Type 2 diabetes mellitus with ketoacidosis without coma: Secondary | ICD-10-CM | POA: Diagnosis not present

## 2022-11-30 DIAGNOSIS — I10 Essential (primary) hypertension: Secondary | ICD-10-CM

## 2022-11-30 HISTORY — PX: CHOLECYSTECTOMY: SHX55

## 2022-11-30 LAB — GLUCOSE, CAPILLARY
Glucose-Capillary: 322 mg/dL — ABNORMAL HIGH (ref 70–99)
Glucose-Capillary: 247 mg/dL — ABNORMAL HIGH (ref 70–99)
Glucose-Capillary: 204 mg/dL — ABNORMAL HIGH (ref 70–99)
Glucose-Capillary: 192 mg/dL — ABNORMAL HIGH (ref 70–99)
Glucose-Capillary: 259 mg/dL — ABNORMAL HIGH (ref 70–99)
Glucose-Capillary: 389 mg/dL — ABNORMAL HIGH (ref 70–99)

## 2022-11-30 SURGERY — LAPAROSCOPIC CHOLECYSTECTOMY WITH INTRAOPERATIVE CHOLANGIOGRAM
Anesthesia: General | Site: Abdomen

## 2022-11-30 MED ORDER — DEXAMETHASONE SODIUM PHOSPHATE 10 MG/ML IJ SOLN
INTRAMUSCULAR | Status: AC
  Filled 2022-11-30: qty 1

## 2022-11-30 MED ORDER — 0.9 % SODIUM CHLORIDE (POUR BTL) OPTIME
TOPICAL | Status: DC | PRN
  Administered 2022-11-30: 350 mL

## 2022-11-30 MED ORDER — CEFAZOLIN SODIUM-DEXTROSE 2-4 GM/100ML-% IV SOLN
INTRAVENOUS | Status: AC
  Filled 2022-11-30: qty 100

## 2022-11-30 MED ORDER — BUPIVACAINE-EPINEPHRINE 0.5% -1:200000 IJ SOLN
INTRAMUSCULAR | Status: DC | PRN
  Administered 2022-11-30: 13 mL

## 2022-11-30 MED ORDER — INSULIN ASPART 100 UNIT/ML IJ SOLN
5.0000 [IU] | Freq: Once | INTRAMUSCULAR | Status: AC
  Administered 2022-11-30: 5 [IU] via SUBCUTANEOUS

## 2022-11-30 MED ORDER — SUGAMMADEX SODIUM 200 MG/2ML IV SOLN
INTRAVENOUS | Status: DC | PRN
  Administered 2022-11-30: 200 mg via INTRAVENOUS

## 2022-11-30 MED ORDER — ONDANSETRON HCL 4 MG/2ML IJ SOLN
INTRAMUSCULAR | Status: DC | PRN
  Administered 2022-11-30: 4 mg via INTRAVENOUS

## 2022-11-30 MED ORDER — LACTATED RINGERS IV SOLN: INTRAVENOUS | Status: DC

## 2022-11-30 MED ORDER — LIDOCAINE 2% (20 MG/ML) 5 ML SYRINGE
INTRAMUSCULAR | Status: AC
  Filled 2022-11-30: qty 5

## 2022-11-30 MED ORDER — ONDANSETRON HCL 4 MG/2ML IJ SOLN: 4.0000 mg | Freq: Once | INTRAMUSCULAR | Status: DC | PRN

## 2022-11-30 MED ORDER — PHENYLEPHRINE 80 MCG/ML (10ML) SYRINGE FOR IV PUSH (FOR BLOOD PRESSURE SUPPORT)
PREFILLED_SYRINGE | INTRAVENOUS | Status: DC | PRN
  Administered 2022-11-30: 120 ug via INTRAVENOUS
  Administered 2022-11-30: 80 ug via INTRAVENOUS

## 2022-11-30 MED ORDER — PROPOFOL 10 MG/ML IV BOLUS
INTRAVENOUS | Status: AC
  Filled 2022-11-30: qty 20

## 2022-11-30 MED ORDER — ORAL CARE MOUTH RINSE: 15.0000 mL | Freq: Once | OROMUCOSAL | Status: AC

## 2022-11-30 MED ORDER — BUPIVACAINE-EPINEPHRINE (PF) 0.5% -1:200000 IJ SOLN
INTRAMUSCULAR | Status: AC
  Filled 2022-11-30: qty 30

## 2022-11-30 MED ORDER — CHLORHEXIDINE GLUCONATE 0.12 % MT SOLN: 15.0000 mL | Freq: Once | OROMUCOSAL | Status: AC

## 2022-11-30 MED ORDER — SODIUM CHLORIDE 0.9 % IV SOLN
INTRAVENOUS | Status: DC | PRN
  Administered 2022-11-30: 30 mL

## 2022-11-30 MED ORDER — FENTANYL CITRATE (PF) 250 MCG/5ML IJ SOLN
INTRAMUSCULAR | Status: DC | PRN
  Administered 2022-11-30 (×3): 50 ug via INTRAVENOUS

## 2022-11-30 MED ORDER — INSULIN GLARGINE-YFGN 100 UNIT/ML ~~LOC~~ SOLN
25.0000 [IU] | Freq: Two times a day (BID) | SUBCUTANEOUS | Status: DC
  Administered 2022-11-30 (×2): 25 [IU] via SUBCUTANEOUS
  Filled 2022-11-30 (×5): qty 0.25

## 2022-11-30 MED ORDER — ACETAMINOPHEN 500 MG PO TABS
1000.0000 mg | ORAL_TABLET | Freq: Once | ORAL | Status: AC
  Administered 2022-11-30: 1000 mg via ORAL
  Filled 2022-11-30: qty 2

## 2022-11-30 MED ORDER — ROCURONIUM BROMIDE 10 MG/ML (PF) SYRINGE
PREFILLED_SYRINGE | INTRAVENOUS | Status: DC | PRN
  Administered 2022-11-30 (×2): 10 mg via INTRAVENOUS
  Administered 2022-11-30: 40 mg via INTRAVENOUS

## 2022-11-30 MED ORDER — CHLORHEXIDINE GLUCONATE 0.12 % MT SOLN
OROMUCOSAL | Status: AC
  Administered 2022-11-30: 15 mL via OROMUCOSAL
  Filled 2022-11-30: qty 15

## 2022-11-30 MED ORDER — AMISULPRIDE (ANTIEMETIC) 5 MG/2ML IV SOLN: 10.0000 mg | Freq: Once | INTRAVENOUS | Status: DC | PRN

## 2022-11-30 MED ORDER — FENTANYL CITRATE (PF) 250 MCG/5ML IJ SOLN
INTRAMUSCULAR | Status: AC
  Filled 2022-11-30: qty 5

## 2022-11-30 MED ORDER — OXYCODONE-ACETAMINOPHEN 5-325 MG PO TABS
1.0000 | ORAL_TABLET | ORAL | Status: DC | PRN
  Administered 2022-12-01 – 2022-12-03 (×2): 1 via ORAL
  Filled 2022-11-30 (×2): qty 1

## 2022-11-30 MED ORDER — SODIUM CHLORIDE 0.9 % IR SOLN
Status: DC | PRN
  Administered 2022-11-30: 2000 mL

## 2022-11-30 MED ORDER — CHLORHEXIDINE GLUCONATE CLOTH 2 % EX PADS
6.0000 | MEDICATED_PAD | Freq: Every day | CUTANEOUS | Status: AC
  Administered 2022-11-30 – 2022-12-03 (×4): 6 via TOPICAL

## 2022-11-30 MED ORDER — PROPOFOL 10 MG/ML IV BOLUS
INTRAVENOUS | Status: DC | PRN
  Administered 2022-11-30: 200 mg via INTRAVENOUS

## 2022-11-30 MED ORDER — OXYCODONE HCL 5 MG PO TABS: 5.0000 mg | ORAL_TABLET | Freq: Once | ORAL | Status: DC | PRN

## 2022-11-30 MED ORDER — HYDROMORPHONE HCL 1 MG/ML IJ SOLN: 0.2500 mg | INTRAMUSCULAR | Status: DC | PRN

## 2022-11-30 MED ORDER — SUCCINYLCHOLINE CHLORIDE 200 MG/10ML IV SOSY
PREFILLED_SYRINGE | INTRAVENOUS | Status: DC | PRN
  Administered 2022-11-30: 180 mg via INTRAVENOUS

## 2022-11-30 MED ORDER — OXYCODONE HCL 5 MG/5ML PO SOLN: 5.0000 mg | Freq: Once | ORAL | Status: DC | PRN

## 2022-11-30 MED ORDER — DEXAMETHASONE SODIUM PHOSPHATE 10 MG/ML IJ SOLN
INTRAMUSCULAR | Status: DC | PRN
  Administered 2022-11-30: 4 mg via INTRAVENOUS

## 2022-11-30 MED ORDER — ONDANSETRON HCL 4 MG/2ML IJ SOLN
INTRAMUSCULAR | Status: AC
  Filled 2022-11-30: qty 2

## 2022-11-30 MED ORDER — MUPIROCIN 2 % EX OINT
1.0000 | TOPICAL_OINTMENT | Freq: Two times a day (BID) | CUTANEOUS | Status: AC
  Administered 2022-11-30 – 2022-12-04 (×9): 1 via NASAL
  Filled 2022-11-30 (×2): qty 22

## 2022-11-30 MED ORDER — SUCCINYLCHOLINE CHLORIDE 200 MG/10ML IV SOSY
PREFILLED_SYRINGE | INTRAVENOUS | Status: AC
  Filled 2022-11-30: qty 10

## 2022-11-30 MED ORDER — CEFAZOLIN SODIUM-DEXTROSE 2-3 GM-%(50ML) IV SOLR
INTRAVENOUS | Status: DC | PRN
  Administered 2022-11-30: 2 g via INTRAVENOUS

## 2022-11-30 SURGICAL SUPPLY — 46 items
ADH SKN CLS APL DERMABOND .7 (GAUZE/BANDAGES/DRESSINGS) ×1
APL PRP STRL LF DISP 70% ISPRP (MISCELLANEOUS) ×1
APPLIER CLIP ROT 10 11.4 M/L (STAPLE) ×1
APR CLP MED LRG 11.4X10 (STAPLE) ×1
BAG COUNTER SPONGE SURGICOUNT (BAG) ×1 IMPLANT
BAG SPEC RTRVL 10 TROC 200 (ENDOMECHANICALS) ×1
BAG SPNG CNTER NS LX DISP (BAG) ×1
BLADE CLIPPER SURG (BLADE) IMPLANT
CANISTER SUCT 3000ML PPV (MISCELLANEOUS) ×1 IMPLANT
CHLORAPREP W/TINT 26 (MISCELLANEOUS) ×1 IMPLANT
CLIP APPLIE ROT 10 11.4 M/L (STAPLE) ×1 IMPLANT
COVER MAYO STAND STRL (DRAPES) ×1 IMPLANT
COVER SURGICAL LIGHT HANDLE (MISCELLANEOUS) ×1 IMPLANT
DERMABOND ADVANCED .7 DNX12 (GAUZE/BANDAGES/DRESSINGS) ×1 IMPLANT
DRAPE C-ARM 42X120 X-RAY (DRAPES) ×1 IMPLANT
ELECT REM PT RETURN 9FT ADLT (ELECTROSURGICAL) ×1
ELECTRODE REM PT RTRN 9FT ADLT (ELECTROSURGICAL) ×1 IMPLANT
GLOVE BIO SURGEON STRL SZ8 (GLOVE) ×1 IMPLANT
GLOVE BIOGEL PI IND STRL 7.0 (GLOVE) IMPLANT
GLOVE BIOGEL PI IND STRL 8 (GLOVE) ×1 IMPLANT
GLOVE SURG SS PI 6.5 STRL IVOR (GLOVE) IMPLANT
GOWN STRL REUS W/ TWL LRG LVL3 (GOWN DISPOSABLE) ×2 IMPLANT
GOWN STRL REUS W/ TWL XL LVL3 (GOWN DISPOSABLE) ×1 IMPLANT
GOWN STRL REUS W/TWL LRG LVL3 (GOWN DISPOSABLE) ×3
GOWN STRL REUS W/TWL XL LVL3 (GOWN DISPOSABLE) ×1
KIT BASIN OR (CUSTOM PROCEDURE TRAY) ×1 IMPLANT
KIT TURNOVER KIT B (KITS) ×1 IMPLANT
NS IRRIG 1000ML POUR BTL (IV SOLUTION) ×1 IMPLANT
PAD ARMBOARD 7.5X6 YLW CONV (MISCELLANEOUS) ×1 IMPLANT
POUCH RETRIEVAL ECOSAC 10 (ENDOMECHANICALS) ×1 IMPLANT
POUCH RETRIEVAL ECOSAC 10MM (ENDOMECHANICALS) ×1
SCISSORS LAP 5X35 DISP (ENDOMECHANICALS) ×1 IMPLANT
SET CHOLANGIOGRAPH 5 50 .035 (SET/KITS/TRAYS/PACK) ×1 IMPLANT
SET IRRIG TUBING LAPAROSCOPIC (IRRIGATION / IRRIGATOR) ×1 IMPLANT
SET TUBE SMOKE EVAC HIGH FLOW (TUBING) ×1 IMPLANT
SLEEVE ENDOPATH XCEL 5M (ENDOMECHANICALS) ×1 IMPLANT
SPECIMEN JAR SMALL (MISCELLANEOUS) ×1 IMPLANT
SUT MNCRL AB 4-0 PS2 18 (SUTURE) ×1 IMPLANT
TOWEL GREEN STERILE (TOWEL DISPOSABLE) ×1 IMPLANT
TOWEL GREEN STERILE FF (TOWEL DISPOSABLE) ×1 IMPLANT
TRAY LAPAROSCOPIC MC (CUSTOM PROCEDURE TRAY) ×1 IMPLANT
TROCAR XCEL BLUNT TIP 100MML (ENDOMECHANICALS) ×1 IMPLANT
TROCAR XCEL NON-BLD 11X100MML (ENDOMECHANICALS) ×1 IMPLANT
TROCAR Z-THREAD OPTICAL 5X100M (TROCAR) ×1 IMPLANT
WARMER LAPAROSCOPE (MISCELLANEOUS) ×1 IMPLANT
WATER STERILE IRR 1000ML POUR (IV SOLUTION) ×1 IMPLANT

## 2022-11-30 NOTE — TOC Progression Note (Signed)
Transition of Care (TOC) - Progression Note    Patient Details  Name: [name] [name]. MRN: [id number] Date of Birth: [birth year]  Transition of Care [facility]) CM/SW [place], [place] Phone Number: 11/30/2022, 3:05 PM  Clinical Narrative:     CSW met with pt and provided SNF bed offer. Explained that CSW was unable to reach [facility] at number listed. Pt consented to CSW calling his ex spouse, [name], instead.   CSW called [name] and provided update. Pt only has one bed offer. CSW explained pt's Christella Scheuermann medicaid is barrier as it is out of network with most facilities in the are. [name] explains that pt doesn't have [employer] anymore and now has [employer]. She notified CSW that she will contacts CSW and provide [employer].   CSW received call back from [place] who explained that pt was originally with [facility] but had received a spam call and was tricked into changing his medicare to [place]. She states she was able to switch him back to [facility] about 3 weeks ago but is waiting on a card. She just called [employer] to get his Mcarthur Rossetti number but was provided his old number and his new number but she is unsure which one is which. She provides the numbers she received. [phone]  [phone]  CSW will refax SNF referrals in [place] area.   Expected Discharge Plan: Skilled Nursing Facility Barriers to Discharge: Ship broker, Continued Medical Work up  Expected Discharge Plan and Services                                               Social Determinants of Health (SDOH) Interventions SDOH Screenings   Food Insecurity: No Food Insecurity (01/11/2018)  Transportation Needs: No Transportation Needs (01/11/2018)  Depression (PHQ2-9): Low Risk  (06/17/2022)  Financial Resource Strain: Low Risk  (01/11/2018)  Physical Activity: Inactive (01/11/2018)  Social Connections: Moderately Isolated (01/11/2018)  Stress: No Stress Concern Present (01/11/2018)  Tobacco Use: Medium Risk  (11/30/2022)    Readmission Risk Interventions     No data to display

## 2022-11-30 NOTE — Anesthesia Preprocedure Evaluation (Addendum)
Anesthesia Evaluation  Patient identified by MRN, date of birth, ID band Patient awake    Reviewed: Allergy & Precautions, NPO status , Patient's Chart, lab work & pertinent test results, reviewed documented beta blocker date and time   Airway Mallampati: III  TM Distance: >3 FB Neck ROM: Full    Dental  (+) Dental Advisory Given, Poor Dentition   Pulmonary former smoker Quit smoking 2021   Pulmonary exam normal breath sounds clear to auscultation       Cardiovascular hypertension, Pt. on medications Normal cardiovascular exam+ dysrhythmias (in Afib with RVR on arrival (new)- staretd on full dose lovenox (LD 1/21)) Atrial Fibrillation  Rhythm:Regular Rate:Normal     Neuro/Psych  PSYCHIATRIC DISORDERS  Depression   Dementia CVA (L hemiparesis), Residual Symptoms    GI/Hepatic Neg liver ROS, PUD,GERD  Medicated and Controlled,,Biliary pancreatitis   Dysphagia- coretrack   Endo/Other  diabetes, Poorly Controlled, Type 2  Not on home insulin FS this AM 322- 5 units given on floor 1/13 presented to ED in DKA after family found pt down- initial glucose 1100  Renal/GU negative Renal ROS  negative genitourinary   Musculoskeletal negative musculoskeletal ROS (+)    Abdominal   Peds  Hematology  (+) Blood dyscrasia, anemia Hb 10.5, plt 159   Anesthesia Other Findings Sepsis on arrival 2/2 UTI- currently on IV ancef q8h (LD 2140)  Reproductive/Obstetrics negative OB ROS                             Anesthesia Physical Anesthesia Plan  ASA: 4  Anesthesia Plan: General   Post-op Pain Management: Ofirmev IV (intra-op)*   Induction: Intravenous and Rapid sequence  PONV Risk Score and Plan: 3 and Ondansetron and Treatment may vary due to age or medical condition  Airway Management Planned: Oral ETT  Additional Equipment: None  Intra-op Plan:   Post-operative Plan: Extubation in  OR  Informed Consent: I have reviewed the patients History and Physical, chart, labs and discussed the procedure including the risks, benefits and alternatives for the proposed anesthesia with the patient or authorized representative who has indicated his/her understanding and acceptance.     Dental advisory given and Consent reviewed with POA  Plan Discussed with: CRNA  Anesthesia Plan Comments:         Anesthesia Quick Evaluation

## 2022-11-30 NOTE — Anesthesia Postprocedure Evaluation (Signed)
Anesthesia Post Note  Patient: [name] [name].  Procedure(s) Performed: LAPAROSCOPIC CHOLECYSTECTOMY WITH INTRAOPERATIVE CHOLANGIOGRAM (Abdomen)     Patient location during evaluation: PACU Anesthesia Type: General Level of consciousness: awake and alert, oriented and patient cooperative Pain management: pain level controlled Vital Signs Assessment: post-procedure vital signs reviewed and stable Respiratory status: spontaneous breathing, nonlabored ventilation and respiratory function stable Cardiovascular status: blood pressure returned to baseline and stable Postop Assessment: no apparent nausea or vomiting Anesthetic complications: no   No notable events documented.  Last Vitals:  Vitals:   11/30/22 1240 11/30/22 1245  BP: (!) 151/76 (!) 144/72  Pulse: 72 64  Resp: 20 (!) 23  Temp:  36.5 C  SpO2: 91% 94%    Last Pain:  Vitals:   11/30/22 1245  TempSrc:   PainSc: [place]

## 2022-11-30 NOTE — Progress Notes (Signed)
SLP Cancellation Note  Patient Details Name: [name] [name]. MRN: [id number] DOB: 1960-12-10   Cancelled treatment:       Reason Eval/Treat Not Completed: Patient at procedure or test/unavailable;Other (comment) (Undergoing laparoscopic cholecystectomy. SLP will f/u later this date or next date.)   Sonia Baller, MA, CCC-SLP Speech Therapy

## 2022-11-30 NOTE — Transfer of Care (Signed)
Immediate Anesthesia Transfer of Care Note  Patient: [name] [name].  Procedure(s) Performed: LAPAROSCOPIC CHOLECYSTECTOMY WITH INTRAOPERATIVE CHOLANGIOGRAM (Abdomen)  Patient Location: PACU  Anesthesia Type:General  Level of Consciousness: awake and alert   Airway & Oxygen Therapy: Patient Spontanous Breathing and Patient connected to nasal cannula oxygen  Post-op Assessment: Report given to RN, Post -op Vital signs reviewed and stable, and Patient moving all extremities X 4  Post vital signs: Reviewed and stable  Last Vitals:  Vitals Value Taken Time  BP 151/76 11/30/22 1240  Temp    Pulse 64 11/30/22 1244  Resp 23 11/30/22 1244  SpO2 94 % 11/30/22 1244  Vitals shown include unvalidated device data.  Last Pain:  Vitals:   11/30/22 0955  TempSrc: Oral  PainSc:       Patients Stated Pain Goal: 0 (56/86/16 8372)  Complications: No notable events documented.

## 2022-11-30 NOTE — Progress Notes (Signed)
OT Cancellation Note  Patient Details Name: [name] [name]. MRN: [id number] DOB: 03-Jun-1961   Cancelled Treatment:    Reason Eval/Treat Not Completed: Patient at procedure or test/ unavailable (Pt currently unavailable to participate in OT evaluation. Undergoing laparoscopic cholecystectomy. Will re-attempt tx at a later time when able.)  Ailene Ravel, OTR/L,CBIS  Supplemental OT - [facility] and [facility] Secure Chat Preferred   11/30/2022, 9:57 AM

## 2022-11-30 NOTE — Progress Notes (Signed)
Patient CBG 322 [name] [name] informed via chat that no insulin scheduled and patient is going to surgery this am. New order received

## 2022-11-30 NOTE — Progress Notes (Signed)
PROGRESS NOTE  [name] [name].  DOB: 1961/10/11  PCP: Sandrea Hughs, NP GYI:[id number]  DOA: 11/20/2022  LOS: 9 days  Hospital Day: 11  Brief narrative: [name] Nyman. is a 62 y.o. male with PMH significant for DM2, HTN, HLD, previous right CVA with left-sided deficits, dysphagia, falls who lives at home and uses walker at baseline. 1/13, patient was brought to the ED from home after he was found on the floor.  Last seen normal was 2 days prior.  Per family, patient had been lethargic for about a week.  They believe it was after he started having diarrhea due to laxative that he uses for chronic constipation.  1/9, patient had a fall at home. Per EMS, at the scene, blood pressure was low at 90/56  In the ED, patient was in A-fib with RVR at 140s, initial blood pressure was low at 78/54 Labs with glucose level 1100, anion gap 29, pH low at 7.3, serum bicarb 14, beta-hydroxybutyrate level elevated to more than 8.  WBC count 16, lactic acid 3.2, sodium level elevated to 153, potassium elevated to 6.9, creatinine elevated 5.5 Patient was started on IV fluid, IV insulin drip per DKA protocol Chest x-ray unremarkable Admitted to ICU Blood culture sent on admission showed multi species growth including Proteus Mirabella's, Staph epidermidis and lactobacillus species. Urine culture showed 50,000 CFU per mL of E. coli 1/19, transferred out to [facility]  Subjective: Patient was seen and examined this morning.  Not in distress.  Was waiting for lap chole. Underwent lap chole later this morning.  Assessment and plan: Severe sepsis - POA Multi species bacteremia and UTI On admission, patient elevated WBC count, elevated lactic acid and low blood pressure.  Met criteria for severe sepsis.   Unclear if the multi species growth in blood and urine are significant  In any case, patient is clinically improving on IV antibiotics.   Currently on IV Ancef.  Continue same  Recent Labs  Lab  11/25/22 0415 11/26/22 0515 11/27/22 0250 11/28/22 0442 11/29/22 0336  WBC 9.0 7.6 7.1 7.1 8.6    DKA Initial labs as above showed DKA Improved with IV insulin drip, IV fluid and electrolyte management. Subsequently switched to subcu insulin.  Type 2 diabetes mellitus uncontrolled  hypOglycemia A1c uncontrolled at 13.5 on 11/21/2022 PTA not on meds. Patient was requiring a lot of insulin while getting tube feeding. He pulled out NG tube and remained on dysphagia diet only.  N.p.o. this morning preop. Insulin level being adjusted based on blood sugar trend Given 25 units of Semglee this morning.  Also on 10 units 3 times daily Premeal scheduled short acting insulin.  Continue sliding scale insulin. Recent Labs  Lab 11/29/22 2340 11/30/22 0503 11/30/22 0808 11/30/22 1031 11/30/22 1244  GLUCAP 352* 322* 247* 204* 192*    Acute cholecystitis and cholelithiasis Acute biliary pancreatitis Initial lipase level elevated to more than 1000.   1/14, CT abdomen showed acute interstitial pancreatitis with minimal nonlocalizing peripancreatic fluid, without abscess or free air.  Also showed cholelithiasis without acute cholecystitis Adequately hydrated. Lipase level gradually trended down. 1/19, right upper quadrant ultrasound showed gallstones and acute cholecystitis General surgery consult obtained.  This morning, patient underwent lap chole with intraoperative cholangiogram. Recent Labs  Lab 11/25/22 0415 11/25/22 0526 11/26/22 0515 11/27/22 0250 11/28/22 0442 11/28/22 0716 11/29/22 0336  AST  --  59*  --  150*  --  93* 49*  ALT  --  24  --  73*  --  46* 32  ALKPHOS  --  125  --  315*  --  314* 248*  BILITOT  --  0.4  --  0.2*  --  0.4 0.3  PROT  --  5.9*  --  5.9*  --  6.5 6.1*  ALBUMIN  --  1.8*  --  1.7*  --  1.9* 1.7*  LIPASE  --   --   --  119*  --   --  79*  PLT 78*  --  95* 126* 136*  --  159    AKI Initial creatinine was elevated to 5.5 due to sepsis, DKA,  pancreatitis Creatinine gradually improved, normal at 1.24 today.  Continue to monitor Recent Labs    11/22/22 1230 11/22/22 1633 11/23/22 0455 11/23/22 1510 11/24/22 0327 11/25/22 0526 11/26/22 0515 11/27/22 0250 11/28/22 0716 11/29/22 0336  BUN 83* 80* 63* 54* 36* 33* 31* 32* 24* 16  CREATININE 3.36* 2.75* 2.03* 1.85* 1.28* 1.45* 1.24 1.36* 1.11 0.88    Hypernatremia Sodium level was initially elevated and peaked at 163.  Gradually improved with hydration.   Recent Labs  Lab 11/23/22 1510 11/24/22 0327 11/25/22 0526 11/26/22 0515 11/27/22 0250 11/28/22 0716 11/29/22 0336  NA 152* 141 144 149* 150* 149* 142    A-fib with RVR New onset A-fib in the setting of sepsis, hypovolemia PTA not on any AV node blocking agent. Currently in normal sinus rhythm.  CHADSVASC score elevated to 5 (HTN, HLD, DM2, stroke).   1/19, started on full dose Lovenox.  Currently on hold because of surgery.  Will resume tomorrow if okay with general surgery.  Plan to switch to oral anticoagulant at discharge.  Essential hypertension PTA on amlodipine 10 mg daily, hydralazine 25 mg 3 times daily, clonidine as needed,  Currently on amlodipine 10 mg daily.  Acute encephalopathy Impaired mentation likely due to combination of sepsis, DKA, pancreatitis and in the background of history of stroke Initial CT head did not show any acute intracranial abnormality Continue to monitor mental status change  Recent right CVA with left-sided deficits and dysphagia HLD CT scan of head 1/13 showed a large remote right MCA distribution infarct with additional remote lacunar infarct at the left basal ganglia. Patient states, he was taking aspirin 81 mg daily and Lipitor 40 mg daily.  Since has been started on anticoagulation, no need to resume aspirin.  Lipitor on hold because of elevated liver enzymes Continue meclizine PRN for dizziness  Dysphagia While in ICU, patient was started on core track feeding.   Patient pulled it out on 1/20. Last seen by speech on 1/18.  On dysphagia diet.  Speech therapy to follow-up.  Prolonged qtc Initial EKG with QTc prolonged to 509 ms probably because of RVR an abnormal electrolytes  Repeat EKG 1/19 showed QTc 426 ms. Continue to monitor  GERD Continue PPI  Mild acute anemia Hb >10. No active bleeding. Continue to monitor. Recent Labs    11/25/22 0415 11/26/22 0515 11/27/22 0250 11/28/22 0442 11/29/22 0336  HGB 11.7* 11.4* 10.5* 9.3* 10.5*  MCV 90.7 90.2 92.9 94.1 90.1    Major depressive disorder PTA on fluoxetine.    Impaired mobility Has baseline left-sided deficits after the stroke.  Current acute weakness related to acute comorbidities and prolonged hospitalization. PT eval recommended SNF TOC following   Goals of care   Code Status: Full Code     DVT prophylaxis: Full dose Lovenox to resume if okay with general  surgery   Antimicrobials: IV Ancef Fluid: None Consultants: None Family Communication: None at bedside  Scheduled Meds:  [MAR Hold] amLODipine  10 mg Per Tube Daily   [MAR Hold] Chlorhexidine Gluconate Cloth  6 each Topical Q0600   [MAR Hold] Chlorhexidine Gluconate Cloth  6 each Topical Q0600   [MAR Hold] insulin aspart  0-5 Units Subcutaneous QHS   [MAR Hold] insulin aspart  0-9 Units Subcutaneous TID WC   [MAR Hold] insulin aspart  10 Units Subcutaneous TID WC   [MAR Hold] insulin glargine-yfgn  25 Units Subcutaneous BID   [MAR Hold] mupirocin ointment  1 Application Nasal BID   [MAR Hold] pantoprazole (PROTONIX) IV  40 mg Intravenous Daily   [MAR Hold] sodium chloride flush  10-40 mL Intracatheter Q12H    PRN meds: [MAR Hold] sodium chloride, amisulpride, ceFAZolin, [MAR Hold] dextrose, [MAR Hold] docusate, [MAR Hold] hydrALAZINE, HYDROmorphone (DILAUDID) injection, [MAR Hold] metoprolol tartrate, ondansetron (ZOFRAN) IV, [MAR Hold] mouth rinse, oxyCODONE **OR** oxyCODONE, [MAR Hold] polyethylene glycol, [MAR  Hold] sodium chloride flush, [MAR Hold] white petrolatum   Infusions:   [MAR Hold] sodium chloride 10 mL/hr at 11/26/22 0452   ceFAZolin     lactated ringers 10 mL/hr at 11/30/22 1054    Antimicrobials: Anti-infectives (From admission, onward)    Start     Dose/Rate Route Frequency Ordered Stop   11/30/22 1041  ceFAZolin (ANCEF) 2-4 GM/100ML-% IVPB       Note to Pharmacy: Humberto Leep O: cabinet override      11/30/22 1041 11/30/22 2259   11/24/22 2200  ceFAZolin (ANCEF) IVPB 2g/100 mL premix        2 g 200 mL/hr over 30 Minutes Intravenous Every 8 hours 11/24/22 1443 11/29/22 2210   11/23/22 2200  ceFAZolin (ANCEF) IVPB 2g/100 mL premix  Status:  Discontinued        2 g 200 mL/hr over 30 Minutes Intravenous Every 8 hours 11/23/22 0923 11/24/22 1443   11/21/22 2200  ceFEPIme (MAXIPIME) 2 g in sodium chloride 0.9 % 100 mL IVPB  Status:  Discontinued        2 g 200 mL/hr over 30 Minutes Intravenous Every 24 hours 11/20/22 2257 11/23/22 0923   11/21/22 0800  metroNIDAZOLE (FLAGYL) IVPB 500 mg  Status:  Discontinued        500 mg 100 mL/hr over 60 Minutes Intravenous Every 12 hours 11/21/22 0007 11/22/22 0821   11/20/22 2359  fluconazole (DIFLUCAN) IVPB 200 mg  Status:  Discontinued        200 mg 100 mL/hr over 60 Minutes Intravenous Daily at bedtime 11/20/22 2328 11/22/22 0821   11/20/22 2256  vancomycin variable dose per unstable renal function (pharmacist dosing)  Status:  Discontinued         Does not apply See admin instructions 11/20/22 2256 11/22/22 0821   11/20/22 2030  ceFEPIme (MAXIPIME) 2 g in sodium chloride 0.9 % 100 mL IVPB        2 g 200 mL/hr over 30 Minutes Intravenous  Once 11/20/22 2022 11/20/22 2329   11/20/22 2030  metroNIDAZOLE (FLAGYL) IVPB 500 mg        500 mg 100 mL/hr over 60 Minutes Intravenous  Once 11/20/22 2022 11/20/22 2329   11/20/22 2030  vancomycin (VANCOCIN) IVPB 1000 mg/200 mL premix  Status:  Discontinued        1,000 mg 200 mL/hr over 60  Minutes Intravenous  Once 11/20/22 2022 11/20/22 2024   11/20/22 2030  vancomycin (  VANCOREADY) IVPB 2000 mg/400 mL        2,000 mg 200 mL/hr over 120 Minutes Intravenous  Once 11/20/22 2024 11/21/22 0044       Skin assessment:      Diet:  Diet Order             Diet NPO time specified  Diet effective now                   Nutritional status:  Body mass index is 34.76 kg/m.  Nutrition Problem: Inadequate oral intake Etiology: inability to eat Signs/Symptoms: NPO status      Status is: Inpatient Level of care: Med-Surg  Continue in-hospital care because: Cholecystectomy POD 0  Dispo: The patient is from: Home              Anticipated d/c is to: SNF              Patient currently is not medically stable to d/c.   Difficult to place patient No   Objective: Vitals:   11/30/22 1315 11/30/22 1332  BP: (!) 140/73 138/78  Pulse: 64 66  Resp: 17   Temp: 97.7 F (36.5 C)   SpO2: 96% 100%    Intake/Output Summary (Last 24 hours) at 11/30/2022 1333 Last data filed at 11/30/2022 1229 Gross per 24 hour  Intake 1880 ml  Output 1320 ml  Net 560 ml    Filed Weights   11/27/22 0500 11/28/22 0500 11/29/22 0510  Weight: 119.2 kg 120.1 kg 122.8 kg   Weight change:  Body mass index is 34.76 kg/m.   Physical Exam: General exam: Pleasant, elderly Caucasian male.  Propped up in bed.  Not in distress Skin: No rashes, lesions or ulcers. HEENT: Atraumatic, normocephalic, no obvious bleeding.  Lungs: Clear to auscultation bilaterally CVS: Regular rate and rhythm, no murmur GI/Abd soft, distended from obesity, right upper quadrant tenderness present, bowel sound present CNS: Alert, awake, oriented to place and person.  Left-sided deficits at baseline Psychiatry: Sad affect Extremities: No pedal edema.  No tenderness  Data Review: I have personally reviewed the laboratory data and studies available.  F/u labs ordered Unresulted Labs (From admission, onward)      Start     Ordered   12/01/22 0500  CBC with Differential/Platelet  Tomorrow morning,   R       Question:  Specimen collection method  Answer:  IV Team=IV Team collect   11/30/22 0841   12/01/22 0500  Comprehensive metabolic panel  Tomorrow morning,   R       Question:  Specimen collection method  Answer:  IV Team=IV Team collect   11/30/22 0841   12/01/22 0500  Lipase, blood  Tomorrow morning,   R       Question:  Specimen collection method  Answer:  IV Team=IV Team collect   11/30/22 0841   12/01/22 0500  Magnesium  Tomorrow morning,   R       Question:  Specimen collection method  Answer:  IV Team=IV Team collect   11/30/22 0841   12/01/22 0500  Phosphorus  Tomorrow morning,   R       Question:  Specimen collection method  Answer:  IV Team=IV Team collect   11/30/22 0841            Total time spent in review of labs and imaging, patient evaluation, formulation of plan, documentation and communication with family: 35 minutes  Signed, Terrilee Croak, MD  [facility] 11/30/2022 Mild acute anemia mild acute anemia thrombocytopenia

## 2022-11-30 NOTE — Op Note (Signed)
Laparoscopic Cholecystectomy with IOC Procedure Note  Indications: This patient presents with symptomatic gallbladder disease and will undergo laparoscopic cholecystectomy. Pt has recover from gallstone pancreatitis and pt /family wanted to proceed with cholecystectomy to prevent future recurrence. The procedure has been discussed with the patient. Operative and non operative treatments have been discussed. Risks of surgery include bleeding, infection,  Common bile duct injury,  Injury to the stomach,liver, colon,small intestine, abdominal wall,  Diaphragm,  Major blood vessels,  And the need for an open procedure.  Other risks include worsening of medical problems, death,  DVT and pulmonary embolism, and cardiovascular events.   Medical options have also been discussed. The patient has been informed of long term expectations of surgery and non surgical options,  The patient agrees to proceed.    Pre-operative Diagnosis: gallstone pancreatitis  Post-operative Diagnosis: same    Surgeon: Turner Daniels MD   Assistants: OR staff  Anesthesia: General endotracheal anesthesia and Local anesthesia 0.25.% bupivacaine  ASA Class: 3  Procedure Details  The patient was seen again in the Holding Room. The risks, benefits, complications, treatment options, and expected outcomes were discussed with the patient. The possibilities of reaction to medication, pulmonary aspiration, perforation of viscus, bleeding, recurrent infection, finding a normal gallbladder, the need for additional procedures, failure to diagnose a condition, the possible need to convert to an open procedure, and creating a complication requiring transfusion or operation were discussed with the patient. The patient and/or family concurred with the proposed plan, giving informed consent. The site of surgery properly noted/marked. The patient was taken to Operating Room, identified as [name] [name]. and the procedure verified as  Laparoscopic Cholecystectomy with Intraoperative Cholangiograms. A Time Out was held and the above information confirmed.  Prior to the induction of general anesthesia, antibiotic prophylaxis was administered. General endotracheal anesthesia was then administered and tolerated well. After the induction, the abdomen was prepped in the usual sterile fashion. The patient was positioned in the supine position with the left arm comfortably tucked, along with some reverse Trendelenburg.  Local anesthetic agent was injected into the skin near the umbilicus and an incision made. The midline fascia was incised and the Hasson technique was used to introduce a 12 mm port under direct vision. It was secured with a figure of eight Vicryl suture placed in the usual fashion. Pneumoperitoneum was then created with CO2 and tolerated well without any adverse changes in the patient's vital signs. Additional trocars were introduced under direct vision with an 11 mm trocar in the epigastrium and 2 5 mm trocars in the right upper quadrant. All skin incisions were infiltrated with a local anesthetic agent before making the incision and placing the trocars.   The gallbladder was identified, the fundus grasped and retracted cephalad. Adhesions were lysed bluntly and with the electrocautery where indicated, taking care not to injure any adjacent organs or viscus. The infundibulum was grasped and retracted laterally, exposing the peritoneum overlying the triangle of Calot. This was then divided and exposed in a blunt fashion. The cystic duct was clearly identified and bluntly dissected circumferentially. The junctions of the gallbladder, cystic duct and common bile duct were clearly identified prior to the division of any linear structure.   An incision was made in the cystic duct and the cholangiogram catheter introduced. The catheter was secured using an endoclip. The study showed no stones and good visualization of the distal and  proximal biliary tree.There was some extravasation noted but proximal and distal  ducts were visualized and on stone noted.  The catheter was then removed.   The cystic duct was then  ligated with surgical clips  on the patient side and  clipped on the gallbladder side and divided. The cystic artery was identified, dissected free, ligated with clips and divided as well. Posterior cystic artery clipped and divided.  The gallbladder was dissected from the liver bed in retrograde fashion with the electrocautery. The gallbladder was removed. The liver bed was irrigated and inspected. Hemostasis was achieved with the electrocautery. Copious irrigation was utilized and was repeatedly aspirated until clear all particulate matter. Hemostasis was achieved with no signs  Of bleeding or bile leakage.  Pneumoperitoneum was completely reduced after viewing removal of the trocars under direct vision. The wound was thoroughly irrigated and the fascia was then closed with a figure of eight suture; the skin was then closed with 4 O monocryl  and a sterile dressing was applied of Dermabond.  Instrument, sponge, and needle counts were correct at closure and at the conclusion of the case.   Findings:   Cholelithiasis  Estimated Blood Loss: less than 50 mL         Drains: none         Total IV Fluids:per record          Specimens: Gallbladder           Complications: None; patient tolerated the procedure well.         Disposition: PACU - hemodynamically stable.         Condition: stable

## 2022-11-30 NOTE — Interval H&P Note (Signed)
History and Physical Interval Note:  11/30/2022 10:13 AM  [name] [name].  has presented today for surgery, with the diagnosis of Biliary Pancreatitis.  The various methods of treatment have been discussed with the patient and family. After consideration of risks, benefits and other options for treatment, the patient has consented to  Procedure(s): LAPAROSCOPIC CHOLECYSTECTOMY WITH INTRAOPERATIVE CHOLANGIOGRAM (N/A) as a surgical intervention.  The patient's history has been reviewed, patient examined, no change in status, stable for surgery.  I have reviewed the patient's chart and labs.  Questions were answered to the patient's satisfaction.   The procedure has been discussed with the patient. Operative and non operative treatments have been discussed. Risks of surgery include bleeding, infection,  Common bile duct injury,  Injury to the stomach,liver, colon,small intestine, abdominal wall,  Diaphragm,  Major blood vessels,  And the need for an open procedure.  Other risks include worsening of medical problems, death,  DVT and pulmonary embolism, and cardiovascular events.   Medical options have also been discussed. The patient has been informed of long term expectations of surgery and non surgical options,  The patient agrees to proceed.     [place]

## 2022-11-30 NOTE — Anesthesia Procedure Notes (Addendum)
Procedure Name: Intubation Date/Time: 11/30/2022 11:05 AM  Performed by: Harden Mo, CRNAPre-anesthesia Checklist: Patient identified, Emergency Drugs available, Suction available and Patient being monitored Patient Re-evaluated:Patient Re-evaluated prior to induction Oxygen Delivery Method: Circle System Utilized Preoxygenation: Pre-oxygenation with 100% oxygen Induction Type: IV induction and Rapid sequence Laryngoscope Size: Miller and 2 Grade View: Grade I Tube type: Oral Tube size: 7.5 mm Number of attempts: 1 Airway Equipment and Method: Stylet and Oral airway Placement Confirmation: ETT inserted through vocal cords under direct vision, positive ETCO2 and breath sounds checked- equal and bilateral Secured at: 23 cm Tube secured with: Tape Dental Injury: Teeth and Oropharynx as per pre-operative assessment

## 2022-12-01 ENCOUNTER — Other Ambulatory Visit (HOSPITAL_COMMUNITY): Payer: Self-pay

## 2022-12-01 ENCOUNTER — Encounter (HOSPITAL_COMMUNITY): Payer: Self-pay | Admitting: Surgery

## 2022-12-01 DIAGNOSIS — E1110 Type 2 diabetes mellitus with ketoacidosis without coma: Secondary | ICD-10-CM | POA: Diagnosis not present

## 2022-12-01 LAB — CBC WITH DIFFERENTIAL/PLATELET
Abs Immature Granulocytes: 0.05 10*3/uL (ref 0.00–0.07)
Basophils Absolute: 0 10*3/uL (ref 0.0–0.1)
Basophils Relative: 0 %
Eosinophils Absolute: 0.1 10*3/uL (ref 0.0–0.5)
Eosinophils Relative: 1 %
HCT: 31.3 % — ABNORMAL LOW (ref 39.0–52.0)
Hemoglobin: 9.8 g/dL — ABNORMAL LOW (ref 13.0–17.0)
Immature Granulocytes: 1 %
Lymphocytes Relative: 17 %
Lymphs Abs: 1.5 10*3/uL (ref 0.7–4.0)
MCH: 28.8 pg (ref 26.0–34.0)
MCHC: 31.3 g/dL (ref 30.0–36.0)
MCV: 92.1 fL (ref 80.0–100.0)
Monocytes Absolute: 0.6 10*3/uL (ref 0.1–1.0)
Monocytes Relative: 7 %
Neutro Abs: 6.6 10*3/uL (ref 1.7–7.7)
Neutrophils Relative %: 74 %
Platelets: 191 10*3/uL (ref 150–400)
RBC: 3.4 MIL/uL — ABNORMAL LOW (ref 4.22–5.81)
RDW: 14.8 % (ref 11.5–15.5)
WBC: 8.8 10*3/uL (ref 4.0–10.5)
nRBC: 0 % (ref 0.0–0.2)

## 2022-12-01 LAB — GLUCOSE, CAPILLARY
Glucose-Capillary: 207 mg/dL — ABNORMAL HIGH (ref 70–99)
Glucose-Capillary: 233 mg/dL — ABNORMAL HIGH (ref 70–99)
Glucose-Capillary: 298 mg/dL — ABNORMAL HIGH (ref 70–99)
Glucose-Capillary: 324 mg/dL — ABNORMAL HIGH (ref 70–99)
Glucose-Capillary: 325 mg/dL — ABNORMAL HIGH (ref 70–99)
Glucose-Capillary: 410 mg/dL — ABNORMAL HIGH (ref 70–99)

## 2022-12-01 LAB — COMPREHENSIVE METABOLIC PANEL
ALT: 27 U/L (ref 0–44)
AST: 56 U/L — ABNORMAL HIGH (ref 15–41)
Albumin: 1.7 g/dL — ABNORMAL LOW (ref 3.5–5.0)
Alkaline Phosphatase: 207 U/L — ABNORMAL HIGH (ref 38–126)
Anion gap: 8 (ref 5–15)
BUN: 19 mg/dL (ref 8–23)
CO2: 26 mmol/L (ref 22–32)
Calcium: 7.9 mg/dL — ABNORMAL LOW (ref 8.9–10.3)
Chloride: 105 mmol/L (ref 98–111)
Creatinine, Ser: 1.02 mg/dL (ref 0.61–1.24)
GFR, Estimated: >60 mL/min (ref >60)
Glucose, Bld: 316 mg/dL — ABNORMAL HIGH (ref 70–99)
Potassium: 4.4 mmol/L (ref 3.5–5.1)
Sodium: 139 mmol/L (ref 135–145)
Total Bilirubin: 0.3 mg/dL (ref 0.3–1.2)
Total Protein: 5.9 g/dL — ABNORMAL LOW (ref 6.5–8.1)

## 2022-12-01 LAB — SURGICAL PATHOLOGY

## 2022-12-01 LAB — PHOSPHORUS: Phosphorus: 3.4 mg/dL (ref 2.5–4.6)

## 2022-12-01 LAB — MAGNESIUM: Magnesium: 1.7 mg/dL (ref 1.7–2.4)

## 2022-12-01 LAB — LIPASE, BLOOD: Lipase: 74 U/L — ABNORMAL HIGH (ref 11–51)

## 2022-12-01 MED ORDER — INSULIN GLARGINE-YFGN 100 UNIT/ML ~~LOC~~ SOLN
40.0000 [IU] | Freq: Two times a day (BID) | SUBCUTANEOUS | Status: DC
  Administered 2022-12-01 (×2): 40 [IU] via SUBCUTANEOUS
  Filled 2022-12-01 (×4): qty 0.4

## 2022-12-01 MED ORDER — LIVING WELL WITH DIABETES BOOK
Freq: Once | Status: AC
  Filled 2022-12-01: qty 1

## 2022-12-01 MED ORDER — ENOXAPARIN SODIUM 120 MG/0.8ML IJ SOSY
120.0000 mg | PREFILLED_SYRINGE | Freq: Once | INTRAMUSCULAR | Status: AC
  Administered 2022-12-01: 120 mg via SUBCUTANEOUS
  Filled 2022-12-01: qty 0.8

## 2022-12-01 MED ORDER — INSULIN ASPART 100 UNIT/ML IJ SOLN
5.0000 [IU] | Freq: Once | INTRAMUSCULAR | Status: AC
  Administered 2022-12-01: 5 [IU] via SUBCUTANEOUS

## 2022-12-01 MED ORDER — ENOXAPARIN SODIUM 120 MG/0.8ML IJ SOSY: 120.0000 mg | PREFILLED_SYRINGE | Freq: Two times a day (BID) | INTRAMUSCULAR | Status: DC

## 2022-12-01 NOTE — Inpatient Diabetes Management (Addendum)
Inpatient Diabetes Program Recommendations  AACE/ADA: New Consensus Statement on Inpatient Glycemic Control (2015)  Target Ranges:  Prepandial:   less than 140 mg/dL      Peak postprandial:   less than 180 mg/dL (1-2 hours)      Critically ill patients:  140 - 180 mg/dL   Lab Results  Component Value Date   GLUCAP 324 (H) 12/01/2022   HGBA1C 13.5 (H) 11/21/2022    Review of Glycemic Control  Latest Reference Range & Units 11/30/22 16:07 11/30/22 20:40 12/01/22 00:16 12/01/22 05:02 12/01/22 08:07 12/01/22 11:46  Glucose-Capillary 70 - 99 mg/dL 259 (H) 389 (H) 410 (H) 298 (H) 325 (H) 324 (H)  (H): Data is abnormally high  Diabetes history: DM2 Outpatient Diabetes medications: None Current orders for Inpatient glycemic control:  Levemir 40 units BID (increased today from 25 units BID) Novolog 0-9 units TID and 0-5 units QHS Novolog 10 units TID with meals  Recommendations for Discharge:  Other recommendations: Novolog 10 units TID with meals as well. Long acting recommendations: Insulin Glargine (LANTUS) Solostar Pen 40 units BID  Short acting recommendations: Insulin lispro (Humalog) KwikPen Moderate Scale  Hypoglycemia treatment recommendations: GVoke '1mg'$  Supply/Referral recommendations: Glucometer Test strips Lancet device Lancets Pen needles - standard  Spoke with him at bedside.  He doe not take any DM medications at home.  He has a PCP and has been told he has DM in the past.  Reviewed patient's current A1c of 13.5% (average BG of 341 mg/dL).  Explained what a A1c is and what it measures. Also reviewed goal A1c with patient, importance of good glucose control @ home, and blood sugar goals.  Explained he will need insulin moving forward.  He is unable to use his left hand.  Appears PT is recommending SNF.    Ordered Living Well with Diabetes booklet and it is at bedside.   Discussed importance of eliminating caloric beverages, limiting sweets, educated on carbohydrates  and importance of portion control.    Did not teach insulin pen as he is unable to use his left arm and will likely DC to SNF.  He will need insulin pen teaching at some point before he goes home from SNF.  Asked for benefit check on insulins.  Lantus and Humalog both $35 co-pay.  Will continue to follow while inpatient.  Thank you, Reche Dixon, MSN, [place] Diabetes Coordinator Inpatient Diabetes Program [phone] (team pager from 8a-5p)

## 2022-12-01 NOTE — Progress Notes (Signed)
Occupational Therapy Treatment Patient Details Name: [name] [name]. MRN: [id number] DOB: [birth year] Today's Date: 12/01/2022   History of present illness 62 yo male admitted 1/13 after found on the floor at home. Pt with DKA, hypothermic, hypotensive, AFib with RVR. PMhx: CVA with lt hemiplegia, DM, vascular dementia, HTN, HLD   OT comments  Pt in bed upon therapy arrival and agreeable to participate in OT treatment focusing on sit to stand transitions, bed mobility, functional transfers, and static standing balance/endurance. Pt required multimodel cueing to sequence through and complete bed mobility in order to sit on EOB. RW was initially used during initially sit to stand trial although due to severe left UE flexor synergy, RW was removed to increase safety and focus on trunk control and core stability. Pt demonstrates decreased standing tolerance and fear of falling. Continues to be appropriate for SNF at discharge to focus on mentioned deficits and increase independence with BADL tasks.    Recommendations for follow up therapy are one component of a multi-disciplinary discharge planning process, led by the attending physician.  Recommendations may be updated based on patient status, additional functional criteria and insurance authorization.    Follow Up Recommendations  Skilled nursing-short term rehab (<3 hours/day)     Assistance Recommended at Discharge Frequent or constant Supervision/Assistance  Patient can return home with the following  A lot of help with walking and/or transfers;A lot of help with bathing/dressing/bathroom;Assistance with cooking/housework;Direct supervision/assist for medications management   Equipment Recommendations  Other (comment) (defer to next venue)       Precautions / Restrictions Precautions Precautions: Fall;Other (comment) Precaution Comments: L UE hemiparesis Restrictions Weight Bearing Restrictions: No       Mobility Bed  Mobility Overal bed mobility: Needs Assistance Bed Mobility: Supine to Sit     Supine to sit: Mod assist, HOB elevated     General bed mobility comments: increased time to complete. Multiple verbal, tactile,and visual cues provided to bring BLEs off EOB. Patient Response: Cooperative  Transfers Overall transfer level: Needs assistance Equipment used: Rolling walker (2 wheels) (RW was used for sit to stand initially. Due to LUE flexor synergy presence, RW was not used for 2nd sit to stand and for step pvt transfer.) Transfers: Sit to/from Stand, Bed to chair/wheelchair/BSC Sit to Stand: Mod assist, From elevated surface     Step pivot transfers: Mod assist     General transfer comment: While standing with RW initially, pt picked up RW and placed it way in front of himself. Provided max VC to manage RW safely while standing. Also required max VC to sequence step pivor transfer towards recliner     Balance Overall balance assessment: Needs assistance Sitting-balance support: Feet supported, Single extremity supported Sitting balance-Leahy Scale: Fair Sitting balance - Comments: seated EOB   Standing balance support: Single extremity supported Standing balance-Leahy Scale: Poor Standing balance comment: Pt reliant on OT for support when standing due to left hemiparesis. Heavy left lateral lean present. pt demonstrated hip flexion while standing. Able to self correctly slightly when cued to squeeze glutes and lift trunk up to stand talk. Unable to maintain appropriate upright standing posture. Completed 3 sit to stand trials while standing at EOB.         ADL either performed or assessed with clinical judgement      Cognition Arousal/Alertness: Awake/alert Behavior During Therapy: WFL for tasks assessed/performed Overall Cognitive Status: Difficult to assess           General Comments:  Pt provided with multimodel cues for sequencing during all activities this session.  Complained of dizziness upon initial sitting on EOB and once in recliner.                   Pertinent Vitals/ Pain       Pain Assessment Pain Assessment: No/denies pain         Frequency  Min 2X/week        Progress Toward Goals  OT Goals(current goals can now be found in the care plan section)  Progress towards OT goals: Progressing toward goals     Plan Discharge plan remains appropriate;Frequency remains appropriate       AM-PAC OT "6 Clicks" Daily Activity     Outcome Measure   Help from another person eating meals?: A Little Help from another person taking care of personal grooming?: A Little Help from another person toileting, which includes using toliet, bedpan, or urinal?: A Lot Help from another person bathing (including washing, rinsing, drying)?: A Lot Help from another person to put on and taking off regular upper body clothing?: A Lot Help from another person to put on and taking off regular lower body clothing?: Total 6 Click Score: 13    End of Session Equipment Utilized During Treatment: Rolling walker (2 wheels);Gait belt  OT Visit Diagnosis: Other abnormalities of gait and mobility (R26.89);Unsteadiness on feet (R26.81);Muscle weakness (generalized) (M62.81)   Activity Tolerance Patient tolerated treatment well   Patient Left in chair;with call bell/phone within reach;with chair alarm set           Time: 7517-0017 OT Time Calculation (min): 28 min  Charges: OT General Charges $OT Visit: 1 Visit OT Treatments $Therapeutic Activity: 23-37 mins  Ailene Ravel, OTR/L,CBIS  Supplemental OT - [facility] and [facility] Secure Chat Preferred    Cabrina Shiroma, Clarene Duke 12/01/2022, 1:02 PM

## 2022-12-01 NOTE — Progress Notes (Signed)
ANTICOAGULATION CONSULT NOTE - Initial Consult  Pharmacy Consult for Lovenox Indication: atrial fibrillation  No Known Allergies  Patient Measurements: Weight: 122.7 kg (270 lb 8.1 oz)  Vital Signs: Temp: 99.4 F (37.4 C) (01/24 0715) Temp Source: Oral (01/24 0715) BP: 125/72 (01/24 0715) Pulse Rate: 65 (01/24 0715)  Labs: Recent Labs    11/29/22 0336 12/01/22 0435  HGB 10.5* 9.8*  HCT 31.7* 31.3*  PLT 159 191  CREATININE 0.88 1.02     Estimated Creatinine Clearance: 101.8 mL/min (by C-G formula based on SCr of 1.02 mg/dL).   Medical History: Past Medical History:  Diagnosis Date   Acute respiratory failure ([place])    Cerebral edema ([facility])    Diabetes mellitus without complication ([place])    diet controlled- no meds   Dysphagia as late effect of cerebrovascular disease    Fall at home    Hemiparesis affecting left side as late effect of stroke ([place])    Hyperlipidemia    Hypertension    Stroke ([place]) 2016   Tuberculosis    11 yrs ago per pt.- treated with meds per pt.    Medications:  Medications Prior to Admission  Medication Sig Dispense Refill Last Dose   amLODipine (NORVASC) 10 MG tablet TAKE 1 TABLET (10 MG TOTAL) BY MOUTH DAILY. I10 (Patient taking differently: Take 10 mg by mouth daily.) 90 tablet 3 11/18/2022   atorvastatin (LIPITOR) 80 MG tablet TAKE 1/2 TABLET BY MOUTH EVERY DAY 45 tablet 3 11/18/2022   cloNIDine (CATAPRES) 0.1 MG tablet TAKE 1 TABLET (0.1 MG TOTAL) BY MOUTH EVERY 8 (EIGHT) HOURS AS NEEDED. (Patient taking differently: Take 0.1 mg by mouth every 6 (six) hours as needed (unknown reason).) 270 tablet 2 unk   hydrALAZINE (APRESOLINE) 25 MG tablet Take 1 tablet (25 mg total) by mouth 3 (three) times daily. 270 tablet 1 11/18/2022   ibuprofen (ADVIL) 800 MG tablet Take 800 mg by mouth as needed for mild pain.   unk   meclizine (ANTIVERT) 25 MG tablet Take 1 tablet (25 mg total) by mouth 3 (three) times daily as needed for dizziness. 30 tablet 0 unk    aspirin 325 MG tablet Take 1 tablet (325 mg total) by mouth daily. (Patient not taking: Reported on 11/22/2022) 30 tablet 0 Not Taking   FLUoxetine (PROZAC) 10 MG capsule TAKE 1 CAPSULE BY MOUTH EVERY DAY (Patient not taking: Reported on 11/22/2022) 90 capsule 1 Not Taking   pantoprazole (PROTONIX) 40 MG tablet Take 1 tablet (40 mg total) by mouth 2 (two) times daily. (Patient not taking: Reported on 11/22/2022) 180 tablet 1 Not Taking    Assessment: 76 yom admitted with sepsis due to UTI and bacteremia. He has new onset Afib with RVR and remote right MCA stroke history. Pharmacy consulted to begin Lovenox for Afib. He had an AKI on admit however Scr is down to 1.24. No bleeding noted, Hgb stable 10-11s, platelets are up to 95 (202 on admit).  Pt is s/p chole. Ok to resume lovenox per MD with plan for apixaban at discharge.   Goal of Therapy:  Anti-Xa level 0.6-1 units/ml 4hrs after LMWH dose given Monitor platelets by anticoagulation protocol: Yes   Plan:  Resume Lovenox 120 mg SQ q12h CBC q72h while on Lovenox Plan is to change to oral agent on discharge  Rx will follow peripherally  Onnie Boer, PharmD, BCIDP, AAHIVP, CPP Infectious Disease Pharmacist 12/01/2022 1:00 PM

## 2022-12-01 NOTE — Progress Notes (Signed)
[facility] Progress Note  1 Day Post-Op  Subjective: CC:  Pain controlled. Tolerating PO. +flatus and Bm this AM.  Objective: Vital signs in last 24 hours: Temp:  [97.7 F (36.5 C)-99.9 F (37.7 C)] 99.4 F (37.4 C) (01/24 0715) Pulse Rate:  [62-73] 65 (01/24 0715) Resp:  [16-23] 16 (01/24 0715) BP: (116-152)/(71-83) 125/72 (01/24 0715) SpO2:  [91 %-100 %] 99 % (01/24 0715) Weight:  [122.7 kg] 122.7 kg (01/24 0504) Last BM Date : 11/29/22  Intake/Output from previous day: 01/23 0701 - 01/24 0700 In: 1300 [P.O.:250; I.V.:1000; IV Piggyback:50] Out: 2070 [Urine:2050; Blood:20] Intake/Output this shift: No intake/output data recorded.  PE: Gen:  Alert, NAD, pleasant Abd: Soft, appropriately tender, incisions c/d/i Skin: warm and dry, no rashes  Psych: A&Ox3   Lab Results:  Recent Labs    11/29/22 0336  WBC 8.6  HGB 10.5*  HCT 31.7*  PLT 159   BMET Recent Labs    11/29/22 0336  NA 142  K 3.4*  CL 109  CO2 26  GLUCOSE 81  BUN 16  CREATININE 0.88  CALCIUM 8.2*   PT/INR No results for input(s): "LABPROT", "INR" in the last 72 hours. CMP     Component Value Date/Time   NA 142 11/29/2022 0336   NA 142 05/07/2016 1134   K 3.4 (L) 11/29/2022 0336   CL 109 11/29/2022 0336   CO2 26 11/29/2022 0336   GLUCOSE 81 11/29/2022 0336   BUN 16 11/29/2022 0336   BUN 13 05/07/2016 1134   CREATININE 0.88 11/29/2022 0336   CREATININE 1.06 06/21/2022 0901   CALCIUM 8.2 (L) 11/29/2022 0336   PROT 6.1 (L) 11/29/2022 0336   PROT 7.3 01/28/2016 0946   ALBUMIN 1.7 (L) 11/29/2022 0336   ALBUMIN 4.5 01/28/2016 0946   AST 49 (H) 11/29/2022 0336   ALT 32 11/29/2022 0336   ALKPHOS 248 (H) 11/29/2022 0336   BILITOT 0.3 11/29/2022 0336   BILITOT 0.2 01/28/2016 0946   GFRNONAA >60 11/29/2022 0336   GFRNONAA 57 (L) 05/22/2020 1031   GFRAA 66 05/22/2020 1031   Lipase     Component Value Date/Time   LIPASE 79 (H) 11/29/2022 0336        Studies/Results: DG Cholangiogram Operative  Result Date: 11/30/2022 CLINICAL DATA:  62 year old male with cholelithiasis EXAM: INTRAOPERATIVE CHOLANGIOGRAM TECHNIQUE: Cholangiographic images from the C-arm fluoroscopic device were submitted for interpretation post-operatively. Please see the procedural report for the amount of contrast and the fluoroscopy time utilized. FLUOROSCOPY: Radiation Exposure Index (as provided by the fluoroscopic device): 33 mGy Kerma COMPARISON:  Ultrasound 11/26/2022 FINDINGS: Surgical instruments project over the upper abdomen. Some extra anatomic contrast present on the study. There is cannulation of the cystic duct/gallbladder neck, with antegrade infusion of contrast. Caliber of the extrahepatic ductal system within normal limits. No definite filling defect within the extrahepatic ducts identified. Free flow of contrast across the ampulla. IMPRESSION: Intraoperative cholangiogram demonstrates extrahepatic biliary ducts of unremarkable caliber, with no definite filling defects identified. Free flow of contrast across the ampulla. Please refer to the dictated operative report for full details of intraoperative findings and procedure Electronically Signed   By: Corrie Mckusick D.O.   On: 11/30/2022 13:34    Anti-infectives: Anti-infectives (From admission, onward)    Start     Dose/Rate Route Frequency Ordered Stop   11/30/22 1041  ceFAZolin (ANCEF) 2-4 GM/100ML-% IVPB       Note to Pharmacy: Humberto Leep O: cabinet override  11/30/22 1041 11/30/22 2259   11/24/22 2200  ceFAZolin (ANCEF) IVPB 2g/100 mL premix        2 g 200 mL/hr over 30 Minutes Intravenous Every 8 hours 11/24/22 1443 11/29/22 2210   11/23/22 2200  ceFAZolin (ANCEF) IVPB 2g/100 mL premix  Status:  Discontinued        2 g 200 mL/hr over 30 Minutes Intravenous Every 8 hours 11/23/22 0923 11/24/22 1443   11/21/22 2200  ceFEPIme (MAXIPIME) 2 g in sodium chloride 0.9 % 100 mL IVPB  Status:   Discontinued        2 g 200 mL/hr over 30 Minutes Intravenous Every 24 hours 11/20/22 2257 11/23/22 0923   11/21/22 0800  metroNIDAZOLE (FLAGYL) IVPB 500 mg  Status:  Discontinued        500 mg 100 mL/hr over 60 Minutes Intravenous Every 12 hours 11/21/22 0007 11/22/22 0821   11/20/22 2359  fluconazole (DIFLUCAN) IVPB 200 mg  Status:  Discontinued        200 mg 100 mL/hr over 60 Minutes Intravenous Daily at bedtime 11/20/22 2328 11/22/22 0821   11/20/22 2256  vancomycin variable dose per unstable renal function (pharmacist dosing)  Status:  Discontinued         Does not apply See admin instructions 11/20/22 2256 11/22/22 0821   11/20/22 2030  ceFEPIme (MAXIPIME) 2 g in sodium chloride 0.9 % 100 mL IVPB        2 g 200 mL/hr over 30 Minutes Intravenous  Once 11/20/22 2022 11/20/22 2329   11/20/22 2030  metroNIDAZOLE (FLAGYL) IVPB 500 mg        500 mg 100 mL/hr over 60 Minutes Intravenous  Once 11/20/22 2022 11/20/22 2329   11/20/22 2030  vancomycin (VANCOCIN) IVPB 1000 mg/200 mL premix  Status:  Discontinued        1,000 mg 200 mL/hr over 60 Minutes Intravenous  Once 11/20/22 2022 11/20/22 2024   11/20/22 2030  vancomycin (VANCOREADY) IVPB 2000 mg/400 mL        2,000 mg 200 mL/hr over 120 Minutes Intravenous  Once 11/20/22 2024 11/21/22 0044        Assessment/Plan  Gallstone pancreatitis S/p laparoscopic cholecystectomy with [facility] 1/23 Dr. Brantley Stage - POD#1, IOC was negative for choledocholithiasis  -  AFVSS, AM labs are pending - DYS 1 diet,advance as tolerated per primary team  Pain: percocet 5-325 mg tablet q 4h PRN, start scheduled tylenol 650 mg q 6h  ID: no further abx needed from general surgery standpoint VTE: SCD's, ok for chemical VTE ppx Dispo: med-surg, advance diet as able, will arrange outpatient surgical follow up  Stable for discharge from ccs standpoint   LOS: 10 days   I reviewed nursing notes, hospitalist notes, last 24 h vitals and pain scores, last 48 h  intake and output, last 24 h labs and trends, and last 24 h imaging results.    Obie Dredge, PA-C [facility] Please see Amion for pager number during day hours 7:00am-4:30pm

## 2022-12-01 NOTE — Progress Notes (Signed)
Patient's last glucose was 410.  Contacted on-call for one time dose of insulin.

## 2022-12-01 NOTE — TOC Progression Note (Addendum)
Transition of Care (TOC) - Progression Note    Patient Details  Name: [name] [name]. MRN: [id number] Date of Birth: 1961/07/31  Transition of Care [facility]) CM/SW [place], [place] Phone Number: 12/01/2022, 11:57 AM  Clinical Narrative:    CSW was advised that pt's [facility] plan does not kick in until the 1st of February, so will need to use his Christella Scheuermann as primary. CSW spoke with [name] who noted understanding. She was given pt's only bed offer, and advised of medicare. Gov info as well as medicare rating. She was also advised that another facility is considering. [name] noted she would like to see if the other facility offers, but if not they will be agreeable to [place]. CSW spoke with [place] who will review pt again. Pt is currently not medically ready, per progression update. CSW to speak with [name] again with any updates. TOC will continue to follow for DC needs.   [facility] not offered on pt, MD states pt will be ready tomorrow. [place] started authorization. TOC will follow.    Expected Discharge Plan: Skilled Nursing Facility Barriers to Discharge: Ship broker, Continued Medical Work up  Expected Discharge Plan and Services                                               Social Determinants of Health (SDOH) Interventions SDOH Screenings   Food Insecurity: No Food Insecurity (01/11/2018)  Transportation Needs: No Transportation Needs (01/11/2018)  Depression (PHQ2-9): Low Risk  (06/17/2022)  Financial Resource Strain: Low Risk  (01/11/2018)  Physical Activity: Inactive (01/11/2018)  Social Connections: Moderately Isolated (01/11/2018)  Stress: No Stress Concern Present (01/11/2018)  Tobacco Use: Medium Risk (12/01/2022)    Readmission Risk Interventions     No data to display

## 2022-12-01 NOTE — Progress Notes (Signed)
PROGRESS NOTE  [name] [name].  DOB: 05/12/1961  PCP: Sandrea Hughs, NP HGD:[id number]  DOA: 11/20/2022  LOS: 10 days  Hospital Day: 12  Brief narrative: [name] [name]. is a 62 y.o. male with PMH significant for DM2, HTN, HLD, previous right CVA with left-sided deficits, dysphagia, falls who lives at home and uses walker at baseline. 1/13, patient was brought to the ED from home after he was found on the floor.  Last seen normal was 2 days prior.  Per family, patient had been lethargic for about a week.  They believe it was after he started having diarrhea due to laxative that he uses for chronic constipation.  1/9, patient had a fall at home. Per EMS, at the scene, blood pressure was low at 90/56  In the ED, patient was in A-fib with RVR at 140s, initial blood pressure was low at 78/54 Labs with glucose level 1100, anion gap 29, pH low at 7.3, serum bicarb 14, beta-hydroxybutyrate level elevated to more than 8.  WBC count 16, lactic acid 3.2, sodium level elevated to 153, potassium elevated to 6.9, creatinine elevated 5.5 Patient was started on IV fluid, IV insulin drip per DKA protocol Chest x-ray unremarkable Admitted to ICU Blood culture sent on admission showed multi species growth including Proteus Mirabella's, Staph epidermidis and lactobacillus species. Urine culture showed 50,000 CFU per mL of E. coli 1/19, transferred out to [facility]  Subjective: Patient was seen and examined this morning.  Not in distress.   Lying on bed.  Not in distress.  No new symptoms.  Underwent lap chole yesterday.  Able to tolerate food.   Assessment and plan: Severe sepsis - POA Multi species bacteremia and UTI On admission, patient elevated WBC count, elevated lactic acid and low blood pressure.  Met criteria for severe sepsis.   Blood culture sent on admission grew multiple species including Proteus mirabilis, Staph epidermidis.  Unclear significance.  Urine culture grew 2000 CFU per mL of  E. coli, unclear significance.   Repeat blood culture sent on 1/14 did not show any growth. In any case, patient is clinically improving on IV antibiotics.   Currently on IV Ancef.  Completed 10-day course on 1/23. Recent Labs  Lab 11/26/22 0515 11/27/22 0250 11/28/22 0442 11/29/22 0336 12/01/22 0435  WBC 7.6 7.1 7.1 8.6 8.8   DKA Initial labs as above showed DKA Improved with IV insulin drip, IV fluid and electrolyte management. Subsequently switched to subcu insulin.  Type 2 diabetes mellitus uncontrolled  hypOglycemia A1c uncontrolled at 13.5 on 11/21/2022 PTA not on meds. Patient was requiring a lot of insulin while getting tube feeding. He pulled out NG tube and remained on dysphagia diet only.   Insulin level being adjusted based on blood sugar trend Currently on Semglee 40 units twice daily, Premeal insulin continues daily and sliding sequentially.  Diabetes care coordinator consult appreciated. Recent Labs  Lab 11/30/22 2040 12/01/22 0016 12/01/22 0502 12/01/22 0807 12/01/22 1146  GLUCAP 389* 410* 298* 325* 324*   Acute cholecystitis and cholelithiasis Acute biliary pancreatitis Initial lipase level elevated to more than 1000.   1/14, CT abdomen showed acute interstitial pancreatitis with minimal nonlocalizing peripancreatic fluid, without abscess or free air.  Also showed cholelithiasis without acute cholecystitis Adequately hydrated. Lipase level gradually trended down. 1/19, right upper quadrant ultrasound showed gallstones and acute cholecystitis General surgery consult obtained.   1/23, patient underwent lap chole with intraoperative cholangiogram. Postop course uneventful.  Able to  tolerate diet. Recent Labs  Lab 11/25/22 0526 11/26/22 0515 11/27/22 0250 11/28/22 0442 11/28/22 0716 11/29/22 0336 12/01/22 0435  AST 59*  --  150*  --  93* 49* 56*  ALT 24  --  73*  --  46* 32 27  ALKPHOS 125  --  315*  --  314* 248* 207*  BILITOT 0.4  --  0.2*  --   0.4 0.3 0.3  PROT 5.9*  --  5.9*  --  6.5 6.1* 5.9*  ALBUMIN 1.8*  --  1.7*  --  1.9* 1.7* 1.7*  LIPASE  --   --  119*  --   --  79* 74*  PLT  --  95* 126* 136*  --  159 191   AKI Initial creatinine was elevated to 5.5 due to sepsis, DKA, pancreatitis Creatinine gradually improved, normal at 1.24 today.  Continue to monitor Recent Labs    11/22/22 1633 11/23/22 0455 11/23/22 1510 11/24/22 0327 11/25/22 0526 11/26/22 0515 11/27/22 0250 11/28/22 0716 11/29/22 0336 12/01/22 0435  BUN 80* 63* 54* 36* 33* 31* 32* 24* 16 19  CREATININE 2.75* 2.03* 1.85* 1.28* 1.45* 1.24 1.36* 1.11 0.88 1.02   A-fib with RVR New onset A-fib in the setting of sepsis, hypovolemia PTA not on any AV node blocking agent. Currently in normal sinus rhythm.  CHADSVASC score elevated to 5 (HTN, HLD, DM2, stroke).   1/19, started on full dose Lovenox.  Held for surgery.  Will resume once okay with general surgery.  Essential hypertension PTA on amlodipine 10 mg daily, hydralazine 25 mg 3 times daily, clonidine as needed,  Currently on amlodipine 10 mg daily.  Acute encephalopathy Impaired mentation likely due to combination of sepsis, DKA, pancreatitis and in the background of history of stroke Initial CT head did not show any acute intracranial abnormality Continue to monitor mental status change  Recent right CVA with left-sided deficits and dysphagia HLD CT scan of head 1/13 showed a large remote right MCA distribution infarct with additional remote lacunar infarct at the left basal ganglia. Patient states, he was taking aspirin 81 mg daily and Lipitor 40 mg daily.  Since has been started on anticoagulation, no need to resume aspirin.  Lipitor on hold because of elevated liver enzymes Continue meclizine PRN for dizziness  Dysphagia While in ICU, patient was started on core track feeding.  Patient pulled it out on 1/20. Last seen by speech on 1/18.  On dysphagia diet.  Speech therapy to  follow-up.  Prolonged qtc Initial EKG with QTc prolonged to 509 ms probably because of RVR an abnormal electrolytes  Repeat EKG 1/19 showed QTc 426 ms. Continue to monitor  GERD Continue PPI  Mild acute anemia Hb >10. No active bleeding.  Postop hemoglobin slightly low today.  No active bleeding.  Continue to monitor.   Recent Labs    11/26/22 0515 11/27/22 0250 11/28/22 0442 11/29/22 0336 12/01/22 0435  HGB 11.4* 10.5* 9.3* 10.5* 9.8*  MCV 90.2 92.9 94.1 90.1 92.1   Major depressive disorder PTA on fluoxetine.    Impaired mobility Has baseline left-sided deficits after the stroke.  Current acute weakness related to acute comorbidities and prolonged hospitalization. PT eval recommended SNF TOC following   Goals of care   Code Status: Full Code     DVT prophylaxis: Full dose Lovenox to resume if okay with general surgery   Antimicrobials: IV Ancef completed on 1/23 Fluid: None Consultants: None Family Communication: None at bedside  Scheduled  Meds:  amLODipine  10 mg Per Tube Daily   Chlorhexidine Gluconate Cloth  6 each Topical Q0600   Chlorhexidine Gluconate Cloth  6 each Topical Q0600   insulin aspart  0-5 Units Subcutaneous QHS   insulin aspart  0-9 Units Subcutaneous TID WC   insulin aspart  10 Units Subcutaneous TID WC   insulin glargine-yfgn  40 Units Subcutaneous BID   mupirocin ointment  1 Application Nasal BID   pantoprazole (PROTONIX) IV  40 mg Intravenous Daily   sodium chloride flush  10-40 mL Intracatheter Q12H    PRN meds: sodium chloride, dextrose, docusate, hydrALAZINE, metoprolol tartrate, mouth rinse, oxyCODONE-acetaminophen, polyethylene glycol, sodium chloride flush, white petrolatum   Infusions:   sodium chloride 10 mL/hr at 11/30/22 1843    Antimicrobials: Anti-infectives (From admission, onward)    Start     Dose/Rate Route Frequency Ordered Stop   11/30/22 1041  ceFAZolin (ANCEF) 2-4 GM/100ML-% IVPB       Note to Pharmacy:  Humberto Leep O: cabinet override      11/30/22 1041 11/30/22 2259   11/24/22 2200  ceFAZolin (ANCEF) IVPB 2g/100 mL premix        2 g 200 mL/hr over 30 Minutes Intravenous Every 8 hours 11/24/22 1443 11/29/22 2210   11/23/22 2200  ceFAZolin (ANCEF) IVPB 2g/100 mL premix  Status:  Discontinued        2 g 200 mL/hr over 30 Minutes Intravenous Every 8 hours 11/23/22 0923 11/24/22 1443   11/21/22 2200  ceFEPIme (MAXIPIME) 2 g in sodium chloride 0.9 % 100 mL IVPB  Status:  Discontinued        2 g 200 mL/hr over 30 Minutes Intravenous Every 24 hours 11/20/22 2257 11/23/22 0923   11/21/22 0800  metroNIDAZOLE (FLAGYL) IVPB 500 mg  Status:  Discontinued        500 mg 100 mL/hr over 60 Minutes Intravenous Every 12 hours 11/21/22 0007 11/22/22 0821   11/20/22 2359  fluconazole (DIFLUCAN) IVPB 200 mg  Status:  Discontinued        200 mg 100 mL/hr over 60 Minutes Intravenous Daily at bedtime 11/20/22 2328 11/22/22 0821   11/20/22 2256  vancomycin variable dose per unstable renal function (pharmacist dosing)  Status:  Discontinued         Does not apply See admin instructions 11/20/22 2256 11/22/22 0821   11/20/22 2030  ceFEPIme (MAXIPIME) 2 g in sodium chloride 0.9 % 100 mL IVPB        2 g 200 mL/hr over 30 Minutes Intravenous  Once 11/20/22 2022 11/20/22 2329   11/20/22 2030  metroNIDAZOLE (FLAGYL) IVPB 500 mg        500 mg 100 mL/hr over 60 Minutes Intravenous  Once 11/20/22 2022 11/20/22 2329   11/20/22 2030  vancomycin (VANCOCIN) IVPB 1000 mg/200 mL premix  Status:  Discontinued        1,000 mg 200 mL/hr over 60 Minutes Intravenous  Once 11/20/22 2022 11/20/22 2024   11/20/22 2030  vancomycin (VANCOREADY) IVPB 2000 mg/400 mL        2,000 mg 200 mL/hr over 120 Minutes Intravenous  Once 11/20/22 2024 11/21/22 0044       Skin assessment:      Diet:  Diet Order             DIET - DYS 1 Room service appropriate? Yes with Assist; Fluid consistency: Nectar Thick  Diet effective now  Nutritional status:  Body mass index is 34.73 kg/m.  Nutrition Problem: Inadequate oral intake Etiology: inability to eat Signs/Symptoms: NPO status      Status is: Inpatient Level of care: Med-Surg  Continue in-hospital care because: Cholecystectomy POD 1.  Dispo: The patient is from: Home              Anticipated d/c is to: SNF              Patient currently is not medically stable to d/c.   Difficult to place patient No   Objective: Vitals:   12/01/22 0715 12/01/22 1500  BP: 125/72 (!) 178/79  Pulse: 65 66  Resp: 16 16  Temp: 99.4 F (37.4 C) 98.9 F (37.2 C)  SpO2: 99% 96%    Intake/Output Summary (Last 24 hours) at 12/01/2022 1523 Last data filed at 12/01/2022 1003 Gross per 24 hour  Intake 430 ml  Output 2051 ml  Net -1621 ml   Filed Weights   11/28/22 0500 11/29/22 0510 12/01/22 0504  Weight: 120.1 kg 122.8 kg 122.7 kg   Weight change:  Body mass index is 34.73 kg/m.   Physical Exam: General exam: Pleasant, elderly Caucasian male.  Propped up in bed.  Not in distress Skin: No rashes, lesions or ulcers. HEENT: Atraumatic, normocephalic, no obvious bleeding.  Lungs: Clear to auscultation bilaterally CVS: Regular rate and rhythm, no murmur GI/Abd soft, distended from obesity, right upper quadrant tenderness improved., bowel sound present CNS: Alert, awake, oriented to place and person.  Left-sided deficits at baseline Psychiatry: Mood appropriate Extremities: No pedal edema.  No tenderness  Data Review: I have personally reviewed the laboratory data and studies available.  F/u labs ordered Unresulted Labs (From admission, onward)    None       Total time spent in review of labs and imaging, patient evaluation, formulation of plan, documentation and communication with family: 49 minutes  Signed, Terrilee Croak, MD [facility] 12/01/2022

## 2022-12-01 NOTE — Progress Notes (Signed)
Speech Language Pathology Treatment: Dysphagia  Patient Details Name: [name] [name]. MRN: [id number] DOB: 08-22-1961 Today's Date: 12/01/2022 Time: 7741-4239 SLP Time Calculation (min) (ACUTE ONLY): 15 min  Assessment / Plan / Recommendation Clinical Impression  Patient seen by SLP for skilled treatment focused on dysphagia goals. Patient was awake, alert and said he felt a lot better since having surgery (laparoscopic cholecystectomy with IOC). He was receptive to trying some thin liquids (juice) but did not seem to mind nectar thick liquids, which he would sip on as well during this session. He told SLP, I know I have to go slow. He was not impulsive when drinking liquids via straw sip and SLP did not notice a significant difference between thin liquids and nectar thick liquids with swallow initiation appearing WFL and no overt s/s aspiration or penetration. SLP upgraded patient's liquids from nectar thick to thin and will follow for toleration and ability to upgrade solids.     HPI HPI: 62 yo male admitted 1/13 after found on the floor at home. Dx acute respiratory failure, severe sepsis, acute encephalopathy, acute pancreatitis.  Pt with DKA, hypothermic, hypotensive, AFib with RVR. PMhx: right CVA with lt hemiplegia, DM, vascular dementia, HTN, HLD. Remote hx of dysphagia after stroke in 2016.      SLP Plan  Continue with current plan of care      Recommendations for follow up therapy are one component of a multi-disciplinary discharge planning process, led by the attending physician.  Recommendations may be updated based on patient status, additional functional criteria and insurance authorization.    Recommendations  Supervision: Full supervision/cueing for compensatory strategies Compensations: Minimize environmental distractions;Slow rate;Small sips/bites Postural Changes and/or Swallow Maneuvers: Seated upright 90 degrees                Oral Care Recommendations: Oral  care BID Follow Up Recommendations: Skilled nursing-short term rehab (<3 hours/day) Assistance recommended at discharge: Frequent or constant Supervision/Assistance SLP Visit Diagnosis: Dysphagia, unspecified (R13.10) Plan: Continue with current plan of care           Sonia Baller, MA, CCC-SLP Speech Therapy

## 2022-12-01 NOTE — Progress Notes (Signed)
Physical Therapy Treatment Patient Details Name: [name] [name]. MRN: [id number] DOB: [birth year] Today's Date: 12/01/2022   History of Present Illness 62 yo male admitted 1/13 after found on the floor at home. Pt with DKA, hypothermic, hypotensive, AFib with RVR. PMhx: CVA with lt hemiplegia, DM, vascular dementia, HTN, HLD    PT Comments    Pt received in chair, chair alarm sounding as pt had scooted hips forward away from back of chair and had not remembered to use call bell. Pt agreeable to therapy session and with good participation, needing multimodal cues and +2 assist for safety given weakness, L hemiplegia and cognitive deficit. Pt needing up to modA +2 for support with dynamic standing tasks and minA for bed mobility. Pt continues to report dizziness however BP not orthostatic with sitting up supine postures, unable to obtain standing BP due to pt fatigue in stance. Pt unable to manage RW safely due to LUE flexor tone, consider L PFRW vs hemi-walker next session to work on standing balance progression. Pt continues to benefit from PT services to progress toward functional mobility goals.    Recommendations for follow up therapy are one component of a multi-disciplinary discharge planning process, led by the attending physician.  Recommendations may be updated based on patient status, additional functional criteria and insurance authorization.  Follow Up Recommendations  Skilled nursing-short term rehab (<3 hours/day) Can patient physically be transported by private vehicle: No   Assistance Recommended at Discharge Frequent or constant Supervision/Assistance  Patient can return home with the following Two people to help with walking and/or transfers;A lot of help with bathing/dressing/bathroom;Assistance with cooking/housework;Direct supervision/assist for medications management;Direct supervision/assist for financial management;Assist for transportation;Help with stairs or ramp for  entrance   Equipment Recommendations  None recommended by PT    Recommendations for Other Services       Precautions / Restrictions Precautions Precautions: Fall;Other (comment) Precaution Comments: L UE hemiparesis Restrictions Weight Bearing Restrictions: No     Mobility  Bed Mobility Overal bed mobility: Needs Assistance Bed Mobility: Sit to Supine       Sit to supine: Min assist, +2 for physical assistance   General bed mobility comments: min guard to minA for trunk guidance and BLE repositioning once in supine, pt using momentum to assist with trunk/LE placement and somewhat unsafe.    Transfers Overall transfer level: Needs assistance Equipment used: Rolling walker (2 wheels) Transfers: Sit to/from Stand, Bed to chair/wheelchair/BSC Sit to Stand: Mod assist, +2 physical assistance   Step pivot transfers: Mod assist, +2 safety/equipment, +2 physical assistance       General transfer comment: pt with difficulty managing RW upon standing so utilized +2 HHA for safety while pivoting from chair>bed. RN present to provide hygiene assist while PTA x2 providing him with HHA standing from chair initially. +2 modA via HHA/gait belt for sidesteps along EOB and standing hip flexion    Ambulation/Gait             Pre-gait activities: standing hip flexion x5 reps ea LE with +2 HHA General Gait Details: defer, fatigued/dizzy   Stairs             Wheelchair Mobility    Modified Rankin (Stroke Patients Only)       Balance Overall balance assessment: Needs assistance Sitting-balance support: Feet supported, Single extremity supported Sitting balance-Leahy Scale: Fair Sitting balance - Comments: seated EOB   Standing balance support: Single extremity supported, Bilateral upper extremity supported Standing balance-Leahy Scale: Poor  Standing balance comment: +2 modA for standing weight shifting, +1 modA for static standing at bedside                             Cognition Arousal/Alertness: Awake/alert Behavior During Therapy: WFL for tasks assessed/performed Overall Cognitive Status: Difficult to assess                         Following Commands: Follows one step commands with increased time Safety/Judgement: Decreased awareness of safety, Decreased awareness of deficits Awareness: Intellectual Problem Solving: Slow processing, Difficulty sequencing, Requires verbal cues General Comments: Pt at times states "yeah" when he means "no" and needs multiple questions to clarify. Pt pleasantly cooperative, doesn't report his symptoms of dizziness until PTA prompts him to ask. Pt benefits from multimodal cues and with decreased safety awareness, he had slid hips forward in chair prior to PTA arrival far enough that chair alarm was sounding.        Exercises General Exercises - Lower Extremity Hip Flexion/Marching: AROM, Both, 5 reps, Standing Other Exercises Other Exercises: STS x 3 reps for strengthening (from chair and EOB, pt c/o dizziness/fatigue and defers more).    General Comments General comments (skin integrity, edema, etc.): HR/SpO2 [facility] checked seated then in supine. BP elevated, RN notified. BP seated 208/110 in RLE (calf), likely inaccurate as pt c/o pain and moving his R leg. Then checked seated LLE BP 165/114 (112) HR 71 bpm. Once pt back in supine, LLE BP reading 178/79 (106) HR 67 bpm.      Pertinent Vitals/Pain Pain Assessment Pain Assessment: PAINAD Breathing: normal Negative Vocalization: occasional moan/groan, low speech, negative/disapproving quality Facial Expression: facial grimacing Body Language: relaxed Consolability: distracted or reassured by voice/touch PAINAD Score: 4 Pain Location: R calf with BP assessment; otherwise no s/sx pain Pain Descriptors / Indicators: Grimacing, Guarding, Discomfort, Sharp Pain Intervention(s): Limited activity within patient's tolerance, Monitored during  session, Repositioned, Other (comment) (RN notified via secure chat of RLE pain with BP assessment)    Home Living Family/patient expects to be discharged to:: Skilled nursing facility Living Arrangements: Alone                          PT Goals (current goals can now be found in the care plan section) Acute Rehab PT Goals Patient Stated Goal: to get better PT Goal Formulation: With patient Time For Goal Achievement: 12/08/22 Progress towards PT goals: Progressing toward goals    Frequency    Min 3X/week      PT Plan Current plan remains appropriate       AM-PAC PT "6 Clicks" Mobility   Outcome Measure  Help needed turning from your back to your side while in a flat bed without using bedrails?: A Little Help needed moving from lying on your back to sitting on the side of a flat bed without using bedrails?: A Lot Help needed moving to and from a bed to a chair (including a wheelchair)?: A Lot Help needed standing up from a chair using your arms (e.g., wheelchair or bedside chair)?: A Lot Help needed to walk in hospital room?: Total Help needed climbing 3-5 steps with a railing? : Total 6 Click Score: 11    End of Session Equipment Utilized During Treatment: Gait belt Activity Tolerance: Patient tolerated treatment well;Patient limited by fatigue Patient left: in bed;with call bell/phone within reach;with  bed alarm set Nurse Communication: Mobility status;Precautions;Other (comment) (elevated BP, NT/RN notified, entered into orthostatics flowsheet as well; +2 for safety with pivoting OOB) PT Visit Diagnosis: Unsteadiness on feet (R26.81);Muscle weakness (generalized) (M62.81)     Time: 1441-1500 PT Time Calculation (min) (ACUTE ONLY): 19 min  Charges:  $Therapeutic Activity: 8-22 mins                     Ziyad Dyar P., PTA Acute Rehabilitation Services Secure Chat Preferred 9a-5:30pm Office: [place] 12/01/2022, 5:59 PM

## 2022-12-02 ENCOUNTER — Other Ambulatory Visit (HOSPITAL_COMMUNITY): Payer: Self-pay

## 2022-12-02 DIAGNOSIS — K8000 Calculus of gallbladder with acute cholecystitis without obstruction: Secondary | ICD-10-CM | POA: Diagnosis not present

## 2022-12-02 LAB — COMPREHENSIVE METABOLIC PANEL
ALT: 30 U/L (ref 0–44)
AST: 40 U/L (ref 15–41)
Albumin: 1.9 g/dL — ABNORMAL LOW (ref 3.5–5.0)
Alkaline Phosphatase: 208 U/L — ABNORMAL HIGH (ref 38–126)
Anion gap: 6 (ref 5–15)
BUN: 14 mg/dL (ref 8–23)
CO2: 25 mmol/L (ref 22–32)
Calcium: 8.5 mg/dL — ABNORMAL LOW (ref 8.9–10.3)
Chloride: 109 mmol/L (ref 98–111)
Creatinine, Ser: 0.84 mg/dL (ref 0.61–1.24)
GFR, Estimated: >60 mL/min (ref >60)
Glucose, Bld: 219 mg/dL — ABNORMAL HIGH (ref 70–99)
Potassium: 3.6 mmol/L (ref 3.5–5.1)
Sodium: 140 mmol/L (ref 135–145)
Total Bilirubin: 0.4 mg/dL (ref 0.3–1.2)
Total Protein: 6.2 g/dL — ABNORMAL LOW (ref 6.5–8.1)

## 2022-12-02 LAB — GLUCOSE, CAPILLARY
Glucose-Capillary: 255 mg/dL — ABNORMAL HIGH (ref 70–99)
Glucose-Capillary: 174 mg/dL — ABNORMAL HIGH (ref 70–99)
Glucose-Capillary: 148 mg/dL — ABNORMAL HIGH (ref 70–99)
Glucose-Capillary: 186 mg/dL — ABNORMAL HIGH (ref 70–99)
Glucose-Capillary: 241 mg/dL — ABNORMAL HIGH (ref 70–99)
Glucose-Capillary: 129 mg/dL — ABNORMAL HIGH (ref 70–99)

## 2022-12-02 LAB — CBC
HCT: 32.2 % — ABNORMAL LOW (ref 39.0–52.0)
Hemoglobin: 10.5 g/dL — ABNORMAL LOW (ref 13.0–17.0)
MCH: 29.2 pg (ref 26.0–34.0)
MCHC: 32.6 g/dL (ref 30.0–36.0)
MCV: 89.7 fL (ref 80.0–100.0)
Platelets: 225 10*3/uL (ref 150–400)
RBC: 3.59 MIL/uL — ABNORMAL LOW (ref 4.22–5.81)
RDW: 14.9 % (ref 11.5–15.5)
WBC: 6.4 10*3/uL (ref 4.0–10.5)
nRBC: 0 % (ref 0.0–0.2)

## 2022-12-02 MED ORDER — ENOXAPARIN SODIUM 40 MG/0.4ML IJ SOSY: 40.0000 mg | PREFILLED_SYRINGE | Freq: Two times a day (BID) | INTRAMUSCULAR | Status: DC

## 2022-12-02 MED ORDER — FAMOTIDINE 20 MG PO TABS
20.0000 mg | ORAL_TABLET | Freq: Two times a day (BID) | ORAL | Status: DC
  Administered 2022-12-02 – 2022-12-06 (×9): 20 mg via ORAL
  Filled 2022-12-02 (×9): qty 1

## 2022-12-02 MED ORDER — AMLODIPINE BESYLATE 10 MG PO TABS
10.0000 mg | ORAL_TABLET | Freq: Every day | ORAL | Status: DC
  Administered 2022-12-02 – 2022-12-06 (×5): 10 mg via ORAL
  Filled 2022-12-02 (×4): qty 1

## 2022-12-02 MED ORDER — DOCUSATE SODIUM 100 MG PO CAPS: 100.0000 mg | ORAL_CAPSULE | Freq: Two times a day (BID) | ORAL | Status: DC

## 2022-12-02 MED ORDER — APIXABAN 5 MG PO TABS
5.0000 mg | ORAL_TABLET | Freq: Two times a day (BID) | ORAL | Status: DC
  Administered 2022-12-02 – 2022-12-06 (×9): 5 mg via ORAL
  Filled 2022-12-02 (×9): qty 1

## 2022-12-02 MED ORDER — POLYETHYLENE GLYCOL 3350 17 G PO PACK: 17.0000 g | PACK | Freq: Every day | ORAL | Status: DC | PRN

## 2022-12-02 MED ORDER — PANTOPRAZOLE SODIUM 40 MG PO TBEC: 40.0000 mg | DELAYED_RELEASE_TABLET | Freq: Every day | ORAL | Status: DC

## 2022-12-02 MED ORDER — INSULIN GLARGINE-YFGN 100 UNIT/ML ~~LOC~~ SOLN
50.0000 [IU] | Freq: Two times a day (BID) | SUBCUTANEOUS | Status: DC
  Administered 2022-12-02 – 2022-12-04 (×6): 50 [IU] via SUBCUTANEOUS
  Filled 2022-12-02 (×8): qty 0.5

## 2022-12-02 MED ORDER — ADULT MULTIVITAMIN W/MINERALS CH
1.0000 | ORAL_TABLET | Freq: Every day | ORAL | Status: DC
  Administered 2022-12-02 – 2022-12-06 (×5): 1 via ORAL
  Filled 2022-12-02 (×5): qty 1

## 2022-12-02 MED ORDER — DOCUSATE SODIUM 50 MG/5ML PO LIQD
100.0000 mg | Freq: Two times a day (BID) | ORAL | Status: DC
  Administered 2022-12-02 – 2022-12-06 (×7): 100 mg via ORAL
  Filled 2022-12-02 (×8): qty 10

## 2022-12-02 NOTE — Progress Notes (Addendum)
[facility] Progress Note  2 Days Post-Op  Subjective: CC-  Comfortable this morning. Denies any abdominal pain. Only took percocet once yesterday. Tolerating diet without n/v. BM yesterday. Therapies recommending SNF  Objective: Vital signs in last 24 hours: Temp:  [98.4 F (36.9 C)-98.9 F (37.2 C)] 98.4 F (36.9 C) (01/25 0756) Pulse Rate:  [62-69] 69 (01/25 0756) Resp:  [16-18] 17 (01/25 0756) BP: (155-189)/(79-98) 159/88 (01/25 0756) SpO2:  [96 %-100 %] 96 % (01/25 0756) Weight:  [118.4 kg] 118.4 kg (01/25 0532) Last BM Date : 11/30/22  Intake/Output from previous day: 01/24 0701 - 01/25 0700 In: 180 [P.O.:180] Out: 927 [Urine:925; Stool:2] Intake/Output this shift: No intake/output data recorded.  PE: Gen:  Alert, NAD, pleasant Abd: Soft, protuberant, nontender, incisions c/d/i  Lab Results:  Recent Labs    12/01/22 0435  WBC 8.8  HGB 9.8*  HCT 31.3*  PLT 191   BMET Recent Labs    12/01/22 0435  NA 139  K 4.4  CL 105  CO2 26  GLUCOSE 316*  BUN 19  CREATININE 1.02  CALCIUM 7.9*   PT/INR No results for input(s): "LABPROT", "INR" in the last 72 hours. CMP     Component Value Date/Time   NA 139 12/01/2022 0435   NA 142 05/07/2016 1134   K 4.4 12/01/2022 0435   CL 105 12/01/2022 0435   CO2 26 12/01/2022 0435   GLUCOSE 316 (H) 12/01/2022 0435   BUN 19 12/01/2022 0435   BUN 13 05/07/2016 1134   CREATININE 1.02 12/01/2022 0435   CREATININE 1.06 06/21/2022 0901   CALCIUM 7.9 (L) 12/01/2022 0435   PROT 5.9 (L) 12/01/2022 0435   PROT 7.3 01/28/2016 0946   ALBUMIN 1.7 (L) 12/01/2022 0435   ALBUMIN 4.5 01/28/2016 0946   AST 56 (H) 12/01/2022 0435   ALT 27 12/01/2022 0435   ALKPHOS 207 (H) 12/01/2022 0435   BILITOT 0.3 12/01/2022 0435   BILITOT 0.2 01/28/2016 0946   GFRNONAA >60 12/01/2022 0435   GFRNONAA 57 (L) 05/22/2020 1031   GFRAA 66 05/22/2020 1031   Lipase     Component Value Date/Time   LIPASE 74 (H) 12/01/2022 0435        Studies/Results: DG Cholangiogram Operative  Result Date: 11/30/2022 CLINICAL DATA:  62 year old male with cholelithiasis EXAM: INTRAOPERATIVE CHOLANGIOGRAM TECHNIQUE: Cholangiographic images from the C-arm fluoroscopic device were submitted for interpretation post-operatively. Please see the procedural report for the amount of contrast and the fluoroscopy time utilized. FLUOROSCOPY: Radiation Exposure Index (as provided by the fluoroscopic device): 33 mGy Kerma COMPARISON:  Ultrasound 11/26/2022 FINDINGS: Surgical instruments project over the upper abdomen. Some extra anatomic contrast present on the study. There is cannulation of the cystic duct/gallbladder neck, with antegrade infusion of contrast. Caliber of the extrahepatic ductal system within normal limits. No definite filling defect within the extrahepatic ducts identified. Free flow of contrast across the ampulla. IMPRESSION: Intraoperative cholangiogram demonstrates extrahepatic biliary ducts of unremarkable caliber, with no definite filling defects identified. Free flow of contrast across the ampulla. Please refer to the dictated operative report for full details of intraoperative findings and procedure Electronically Signed   By: Corrie Mckusick D.O.   On: 11/30/2022 13:34    Anti-infectives: Anti-infectives (From admission, onward)    Start     Dose/Rate Route Frequency Ordered Stop   11/30/22 1041  ceFAZolin (ANCEF) 2-4 GM/100ML-% IVPB       Note to Pharmacy: Humberto Leep O: cabinet override  11/30/22 1041 11/30/22 2259   11/24/22 2200  ceFAZolin (ANCEF) IVPB 2g/100 mL premix        2 g 200 mL/hr over 30 Minutes Intravenous Every 8 hours 11/24/22 1443 11/29/22 2210   11/23/22 2200  ceFAZolin (ANCEF) IVPB 2g/100 mL premix  Status:  Discontinued        2 g 200 mL/hr over 30 Minutes Intravenous Every 8 hours 11/23/22 0923 11/24/22 1443   11/21/22 2200  ceFEPIme (MAXIPIME) 2 g in sodium chloride 0.9 % 100 mL IVPB  Status:   Discontinued        2 g 200 mL/hr over 30 Minutes Intravenous Every 24 hours 11/20/22 2257 11/23/22 0923   11/21/22 0800  metroNIDAZOLE (FLAGYL) IVPB 500 mg  Status:  Discontinued        500 mg 100 mL/hr over 60 Minutes Intravenous Every 12 hours 11/21/22 0007 11/22/22 0821   11/20/22 2359  fluconazole (DIFLUCAN) IVPB 200 mg  Status:  Discontinued        200 mg 100 mL/hr over 60 Minutes Intravenous Daily at bedtime 11/20/22 2328 11/22/22 0821   11/20/22 2256  vancomycin variable dose per unstable renal function (pharmacist dosing)  Status:  Discontinued         Does not apply See admin instructions 11/20/22 2256 11/22/22 0821   11/20/22 2030  ceFEPIme (MAXIPIME) 2 g in sodium chloride 0.9 % 100 mL IVPB        2 g 200 mL/hr over 30 Minutes Intravenous  Once 11/20/22 2022 11/20/22 2329   11/20/22 2030  metroNIDAZOLE (FLAGYL) IVPB 500 mg        500 mg 100 mL/hr over 60 Minutes Intravenous  Once 11/20/22 2022 11/20/22 2329   11/20/22 2030  vancomycin (VANCOCIN) IVPB 1000 mg/200 mL premix  Status:  Discontinued        1,000 mg 200 mL/hr over 60 Minutes Intravenous  Once 11/20/22 2022 11/20/22 2024   11/20/22 2030  vancomycin (VANCOREADY) IVPB 2000 mg/400 mL        2,000 mg 200 mL/hr over 120 Minutes Intravenous  Once 11/20/22 2024 11/21/22 0044        Assessment/Plan Gallstone pancreatitis POD#2 S/p laparoscopic cholecystectomy with IOC 1/23 Dr. Brantley Stage - IOC was negative for choledocholithiasis  - labs pending this morning. VSS - Tolerating diet (DYS 1, advance per primary/ SLP) and having bowel function. Pain controlled on oral medications. Stable for discharge from surgical standpoint. Discharge instructions and follow up info on AVS General surgery will sign off, please call with questions or concerns.    ID: no further abx needed from general surgery standpoint VTE: SCD's, ok for chemical VTE ppx or full dose anticoagulation  Foley: none   LOS: 11 days    Wellington Hampshire,  [facility] 12/02/2022, 8:42 AM Please see Amion for pager number during day hours 7:00am-4:30pm

## 2022-12-02 NOTE — Discharge Instructions (Addendum)
[facility]  Please arrive at least 30 min before your appointment to complete your check in paperwork.  If you are unable to arrive 30 min prior to your appointment time we may have to cancel or reschedule you. LAPAROSCOPIC SURGERY: POST OP INSTRUCTIONS Always review your discharge instruction sheet given to you by the facility where your surgery was performed. IF YOU HAVE DISABILITY OR FAMILY LEAVE FORMS, YOU MUST BRING THEM TO THE OFFICE FOR PROCESSING.   DO NOT GIVE THEM TO YOUR DOCTOR.  PAIN CONTROL  First take acetaminophen (Tylenol) AND/or ibuprofen (Advil) to control your pain after surgery.  Follow directions on package.  Taking acetaminophen (Tylenol) and/or ibuprofen (Advil) regularly after surgery will help to control your pain and lower the amount of prescription pain medication you may need.  You should not take more than 4,000 mg (4 grams) of acetaminophen (Tylenol) in 24 hours.  You should not take ibuprofen (Advil), aleve, motrin, naprosyn or other NSAIDS if you have a history of stomach ulcers or chronic kidney disease.  A prescription for pain medication may be given to you upon discharge.  Take your pain medication as prescribed, if you still have uncontrolled pain after taking acetaminophen (Tylenol) or ibuprofen (Advil). Use ice packs to help control pain. If you need a refill on your pain medication, please contact your pharmacy.  They will contact our office to request authorization. Prescriptions will not be filled after 5pm or on week-ends.  HOME MEDICATIONS Take your usually prescribed medications unless otherwise directed.  DIET You should follow a light diet the first few days after arrival home.  Be sure to include lots of fluids daily. Avoid fatty, fried foods.   CONSTIPATION It is common to experience some constipation after surgery and if you are taking pain medication.  Increasing fluid intake and taking a stool softener (such as Colace)  will usually help or prevent this problem from occurring.  A mild laxative (Milk of Magnesia or Miralax) should be taken according to package instructions if there are no bowel movements after 48 hours.  WOUND/INCISION CARE Most patients will experience some swelling and bruising in the area of the incisions.  Ice packs will help.  Swelling and bruising can take several days to resolve.  Unless discharge instructions indicate otherwise, follow guidelines below  STERI-STRIPS - you may remove your outer bandages 48 hours after surgery, and you may shower at that time.  You have steri-strips (small skin tapes) in place directly over the incision.  These strips should be left on the skin for 7-10 days.   DERMABOND/SKIN GLUE - you may shower in 24 hours.  The glue will flake off over the next 2-3 weeks. Any sutures or staples will be removed at the office during your follow-up visit.  ACTIVITIES You may resume regular (light) daily activities beginning the next day--such as daily self-care, walking, climbing stairs--gradually increasing activities as tolerated.  You may have sexual intercourse when it is comfortable.  Refrain from any heavy lifting or straining until approved by your doctor. You may drive when you are no longer taking prescription pain medication, you can comfortably wear a seatbelt, and you can safely maneuver your car and apply brakes.  FOLLOW-UP You should see your doctor in the office for a follow-up appointment approximately 2-3 weeks after your surgery.  You should have been given your post-op/follow-up appointment when your surgery was scheduled.  If you did not receive a post-op/follow-up appointment, make sure   that you call for this appointment within a day or two after you arrive home to insure a convenient appointment time.  OTHER INSTRUCTIONS  WHEN TO CALL YOUR DOCTOR: Fever over 101.0 Inability to urinate Continued bleeding from incision. Increased pain, redness, or  drainage from the incision. Increasing abdominal pain  The clinic staff is available to answer your questions during regular business hours.  Please don't hesitate to call and ask to speak to one of the nurses for clinical concerns.  If you have a medical emergency, go to the nearest emergency room or call 911.  A surgeon from [facility] is always on call at the hospital. [address] [place], [place], [place]  [address] ? [address], [place], [place]   [address] [phone] ? [phone] ? FAX [phone]  ===============================================================================  Information on my medicine - ELIQUIS (apixaban)  This medication education was reviewed with me or my healthcare representative as part of my discharge preparation.     Why was Eliquis prescribed for you? Eliquis was prescribed for you to reduce the risk of a blood clot forming that can cause a stroke if you have a medical condition called atrial fibrillation (a type of irregular heartbeat).  What do You need to know about Eliquis ? Take your Eliquis TWICE DAILY - one tablet in the morning and one tablet in the evening with or without food. If you have difficulty swallowing the tablet whole please discuss with your pharmacist how to take the medication safely.  Take Eliquis exactly as prescribed by your doctor and DO NOT stop taking Eliquis without talking to the doctor who prescribed the medication.  Stopping may increase your risk of developing a stroke.  Refill your prescription before you run out.  After discharge, you should have regular check-up appointments with your healthcare provider that is prescribing your Eliquis.  In the future your dose may need to be changed if your kidney function or weight changes by a significant amount or as you get older.  What do you do if you miss a dose? If you miss a dose, take it as soon as you remember on the same day and resume  taking twice daily.  Do not take more than one dose of ELIQUIS at the same time to make up a missed dose.  Important Safety Information A possible side effect of Eliquis is bleeding. You should call your healthcare provider right away if you experience any of the following: Bleeding from an injury or your nose that does not stop. Unusual colored urine (red or dark brown) or unusual colored stools (red or black). Unusual bruising for unknown reasons. A serious fall or if you hit your head (even if there is no bleeding).  Some medicines may interact with Eliquis and might increase your risk of bleeding or clotting while on Eliquis. To help avoid this, consult your healthcare provider or pharmacist prior to using any new prescription or non-prescription medications, including herbals, vitamins, non-steroidal anti-inflammatory drugs (NSAIDs) and supplements.  This website has more information on Eliquis (apixaban): http://www.eliquis.com/eliquis/home  ================================================================================  Atrial Fibrillation    Atrial fibrillation is a type of heartbeat that is irregular or fast. If you have this condition, your heart beats without any order. This makes it hard for your heart to pump blood in a normal way. Atrial fibrillation may come and go, or it may become a long-lasting problem. If this condition is not treated, it can put you at higher  risk for stroke, heart failure, and other heart problems.  What are the causes? This condition may be caused by diseases that damage the heart. They include: High blood pressure. Heart failure. Heart valve disease. Heart surgery. Other causes include: Diabetes. Thyroid disease. Being overweight. Kidney disease. Sometimes the cause is not known.  What increases the risk? You are more likely to develop this condition if: You are older. You smoke. You exercise often and very hard. You have a family  history of this condition. You are a man. You use drugs. You drink a lot of alcohol. You have lung conditions, such as emphysema, pneumonia, or COPD. You have sleep apnea.  What are the signs or symptoms? Common symptoms of this condition include: A feeling that your heart is beating very fast. Chest pain or discomfort. Feeling short of breath. Suddenly feeling light-headed or weak. Getting tired easily during activity. Fainting. Sweating. In some cases, there are no symptoms.  How is this treated? Treatment for this condition depends on underlying conditions and how you feel when you have atrial fibrillation. They include: Medicines to: Prevent blood clots. Treat heart rate or heart rhythm problems. Using devices, such as a pacemaker, to correct heart rhythm problems. Doing surgery to remove the part of the heart that sends bad signals. Closing an area where clots can form in the heart (left atrial appendage). In some cases, your doctor will treat other underlying conditions.  Follow these instructions at home:  Medicines Take over-the-counter and prescription medicines only as told by your doctor. Do not take any new medicines without first talking to your doctor. If you are taking blood thinners: Talk with your doctor before you take any medicines that have aspirin or NSAIDs, such as ibuprofen, in them. Take your medicine exactly as told by your doctor. Take it at the same time each day. Avoid activities that could hurt or bruise you. Follow instructions about how to prevent falls. Wear a bracelet that says you are taking blood thinners. Or, carry a card that lists what medicines you take. Lifestyle         Do not use any products that have nicotine or tobacco in them. These include cigarettes, e-cigarettes, and chewing tobacco. If you need help quitting, ask your doctor. Eat heart-healthy foods. Talk with your doctor about the right eating plan for you. Exercise  regularly as told by your doctor. Do not drink alcohol. Lose weight if you are overweight. Do not use drugs, including cannabis.  General instructions If you have a condition that causes breathing to stop for a short period of time (apnea), treat it as told by your doctor. Keep a healthy weight. Do not use diet pills unless your doctor says they are safe for you. Diet pills may make heart problems worse. Keep all follow-up visits as told by your doctor. This is important.  Contact a doctor if: You notice a change in the speed, rhythm, or strength of your heartbeat. You are taking a blood-thinning medicine and you get more bruising. You get tired more easily when you move or exercise. You have a sudden change in weight.  Get help right away if:    You have pain in your chest or your belly (abdomen). You have trouble breathing. You have side effects of blood thinners, such as blood in your vomit, poop (stool), or pee (urine), or bleeding that cannot stop. You have any signs of a stroke. "BE FAST" is an easy way to remember the  main warning signs: B - Balance. Signs are dizziness, sudden trouble walking, or loss of balance. E - Eyes. Signs are trouble seeing or a change in how you see. F - Face. Signs are sudden weakness or loss of feeling in the face, or the face or eyelid drooping on one side. A - Arms. Signs are weakness or loss of feeling in an arm. This happens suddenly and usually on one side of the body. S - Speech. Signs are sudden trouble speaking, slurred speech, or trouble understanding what people say. T - Time. Time to call emergency services. Write down what time symptoms started. You have other signs of a stroke, such as: A sudden, very bad headache with no known cause. Feeling like you may vomit (nausea). Vomiting. A seizure.  These symptoms may be an emergency. Do not wait to see if the symptoms will go away. Get medical help right away. Call your local emergency  services (911 in the [place] Do not drive yourself to the hospital. Summary Atrial fibrillation is a type of heartbeat that is irregular or fast. You are at higher risk of this condition if you smoke, are older, have diabetes, or are overweight. Follow your doctor's instructions about medicines, diet, exercise, and follow-up visits. Get help right away if you have signs or symptoms of a stroke. Get help right away if you cannot catch your breath, or you have chest pain or discomfort. This information is not intended to replace advice given to you by your health care provider. Make sure you discuss any questions you have with your health care provider. Document Revised: 04/18/2019 Document Reviewed: 04/18/2019 Elsevier Patient Education  [place].

## 2022-12-02 NOTE — TOC Benefit Eligibility Note (Signed)
Patient Teacher, English as a foreign language completed.    The patient is currently admitted and upon discharge could be taking Eliquis 5 mg.  The current 30 day co-pay is $42.00.   The patient is currently admitted and upon discharge could be taking Lantus Pen.  The current 30 day co-pay is $35.00.   The patient is currently admitted and upon discharge could be taking Humalog Pen.  The current 30 day co-pay is $35.00.   The patient is insured through [place] [place] Patient Advocate Specialist [place] Patient Advocate Team Direct Number: [phone]  Fax: [phone]

## 2022-12-02 NOTE — TOC Progression Note (Addendum)
Transition of Care (TOC) - Progression Note    Patient Details  Name: [name] [name]. MRN: [id number] Date of Birth: June 27, 1961  Transition of Care [facility]) CM/SW [place], [place] Phone Number: 12/02/2022, 11:43 AM  Clinical Narrative:    CSW followed up with [place] and was advised that authorization was started, but is still pending at this time. CSW spoke with [name], who is still agreeable to DC to [place] when ready. She was advised that the insurance was the barrier, and would DC afterwards. Family agreeable to [facility] transport. Medical team updated to barriers. TOC will continue to follow.   3:30 CSW followed up with facility to get an update on authorization. VM left, will continue to follow.   Expected Discharge Plan: Skilled Nursing Facility Barriers to Discharge: Ship broker, Continued Medical Work up  Expected Discharge Plan and Services                                               Social Determinants of Health (SDOH) Interventions SDOH Screenings   Food Insecurity: No Food Insecurity (01/11/2018)  Transportation Needs: No Transportation Needs (01/11/2018)  Depression (PHQ2-9): Low Risk  (06/17/2022)  Financial Resource Strain: Low Risk  (01/11/2018)  Physical Activity: Inactive (01/11/2018)  Social Connections: Moderately Isolated (01/11/2018)  Stress: No Stress Concern Present (01/11/2018)  Tobacco Use: Medium Risk (12/01/2022)    Readmission Risk Interventions     No data to display

## 2022-12-02 NOTE — Progress Notes (Signed)
Nutrition Follow-up  DOCUMENTATION CODES:  Not applicable  INTERVENTION:  Encourage good PO intake Multivitamin w/ minerals daily  NUTRITION DIAGNOSIS:  Inadequate oral intake related to inability to eat as evidenced by NPO status. - Progressing, pt now on diet  GOAL:  Patient will meet greater than or equal to 90% of their needs - progressing, being met with TF  MONITOR:  Diet advancement, Labs, TF tolerance, I & O's  REASON FOR ASSESSMENT:  Other (Comment) (Cortrak List)    ASSESSMENT:  62 y.o. male presented to the ED with AMS. PMH includes T2DM, HTN, HLD, CVA with L side deficits, and dysphagia. Pt admitted with DKA, acute encephalopathy, sepsis 2/2 possible UTI, hypernatremia, and AKI.   1/14 - Admitted 1/15 - Cortrak placed (tip stomach) 1/17 - SLP evaluation, NPO 1/18 - transferred to floor; diet advanced to Dysphagia 1, Nectar thick liquids 1/21 - liquids upgraded to thin 1/23 - Cortrak removed; OR, lap cholecystectomy   Pt laying in bed. Reports that he has been eating well. Denies any nausea or vomiting.  RD discussed with RN, RN voices no concerns at this time.   Meal Intake 1/22-1/25: 85-100% x 4 meals (average 91%)  Medications reviewed and include: Colace, Pepcid, NovoLog SSI + 10 units - TID, Semglee Labs reviewed: 24 hr CBGs 148-255  Diet Order:   Diet Order             DIET - DYS 1 Room service appropriate? Yes with Assist; Fluid consistency: Thin  Diet effective now                   EDUCATION NEEDS:  Not appropriate for education at this time  Skin:  Skin Assessment: Reviewed RN Assessment  Last BM:  1/24  Height:  Ht Readings from Last 1 Encounters:  11/04/22 '6\' 2"'$  (1.88 m)   Weight:  Wt Readings from Last 1 Encounters:  12/02/22 118.4 kg   Ideal Body Weight:  86.4 kg  BMI:  Body mass index is 33.51 kg/m.  Estimated Nutritional Needs:  Kcal:  2200-2400 Protein:  110-125 grams Fluid:  >/= 2 L   Hermina Barters RD,  LDN Clinical Dietitian See [facility] for contact information.

## 2022-12-02 NOTE — Progress Notes (Addendum)
PROGRESS NOTE  [name] [name].  DOB: Oct 16, 1961  PCP: Sandrea Hughs, NP IEP:[id number]  DOA: 11/20/2022  LOS: 11 days  Hospital Day: 13  Brief narrative: [name] Dietrick. is a 63 y.o. male with PMH significant for DM2, HTN, HLD, previous right CVA with left-sided deficits, dysphagia, falls who lives at home and uses walker at baseline. 1/13, patient was brought to the ED from home after he was found on the floor.  Last seen normal was 2 days prior.  Per family, patient had been lethargic for about a week.  They believe it was after he started having diarrhea due to laxative that he uses for chronic constipation.  1/9, patient had a fall at home. Per EMS, at the scene, blood pressure was low at 90/56  In the ED, patient was in A-fib with RVR at 140s, initial blood pressure was low at 78/54 Labs with glucose level 1100, anion gap 29, pH low at 7.3, serum bicarb 14, beta-hydroxybutyrate level elevated to more than 8.  WBC count 16, lactic acid 3.2, sodium level elevated to 153, potassium elevated to 6.9, creatinine elevated 5.5 Patient was started on IV fluid, IV insulin drip per DKA protocol Chest x-ray unremarkable Admitted to ICU Blood culture sent on admission showed multi species growth including Proteus Mirabella's, Staph epidermidis and lactobacillus species. Urine culture showed 50,000 CFU per mL of E. coli 1/19, transferred out to [facility]  Subjective: Patient was seen and examined this morning.  Propped up in bed.  Not in distress.  No new symptoms.  Able to tolerate food.  Blood sugar level gradually improving.  Assessment and plan: Severe sepsis - POA Multi species bacteremia and UTI On admission, patient elevated WBC count, elevated lactic acid and low blood pressure.  Met criteria for severe sepsis.   Blood culture sent on admission grew multiple species including Proteus mirabilis, Staph epidermidis.  Unclear significance.  Urine culture grew 2000 CFU per mL of E. coli,  unclear significance.   Repeat blood culture sent on 1/14 did not show any growth. In any case, patient is clinically improving on IV antibiotics.   Currently on IV Ancef.  Completed 10-day course on 1/23. Recent Labs  Lab 11/27/22 0250 11/28/22 0442 11/29/22 0336 12/01/22 0435 12/02/22 0833  WBC 7.1 7.1 8.6 8.8 6.4   DKA Initial labs as above showed DKA Improved with IV insulin drip, IV fluid and electrolyte management. Subsequently switched to subcu insulin.  Type 2 diabetes mellitus uncontrolled  hypOglycemia A1c uncontrolled at 13.5 on 11/21/2022 PTA not on meds. Patient was requiring a lot of insulin while getting tube feeding.  Currently on dysphagia diet only.  Insulin dose being adjusted based on blood sugar trend. Increase Semglee to 50 units twice daily.  Continue Premeal insulin continues daily and sliding sequentially.   Diabetes care coordinator consult appreciated. Recent Labs  Lab 12/01/22 2015 12/02/22 0059 12/02/22 0528 12/02/22 0757 12/02/22 1203  GLUCAP 233* 255* 174* 148* 186*   Acute cholecystitis and cholelithiasis Acute biliary pancreatitis Initial lipase level elevated to more than 1000.   1/14, CT abdomen showed acute interstitial pancreatitis with minimal nonlocalizing peripancreatic fluid, without abscess or free air.  Also showed cholelithiasis without acute cholecystitis 1/19, right upper quadrant ultrasound showed gallstones and acute cholecystitis General surgery consult obtained.   1/23, patient underwent lap chole with intraoperative cholangiogram. Postop course uneventful.  Able to tolerate diet. Recent Labs  Lab 11/27/22 0250 11/28/22 6606 11/28/22 0716 11/29/22 3016  12/01/22 0435 12/02/22 0833  AST 150*  --  93* 49* 56* 40  ALT 73*  --  46* 32 27 30  ALKPHOS 315*  --  314* 248* 207* 208*  BILITOT 0.2*  --  0.4 0.3 0.3 0.4  PROT 5.9*  --  6.5 6.1* 5.9* 6.2*  ALBUMIN 1.7*  --  1.9* 1.7* 1.7* 1.9*  LIPASE 119*  --   --  79* 74*   --   PLT 126* 136*  --  159 191 225   AKI Initial creatinine was elevated to 5.5 due to sepsis, DKA, pancreatitis Creatinine gradually improved to normal.  Currently not on IV fluid. Recent Labs    11/23/22 0455 11/23/22 1510 11/24/22 0327 11/25/22 0526 11/26/22 0515 11/27/22 0250 11/28/22 0716 11/29/22 0336 12/01/22 0435 12/02/22 0833  BUN 63* 54* 36* 33* 31* 32* 24* '16 19 14  '$ CREATININE 2.03* 1.85* 1.28* 1.45* 1.24 1.36* 1.11 0.88 1.02 0.84   A-fib with RVR New onset A-fib in the setting of sepsis, hypovolemia PTA not on any AV node blocking agent. Currently in normal sinus rhythm.  CHADSVASC score elevated to 5 (HTN, HLD, DM2, stroke).   1/19, started on full dose Lovenox.  Postsurgically, started on Eliquis.  Okay for general surgery.  Essential hypertension PTA on amlodipine 10 mg daily, hydralazine 25 mg 3 times daily, clonidine as needed,  Currently on amlodipine 10 mg daily.  Acute encephalopathy Impaired mentation likely due to combination of sepsis, DKA, pancreatitis and in the background of history of stroke Initial CT head did not show any acute intracranial abnormality Continue to monitor mental status change  Recent right CVA with left-sided deficits and dysphagia HLD CT scan of head 1/13 showed a large remote right MCA distribution infarct with additional remote lacunar infarct at the left basal ganglia. Patient states, he was taking aspirin 81 mg daily and Lipitor 40 mg daily.  Since has been started on anticoagulation, no need to resume aspirin.  Lipitor on hold because of elevated liver enzymes Continue meclizine PRN for dizziness  Dysphagia While in ICU, patient was started on core track feeding.  Patient pulled it out on 1/20. Last seen by speech on 1/18.  On dysphagia diet.  Speech therapy to follow-up.  Prolonged qtc Initial EKG with QTc prolonged to 509 ms probably because of RVR an abnormal electrolytes  Repeat EKG 1/19 showed QTc 426  ms. Continue to monitor  GERD Continue PPI  Mild acute anemia Hb >10. No active bleeding.  Postop hemoglobin slightly low today.  No active bleeding.  Continue to monitor.   Recent Labs    11/27/22 0250 11/28/22 0442 11/29/22 0336 12/01/22 0435 12/02/22 0833  HGB 10.5* 9.3* 10.5* 9.8* 10.5*  MCV 92.9 94.1 90.1 92.1 89.7   Major depressive disorder PTA on fluoxetine.    Impaired mobility Has baseline left-sided deficits after the stroke.  Current acute weakness related to acute comorbidities and prolonged hospitalization. PT eval recommended SNF TOC following   Goals of care   Code Status: Full Code     DVT prophylaxis: Eliquis apixaban (ELIQUIS) tablet 5 mg   Antimicrobials: IV Ancef completed on 1/23 Fluid: None Consultants: None Family Communication: None at bedside  Scheduled Meds:  amLODipine  10 mg Oral Daily   apixaban  5 mg Oral BID   Chlorhexidine Gluconate Cloth  6 each Topical Q0600   Chlorhexidine Gluconate Cloth  6 each Topical Q0600   docusate  100 mg Oral BID  famotidine  20 mg Oral BID   insulin aspart  0-5 Units Subcutaneous QHS   insulin aspart  0-9 Units Subcutaneous TID WC   insulin aspart  10 Units Subcutaneous TID WC   insulin glargine-yfgn  50 Units Subcutaneous BID   mupirocin ointment  1 Application Nasal BID   sodium chloride flush  10-40 mL Intracatheter Q12H    PRN meds: sodium chloride, dextrose, hydrALAZINE, metoprolol tartrate, mouth rinse, oxyCODONE-acetaminophen, polyethylene glycol, sodium chloride flush, white petrolatum   Infusions:   sodium chloride 10 mL/hr at 11/30/22 1843    Antimicrobials: Anti-infectives (From admission, onward)    Start     Dose/Rate Route Frequency Ordered Stop   11/30/22 1041  ceFAZolin (ANCEF) 2-4 GM/100ML-% IVPB       Note to Pharmacy: Humberto Leep O: cabinet override      11/30/22 1041 11/30/22 2259   11/24/22 2200  ceFAZolin (ANCEF) IVPB 2g/100 mL premix        2 g 200 mL/hr over  30 Minutes Intravenous Every 8 hours 11/24/22 1443 11/29/22 2210   11/23/22 2200  ceFAZolin (ANCEF) IVPB 2g/100 mL premix  Status:  Discontinued        2 g 200 mL/hr over 30 Minutes Intravenous Every 8 hours 11/23/22 0923 11/24/22 1443   11/21/22 2200  ceFEPIme (MAXIPIME) 2 g in sodium chloride 0.9 % 100 mL IVPB  Status:  Discontinued        2 g 200 mL/hr over 30 Minutes Intravenous Every 24 hours 11/20/22 2257 11/23/22 0923   11/21/22 0800  metroNIDAZOLE (FLAGYL) IVPB 500 mg  Status:  Discontinued        500 mg 100 mL/hr over 60 Minutes Intravenous Every 12 hours 11/21/22 0007 11/22/22 0821   11/20/22 2359  fluconazole (DIFLUCAN) IVPB 200 mg  Status:  Discontinued        200 mg 100 mL/hr over 60 Minutes Intravenous Daily at bedtime 11/20/22 2328 11/22/22 0821   11/20/22 2256  vancomycin variable dose per unstable renal function (pharmacist dosing)  Status:  Discontinued         Does not apply See admin instructions 11/20/22 2256 11/22/22 0821   11/20/22 2030  ceFEPIme (MAXIPIME) 2 g in sodium chloride 0.9 % 100 mL IVPB        2 g 200 mL/hr over 30 Minutes Intravenous  Once 11/20/22 2022 11/20/22 2329   11/20/22 2030  metroNIDAZOLE (FLAGYL) IVPB 500 mg        500 mg 100 mL/hr over 60 Minutes Intravenous  Once 11/20/22 2022 11/20/22 2329   11/20/22 2030  vancomycin (VANCOCIN) IVPB 1000 mg/200 mL premix  Status:  Discontinued        1,000 mg 200 mL/hr over 60 Minutes Intravenous  Once 11/20/22 2022 11/20/22 2024   11/20/22 2030  vancomycin (VANCOREADY) IVPB 2000 mg/400 mL        2,000 mg 200 mL/hr over 120 Minutes Intravenous  Once 11/20/22 2024 11/21/22 0044       Skin assessment:      Diet:  Diet Order             DIET - DYS 1 Room service appropriate? Yes with Assist; Fluid consistency: Thin  Diet effective now                   Nutritional status:  Body mass index is 33.51 kg/m.  Nutrition Problem: Inadequate oral intake Etiology: inability to  eat Signs/Symptoms: NPO status  Status is: Inpatient Level of care: Med-Surg  Continue in-hospital care because: Medically stable for discharge.  Pending SNF placement at this time.  Dispo: The patient is from: Home              Anticipated d/c is to: SNF  Objective: Vitals:   12/02/22 0532 12/02/22 0756  BP: (!) 172/92 (!) 159/88  Pulse: 67 69  Resp: 18 17  Temp: 98.5 F (36.9 C) 98.4 F (36.9 C)  SpO2: 98% 96%    Intake/Output Summary (Last 24 hours) at 12/02/2022 1253 Last data filed at 12/02/2022 0535 Gross per 24 hour  Intake --  Output 926 ml  Net -926 ml   Filed Weights   11/29/22 0510 12/01/22 0504 12/02/22 0532  Weight: 122.8 kg 122.7 kg 118.4 kg   Weight change: -4.3 kg Body mass index is 33.51 kg/m.   Physical Exam: General exam: Pleasant, elderly Caucasian male.  Propped up in bed.  Not in distress Skin: No rashes, lesions or ulcers. HEENT: Atraumatic, normocephalic, no obvious bleeding.  Lungs: Clear to auscultation bilaterally CVS: Regular rate and rhythm, no murmur GI/Abd soft, distended from obesity, right upper quadrant tenderness improved., bowel sound present CNS: Alert, awake, oriented to place and person.  Continues to have left-sided deficits. Psychiatry: Mood appropriate Extremities: No pedal edema.  No tenderness  Data Review: I have personally reviewed the laboratory data and studies available.  F/u labs ordered Unresulted Labs (From admission, onward)     Start     Ordered   12/03/22 0500  CBC with Differential/Platelet  Tomorrow morning,   R       Question:  Specimen collection method  Answer:  IV Team=IV Team collect   12/02/22 1950            Total time spent in review of labs and imaging, patient evaluation, formulation of plan, documentation and communication with family: 49 minutes  Signed, Terrilee Croak, MD [facility] 12/02/2022

## 2022-12-03 DIAGNOSIS — E1110 Type 2 diabetes mellitus with ketoacidosis without coma: Secondary | ICD-10-CM | POA: Diagnosis not present

## 2022-12-03 LAB — CBC WITH DIFFERENTIAL/PLATELET
Abs Immature Granulocytes: 0.03 10*3/uL (ref 0.00–0.07)
Basophils Absolute: 0 10*3/uL (ref 0.0–0.1)
Basophils Relative: 0 %
Eosinophils Absolute: 0.2 10*3/uL (ref 0.0–0.5)
Eosinophils Relative: 3 %
HCT: 31.7 % — ABNORMAL LOW (ref 39.0–52.0)
Hemoglobin: 10.1 g/dL — ABNORMAL LOW (ref 13.0–17.0)
Immature Granulocytes: 1 %
Lymphocytes Relative: 27 %
Lymphs Abs: 1.5 10*3/uL (ref 0.7–4.0)
MCH: 29 pg (ref 26.0–34.0)
MCHC: 31.9 g/dL (ref 30.0–36.0)
MCV: 91.1 fL (ref 80.0–100.0)
Monocytes Absolute: 0.4 10*3/uL (ref 0.1–1.0)
Monocytes Relative: 8 %
Neutro Abs: 3.5 10*3/uL (ref 1.7–7.7)
Neutrophils Relative %: 61 %
Platelets: 222 10*3/uL (ref 150–400)
RBC: 3.48 MIL/uL — ABNORMAL LOW (ref 4.22–5.81)
RDW: 15.1 % (ref 11.5–15.5)
WBC: 5.7 10*3/uL (ref 4.0–10.5)
nRBC: 0 % (ref 0.0–0.2)

## 2022-12-03 LAB — GLUCOSE, CAPILLARY
Glucose-Capillary: 105 mg/dL — ABNORMAL HIGH (ref 70–99)
Glucose-Capillary: 124 mg/dL — ABNORMAL HIGH (ref 70–99)
Glucose-Capillary: 161 mg/dL — ABNORMAL HIGH (ref 70–99)
Glucose-Capillary: 165 mg/dL — ABNORMAL HIGH (ref 70–99)
Glucose-Capillary: 171 mg/dL — ABNORMAL HIGH (ref 70–99)

## 2022-12-03 MED ORDER — LOSARTAN POTASSIUM 50 MG PO TABS
50.0000 mg | ORAL_TABLET | Freq: Every day | ORAL | Status: DC
  Administered 2022-12-03 – 2022-12-06 (×4): 50 mg via ORAL
  Filled 2022-12-03 (×5): qty 1

## 2022-12-03 NOTE — TOC Progression Note (Addendum)
Transition of Care (TOC) - Progression Note    Patient Details  Name: [name] [name]. MRN: [id number] Date of Birth: 1960/12/01  Transition of Care [facility]) CM/SW Contact  Coralee Pesa, [place] Phone Number: 12/03/2022, 11:40 AM  Clinical Narrative:    CSW followed up with facility again, authorization continues to be pending. TOC will continue to follow for DC needs.   CSW followed up with facility X2. VM left requesting update on authorization status. TOC will continue to follow for DC needs.    Expected Discharge Plan: Skilled Nursing Facility Barriers to Discharge: Ship broker, Continued Medical Work up  Expected Discharge Plan and Services                                               Social Determinants of Health (SDOH) Interventions SDOH Screenings   Food Insecurity: No Food Insecurity (01/11/2018)  Transportation Needs: No Transportation Needs (01/11/2018)  Depression (PHQ2-9): Low Risk  (06/17/2022)  Financial Resource Strain: Low Risk  (01/11/2018)  Physical Activity: Inactive (01/11/2018)  Social Connections: Moderately Isolated (01/11/2018)  Stress: No Stress Concern Present (01/11/2018)  Tobacco Use: Medium Risk (12/01/2022)    Readmission Risk Interventions     No data to display

## 2022-12-03 NOTE — Progress Notes (Signed)
Physical Therapy Treatment Patient Details Name: [name] [name]. MRN: [id number] DOB: Oct 04, 1961 Today's Date: 12/03/2022   History of Present Illness 62 yo male admitted 1/13 after found on the floor at home. Pt with DKA, hypothermic, hypotensive, AFib with RVR. PMhx: CVA with lt hemiplegia, DM, vascular dementia, HTN, HLD    PT Comments    Pt was assisted to chair after mod assist to get up to side of bed.  LUE is unable to be used on walker but pt is familiar with how to manage hand placement.  However, does get distracted to get to chair quickly, and requires slowing down to repeat instructions to move LLE toward chair.  Has a moderate amount of neglect to L side, but can be refocused to move LLE.  Follow acutely for goals of PT and have instructed nursing regarding getting back to bed.  Recommendations for follow up therapy are one component of a multi-disciplinary discharge planning process, led by the attending physician.  Recommendations may be updated based on patient status, additional functional criteria and insurance authorization.  Follow Up Recommendations  Skilled nursing-short term rehab (<3 hours/day) Can patient physically be transported by private vehicle: No   Assistance Recommended at Discharge Frequent or constant Supervision/Assistance  Patient can return home with the following Two people to help with walking and/or transfers;A lot of help with bathing/dressing/bathroom;Assistance with cooking/housework;Direct supervision/assist for medications management;Direct supervision/assist for financial management;Assist for transportation;Help with stairs or ramp for entrance   Equipment Recommendations  None recommended by PT    Recommendations for Other Services       Precautions / Restrictions Precautions Precautions: Fall;Other (comment) Precaution Comments: L UE hemiparesis Restrictions Weight Bearing Restrictions: No Other Position/Activity Restrictions: pt is  densely weak on LE's     Mobility  Bed Mobility Overal bed mobility: Needs Assistance Bed Mobility: Supine to Sit     Supine to sit: Mod assist     General bed mobility comments: mod to finish scoot to EOB    Transfers Overall transfer level: Needs assistance Equipment used: Rolling walker (2 wheels) Transfers: Sit to/from Stand, Bed to chair/wheelchair/BSC Sit to Stand: Mod assist   Step pivot transfers: Mod assist       General transfer comment: mod to stand and requires help to persist with the steps to get to chair, tends to lose focus and want to lunge to chair    Ambulation/Gait               General Gait Details: deferred   Stairs             Wheelchair Mobility    Modified Rankin (Stroke Patients Only)       Balance     Sitting balance-Leahy Scale: Good       Standing balance-Leahy Scale: Poor                              Cognition Arousal/Alertness: Awake/alert Behavior During Therapy: Impulsive Overall Cognitive Status: No family/caregiver present to determine baseline cognitive functioning Area of Impairment: Problem solving, Safety/judgement, Following commands                 Orientation Level: Situation Current Attention Level: Selective Memory: Decreased short-term memory Following Commands: Follows one step commands with increased time Safety/Judgement: Decreased awareness of deficits, Decreased awareness of safety Awareness: Intellectual Problem Solving: Slow processing, Requires verbal cues, Requires tactile cues General Comments: pt was  assisted to sit on side of bed then pivot on walker and then with PT to chair.  Has trouble with sequence and safety, requires repetitive information and careful cues to not overwhelm        Exercises      General Comments General comments (skin integrity, edema, etc.): Positioning strategies used to provide support to trunk to achieve an upright sitting posture  while in recliner due to left hemiplegia.      Pertinent Vitals/Pain Pain Assessment Pain Assessment: No/denies pain    Home Living                          Prior Function            PT Goals (current goals can now be found in the care plan section) Acute Rehab PT Goals Patient Stated Goal: get stronger    Frequency    Min 3X/week      PT Plan Current plan remains appropriate    Co-evaluation              AM-PAC PT "6 Clicks" Mobility   Outcome Measure  Help needed turning from your back to your side while in a flat bed without using bedrails?: A Little Help needed moving from lying on your back to sitting on the side of a flat bed without using bedrails?: A Lot Help needed moving to and from a bed to a chair (including a wheelchair)?: A Lot Help needed standing up from a chair using your arms (e.g., wheelchair or bedside chair)?: A Lot Help needed to walk in hospital room?: Total Help needed climbing 3-5 steps with a railing? : Total 6 Click Score: 11    End of Session Equipment Utilized During Treatment: Gait belt Activity Tolerance: Patient tolerated treatment well;Patient limited by fatigue Patient left: in chair;with call bell/phone within reach;with chair alarm set Nurse Communication: Mobility status PT Visit Diagnosis: Unsteadiness on feet (R26.81);Muscle weakness (generalized) (M62.81)     Time: 1470-9295 PT Time Calculation (min) (ACUTE ONLY): 27 min  Charges:  $Therapeutic Activity: 23-37 mins         Ramond Dial 12/03/2022, 4:23 PM  Mee Hives, PT PhD Acute Rehab Dept. Number: [place] and [place]

## 2022-12-03 NOTE — Progress Notes (Signed)
PROGRESS NOTE  [name] [name].  DOB: 1961/09/20  PCP: Sandrea Hughs, NP YNW:[id number]  DOA: 11/20/2022  LOS: 12 days  Hospital Day: 14  Brief narrative: [name] [name]. is a 62 y.o. male with PMH significant for DM2, HTN, HLD, previous right CVA with left-sided deficits, dysphagia, falls who lives at home and uses walker at baseline. 1/13, patient was brought to the ED from home after he was found on the floor.  Last seen normal was 2 days prior.  Per family, patient had been lethargic for about a week.  They believe it was after he started having diarrhea due to laxative that he uses for chronic constipation.  1/9, patient had a fall at home. Per EMS, at the scene, blood pressure was low at 90/56  In the ED, patient was in A-fib with RVR at 140s, initial blood pressure was low at 78/54 Labs with glucose level 1100, anion gap 29, pH low at 7.3, serum bicarb 14, beta-hydroxybutyrate level elevated to more than 8.  WBC count 16, lactic acid 3.2, sodium level elevated to 153, potassium elevated to 6.9, creatinine elevated 5.5 Patient was started on IV fluid, IV insulin drip per DKA protocol Chest x-ray unremarkable Admitted to ICU Blood culture sent on admission showed multi species growth including Proteus Mirabella's, Staph epidermidis and lactobacillus species. Urine culture showed 50,000 CFU per mL of E. coli 1/19, transferred out to [facility]  Subjective: Patient was seen and examined this morning.  Propped up in bed.  Not in distress.  No new symptoms.  Able to tolerate food.  Blood sugar level gradually improving. No new change in last 24 hours.  Assessment and plan: Severe sepsis - POA Multi species bacteremia and UTI On admission, patient elevated WBC count, elevated lactic acid and low blood pressure.  Met criteria for severe sepsis.   Blood culture sent on admission grew multiple species including Proteus mirabilis, Staph epidermidis.  Unclear significance.  Urine culture  grew 2000 CFU per mL of E. coli, unclear significance.   Repeat blood culture sent on 1/14 did not show any growth. In any case, he improved with IV Ancef and completed 10-day course on 1/23. Recent Labs  Lab 11/28/22 0442 11/29/22 0336 12/01/22 0435 12/02/22 0833 12/03/22 0339  WBC 7.1 8.6 8.8 6.4 5.7   Type 2 diabetes mellitus uncontrolled  DKA Initial labs as above showed DKA Improved with IV insulin drip, IV fluid and electrolyte management. Subsequently switched to subcu insulin. A1c uncontrolled at 13.5 on 11/21/2022 PTA not on meds. Currently his insulin dose is being adjusted based on blood sugar trend. Currently on Semglee to 50 units twice daily.  Continue Premeal scheduled and sliding scale insulin. Diabetes care coordinator consult appreciated. Recent Labs  Lab 12/02/22 1706 12/02/22 2021 12/03/22 0030 12/03/22 0747 12/03/22 1134  GLUCAP 241* 129* 124* 105* 165*   Acute cholecystitis and cholelithiasis Acute biliary pancreatitis Initial lipase level elevated to more than 1000.   1/14, CT abdomen showed acute interstitial pancreatitis with minimal nonlocalizing peripancreatic fluid, without abscess or free air.  Also showed cholelithiasis without acute cholecystitis 1/19, right upper quadrant ultrasound showed gallstones and acute cholecystitis General surgery consult obtained.   1/23, patient underwent lap chole with intraoperative cholangiogram. Postop course uneventful.  Able to tolerate diet. Recent Labs  Lab 11/27/22 0250 11/28/22 0442 11/28/22 0716 11/29/22 0336 12/01/22 0435 12/02/22 0833 12/03/22 0339  AST 150*  --  93* 49* 56* 40  --  ALT 73*  --  46* 32 27 30  --   ALKPHOS 315*  --  314* 248* 207* 208*  --   BILITOT 0.2*  --  0.4 0.3 0.3 0.4  --   PROT 5.9*  --  6.5 6.1* 5.9* 6.2*  --   ALBUMIN 1.7*  --  1.9* 1.7* 1.7* 1.9*  --   LIPASE 119*  --   --  79* 74*  --   --   PLT 126* 136*  --  159 191 225 222   AKI Initial creatinine was  elevated to 5.5 due to sepsis, DKA, pancreatitis Creatinine gradually improved to normal.  Currently not on IV fluid. Recent Labs    11/23/22 0455 11/23/22 1510 11/24/22 0327 11/25/22 0526 11/26/22 0515 11/27/22 0250 11/28/22 0716 11/29/22 0336 12/01/22 0435 12/02/22 0833  BUN 63* 54* 36* 33* 31* 32* 24* '16 19 14  '$ CREATININE 2.03* 1.85* 1.28* 1.45* 1.24 1.36* 1.11 0.88 1.02 0.84   A-fib with RVR New onset A-fib in the setting of sepsis, hypovolemia PTA not on any AV node blocking agent. Currently in normal sinus rhythm.  CHADSVASC score elevated to 5 (HTN, HLD, DM2, stroke).  Currently on Eliquis.  Morning  Essential hypertension PTA on amlodipine 10 mg daily, hydralazine 25 mg 3 times daily, clonidine as needed,  Currently on amlodipine 10 mg daily.  Blood pressure elevated.  Resume hydralazine 25 mg 3 times daily.  Acute encephalopathy Impaired mentation likely due to combination of sepsis, DKA, pancreatitis and in the background of history of stroke Initial CT head did not show any acute intracranial abnormality Continue to monitor mental status change  Recent right CVA with left-sided deficits and dysphagia HLD CT scan of head 1/13 showed a large remote right MCA distribution infarct with additional remote lacunar infarct at the left basal ganglia. Patient states, he was taking aspirin 81 mg daily and Lipitor 40 mg daily.  Since has been started on anticoagulation, no need to resume aspirin.  Lipitor on hold because of elevated liver enzymes Continue meclizine PRN for dizziness  Dysphagia While in ICU, patient was started on core track feeding.  Patient pulled it out on 1/20. Last seen by speech on 1/18.  On dysphagia diet.  Speech therapy to follow-up.  Prolonged qtc Initial EKG with QTc prolonged to 509 ms probably because of RVR an abnormal electrolytes  Repeat EKG 1/19 showed QTc 426 ms. Continue to monitor  GERD Continue PPI  Mild acute anemia Hb >10. No  active bleeding.  Postop hemoglobin slightly low today.  No active bleeding.  Continue to monitor.   Recent Labs    11/28/22 0442 11/29/22 0336 12/01/22 0435 12/02/22 0833 12/03/22 0339  HGB 9.3* 10.5* 9.8* 10.5* 10.1*  MCV 94.1 90.1 92.1 89.7 91.1   Major depressive disorder PTA on fluoxetine.    Impaired mobility Has baseline left-sided deficits after the stroke.  Current acute weakness related to acute comorbidities and prolonged hospitalization. PT eval recommended SNF TOC following   Goals of care   Code Status: Full Code     DVT prophylaxis: Eliquis apixaban (ELIQUIS) tablet 5 mg   Antimicrobials: IV Ancef completed on 1/23 Fluid: None Consultants: None Family Communication: None at bedside  Scheduled Meds:  amLODipine  10 mg Oral Daily   apixaban  5 mg Oral BID   Chlorhexidine Gluconate Cloth  6 each Topical Q0600   Chlorhexidine Gluconate Cloth  6 each Topical Q0600   docusate  100 mg  Oral BID   famotidine  20 mg Oral BID   insulin aspart  0-5 Units Subcutaneous QHS   insulin aspart  0-9 Units Subcutaneous TID WC   insulin aspart  10 Units Subcutaneous TID WC   insulin glargine-yfgn  50 Units Subcutaneous BID   losartan  50 mg Oral Daily   multivitamin with minerals  1 tablet Oral Daily   mupirocin ointment  1 Application Nasal BID   sodium chloride flush  10-40 mL Intracatheter Q12H    PRN meds: sodium chloride, dextrose, hydrALAZINE, metoprolol tartrate, mouth rinse, oxyCODONE-acetaminophen, polyethylene glycol, sodium chloride flush, white petrolatum   Infusions:   sodium chloride 10 mL/hr at 11/30/22 1843    Antimicrobials: Anti-infectives (From admission, onward)    Start     Dose/Rate Route Frequency Ordered Stop   11/30/22 1041  ceFAZolin (ANCEF) 2-4 GM/100ML-% IVPB       Note to Pharmacy: Humberto Leep O: cabinet override      11/30/22 1041 11/30/22 2259   11/24/22 2200  ceFAZolin (ANCEF) IVPB 2g/100 mL premix        2 g 200 mL/hr over  30 Minutes Intravenous Every 8 hours 11/24/22 1443 11/29/22 2210   11/23/22 2200  ceFAZolin (ANCEF) IVPB 2g/100 mL premix  Status:  Discontinued        2 g 200 mL/hr over 30 Minutes Intravenous Every 8 hours 11/23/22 0923 11/24/22 1443   11/21/22 2200  ceFEPIme (MAXIPIME) 2 g in sodium chloride 0.9 % 100 mL IVPB  Status:  Discontinued        2 g 200 mL/hr over 30 Minutes Intravenous Every 24 hours 11/20/22 2257 11/23/22 0923   11/21/22 0800  metroNIDAZOLE (FLAGYL) IVPB 500 mg  Status:  Discontinued        500 mg 100 mL/hr over 60 Minutes Intravenous Every 12 hours 11/21/22 0007 11/22/22 0821   11/20/22 2359  fluconazole (DIFLUCAN) IVPB 200 mg  Status:  Discontinued        200 mg 100 mL/hr over 60 Minutes Intravenous Daily at bedtime 11/20/22 2328 11/22/22 0821   11/20/22 2256  vancomycin variable dose per unstable renal function (pharmacist dosing)  Status:  Discontinued         Does not apply See admin instructions 11/20/22 2256 11/22/22 0821   11/20/22 2030  ceFEPIme (MAXIPIME) 2 g in sodium chloride 0.9 % 100 mL IVPB        2 g 200 mL/hr over 30 Minutes Intravenous  Once 11/20/22 2022 11/20/22 2329   11/20/22 2030  metroNIDAZOLE (FLAGYL) IVPB 500 mg        500 mg 100 mL/hr over 60 Minutes Intravenous  Once 11/20/22 2022 11/20/22 2329   11/20/22 2030  vancomycin (VANCOCIN) IVPB 1000 mg/200 mL premix  Status:  Discontinued        1,000 mg 200 mL/hr over 60 Minutes Intravenous  Once 11/20/22 2022 11/20/22 2024   11/20/22 2030  vancomycin (VANCOREADY) IVPB 2000 mg/400 mL        2,000 mg 200 mL/hr over 120 Minutes Intravenous  Once 11/20/22 2024 11/21/22 0044       Skin assessment:      Diet:  Diet Order             DIET - DYS 1 Room service appropriate? Yes with Assist; Fluid consistency: Thin  Diet effective now                   Nutritional status:  Body  mass index is 33.51 kg/m.  Nutrition Problem: Inadequate oral intake Etiology: inability to  eat Signs/Symptoms: NPO status      Status is: Inpatient Level of care: Med-Surg  Continue in-hospital care because: Medically stable for discharge.  Pending SNF placement at this time.  Dispo: The patient is from: Home              Anticipated d/c is to: SNF  Objective: Vitals:   12/03/22 0628 12/03/22 0745  BP: (!) 143/85 (!) 162/94  Pulse: 61 (!) 57  Resp: 18 17  Temp: 98.4 F (36.9 C) 98.6 F (37 C)  SpO2: 96% (!) 68%    Intake/Output Summary (Last 24 hours) at 12/03/2022 1345 Last data filed at 12/03/2022 0200 Gross per 24 hour  Intake --  Output 1100 ml  Net -1100 ml   Filed Weights   11/29/22 0510 12/01/22 0504 12/02/22 0532  Weight: 122.8 kg 122.7 kg 118.4 kg   Weight change:  Body mass index is 33.51 kg/m.   Physical Exam: General exam: Pleasant, elderly Caucasian male.  Propped up in bed.  Not in distress Skin: No rashes, lesions or ulcers. HEENT: Atraumatic, normocephalic, no obvious bleeding.  Lungs: Clear to auscultation bilaterally CVS: Regular rate and rhythm, no murmur GI/Abd soft, distended from obesity, right upper quadrant tenderness improved., bowel sound present CNS: Alert, awake, oriented to place and person.  Continues to have left-sided deficits. Psychiatry: Mood appropriate Extremities: No pedal edema.  No tenderness  Data Review: I have personally reviewed the laboratory data and studies available.  F/u labs ordered Unresulted Labs (From admission, onward)    None       Total time spent in review of labs and imaging, patient evaluation, formulation of plan, documentation and communication with family: 36 minutes  Signed, Terrilee Croak, MD [facility] 12/03/2022

## 2022-12-03 NOTE — Progress Notes (Signed)
Occupational Therapy Treatment Patient Details Name: [name] [name]. MRN: [id number] DOB: 12/27/1960 Today's Date: 12/03/2022   History of present illness 62 yo male admitted 1/13 after found on the floor at home. Pt with DKA, hypothermic, hypotensive, AFib with RVR. PMhx: CVA with lt hemiplegia, DM, vascular dementia, HTN, HLD   OT comments  Pt in recliner upon therapy arrival and agreeable to participate in OT treatment. Pt presents with heavy left lateral lean and positioning techniques were utilized such as pillow up LUE and egg carton foam behind left scapula which allowed pt to achieve an upright posture. Session focused on RUE strengthening utilizing red resistive t-band. Pt provided with VC for form and technique while focusing on shoulder exercises. Rest breaks taken as needed.    Recommendations for follow up therapy are one component of a multi-disciplinary discharge planning process, led by the attending physician.  Recommendations may be updated based on patient status, additional functional criteria and insurance authorization.    Follow Up Recommendations  Skilled nursing-short term rehab (<3 hours/day)     Assistance Recommended at Discharge Frequent or constant Supervision/Assistance  Patient can return home with the following  A lot of help with walking and/or transfers;A lot of help with bathing/dressing/bathroom;Assistance with cooking/housework;Direct supervision/assist for medications management   Equipment Recommendations  Other (comment) (defer to next venue)       Precautions / Restrictions Precautions Precautions: Fall;Other (comment) Precaution Comments: L UE hemiparesis Restrictions Weight Bearing Restrictions: No Other Position/Activity Restrictions: pt is densely weak on LE's              12/03/22 1620  General Comments  General comments (skin integrity, edema, etc.) Positioning strategies used to provide support to trunk to achieve an upright  sitting posture while in recliner due to left hemiplegia.                             ADL either performed or assessed with clinical judgement       Cognition Arousal/Alertness: Awake/alert Behavior During Therapy: WFL for tasks assessed/performed Overall Cognitive Status: Difficult to assess          Exercises General Exercises - Upper Extremity Shoulder Flexion: Strengthening, Right, 10 reps, Seated, Theraband Theraband Level (Shoulder Flexion): Level 2 (Red) Shoulder Extension: Strengthening, Right, 10 reps, Seated, Theraband Theraband Level (Shoulder Extension): Level 2 (Red) Shoulder Horizontal ABduction: Strengthening, Right, 10 reps, Seated, Theraband Theraband Level (Shoulder Horizontal Abduction): Level 2 (Red) Shoulder Horizontal ADduction: Strengthening, Right, 10 reps, Seated, Theraband Theraband Level (Shoulder Horizontal Adduction): Level 2 (Red)       General Comments Positioning strategies used to provide support to trunk to achieve an upright sitting posture while in recliner due to left hemiplegia.    Pertinent Vitals/ Pain       Pain Assessment Pain Assessment: No/denies pain         Frequency  Min 2X/week        Progress Toward Goals  OT Goals(current goals can now be found in the care plan section)  Progress towards OT goals: Progressing toward goals     Plan Discharge plan remains appropriate;Frequency remains appropriate       AM-PAC OT "6 Clicks" Daily Activity     Outcome Measure   Help from another person eating meals?: A Little Help from another person taking care of personal grooming?: A Little Help from another person toileting, which includes using toliet, bedpan, or urinal?: A  Lot   Help from another person to put on and taking off regular upper body clothing?: A Lot Help from another person to put on and taking off regular lower body clothing?: Total 6 Click Score: 11    End of Session    OT Visit  Diagnosis: Other abnormalities of gait and mobility (R26.89);Unsteadiness on feet (R26.81);Muscle weakness (generalized) (M62.81)   Activity Tolerance Patient tolerated treatment well   Patient Left in chair;with call bell/phone within reach;with chair alarm set           Time: [phone] OT Time Calculation (min): 22 min  Charges: OT General Charges $OT Visit: 1 Visit OT Treatments $Therapeutic Exercise: 8-22 mins  Ailene Ravel, OTR/L,CBIS  Supplemental OT - [facility] and [facility] Secure Chat Preferred    [name] [name], Clarene Duke 12/03/2022, 4:24 PM

## 2022-12-04 DIAGNOSIS — E1110 Type 2 diabetes mellitus with ketoacidosis without coma: Secondary | ICD-10-CM | POA: Diagnosis not present

## 2022-12-04 LAB — GLUCOSE, CAPILLARY
Glucose-Capillary: 57 mg/dL — ABNORMAL LOW (ref 70–99)
Glucose-Capillary: 85 mg/dL (ref 70–99)
Glucose-Capillary: 130 mg/dL — ABNORMAL HIGH (ref 70–99)
Glucose-Capillary: 223 mg/dL — ABNORMAL HIGH (ref 70–99)
Glucose-Capillary: 229 mg/dL — ABNORMAL HIGH (ref 70–99)

## 2022-12-04 MED ORDER — INSULIN ASPART 100 UNIT/ML IJ SOLN
5.0000 [IU] | Freq: Three times a day (TID) | INTRAMUSCULAR | Status: DC
  Administered 2022-12-04 – 2022-12-06 (×6): 5 [IU] via SUBCUTANEOUS

## 2022-12-04 NOTE — Progress Notes (Addendum)
PROGRESS NOTE  [name] [name].  DOB: [birth year]  PCP: Sandrea Hughs, NP TML:[id number]  DOA: 11/20/2022  LOS: 13 days  Hospital Day: 15  Brief narrative: [name] is a 62 y.o. male with PMH significant for DM2, HTN, HLD, previous right CVA with left-sided deficits, dysphagia, falls who lives at home and uses walker at baseline. 1/13, patient was brought to the ED from home after he was found on the floor.  Last seen normal was 2 days prior.  Per family, patient had been lethargic for about a week.  They believe it was after he started having diarrhea due to laxative that he uses for chronic constipation.  1/9, patient had a fall at home. Per EMS, at the scene, blood pressure was low at 90/56  In the ED, patient was in A-fib with RVR at 140s, initial blood pressure was low at 78/54 Labs with glucose level 1100, anion gap 29, pH low at 7.3, serum bicarb 14, beta-hydroxybutyrate level elevated to more than 8.  WBC count 16, lactic acid 3.2, sodium level elevated to 153, potassium elevated to 6.9, creatinine elevated 5.5 Patient was started on IV fluid, IV insulin drip per DKA protocol Chest x-ray unremarkable Admitted to ICU Blood culture sent on admission showed multi species growth including Proteus Mirabella's, Staph epidermidis and lactobacillus species. Urine culture showed 50,000 CFU per mL of E. coli 1/19, transferred out to [facility]  Subjective: Patient was seen and examined this morning.  Not in distress.  Blood pressure elevated to 170s this morning.  Blood sugar level was low.  Assessment and plan: Severe sepsis - POA Multi species bacteremia and UTI On admission, patient elevated WBC count, elevated lactic acid and low blood pressure.  Met criteria for severe sepsis.   Blood culture sent on admission grew multiple species including Proteus mirabilis, Staph epidermidis.  Unclear significance.  Urine culture grew 2000 CFU per mL of E. coli, unclear significance.    Repeat blood culture sent on 1/14 did not show any growth. In any case, he improved with IV Ancef and completed 10-day course on 1/23. Recent Labs  Lab 11/28/22 0442 11/29/22 0336 12/01/22 0435 12/02/22 0833 12/03/22 0339  WBC 7.1 8.6 8.8 6.4 5.7   Type 2 diabetes mellitus uncontrolled  DKA Initial labs as above showed DKA Improved with IV insulin drip, IV fluid and electrolyte management. Subsequently switched to subcu insulin. A1c uncontrolled at 13.5 on 11/21/2022 PTA not on meds. Currently his insulin dose is being adjusted based on blood sugar trend. This morning sugar level was low at 57.  PRN scheduled insulin was skipped. Currently on Semglee to 50 units twice daily.  Continue reduced dose of Premeal scheduled and sliding scale insulin. Diabetes care coordinator consult appreciated. Recent Labs  Lab 12/03/22 1716 12/03/22 1957 12/04/22 0810 12/04/22 0902 12/04/22 1131  GLUCAP 171* 161* 57* 85 130*   Acute cholecystitis and cholelithiasis Acute biliary pancreatitis Initial lipase level elevated to more than 1000.   1/14, CT abdomen showed acute interstitial pancreatitis with minimal nonlocalizing peripancreatic fluid, without abscess or free air.  Also showed cholelithiasis without acute cholecystitis 1/19, right upper quadrant ultrasound showed gallstones and acute cholecystitis General surgery consult obtained.   1/23, patient underwent lap chole with intraoperative cholangiogram. Postop course uneventful.  Able to tolerate diet. Recent Labs  Lab 11/28/22 0442 11/28/22 0716 11/29/22 0336 12/01/22 0435 12/02/22 0833 12/03/22 0339  AST  --  93* 49* 56* 40  --  ALT  --  46* 32 27 30  --   ALKPHOS  --  314* 248* 207* 208*  --   BILITOT  --  0.4 0.3 0.3 0.4  --   PROT  --  6.5 6.1* 5.9* 6.2*  --   ALBUMIN  --  1.9* 1.7* 1.7* 1.9*  --   LIPASE  --   --  79* 74*  --   --   PLT 136*  --  159 191 225 222   AKI Initial creatinine was elevated to 5.5 due to  sepsis, DKA, pancreatitis Creatinine gradually improved to normal.  Currently not on IV fluid. Recent Labs    11/23/22 0455 11/23/22 1510 11/24/22 0327 11/25/22 0526 11/26/22 0515 11/27/22 0250 11/28/22 0716 11/29/22 0336 12/01/22 0435 12/02/22 0833  BUN 63* 54* 36* 33* 31* 32* 24* '16 19 14  '$ CREATININE 2.03* 1.85* 1.28* 1.45* 1.24 1.36* 1.11 0.88 1.02 0.84   A-fib with RVR New onset A-fib in the setting of sepsis, hypovolemia PTA not on any AV node blocking agent. Currently in normal sinus rhythm.  CHADSVASC score elevated to 5 (HTN, HLD, DM2, stroke).  Currently on Eliquis.  Morning  Essential hypertension PTA on amlodipine 10 mg daily, hydralazine 25 mg 3 times daily, clonidine as needed,  Currently on amlodipine 10 mg daily and hydralazine 25 mg 3 times daily.  Blood pressure remains elevated.  With coexisting diabetes, I added osartan 50 mg daily.  Acute encephalopathy Impaired mentation likely due to combination of sepsis, DKA, pancreatitis and in the background of history of stroke Initial CT head did not show any acute intracranial abnormality Continue to monitor mental status change  Recent right CVA with left-sided deficits and dysphagia HLD CT scan of head 1/13 showed a large remote right MCA distribution infarct with additional remote lacunar infarct at the left basal ganglia. Patient states, he was taking aspirin 81 mg daily and Lipitor 40 mg daily.  Since has been started on anticoagulation, no need to resume aspirin.  Lipitor on hold because of elevated liver enzymes Continue meclizine PRN for dizziness  Dysphagia While in ICU, patient was started on core track feeding.  Patient pulled it out on 1/20. Last seen by speech on 1/18.  On dysphagia diet.  Speech therapy to follow-up.  Prolonged qtc Initial EKG with QTc prolonged to 509 ms probably because of RVR and abnormal electrolytes  Repeat EKG 1/19 showed QTc 426 ms. Continue to monitor  GERD Continue  PPI  Mild acute anemia Hb >10. No active bleeding.  Postop hemoglobin slightly low today.  No active bleeding.  Continue to monitor.   Recent Labs    11/28/22 0442 11/29/22 0336 12/01/22 0435 12/02/22 0833 12/03/22 0339  HGB 9.3* 10.5* 9.8* 10.5* 10.1*  MCV 94.1 90.1 92.1 89.7 91.1   Major depressive disorder PTA on fluoxetine.    Impaired mobility Has baseline left-sided deficits after the stroke.  Current acute weakness related to acute comorbidities and prolonged hospitalization. PT eval recommended SNF TOC following   Goals of care   Code Status: Full Code     DVT prophylaxis: Eliquis apixaban (ELIQUIS) tablet 5 mg   Antimicrobials: Completed course of IV Ancef Fluid: None Consultants: None Family Communication: None at bedside  Status is: Inpatient Level of care: Med-Surg   Dispo: Patient is from: Home              Anticipated d/c is to: SNF Continue in-hospital care because: Pending SNF authorization  Scheduled Meds:  amLODipine  10 mg Oral Daily   apixaban  5 mg Oral BID   Chlorhexidine Gluconate Cloth  6 each Topical Q0600   Chlorhexidine Gluconate Cloth  6 each Topical Q0600   docusate  100 mg Oral BID   famotidine  20 mg Oral BID   insulin aspart  0-5 Units Subcutaneous QHS   insulin aspart  0-9 Units Subcutaneous TID WC   insulin aspart  5 Units Subcutaneous TID WC   insulin glargine-yfgn  50 Units Subcutaneous BID   losartan  50 mg Oral Daily   multivitamin with minerals  1 tablet Oral Daily   mupirocin ointment  1 Application Nasal BID   sodium chloride flush  10-40 mL Intracatheter Q12H    PRN meds: sodium chloride, dextrose, hydrALAZINE, metoprolol tartrate, mouth rinse, oxyCODONE-acetaminophen, polyethylene glycol, sodium chloride flush, white petrolatum   Infusions:   sodium chloride 10 mL/hr at 11/30/22 1843    Antimicrobials: Anti-infectives (From admission, onward)    Start     Dose/Rate Route Frequency Ordered Stop   11/30/22  1041  ceFAZolin (ANCEF) 2-4 GM/100ML-% IVPB       Note to Pharmacy: Humberto Leep O: cabinet override      11/30/22 1041 11/30/22 2259   11/24/22 2200  ceFAZolin (ANCEF) IVPB 2g/100 mL premix        2 g 200 mL/hr over 30 Minutes Intravenous Every 8 hours 11/24/22 1443 11/29/22 2210   11/23/22 2200  ceFAZolin (ANCEF) IVPB 2g/100 mL premix  Status:  Discontinued        2 g 200 mL/hr over 30 Minutes Intravenous Every 8 hours 11/23/22 0923 11/24/22 1443   11/21/22 2200  ceFEPIme (MAXIPIME) 2 g in sodium chloride 0.9 % 100 mL IVPB  Status:  Discontinued        2 g 200 mL/hr over 30 Minutes Intravenous Every 24 hours 11/20/22 2257 11/23/22 0923   11/21/22 0800  metroNIDAZOLE (FLAGYL) IVPB 500 mg  Status:  Discontinued        500 mg 100 mL/hr over 60 Minutes Intravenous Every 12 hours 11/21/22 0007 11/22/22 0821   11/20/22 2359  fluconazole (DIFLUCAN) IVPB 200 mg  Status:  Discontinued        200 mg 100 mL/hr over 60 Minutes Intravenous Daily at bedtime 11/20/22 2328 11/22/22 0821   11/20/22 2256  vancomycin variable dose per unstable renal function (pharmacist dosing)  Status:  Discontinued         Does not apply See admin instructions 11/20/22 2256 11/22/22 0821   11/20/22 2030  ceFEPIme (MAXIPIME) 2 g in sodium chloride 0.9 % 100 mL IVPB        2 g 200 mL/hr over 30 Minutes Intravenous  Once 11/20/22 2022 11/20/22 2329   11/20/22 2030  metroNIDAZOLE (FLAGYL) IVPB 500 mg        500 mg 100 mL/hr over 60 Minutes Intravenous  Once 11/20/22 2022 11/20/22 2329   11/20/22 2030  vancomycin (VANCOCIN) IVPB 1000 mg/200 mL premix  Status:  Discontinued        1,000 mg 200 mL/hr over 60 Minutes Intravenous  Once 11/20/22 2022 11/20/22 2024   11/20/22 2030  vancomycin (VANCOREADY) IVPB 2000 mg/400 mL        2,000 mg 200 mL/hr over 120 Minutes Intravenous  Once 11/20/22 2024 11/21/22 0044       Skin assessment:      Diet:  Diet Order  DIET - DYS 1 Room service appropriate? Yes  with Assist; Fluid consistency: Thin  Diet effective now                   Nutritional status:  Body mass index is 33.74 kg/m.  Nutrition Problem: Inadequate oral intake Etiology: inability to eat Signs/Symptoms: NPO status      Objective: Vitals:   12/04/22 0452 12/04/22 1009  BP: (!) 157/81 (!) 179/91  Pulse: (!) 53 61  Resp: 17 16  Temp: 98.6 F (37 C) 97.9 F (36.6 C)  SpO2: 100% 95%    Intake/Output Summary (Last 24 hours) at 12/04/2022 1404 Last data filed at 12/04/2022 0457 Gross per 24 hour  Intake --  Output 900 ml  Net -900 ml   Filed Weights   12/01/22 0504 12/02/22 0532 12/04/22 0452  Weight: 122.7 kg 118.4 kg 119.2 kg   Weight change:  Body mass index is 33.74 kg/m.   Physical Exam: General exam: Pleasant, elderly Caucasian male.  Propped up in bed.  Not in distress Skin: No rashes, lesions or ulcers. HEENT: Atraumatic, normocephalic, no obvious bleeding.  Lungs: Clear to auscultation bilaterally CVS: Regular rate and rhythm, no murmur GI/Abd soft, distended from obesity, right upper quadrant tenderness improved., bowel sound present CNS: Alert, awake, oriented to place and person.  Continues to have left-sided deficits. Psychiatry: Mood appropriate Extremities: No pedal edema.  No tenderness  Data Review: I have personally reviewed the laboratory data and studies available.  F/u labs ordered Unresulted Labs (From admission, onward)    None       Total time spent in review of labs and imaging, patient evaluation, formulation of plan, documentation and communication with family: 48 minutes  Signed, Terrilee Croak, MD [facility] 12/04/2022

## 2022-12-05 DIAGNOSIS — E1110 Type 2 diabetes mellitus with ketoacidosis without coma: Secondary | ICD-10-CM | POA: Diagnosis not present

## 2022-12-05 LAB — GLUCOSE, CAPILLARY
Glucose-Capillary: 53 mg/dL — ABNORMAL LOW (ref 70–99)
Glucose-Capillary: 145 mg/dL — ABNORMAL HIGH (ref 70–99)
Glucose-Capillary: 149 mg/dL — ABNORMAL HIGH (ref 70–99)
Glucose-Capillary: 152 mg/dL — ABNORMAL HIGH (ref 70–99)
Glucose-Capillary: 214 mg/dL — ABNORMAL HIGH (ref 70–99)

## 2022-12-05 MED ORDER — INSULIN GLARGINE-YFGN 100 UNIT/ML ~~LOC~~ SOLN
45.0000 [IU] | Freq: Two times a day (BID) | SUBCUTANEOUS | Status: DC
  Administered 2022-12-05 (×2): 45 [IU] via SUBCUTANEOUS
  Filled 2022-12-05 (×4): qty 0.45

## 2022-12-05 NOTE — Progress Notes (Signed)
PROGRESS NOTE  [name] [name].  DOB: December 11, 1960  PCP: Sandrea Hughs, NP VCB:[id number]  DOA: 11/20/2022  LOS: 14 days  Hospital Day: 16  Brief narrative: [name] Zeiders. is a 62 y.o. male with PMH significant for DM2, HTN, HLD, previous right CVA with left-sided deficits, dysphagia, falls who lives at home and uses walker at baseline. 1/13, patient was brought to the ED from home after he was found on the floor.  Last seen normal was 2 days prior.  Per family, patient had been lethargic for about a week.  They believe it was after he started having diarrhea due to laxative that he uses for chronic constipation.  1/9, patient had a fall at home. Per EMS, at the scene, blood pressure was low at 90/56  In the ED, patient was in A-fib with RVR at 140s, initial blood pressure was low at 78/54 Labs with glucose level 1100, anion gap 29, pH low at 7.3, serum bicarb 14, beta-hydroxybutyrate level elevated to more than 8.  WBC count 16, lactic acid 3.2, sodium level elevated to 153, potassium elevated to 6.9, creatinine elevated 5.5 Patient was started on IV fluid, IV insulin drip per DKA protocol Chest x-ray unremarkable Admitted to ICU Blood culture sent on admission showed multi species growth including Proteus Mirabella's, Staph epidermidis and lactobacillus species. Urine culture showed 50,000 CFU per mL of E. coli 1/19, transferred out to [facility]  Subjective: Patient was seen and examined this morning.  Not in distress.  Propped up in bed.  No new symptoms. Blood sugar low this morning.   Pending SNF variability.  Assessment and plan: Severe sepsis - POA Multi species bacteremia and UTI On admission, patient elevated WBC count, elevated lactic acid and low blood pressure.  Met criteria for severe sepsis.   Blood culture sent on admission grew multiple species including Proteus mirabilis, Staph epidermidis.  Unclear significance.  Urine culture grew 2000 CFU per mL of E. coli,  unclear significance.   Repeat blood culture sent on 1/14 did not show any growth. In any case, he improved with IV Ancef and completed 10-day course on 1/23. Recent Labs  Lab 11/29/22 0336 12/01/22 0435 12/02/22 0833 12/03/22 0339  WBC 8.6 8.8 6.4 5.7    Type 2 diabetes mellitus uncontrolled  DKA Initial labs as above showed DKA Improved with IV insulin drip, IV fluid and electrolyte management. Subsequently switched to subcu insulin. A1c uncontrolled at 13.5 on 11/21/2022 PTA not on meds. Currently his insulin dose is being adjusted based on blood sugar trend. This morning sugar level was low at 53 like yesterday.  Currently on Semglee to 50 units twice daily.  Reduce Semglee to 45 units twice daily.  Continue reduced dose of Premeal scheduled and sliding scale insulin. Diabetes care coordinator consult appreciated. Recent Labs  Lab 12/04/22 1131 12/04/22 1653 12/04/22 2030 12/05/22 0750 12/05/22 0932  GLUCAP 130* 223* 229* 53* 145*    Acute cholecystitis and cholelithiasis Acute biliary pancreatitis Initial lipase level elevated to more than 1000.   1/14, CT abdomen showed acute interstitial pancreatitis with minimal nonlocalizing peripancreatic fluid, without abscess or free air.  Also showed cholelithiasis without acute cholecystitis 1/19, right upper quadrant ultrasound showed gallstones and acute cholecystitis General surgery consult obtained.   1/23, patient underwent lap chole with intraoperative cholangiogram. Postop course uneventful.  Able to tolerate diet.  AKI Initial creatinine was elevated to 5.5 due to sepsis, DKA, pancreatitis Creatinine gradually improved to normal.  Currently not on IV fluid. Recent Labs    11/23/22 0455 11/23/22 1510 11/24/22 0327 11/25/22 0526 11/26/22 0515 11/27/22 0250 11/28/22 0716 11/29/22 0336 12/01/22 0435 12/02/22 0833  BUN 63* 54* 36* 33* 31* 32* 24* '16 19 14  '$ CREATININE 2.03* 1.85* 1.28* 1.45* 1.24 1.36* 1.11  0.88 1.02 0.84    A-fib with RVR New onset A-fib in the setting of sepsis, hypovolemia PTA not on any AV node blocking agent. Currently in normal sinus rhythm.  CHADSVASC score elevated to 5 (HTN, HLD, DM2, stroke).  Currently on Eliquis.  Morning  Essential hypertension PTA on amlodipine 10 mg daily, hydralazine 25 mg 3 times daily, clonidine as needed,  Currently on amlodipine 10 mg daily and hydralazine 25 mg 3 times daily.  Blood pressure remains elevated.  With coexisting diabetes, I added osartan 50 mg daily.  Acute encephalopathy Impaired mentation likely due to combination of sepsis, DKA, pancreatitis and in the background of history of stroke Initial CT head did not show any acute intracranial abnormality Continue to monitor mental status change  Recent right CVA with left-sided deficits and dysphagia HLD CT scan of head 1/13 showed a large remote right MCA distribution infarct with additional remote lacunar infarct at the left basal ganglia. Patient states, he was taking aspirin 81 mg daily and Lipitor 40 mg daily.  Since has been started on anticoagulation, no need to resume aspirin.  Lipitor on hold because of elevated liver enzymes Continue meclizine PRN for dizziness  Dysphagia While in ICU, patient was started on core track feeding.  Patient pulled it out on 1/20. Last seen by speech on 1/18.  On dysphagia diet.  Speech therapy to follow-up.  Prolonged qtc Initial EKG with QTc prolonged to 509 ms probably because of RVR and abnormal electrolytes  Repeat EKG 1/19 showed QTc 426 ms. Continue to monitor  GERD Continue PPI  Mild acute anemia Hb >10. No active bleeding.  Postop hemoglobin slightly low today.  No active bleeding.  Continue to monitor.   Recent Labs    11/28/22 0442 11/29/22 0336 12/01/22 0435 12/02/22 0833 12/03/22 0339  HGB 9.3* 10.5* 9.8* 10.5* 10.1*  MCV 94.1 90.1 92.1 89.7 91.1    Major depressive disorder PTA on fluoxetine.     Impaired mobility Has baseline left-sided deficits after the stroke.  Current acute weakness related to acute comorbidities and prolonged hospitalization. PT eval recommended SNF TOC following   Goals of care   Code Status: Full Code     DVT prophylaxis: Eliquis apixaban (ELIQUIS) tablet 5 mg   Antimicrobials: Completed course of IV Ancef Fluid: None Consultants: None Family Communication: None at bedside  Status is: Inpatient Level of care: Med-Surg   Dispo: Patient is from: Home              Anticipated d/c is to: SNF Continue in-hospital care because: Pending SNF authorization  Scheduled Meds:  amLODipine  10 mg Oral Daily   apixaban  5 mg Oral BID   Chlorhexidine Gluconate Cloth  6 each Topical Q0600   docusate  100 mg Oral BID   famotidine  20 mg Oral BID   insulin aspart  0-5 Units Subcutaneous QHS   insulin aspart  0-9 Units Subcutaneous TID WC   insulin aspart  5 Units Subcutaneous TID WC   insulin glargine-yfgn  45 Units Subcutaneous BID   losartan  50 mg Oral Daily   multivitamin with minerals  1 tablet Oral Daily   sodium chloride  flush  10-40 mL Intracatheter Q12H    PRN meds: sodium chloride, dextrose, hydrALAZINE, metoprolol tartrate, mouth rinse, oxyCODONE-acetaminophen, polyethylene glycol, sodium chloride flush, white petrolatum   Infusions:   sodium chloride 10 mL/hr at 11/30/22 1843    Antimicrobials: Anti-infectives (From admission, onward)    Start     Dose/Rate Route Frequency Ordered Stop   11/30/22 1041  ceFAZolin (ANCEF) 2-4 GM/100ML-% IVPB       Note to Pharmacy: Humberto Leep O: cabinet override      11/30/22 1041 11/30/22 2259   11/24/22 2200  ceFAZolin (ANCEF) IVPB 2g/100 mL premix        2 g 200 mL/hr over 30 Minutes Intravenous Every 8 hours 11/24/22 1443 11/29/22 2210   11/23/22 2200  ceFAZolin (ANCEF) IVPB 2g/100 mL premix  Status:  Discontinued        2 g 200 mL/hr over 30 Minutes Intravenous Every 8 hours 11/23/22 0923  11/24/22 1443   11/21/22 2200  ceFEPIme (MAXIPIME) 2 g in sodium chloride 0.9 % 100 mL IVPB  Status:  Discontinued        2 g 200 mL/hr over 30 Minutes Intravenous Every 24 hours 11/20/22 2257 11/23/22 0923   11/21/22 0800  metroNIDAZOLE (FLAGYL) IVPB 500 mg  Status:  Discontinued        500 mg 100 mL/hr over 60 Minutes Intravenous Every 12 hours 11/21/22 0007 11/22/22 0821   11/20/22 2359  fluconazole (DIFLUCAN) IVPB 200 mg  Status:  Discontinued        200 mg 100 mL/hr over 60 Minutes Intravenous Daily at bedtime 11/20/22 2328 11/22/22 0821   11/20/22 2256  vancomycin variable dose per unstable renal function (pharmacist dosing)  Status:  Discontinued         Does not apply See admin instructions 11/20/22 2256 11/22/22 0821   11/20/22 2030  ceFEPIme (MAXIPIME) 2 g in sodium chloride 0.9 % 100 mL IVPB        2 g 200 mL/hr over 30 Minutes Intravenous  Once 11/20/22 2022 11/20/22 2329   11/20/22 2030  metroNIDAZOLE (FLAGYL) IVPB 500 mg        500 mg 100 mL/hr over 60 Minutes Intravenous  Once 11/20/22 2022 11/20/22 2329   11/20/22 2030  vancomycin (VANCOCIN) IVPB 1000 mg/200 mL premix  Status:  Discontinued        1,000 mg 200 mL/hr over 60 Minutes Intravenous  Once 11/20/22 2022 11/20/22 2024   11/20/22 2030  vancomycin (VANCOREADY) IVPB 2000 mg/400 mL        2,000 mg 200 mL/hr over 120 Minutes Intravenous  Once 11/20/22 2024 11/21/22 0044       Skin assessment:      Diet:  Diet Order             DIET - DYS 1 Room service appropriate? Yes with Assist; Fluid consistency: Thin  Diet effective now                   Nutritional status:  Body mass index is 34.5 kg/m.  Nutrition Problem: Inadequate oral intake Etiology: inability to eat Signs/Symptoms: NPO status      Objective: Vitals:   12/05/22 0517 12/05/22 0749  BP: (!) 151/81 (!) 155/83  Pulse: (!) 58 (!) 56  Resp: 16 17  Temp: 98.7 F (37.1 C) 98.3 F (36.8 C)  SpO2: 98% 98%    Intake/Output  Summary (Last 24 hours) at 12/05/2022 1024 Last data filed at 12/05/2022 0900  Gross per 24 hour  Intake 1430 ml  Output 1250 ml  Net 180 ml    Filed Weights   12/02/22 0532 12/04/22 0452 12/05/22 0517  Weight: 118.4 kg 119.2 kg 121.9 kg   Weight change: 2.7 kg Body mass index is 34.5 kg/m.   Physical Exam: General exam: Pleasant, elderly Caucasian male.  Propped up in bed.  Not in distress Skin: No rashes, lesions or ulcers. HEENT: Atraumatic, normocephalic, no obvious bleeding.  Lungs: Clear to auscultation bilaterally CVS: Regular rate and rhythm, no murmur GI/Abd soft, distended from obesity, right upper quadrant tenderness improved., bowel sound present CNS: Alert, awake, oriented to place and person.  Continues to have left-sided deficits. Psychiatry: Mood appropriate Extremities: No pedal edema.  No tenderness  Data Review: I have personally reviewed the laboratory data and studies available.  F/u labs ordered Unresulted Labs (From admission, onward)    None       Total time spent in review of labs and imaging, patient evaluation, formulation of plan, documentation and communication with family: 25 minutes  Signed, Terrilee Croak, MD [facility] 12/05/2022

## 2022-12-06 DIAGNOSIS — K219 Gastro-esophageal reflux disease without esophagitis: Secondary | ICD-10-CM | POA: Diagnosis not present

## 2022-12-06 DIAGNOSIS — N179 Acute kidney failure, unspecified: Secondary | ICD-10-CM | POA: Diagnosis not present

## 2022-12-06 DIAGNOSIS — I4891 Unspecified atrial fibrillation: Secondary | ICD-10-CM | POA: Diagnosis not present

## 2022-12-06 DIAGNOSIS — K8010 Calculus of gallbladder with chronic cholecystitis without obstruction: Secondary | ICD-10-CM | POA: Diagnosis not present

## 2022-12-06 DIAGNOSIS — E785 Hyperlipidemia, unspecified: Secondary | ICD-10-CM | POA: Diagnosis not present

## 2022-12-06 DIAGNOSIS — M6259 Muscle wasting and atrophy, not elsewhere classified, multiple sites: Secondary | ICD-10-CM | POA: Diagnosis not present

## 2022-12-06 DIAGNOSIS — E875 Hyperkalemia: Secondary | ICD-10-CM | POA: Diagnosis not present

## 2022-12-06 DIAGNOSIS — I10 Essential (primary) hypertension: Secondary | ICD-10-CM | POA: Diagnosis not present

## 2022-12-06 DIAGNOSIS — G8110 Spastic hemiplegia affecting unspecified side: Secondary | ICD-10-CM | POA: Diagnosis not present

## 2022-12-06 DIAGNOSIS — I6930 Unspecified sequelae of cerebral infarction: Secondary | ICD-10-CM | POA: Diagnosis not present

## 2022-12-06 DIAGNOSIS — R6520 Severe sepsis without septic shock: Secondary | ICD-10-CM | POA: Diagnosis not present

## 2022-12-06 DIAGNOSIS — E1165 Type 2 diabetes mellitus with hyperglycemia: Secondary | ICD-10-CM | POA: Diagnosis not present

## 2022-12-06 DIAGNOSIS — I69354 Hemiplegia and hemiparesis following cerebral infarction affecting left non-dominant side: Secondary | ICD-10-CM | POA: Diagnosis not present

## 2022-12-06 DIAGNOSIS — G459 Transient cerebral ischemic attack, unspecified: Secondary | ICD-10-CM | POA: Diagnosis not present

## 2022-12-06 DIAGNOSIS — E1110 Type 2 diabetes mellitus with ketoacidosis without coma: Secondary | ICD-10-CM | POA: Diagnosis not present

## 2022-12-06 DIAGNOSIS — Z7401 Bed confinement status: Secondary | ICD-10-CM | POA: Diagnosis not present

## 2022-12-06 DIAGNOSIS — R278 Other lack of coordination: Secondary | ICD-10-CM | POA: Diagnosis not present

## 2022-12-06 DIAGNOSIS — M6281 Muscle weakness (generalized): Secondary | ICD-10-CM | POA: Diagnosis not present

## 2022-12-06 LAB — GLUCOSE, CAPILLARY
Glucose-Capillary: 50 mg/dL — ABNORMAL LOW (ref 70–99)
Glucose-Capillary: 84 mg/dL (ref 70–99)
Glucose-Capillary: 122 mg/dL — ABNORMAL HIGH (ref 70–99)

## 2022-12-06 MED ORDER — APIXABAN 5 MG PO TABS: 5.0000 mg | ORAL_TABLET | Freq: Two times a day (BID) | ORAL | Status: DC

## 2022-12-06 MED ORDER — INSULIN ASPART 100 UNIT/ML FLEXPEN: 0.0000 [IU] | PEN_INJECTOR | Freq: Three times a day (TID) | SUBCUTANEOUS | 0 refills | Status: AC

## 2022-12-06 MED ORDER — INSULIN GLARGINE 100 UNIT/ML SOLOSTAR PEN: 45.0000 [IU] | PEN_INJECTOR | Freq: Two times a day (BID) | SUBCUTANEOUS | 0 refills | Status: AC

## 2022-12-06 MED ORDER — INSULIN GLARGINE-YFGN 100 UNIT/ML ~~LOC~~ SOLN
35.0000 [IU] | Freq: Every day | SUBCUTANEOUS | Status: DC
  Filled 2022-12-06: qty 0.35

## 2022-12-06 MED ORDER — INSULIN GLARGINE-YFGN 100 UNIT/ML ~~LOC~~ SOLN
45.0000 [IU] | Freq: Every day | SUBCUTANEOUS | Status: DC
  Administered 2022-12-06: 45 [IU] via SUBCUTANEOUS
  Filled 2022-12-06: qty 0.45

## 2022-12-06 MED ORDER — INSULIN GLARGINE-YFGN 100 UNIT/ML ~~LOC~~ SOLN
40.0000 [IU] | Freq: Two times a day (BID) | SUBCUTANEOUS | Status: DC
  Filled 2022-12-06 (×2): qty 0.4

## 2022-12-06 MED ORDER — GLUCAGON EMERGENCY 1 MG IJ KIT: 1.0000 mg | PACK | Freq: Once | INTRAMUSCULAR | 0 refills | Status: AC | PRN

## 2022-12-06 MED ORDER — PEN NEEDLES 31G X 5 MM MISC: 1.0000 | Freq: Three times a day (TID) | 0 refills | Status: AC

## 2022-12-06 MED ORDER — POLYETHYLENE GLYCOL 3350 17 G PO PACK: 17.0000 g | PACK | Freq: Every day | ORAL | 0 refills | Status: AC | PRN

## 2022-12-06 MED ORDER — ADULT MULTIVITAMIN W/MINERALS CH: 1.0000 | ORAL_TABLET | Freq: Every day | ORAL | Status: DC

## 2022-12-06 MED ORDER — LOSARTAN POTASSIUM 50 MG PO TABS: 50.0000 mg | ORAL_TABLET | Freq: Every day | ORAL | Status: DC

## 2022-12-06 MED ORDER — AMLODIPINE BESYLATE 10 MG PO TABS: 10.0000 mg | ORAL_TABLET | Freq: Every day | ORAL | Status: DC

## 2022-12-06 MED ORDER — SENNOSIDES-DOCUSATE SODIUM 8.6-50 MG PO TABS: 1.0000 | ORAL_TABLET | Freq: Every day | ORAL | Status: DC

## 2022-12-06 MED ORDER — FAMOTIDINE 20 MG PO TABS: 20.0000 mg | ORAL_TABLET | Freq: Two times a day (BID) | ORAL | Status: DC

## 2022-12-06 NOTE — Discharge Summary (Signed)
Physician Discharge Summary  [name] [name]. RDE:[id number] DOB: 03-Mar-1961 DOA: 11/20/2022  PCP: [name] Hughs, NP  Admit date: 11/20/2022 Discharge date: 12/06/2022  Admitted From: Home Discharge disposition: SNF  Brief narrative: [name] Tissue. is a 62 y.o. male with PMH significant for DM2, HTN, HLD, previous right CVA with left-sided deficits, dysphagia, falls who lives at home and uses walker at baseline. 1/13, patient was brought to the ED from home after he was found on the floor.  Last seen normal was 2 days prior.  Per family, patient had been lethargic for about a week.  They believe it was after he started having diarrhea due to laxative that he uses for chronic constipation.  1/9, patient had a fall at home. Per EMS, at the scene, blood pressure was low at 90/56  In the ED, patient was in A-fib with RVR at 140s, initial blood pressure was low at 78/54 Labs with glucose level 1100, anion gap 29, pH low at 7.3, serum bicarb 14, beta-hydroxybutyrate level elevated to more than 8.  WBC count 16, lactic acid 3.2, sodium level elevated to 153, potassium elevated to 6.9, creatinine elevated 5.5 Patient was started on IV fluid, IV insulin drip per DKA protocol Chest x-ray unremarkable Admitted to ICU Blood culture sent on admission showed multi species growth including Proteus Mirabella's, Staph epidermidis and lactobacillus species. Urine culture showed 50,000 CFU per mL of E. coli 1/19, transferred out to [facility] See below for details  Subjective: Patient was seen and examined this morning.  Not in distress.  Propped up in bed.  No new symptoms. Blood sugar low this morning.  Insulin doses adjusted.  Hospital course: Severe sepsis - POA Multi species bacteremia and UTI On admission, patient elevated WBC count, elevated lactic acid and low blood pressure.  Met criteria for severe sepsis.   Blood culture sent on admission grew multiple species including Proteus mirabilis,  Staph epidermidis.  Unclear significance.  Urine culture grew 2000 CFU per mL of E. coli, unclear significance.   Repeat blood culture sent on 1/14 did not show any growth. In any case, he improved with IV Ancef and completed 10-day course on 1/23. Recent Labs  Lab 12/01/22 0435 12/02/22 0833 12/03/22 0339  WBC 8.8 6.4 5.7   Type 2 diabetes mellitus uncontrolled  DKA Initial labs as above showed DKA Improved with IV insulin drip, IV fluid and electrolyte management. Subsequently switched to subcu insulin. A1c uncontrolled at 13.5 on 11/21/2022 PTA not on meds. Currently his insulin dose is being adjusted based on blood sugar trend. Will discharge on Lantus 45 units twice daily and sliding scale insulin. Diabetes care coordinator consult appreciated. Recent Labs  Lab 12/05/22 1605 12/05/22 2024 12/06/22 0818 12/06/22 0937 12/06/22 1129  GLUCAP 152* 214* 50* 84 122*   Acute cholecystitis and cholelithiasis Acute biliary pancreatitis Initial lipase level elevated to more than 1000.   1/14, CT abdomen showed acute interstitial pancreatitis with minimal nonlocalizing peripancreatic fluid, without abscess or free air.  Also showed cholelithiasis without acute cholecystitis 1/19, right upper quadrant ultrasound showed gallstones and acute cholecystitis General surgery consult obtained.   1/23, patient underwent lap chole with intraoperative cholangiogram. Postop course uneventful.  Able to tolerate diet.  AKI Initial creatinine was elevated to 5.5 due to sepsis, DKA, pancreatitis Creatinine gradually improved to normal.  Currently not on IV fluid. Recent Labs    11/23/22 0455 11/23/22 1510 11/24/22 0327 11/25/22 0526 11/26/22 0515 11/27/22 0250 11/28/22  5638 11/29/22 0336 12/01/22 0435 12/02/22 0833  BUN 63* 54* 36* 33* 31* 32* 24* '16 19 14  '$ CREATININE 2.03* 1.85* 1.28* 1.45* 1.24 1.36* 1.11 0.88 1.02 0.84   A-fib with RVR New onset A-fib in the setting of sepsis,  hypovolemia PTA not on any AV node blocking agent. Currently in normal sinus rhythm.  CHADSVASC score elevated to 5 (HTN, HLD, DM2, stroke).  Started on on Eliquis.    Essential hypertension Currently blood pressure is controlled on amlodipine 10 mg daily, losartan 50 mg daily.  Acute encephalopathy Impaired mentation likely due to combination of sepsis, DKA, pancreatitis and in the background of history of stroke Initial CT head did not show any acute intracranial abnormality Continue to monitor mental status change  Recent right CVA with left-sided deficits and dysphagia HLD CT scan of head 1/13 showed a large remote right MCA distribution infarct with additional remote lacunar infarct at the left basal ganglia. Patient states, he was taking aspirin 81 mg daily and Lipitor 40 mg daily.  Since has been started on anticoagulation, no need to resume aspirin. Okay to resume Lipitor at discharge.  Dysphagia While in ICU, patient was started on core track feeding.  Patient pulled it out on 1/20. Currently on dysphagia 1 diet. Speech therapy follow-up post discharge.  Prolonged qtc Initial EKG with QTc prolonged to 509 ms probably because of RVR and abnormal electrolytes  Repeat EKG 1/19 showed QTc 426 ms.  GERD Continue PPI  Mild acute anemia Hb >10. No active bleeding.  Postop hemoglobin slightly low today.  No active bleeding.  Continue to monitor.   Recent Labs    11/28/22 0442 11/29/22 0336 12/01/22 0435 12/02/22 0833 12/03/22 0339  HGB 9.3* 10.5* 9.8* 10.5* 10.1*  MCV 94.1 90.1 92.1 89.7 91.1   Major depressive disorder PTA on fluoxetine.    Impaired mobility Has baseline left-sided deficits after the stroke.  Current acute weakness related to acute comorbidities and prolonged hospitalization. PT eval recommended SNF   Goals of care   Code Status: Full Code   Wounds:  - Incision (Closed) 11/30/22 Abdomen Other (Comment) (Active)  Date First Assessed/Time  First Assessed: 11/30/22 1140   Location: Abdomen  Location Orientation: Other (Comment)    Assessments 11/30/2022 12:45 PM 12/06/2022  9:00 AM  Dressing Type Liquid skin adhesive Liquid skin adhesive  Dressing Clean, Dry, Intact Clean, Dry, Intact  Closure -- Skin glue  Drainage Amount -- None     No associated orders.     Incision - 4 Ports Abdomen Umbilicus Upper;Mid Right;Medial Right;Lateral (Active)  Placement Date/Time: 11/30/22 1135   Location of Ports: Abdomen  Location Orientation: Umbilicus  Location Orientation: Upper;Mid  Location Orientation: Right;Medial  Location Orientation: Right;Lateral    Assessments 11/30/2022 12:45 PM 12/06/2022  9:00 AM  Port 1 Site Assessment Clean;Dry --  Port 1 Margins Attached edges (approximated) --  Port 1 Drainage Amount None --  Port 1 Dressing Type Liquid skin adhesive Liquid skin adhesive  Port 1 Dressing Status Clean, Dry, Intact --  Port 2 Site Assessment Clean;Dry Clean;Dry  Port 2 Margins Attached edges (approximated) Attached edges (approximated)  Port 2 Drainage Amount None None  Port 2 Dressing Type Liquid skin adhesive Liquid skin adhesive  Port 2 Dressing Status Clean, Dry, Intact --  Port 3 Site Assessment Clean;Dry Clean;Dry  Port 3 Margins Attached edges (approximated) Attached edges (approximated)  Port 3 Drainage Amount None None  Port 3 Dressing Type Liquid skin adhesive Liquid  skin adhesive  Port 3 Dressing Status Clean, Dry, Intact Clean, Dry, Intact  Port 4 Site Assessment Clean;Dry Clean;Dry  Port 4 Margins Attached edges (approximated) Attached edges (approximated)  Port 4 Drainage Amount None None  Port 4 Dressing Type Liquid skin adhesive Liquid skin adhesive  Port 4 Dressing Status Clean, Dry, Intact Clean, Dry, Intact     No associated orders.    Discharge Exam:   Vitals:   12/05/22 0749 12/05/22 1614 12/05/22 2021 12/06/22 0736  BP: (!) 155/83 136/81 138/87 136/62  Pulse: (!) 56 63 67 (!) 57  Resp: '17  18 20 16  '$ Temp: 98.3 F (36.8 C) 97.8 F (36.6 C) 98.8 F (37.1 C) 98.9 F (37.2 C)  TempSrc: Oral  Oral Oral  SpO2: 98% 95% 99% 94%  Weight:        Body mass index is 34.5 kg/m.  General exam: Pleasant, elderly Caucasian male.  Propped up in bed.  Not in distress Skin: No rashes, lesions or ulcers. HEENT: Atraumatic, normocephalic, no obvious bleeding.  Lungs: Clear to auscultation bilaterally CVS: Regular rate and rhythm, no murmur GI/Abd soft, distended from obesity, right upper quadrant tenderness improved, bowel sound present CNS: Alert, awake, oriented to place and person.  Continues to have left-sided deficits. Psychiatry: Mood appropriate Extremities: No pedal edema.  No tenderness  Follow ups:    Contact information for follow-up providers     [facility] Go on 12/21/2022.   Specialty: General Surgery Why: Your appointment is 12/21/22 at 9:45 am Arrive 86mn early to check in, fill out paperwork, Bring photo ID and insurance information Contact information: [phone]       Ngetich, DNelda Bucks NP Follow up.   Specialty: Family Medicine Contact information: [phone]             Contact information for after-discharge care     [email or web address]   Service: Skilled Nursing Contact information: [phone]                    Discharge Instructions:   Discharge Instructions     Call MD for:  difficulty breathing, headache or visual disturbances   Complete by: As directed    Call MD for:  extreme fatigue   Complete by: As directed    Call MD for:  hives   Complete by: As directed    Call MD for:  persistant dizziness or light-headedness   Complete by: As directed    Call MD for:  persistant nausea and vomiting   Complete by: As  directed    Call MD for:  severe uncontrolled pain   Complete by: As directed    Call MD for:  temperature >100.4   Complete by: As directed    Diet - low sodium heart healthy   Complete by: As directed    Diet Carb Modified   Complete by: As directed    Discharge instructions   Complete by: As directed    Discharge instructions for diabetes mellitus: Check blood sugar 3 times a day and bedtime at home. If blood sugar running above 200 or less than 70 please call your MD to adjust insulin. If you notice signs and symptoms of hypoglycemia (low blood sugar) like jitteriness, confusion, thirst, tremor and sweating,  please check blood sugar, drink sugary drink/biscuits/sweets to increase sugar level and call MD or return to ER.    General discharge instructions: Follow with Primary MD Ngetich, Nelda Bucks, NP in 7 days  Please request your PCP  to go over your hospital tests, procedures, radiology results at the follow up. Please get your medicines reviewed and adjusted.  Your PCP may decide to repeat certain labs or tests as needed. Do not drive, operate heavy machinery, perform activities at heights, swimming or participation in water activities or provide baby sitting services if your were admitted for syncope or siezures until you have seen by Primary MD or a Neurologist and advised to do so again. [place] Controlled Substance Reporting System database was reviewed. Do not drive, operate heavy machinery, perform activities at heights, swim, participate in water activities or provide baby-sitting services while on medications for pain, sleep and mood until your outpatient physician has reevaluated you and advised to do so again.  You are strongly recommended to comply with the dose, frequency and duration of prescribed medications. Activity: As tolerated with Full fall precautions use walker/cane & assistance as needed Avoid using any recreational substances like cigarette, tobacco,  alcohol, or non-prescribed drug. If you experience worsening of your admission symptoms, develop shortness of breath, life threatening emergency, suicidal or homicidal thoughts you must seek medical attention immediately by calling 911 or calling your MD immediately  if symptoms less severe. You must read complete instructions/literature along with all the possible adverse reactions/side effects for all the medicines you take and that have been prescribed to you. Take any new medicine only after you have completely understood and accepted all the possible adverse reactions/side effects.  Wear Seat belts while driving. You were cared for by a hospitalist during your hospital stay. If you have any questions about your discharge medications or the care you received while you were in the hospital after you are discharged, you can call the unit and ask to speak with the hospitalist or the covering physician. Once you are discharged, your primary care physician will handle any further medical issues. Please note that NO REFILLS for any discharge medications will be authorized once you are discharged, as it is imperative that you return to your primary care physician (or establish a relationship with a primary care physician if you do not have one).   Increase activity slowly   Complete by: As directed        Discharge Medications:   Allergies as of 12/06/2022   No Known Allergies      Medication List     STOP taking these medications    aspirin 325 MG tablet   cloNIDine 0.1 MG tablet Commonly known as: CATAPRES   FLUoxetine 10 MG capsule Commonly known as: PROZAC   hydrALAZINE 25 MG tablet Commonly known as: APRESOLINE   ibuprofen 800 MG tablet Commonly known as: ADVIL   meclizine 25 MG tablet Commonly known as: ANTIVERT   pantoprazole 40 MG tablet Commonly known as: PROTONIX       TAKE these medications    amLODipine 10 MG tablet Commonly known as: NORVASC Take 1 tablet  (10 mg total) by mouth daily. Start taking on: December 07, 2022   apixaban 5 MG Tabs tablet Commonly known as: ELIQUIS Take 1 tablet (5 mg total) by mouth 2 (two) times daily.   atorvastatin 80 MG tablet Commonly known as: LIPITOR TAKE 1/2 TABLET BY MOUTH EVERY DAY   famotidine 20 MG  tablet Commonly known as: PEPCID Take 1 tablet (20 mg total) by mouth 2 (two) times daily.   Glucagon Emergency 1 MG Kit Inject 1 mg into the skin once as needed for up to 1 dose (low blood sugar).   insulin aspart 100 UNIT/ML FlexPen Commonly known as: NOVOLOG Inject 0-11 Units into the skin 3 (three) times daily with meals. Check sugar and inject per scale: BG <150= 0 unit; BG 150-200= 1 unit; BG 201-250= 3 unit; BG 251-300= 5 unit; BG 301-350= 7 unit; BG 351-400= 9 unit; BG >400= 11 unit and Call Provider.   insulin glargine 100 UNIT/ML Solostar Pen Commonly known as: LANTUS Inject 45 Units into the skin 2 (two) times daily. May substitute as needed per insurance. 45 units in a.m. and 35 units in p.m.   losartan 50 MG tablet Commonly known as: COZAAR Take 1 tablet (50 mg total) by mouth daily. Start taking on: December 07, 2022   multivitamin with minerals Tabs tablet Take 1 tablet by mouth daily. Start taking on: December 07, 2022   Pen Needles 31G X 5 MM Misc 1 each by Does not apply route 3 (three) times daily. May dispense any manufacturer covered by patient's insurance.   polyethylene glycol 17 g packet Commonly known as: MIRALAX / GLYCOLAX Take 17 g by mouth daily as needed for moderate constipation.   senna-docusate 8.6-50 MG tablet Commonly known as: Senokot-S Take 1 tablet by mouth daily.         The results of significant diagnostics from this hospitalization (including imaging, microbiology, ancillary and laboratory) are listed below for reference.    Procedures and Diagnostic Studies:   DG Abd 1 View  Result Date: 11/21/2022 CLINICAL DATA:  Enteric tube placement  EXAM: ABDOMEN - 1 VIEW COMPARISON:  CT abdomen/pelvis from earlier today FINDINGS: Enteric tube terminates in the proximal body of the stomach. No dilated small bowel loops or evidence of pneumoperitoneum in the limited visualized upper abdomen. Mild bibasilar atelectasis. IMPRESSION: Enteric tube terminates in the proximal body of the stomach. Electronically Signed   By: Ilona Sorrel M.D.   On: 11/21/2022 17:48   CT Cervical Spine Wo Contrast  Result Date: 11/21/2022 CLINICAL DATA:  Polytrauma, abdominal pain, unable to raise the arms above the head. EXAM: CT CERVICAL SPINE WITHOUT CONTRAST CT ABDOMEN AND PELVIS WITHOUT CONTRAST TECHNIQUE: Contiguous axial images were obtained from the base of the skull through the vertex without intravenous contrast. Multidetector CT imaging of the cervical spine was performed without intravenous contrast. Multiplanar CT image reconstructions were also generated. Multidetector CT imaging of the abdomen and pelvis was performed following the standard protocol without IV contrast. RADIATION DOSE REDUCTION: This exam was performed according to the departmental dose-optimization program which includes automated exposure control, adjustment of the mA and/or kV according to patient size and/or use of iterative reconstruction technique. COMPARISON:  None Available. FINDINGS: CT CERVICAL SPINE FINDINGS Alignment: There is a slight cervical dextroscoliosis and a straightened lordosis. No AP listhesis. No widening of the anterior atlantodental joint. Narrowing and osteophytes are noted of the anterior atlantodental joint with C1 normally positioned on C2. Skull base and vertebrae: No fracture or focal bone lesion is seen. The bone mineralization is normal. Soft tissues and spinal canal: No prevertebral fluid or swelling. No visible canal hematoma. There is a 2 cm hypodense mass in the inferior pole right lobe of the thyroid gland. Nonemergent ultrasound follow-up is recommended. Trace  calcification both carotid bifurcations. Disc levels: There  is mild disc space loss at all cervical levels. Anterior bridging osteophytes C2-3 are noted. There are prominent nonbridging anterior osteophytes C3-4 through C6-7. There are mild posterior disc osteophyte complexes C3-4 through C6-7, but due to congenitally short pedicles there is underlying spinal canal stenosis particularly at C4-5 with disc osteophyte complexes deforming the ventral cord surface at all 4 levels. There is mild facet joint and uncinate hypertrophy. Degenerative foraminal stenosis is moderate on the left at C2-3 and C3-4, bilaterally mild at C4-5, mild on the right at C5-6. Other foramina are clear. Upper chest: Negative. Other: None. CT ABDOMEN AND PELVIS FINDINGS Lower chest: Eventration and asymmetric elevation right hemidiaphragm. There is mosaic attenuation in the lower lobes consistent with air trapping and small airways disease with bronchial thickening in both lower lobes. No consolidated pneumonia is seen. There is mild cardiomegaly. No pericardial effusion. Hepatobiliary: 18.6 cm in length liver. Suboptimal image detail due to breathing motion and streak artifact from the patient's arms in the field. No obvious mass. There are small stones in gallbladder but no wall thickening or biliary dilatation. Pancreas: There is pancreatic as well as mild peripancreatic edema, findings consistent with acute interstitial pancreatitis. No mass is seen without contrast, no pancreatic abscess or peripancreatic free air. There is no ductal dilatation. Spleen: No abnormality is seen through the streak artifacts and breathing motion. No splenomegaly. Adrenals/Urinary Tract: There is a 1.8 cm left adrenal nodule, with Hounsfield density of 31 units, indeterminate. There is no right adrenal mass. The renal cortex is unremarkable without contrast. There is no urinary stone or obstruction. Bladder is contracted around a Foley balloon body from so  does appear thickened with perivesical stranding. Correlate clinically for cystitis. Stomach/Bowel: There are thickened folds in the duodenum. This is probably reactive due to the pancreatitis although could indicate coexisting duodenitis. Remainder of the unopacified small bowel is unremarkable. The appendix is normal. There is a wide mouth transmesenteric internal hernia of the ascending mesocolon, nonobstructing and with multiple small bowel segments extending lateral to the ascending colon. No large bowel thickening or inflammatory changes are seen. Vascular/Lymphatic: Aortic atherosclerosis. No enlarged abdominal or pelvic lymph nodes. Reproductive: Enlarged prostate 5.5 cm transverse. Other: There are small umbilical and inguinal fat hernias. There is no incarcerated hernia. There is no free air, free hemorrhage or substantial free ascites. There is trace nonlocalizing peripancreatic fluid Musculoskeletal: There are degenerative changes of the lower thoracic spine and lumbar spine, most advanced degenerative change at L3-4 where there is acquired spinal canal stenosis. There is multilevel lumbar degenerative foraminal stenosis. Bridging osteophytes anterior right SI joint. IMPRESSION: 1. Slight cervical scoliosis without evidence of fractures or listhesis. 2. Cervical degenerative changes with multilevel spinal canal and foraminal stenosis. 3. CT findings of acute interstitial pancreatitis with minimal nonlocalizing peripancreatic fluid. No pancreatic abscess or free air. Adjacent duodenitis versus reactive duodenal wall thickening. 4. Cholelithiasis without evidence of acute cholecystitis. 5. 1.8 cm indeterminate left adrenal nodule. Follow-up as indicated. 6. Cystitis versus bladder nondistention with Foley catheter in place. 7. Aortic atherosclerosis. 8. Cardiomegaly. 9. Air trapping and small airways disease in the lower lobes. 10. Wide mouth transmesenteric internal hernia of the ascending mesocolon,  nonobstructing, with multiple small bowel segments extending lateral to the ascending colon. 11. Prostatomegaly. 12. Umbilical and inguinal fat hernias. 13. Degenerative changes of the spine without evidence of fractures. 14. 2 cm hypodense mass inferior pole right lobe of thyroid gland. Nonemergent ultrasound follow-up recommended. Aortic Atherosclerosis (ICD10-I70.0). Electronically Signed  By: Telford Nab M.D.   On: 11/21/2022 03:11   CT ABDOMEN PELVIS WO CONTRAST  Result Date: 11/21/2022 CLINICAL DATA:  Polytrauma, abdominal pain, unable to raise the arms above the head. EXAM: CT CERVICAL SPINE WITHOUT CONTRAST CT ABDOMEN AND PELVIS WITHOUT CONTRAST TECHNIQUE: Contiguous axial images were obtained from the base of the skull through the vertex without intravenous contrast. Multidetector CT imaging of the cervical spine was performed without intravenous contrast. Multiplanar CT image reconstructions were also generated. Multidetector CT imaging of the abdomen and pelvis was performed following the standard protocol without IV contrast. RADIATION DOSE REDUCTION: This exam was performed according to the departmental dose-optimization program which includes automated exposure control, adjustment of the mA and/or kV according to patient size and/or use of iterative reconstruction technique. COMPARISON:  None Available. FINDINGS: CT CERVICAL SPINE FINDINGS Alignment: There is a slight cervical dextroscoliosis and a straightened lordosis. No AP listhesis. No widening of the anterior atlantodental joint. Narrowing and osteophytes are noted of the anterior atlantodental joint with C1 normally positioned on C2. Skull base and vertebrae: No fracture or focal bone lesion is seen. The bone mineralization is normal. Soft tissues and spinal canal: No prevertebral fluid or swelling. No visible canal hematoma. There is a 2 cm hypodense mass in the inferior pole right lobe of the thyroid gland. Nonemergent ultrasound  follow-up is recommended. Trace calcification both carotid bifurcations. Disc levels: There is mild disc space loss at all cervical levels. Anterior bridging osteophytes C2-3 are noted. There are prominent nonbridging anterior osteophytes C3-4 through C6-7. There are mild posterior disc osteophyte complexes C3-4 through C6-7, but due to congenitally short pedicles there is underlying spinal canal stenosis particularly at C4-5 with disc osteophyte complexes deforming the ventral cord surface at all 4 levels. There is mild facet joint and uncinate hypertrophy. Degenerative foraminal stenosis is moderate on the left at C2-3 and C3-4, bilaterally mild at C4-5, mild on the right at C5-6. Other foramina are clear. Upper chest: Negative. Other: None. CT ABDOMEN AND PELVIS FINDINGS Lower chest: Eventration and asymmetric elevation right hemidiaphragm. There is mosaic attenuation in the lower lobes consistent with air trapping and small airways disease with bronchial thickening in both lower lobes. No consolidated pneumonia is seen. There is mild cardiomegaly. No pericardial effusion. Hepatobiliary: 18.6 cm in length liver. Suboptimal image detail due to breathing motion and streak artifact from the patient's arms in the field. No obvious mass. There are small stones in gallbladder but no wall thickening or biliary dilatation. Pancreas: There is pancreatic as well as mild peripancreatic edema, findings consistent with acute interstitial pancreatitis. No mass is seen without contrast, no pancreatic abscess or peripancreatic free air. There is no ductal dilatation. Spleen: No abnormality is seen through the streak artifacts and breathing motion. No splenomegaly. Adrenals/Urinary Tract: There is a 1.8 cm left adrenal nodule, with Hounsfield density of 31 units, indeterminate. There is no right adrenal mass. The renal cortex is unremarkable without contrast. There is no urinary stone or obstruction. Bladder is contracted around  a Foley balloon body from so does appear thickened with perivesical stranding. Correlate clinically for cystitis. Stomach/Bowel: There are thickened folds in the duodenum. This is probably reactive due to the pancreatitis although could indicate coexisting duodenitis. Remainder of the unopacified small bowel is unremarkable. The appendix is normal. There is a wide mouth transmesenteric internal hernia of the ascending mesocolon, nonobstructing and with multiple small bowel segments extending lateral to the ascending colon. No large bowel thickening or  inflammatory changes are seen. Vascular/Lymphatic: Aortic atherosclerosis. No enlarged abdominal or pelvic lymph nodes. Reproductive: Enlarged prostate 5.5 cm transverse. Other: There are small umbilical and inguinal fat hernias. There is no incarcerated hernia. There is no free air, free hemorrhage or substantial free ascites. There is trace nonlocalizing peripancreatic fluid Musculoskeletal: There are degenerative changes of the lower thoracic spine and lumbar spine, most advanced degenerative change at L3-4 where there is acquired spinal canal stenosis. There is multilevel lumbar degenerative foraminal stenosis. Bridging osteophytes anterior right SI joint. IMPRESSION: 1. Slight cervical scoliosis without evidence of fractures or listhesis. 2. Cervical degenerative changes with multilevel spinal canal and foraminal stenosis. 3. CT findings of acute interstitial pancreatitis with minimal nonlocalizing peripancreatic fluid. No pancreatic abscess or free air. Adjacent duodenitis versus reactive duodenal wall thickening. 4. Cholelithiasis without evidence of acute cholecystitis. 5. 1.8 cm indeterminate left adrenal nodule. Follow-up as indicated. 6. Cystitis versus bladder nondistention with Foley catheter in place. 7. Aortic atherosclerosis. 8. Cardiomegaly. 9. Air trapping and small airways disease in the lower lobes. 10. Wide mouth transmesenteric internal hernia of  the ascending mesocolon, nonobstructing, with multiple small bowel segments extending lateral to the ascending colon. 11. Prostatomegaly. 12. Umbilical and inguinal fat hernias. 13. Degenerative changes of the spine without evidence of fractures. 14. 2 cm hypodense mass inferior pole right lobe of thyroid gland. Nonemergent ultrasound follow-up recommended. Aortic Atherosclerosis (ICD10-I70.0). Electronically Signed   By: Telford Nab M.D.   On: 11/21/2022 03:11   CT Head Wo Contrast  Result Date: 11/20/2022 CLINICAL DATA:  Initial evaluation for neuro deficit, stroke suspected. EXAM: CT HEAD WITHOUT CONTRAST TECHNIQUE: Contiguous axial images were obtained from the base of the skull through the vertex without intravenous contrast. RADIATION DOSE REDUCTION: This exam was performed according to the departmental dose-optimization program which includes automated exposure control, adjustment of the mA and/or kV according to patient size and/or use of iterative reconstruction technique. COMPARISON:  Prior study from 10/28/2022. FINDINGS: Brain: Age-related cerebral atrophy. Large remote right MCA distribution infarct with extensive encephalomalacia throughout the right cerebral hemisphere. Remote lacunar infarct present at the left basal ganglia. No acute intracranial hemorrhage. No acute large vessel territory infarct. No mass lesion or significant midline shift. Ex vacuo dilatation of the right lateral ventricle related to the chronic right cerebral encephalomalacia. No hydrocephalus. No extra-axial fluid collection. Vascular: Vascular stent in place within the right M1 segment. No new hyperdense vessel. Skull: Scalp soft tissues and calvarium demonstrate no acute finding. Sinuses/Orbits: Globes and orbital soft tissues within normal limits. Paranasal sinuses are largely clear. No mastoid effusion. Other: None. IMPRESSION: 1. No acute intracranial abnormality. 2. Large remote right MCA distribution infarct with  additional remote lacunar infarct at the left basal ganglia. 3. Vascular stent in place within the right M1 segment. Electronically Signed   By: Jeannine Boga M.D.   On: 11/20/2022 21:20   DG Chest Port 1 View  Result Date: 11/20/2022 CLINICAL DATA:  Questionable sepsis - evaluate for abnormality EXAM: PORTABLE CHEST 1 VIEW COMPARISON:  October 28, 2022, August 08, 2015 FINDINGS: Evaluation is limited by positioning. The cardiomediastinal silhouette is unchanged in contour.Unchanged elevation of the RIGHT hemidiaphragm. No pleural effusion. No pneumothorax. Asymmetric density at the RIGHT apex, favored due to vascular shadow from patient positioning. IMPRESSION: Asymmetric density at the RIGHT apex, favored due to vascular shadow from patient positioning. Recommend improved evaluation with dedicated PA and lateral chest radiograph. Otherwise no acute cardiopulmonary abnormality. Electronically Signed   By: Colletta Maryland  Peacock M.D.   On: 11/20/2022 20:51     Labs:   Basic Metabolic Panel: Recent Labs  Lab 12/01/22 0435 12/02/22 0833  NA 139 140  K 4.4 3.6  CL 105 109  CO2 26 25  GLUCOSE 316* 219*  BUN 19 14  CREATININE 1.02 0.84  CALCIUM 7.9* 8.5*  MG 1.7  --   PHOS 3.4  --    GFR Estimated Creatinine Clearance: 123.3 mL/min (by C-G formula based on SCr of 0.84 mg/dL). Liver Function Tests: Recent Labs  Lab 12/01/22 0435 12/02/22 0833  AST 56* 40  ALT 27 30  ALKPHOS 207* 208*  BILITOT 0.3 0.4  PROT 5.9* 6.2*  ALBUMIN 1.7* 1.9*   Recent Labs  Lab 12/01/22 0435  LIPASE 74*   No results for input(s): "AMMONIA" in the last 168 hours. Coagulation profile No results for input(s): "INR", "PROTIME" in the last 168 hours.  CBC: Recent Labs  Lab 12/01/22 0435 12/02/22 0833 12/03/22 0339  WBC 8.8 6.4 5.7  NEUTROABS 6.6  --  3.5  HGB 9.8* 10.5* 10.1*  HCT 31.3* 32.2* 31.7*  MCV 92.1 89.7 91.1  PLT 191 225 222   Cardiac Enzymes: No results for input(s):  "CKTOTAL", "CKMB", "CKMBINDEX", "TROPONINI" in the last 168 hours. BNP: Invalid input(s): "POCBNP" CBG: Recent Labs  Lab 12/05/22 1605 12/05/22 2024 12/06/22 0818 12/06/22 0937 12/06/22 1129  GLUCAP 152* 214* 50* 84 122*   D-Dimer No results for input(s): "DDIMER" in the last 72 hours. Hgb A1c No results for input(s): "HGBA1C" in the last 72 hours. Lipid Profile No results for input(s): "CHOL", "HDL", "LDLCALC", "TRIG", "CHOLHDL", "LDLDIRECT" in the last 72 hours. Thyroid function studies No results for input(s): "TSH", "T4TOTAL", "T3FREE", "THYROIDAB" in the last 72 hours.  Invalid input(s): "FREET3" Anemia work up No results for input(s): "VITAMINB12", "FOLATE", "FERRITIN", "TIBC", "IRON", "RETICCTPCT" in the last 72 hours. Microbiology No results found for this or any previous visit (from the past 240 hour(s)).  Time coordinating discharge: 35 minutes  Signed: Anabia Weatherwax  [facility] 12/06/2022, 12:02 PM

## 2022-12-06 NOTE — Plan of Care (Signed)
Problem: Safety: Goal: Non-violent Restraint(s) Outcome: Adequate for Discharge   Problem: Education: Goal: Ability to describe self-care measures that may prevent or decrease complications (Diabetes Survival Skills Education) will improve Outcome: Adequate for Discharge Goal: Individualized Educational Video(s) Outcome: Adequate for Discharge   Problem: Coping: Goal: Ability to adjust to condition or change in health will improve Outcome: Adequate for Discharge   Problem: Fluid Volume: Goal: Ability to maintain a balanced intake and output will improve Outcome: Adequate for Discharge   Problem: Health Behavior/Discharge Planning: Goal: Ability to identify and utilize available resources and services will improve Outcome: Adequate for Discharge Goal: Ability to manage health-related needs will improve Outcome: Adequate for Discharge   Problem: Metabolic: Goal: Ability to maintain appropriate glucose levels will improve Outcome: Adequate for Discharge   Problem: Nutritional: Goal: Maintenance of adequate nutrition will improve Outcome: Adequate for Discharge Goal: Progress toward achieving an optimal weight will improve Outcome: Adequate for Discharge   Problem: Skin Integrity: Goal: Risk for impaired skin integrity will decrease Outcome: Adequate for Discharge   Problem: Tissue Perfusion: Goal: Adequacy of tissue perfusion will improve Outcome: Adequate for Discharge   Problem: Education: Goal: Ability to describe self-care measures that may prevent or decrease complications (Diabetes Survival Skills Education) will improve Outcome: Adequate for Discharge Goal: Individualized Educational Video(s) Outcome: Adequate for Discharge   Problem: Cardiac: Goal: Ability to maintain an adequate cardiac output will improve Outcome: Adequate for Discharge   Problem: Health Behavior/Discharge Planning: Goal: Ability to identify and utilize available resources and services  will improve Outcome: Adequate for Discharge Goal: Ability to manage health-related needs will improve Outcome: Adequate for Discharge   Problem: Fluid Volume: Goal: Ability to achieve a balanced intake and output will improve Outcome: Adequate for Discharge   Problem: Metabolic: Goal: Ability to maintain appropriate glucose levels will improve Outcome: Adequate for Discharge   Problem: Nutritional: Goal: Maintenance of adequate nutrition will improve Outcome: Adequate for Discharge Goal: Maintenance of adequate weight for body size and type will improve Outcome: Adequate for Discharge   Problem: Respiratory: Goal: Will regain and/or maintain adequate ventilation Outcome: Adequate for Discharge   Problem: Urinary Elimination: Goal: Ability to achieve and maintain adequate renal perfusion and functioning will improve Outcome: Adequate for Discharge   Problem: Education: Goal: Knowledge of General Education information will improve Description: Including pain rating scale, medication(s)/side effects and non-pharmacologic comfort measures Outcome: Adequate for Discharge   Problem: Health Behavior/Discharge Planning: Goal: Ability to manage health-related needs will improve Outcome: Adequate for Discharge   Problem: Clinical Measurements: Goal: Ability to maintain clinical measurements within normal limits will improve Outcome: Adequate for Discharge Goal: Will remain free from infection Outcome: Adequate for Discharge Goal: Diagnostic test results will improve Outcome: Adequate for Discharge Goal: Respiratory complications will improve Outcome: Adequate for Discharge Goal: Cardiovascular complication will be avoided Outcome: Adequate for Discharge   Problem: Activity: Goal: Risk for activity intolerance will decrease Outcome: Adequate for Discharge   Problem: Nutrition: Goal: Adequate nutrition will be maintained Outcome: Adequate for Discharge   Problem:  Coping: Goal: Level of anxiety will decrease Outcome: Adequate for Discharge   Problem: Elimination: Goal: Will not experience complications related to bowel motility Outcome: Adequate for Discharge Goal: Will not experience complications related to urinary retention Outcome: Adequate for Discharge   Problem: Pain Managment: Goal: General experience of comfort will improve Outcome: Adequate for Discharge   Problem: Safety: Goal: Ability to remain free from injury will improve Outcome: Adequate for Discharge  Problem: Skin Integrity: Goal: Risk for impaired skin integrity will decrease Outcome: Adequate for Discharge

## 2022-12-06 NOTE — Progress Notes (Signed)
Patients Blood sugar was 50 asymptomatic was given his breakfast and orange juice will recheck.

## 2022-12-06 NOTE — Progress Notes (Signed)
Speech Language Pathology Treatment: Dysphagia  Patient Details Name: [name] [name]. MRN: [id number] DOB: 1961/02/11 Today's Date: 12/06/2022 Time: 7517-0017 SLP Time Calculation (min) (ACUTE ONLY): 18 min  Assessment / Plan / Recommendation Clinical Impression  Pt seen for dysphagia f/u tx session with upgraded consistency and min verbal cues provided for bite size d/t min impulsivity.  Pt currently on D1(puree)/thin liquids with hx of CVA noted in chart.  Pt consumed puree and cracker with thin liquids via cup without overt s/sx of aspiration present.  Adequate mastication and no evidence of pocketing seen during snack.  Recommend initiate Dysphagia 3/thin liquid diet with general swallowing precautions and small bites/sips initiated d/t min impulsivity noted during PO intake.  ST will s/o in this setting as pt has met goals and is an imminent d/c per nursing report.    HPI HPI: 62 yo male admitted 1/13 after found on the floor at home. Dx acute respiratory failure, severe sepsis, acute encephalopathy, acute pancreatitis. Pt with DKA, hypothermic, hypotensive, AFib with RVR. PMhx: right CVA with lt hemiplegia, DM, vascular dementia, HTN, HLD. Remote hx of dysphagia after stroke in 2016. ST f/u for diet progression as pt able to progress to solids.      SLP Plan  Discharge SLP treatment due to (comment)      Recommendations for follow up therapy are one component of a multi-disciplinary discharge planning process, led by the attending physician.  Recommendations may be updated based on patient status, additional functional criteria and insurance authorization.    Recommendations  Diet recommendations: Dysphagia 3 (mechanical soft);Thin liquid Liquids provided via: Cup;Straw Medication Administration: Whole meds with puree Supervision: Patient able to self feed Compensations: Slow rate;Small sips/bites Postural Changes and/or Swallow Maneuvers: Seated upright 90 degrees                 Oral Care Recommendations: Oral care BID;Patient independent with oral care Follow Up Recommendations: No SLP follow up Assistance recommended at discharge: Intermittent Supervision/Assistance SLP Visit Diagnosis: Dysphagia, unspecified (R13.10) Plan: Discharge SLP treatment due to (comment)           [name],M.S., CCC-SLP  12/06/2022, 1:48 PM

## 2022-12-06 NOTE — TOC Transition Note (Signed)
Transition of Care [facility]) - CM/SW Discharge Note   Patient Details  Name: [name] [name]. MRN: [id number] Date of Birth: November 02, 1961  [facility]SW Contact:  Coralee Pesa, [place] Phone Number: 12/06/2022, 12:25 PM   Clinical Narrative:    Pt to be transported to [facility] via [place]. Nurse to call report to [phone].   Final next level of care: [place] Barriers to Discharge: Barriers Resolved   Patient Goals and CMS Choice   Choice offered to / list presented to : [place] / [place]  Discharge Placement                Patient chooses bed at:  [facility]) Patient to be transferred to facility by: [place] Name of family member notified: [name] Patient and family notified of of transfer: 12/06/22  Discharge Plan and Services Additional resources added to the After Visit Summary for                                       Social Determinants of Health (SDOH) Interventions SDOH Screenings   Food Insecurity: No Food Insecurity (01/11/2018)  Transportation Needs: No Transportation Needs (01/11/2018)  Depression (PHQ2-9): Low Risk  (06/17/2022)  Financial Resource Strain: Low Risk  (01/11/2018)  Physical Activity: Inactive (01/11/2018)  Social Connections: Moderately Isolated (01/11/2018)  Stress: No Stress Concern Present (01/11/2018)  Tobacco Use: Medium Risk (12/01/2022)     Readmission Risk Interventions     No data to display

## 2022-12-06 NOTE — Progress Notes (Signed)
Report was called to [facility] and given to [place], LPN She is aware that he will be there shortly and does have left sided weakness and Incont bowel and bladder. Currently on a Dys diet

## 2022-12-06 NOTE — Progress Notes (Signed)
Blood sugar is currently 84

## 2022-12-08 DIAGNOSIS — E1165 Type 2 diabetes mellitus with hyperglycemia: Secondary | ICD-10-CM | POA: Diagnosis not present

## 2022-12-08 DIAGNOSIS — E785 Hyperlipidemia, unspecified: Secondary | ICD-10-CM | POA: Diagnosis not present

## 2022-12-08 DIAGNOSIS — K219 Gastro-esophageal reflux disease without esophagitis: Secondary | ICD-10-CM | POA: Diagnosis not present

## 2022-12-08 DIAGNOSIS — I10 Essential (primary) hypertension: Secondary | ICD-10-CM | POA: Diagnosis not present

## 2022-12-09 DIAGNOSIS — R5381 Other malaise: Secondary | ICD-10-CM | POA: Diagnosis not present

## 2022-12-09 DIAGNOSIS — E785 Hyperlipidemia, unspecified: Secondary | ICD-10-CM | POA: Diagnosis not present

## 2022-12-09 DIAGNOSIS — I4891 Unspecified atrial fibrillation: Secondary | ICD-10-CM | POA: Diagnosis not present

## 2022-12-09 DIAGNOSIS — K219 Gastro-esophageal reflux disease without esophagitis: Secondary | ICD-10-CM | POA: Diagnosis not present

## 2022-12-09 DIAGNOSIS — I10 Essential (primary) hypertension: Secondary | ICD-10-CM | POA: Diagnosis not present

## 2022-12-09 DIAGNOSIS — R296 Repeated falls: Secondary | ICD-10-CM | POA: Diagnosis not present

## 2022-12-09 DIAGNOSIS — E1165 Type 2 diabetes mellitus with hyperglycemia: Secondary | ICD-10-CM | POA: Diagnosis not present

## 2022-12-09 DIAGNOSIS — K5909 Other constipation: Secondary | ICD-10-CM | POA: Diagnosis not present

## 2022-12-15 DIAGNOSIS — I4891 Unspecified atrial fibrillation: Secondary | ICD-10-CM | POA: Diagnosis not present

## 2022-12-15 DIAGNOSIS — K219 Gastro-esophageal reflux disease without esophagitis: Secondary | ICD-10-CM | POA: Diagnosis not present

## 2022-12-15 DIAGNOSIS — E785 Hyperlipidemia, unspecified: Secondary | ICD-10-CM | POA: Diagnosis not present

## 2022-12-15 DIAGNOSIS — R5381 Other malaise: Secondary | ICD-10-CM | POA: Diagnosis not present

## 2022-12-15 DIAGNOSIS — E1165 Type 2 diabetes mellitus with hyperglycemia: Secondary | ICD-10-CM | POA: Diagnosis not present

## 2022-12-15 DIAGNOSIS — I10 Essential (primary) hypertension: Secondary | ICD-10-CM | POA: Diagnosis not present

## 2022-12-15 DIAGNOSIS — I6930 Unspecified sequelae of cerebral infarction: Secondary | ICD-10-CM | POA: Diagnosis not present

## 2022-12-20 ENCOUNTER — Ambulatory Visit: Payer: Medicare (Managed Care) | Admitting: Family

## 2022-12-22 DIAGNOSIS — K219 Gastro-esophageal reflux disease without esophagitis: Secondary | ICD-10-CM | POA: Diagnosis not present

## 2022-12-22 DIAGNOSIS — I4891 Unspecified atrial fibrillation: Secondary | ICD-10-CM | POA: Diagnosis not present

## 2022-12-22 DIAGNOSIS — I10 Essential (primary) hypertension: Secondary | ICD-10-CM | POA: Diagnosis not present

## 2022-12-22 DIAGNOSIS — E785 Hyperlipidemia, unspecified: Secondary | ICD-10-CM | POA: Diagnosis not present

## 2022-12-22 DIAGNOSIS — I6930 Unspecified sequelae of cerebral infarction: Secondary | ICD-10-CM | POA: Diagnosis not present

## 2022-12-22 DIAGNOSIS — R5381 Other malaise: Secondary | ICD-10-CM | POA: Diagnosis not present

## 2022-12-22 DIAGNOSIS — E1165 Type 2 diabetes mellitus with hyperglycemia: Secondary | ICD-10-CM | POA: Diagnosis not present

## 2022-12-29 DIAGNOSIS — I6930 Unspecified sequelae of cerebral infarction: Secondary | ICD-10-CM | POA: Diagnosis not present

## 2022-12-29 DIAGNOSIS — E1165 Type 2 diabetes mellitus with hyperglycemia: Secondary | ICD-10-CM | POA: Diagnosis not present

## 2022-12-29 DIAGNOSIS — K219 Gastro-esophageal reflux disease without esophagitis: Secondary | ICD-10-CM | POA: Diagnosis not present

## 2022-12-29 DIAGNOSIS — R5381 Other malaise: Secondary | ICD-10-CM | POA: Diagnosis not present

## 2022-12-29 DIAGNOSIS — I4891 Unspecified atrial fibrillation: Secondary | ICD-10-CM | POA: Diagnosis not present

## 2022-12-29 DIAGNOSIS — E785 Hyperlipidemia, unspecified: Secondary | ICD-10-CM | POA: Diagnosis not present

## 2022-12-29 DIAGNOSIS — I10 Essential (primary) hypertension: Secondary | ICD-10-CM | POA: Diagnosis not present

## 2023-01-12 DIAGNOSIS — I6930 Unspecified sequelae of cerebral infarction: Secondary | ICD-10-CM | POA: Diagnosis not present

## 2023-01-12 DIAGNOSIS — R5381 Other malaise: Secondary | ICD-10-CM | POA: Diagnosis not present

## 2023-01-12 DIAGNOSIS — E785 Hyperlipidemia, unspecified: Secondary | ICD-10-CM | POA: Diagnosis not present

## 2023-01-12 DIAGNOSIS — I10 Essential (primary) hypertension: Secondary | ICD-10-CM | POA: Diagnosis not present

## 2023-01-12 DIAGNOSIS — E1165 Type 2 diabetes mellitus with hyperglycemia: Secondary | ICD-10-CM | POA: Diagnosis not present

## 2023-01-13 DIAGNOSIS — I10 Essential (primary) hypertension: Secondary | ICD-10-CM | POA: Diagnosis not present

## 2023-01-13 DIAGNOSIS — I6930 Unspecified sequelae of cerebral infarction: Secondary | ICD-10-CM | POA: Diagnosis not present

## 2023-01-13 DIAGNOSIS — E1165 Type 2 diabetes mellitus with hyperglycemia: Secondary | ICD-10-CM | POA: Diagnosis not present

## 2023-01-13 DIAGNOSIS — E785 Hyperlipidemia, unspecified: Secondary | ICD-10-CM | POA: Diagnosis not present

## 2023-01-13 DIAGNOSIS — R5381 Other malaise: Secondary | ICD-10-CM | POA: Diagnosis not present

## 2023-01-13 DIAGNOSIS — K219 Gastro-esophageal reflux disease without esophagitis: Secondary | ICD-10-CM | POA: Diagnosis not present

## 2023-01-19 ENCOUNTER — Encounter: Payer: Self-pay | Admitting: Family

## 2023-01-19 ENCOUNTER — Ambulatory Visit (INDEPENDENT_AMBULATORY_CARE_PROVIDER_SITE_OTHER): Payer: Medicare HMO | Admitting: Family

## 2023-01-19 VITALS — BP 120/80 | HR 73 | Temp 97.4°F | Resp 18 | Ht 74.0 in | Wt 259.1 lb

## 2023-01-19 DIAGNOSIS — E785 Hyperlipidemia, unspecified: Secondary | ICD-10-CM

## 2023-01-19 DIAGNOSIS — F321 Major depressive disorder, single episode, moderate: Secondary | ICD-10-CM | POA: Diagnosis not present

## 2023-01-19 DIAGNOSIS — K219 Gastro-esophageal reflux disease without esophagitis: Secondary | ICD-10-CM

## 2023-01-19 DIAGNOSIS — E1169 Type 2 diabetes mellitus with other specified complication: Secondary | ICD-10-CM | POA: Diagnosis not present

## 2023-01-19 DIAGNOSIS — E119 Type 2 diabetes mellitus without complications: Secondary | ICD-10-CM | POA: Diagnosis not present

## 2023-01-19 DIAGNOSIS — I6930 Unspecified sequelae of cerebral infarction: Secondary | ICD-10-CM

## 2023-01-19 DIAGNOSIS — Z9049 Acquired absence of other specified parts of digestive tract: Secondary | ICD-10-CM

## 2023-01-19 DIAGNOSIS — I4891 Unspecified atrial fibrillation: Secondary | ICD-10-CM

## 2023-01-19 DIAGNOSIS — F0150 Vascular dementia without behavioral disturbance: Secondary | ICD-10-CM

## 2023-01-19 DIAGNOSIS — I10 Essential (primary) hypertension: Secondary | ICD-10-CM

## 2023-01-19 MED ORDER — CONTINUOUS GLUCOSE MONITOR SUP KIT: 1.0000 | PACK | Freq: Four times a day (QID) | 5 refills | Status: AC

## 2023-01-19 MED ORDER — LANCETS MISC: 1.0000 | Freq: Three times a day (TID) | 11 refills | Status: AC

## 2023-01-19 NOTE — Progress Notes (Signed)
Provider: Marlowe Sax FNP-C  Agastya Meister, Nelda Bucks, NP  Patient Care Team: Nicole Defino, Nelda Bucks, NP as PCP - General (Family [facility]) [facility], [place] ([place])  Extended Emergency Contact Information Primary Emergency Contact: Woodhams,Yavonda Mobile Phone: [phone] Relation: Daughter Secondary Emergency Contact: [name],Nicole Address: [address]          [place], [place] [address] Johnnette Litter of [place] Mobile Phone: [phone] Relation: Daughter  Code Status: Full Code  Goals of care: Advanced Directive information    11/04/2022    8:45 AM  Advanced Directives  Does Patient Have a Medical Advance Directive? No  Would patient like information on creating a medical advance directive? No - Patient declined     Chief Complaint  Patient presents with   Follow-up    Rehab follow-up and foot exam.  Discuss need for covid boosters, diabetic kidney evaluation, eye exam, shingrix, flu vaccine and AWV or post pone if patient refuses or is not a candidate.     HPI:  Pt is a 62 y.o. male seen today for an acute visit for post hospitalization 11/20/2022  - 12/06/2022 after he was found on the floor.He was last seen by family 2 days ago prior to fall.thought from weakness due to diarrhea after use of laxative for chronic constipation.  In the ED he was in Afib with RVR at 140's with low B/p 78/54,glucose level 1100,WBC 16,lactic acid 3.2 ,Na + 153,K+ 6.9 and CR 5.5.IVF and insulin drip was given for DKA.CXR was normal.He was admitted to ICU.Blood CX showed multiple proteus mirabella's staph and lactobacillus.urine also showed E.coli 50,000 colonies.He was treated with I.V Ancef.  His hospital stay was complicated by acute cholecystitis and cholelithiasis acute biliary pancreatitis.Lipase > 1000 CT Abdomen done 11/21/2022 showed acute interstitial pancreatitis and cholelithiasis without acute cholecystitis but right upper quadrant ultrasound showed gallstones and  acute cholecystitis.General surgery was consulted and he underwent lab chole with intraoperative cholangiogram on 11/30/2022.He recovered well post-op.   He was started on Eliquis for new onset Afib with RVR.Aspirin was discontinued.Head CT scan showed a large remote right MCA distribution infarct with additional remote lacunar infarct at the basal ganglia.Lipitor was continued.  Blood sugars improved and was switched to SQ insulin.He was discharged SNF on Lantus and SSI.  He denies any acute issues.Here with  daughter who states patient currently living with her. Daughter states was discharge to [facility].No discharge notes from skilled for review. States has physical Therapy once per week.   Daughter request refills on glucometer supplies states was given one from the facility which she started using on 01/17/2023 but does not have enough lancet and strips.patient unable to check blood sugars by himself.He would benefit from use of Dexa comb sensor. CBG runs in the 80's -170's with few readings in the 200's after eating white rice.    Past Medical History:  Diagnosis Date   Acute respiratory failure (HCC)    Cerebral edema (HCC)    Diabetes mellitus without complication ([place])    diet controlled- no meds   Dysphagia as late effect of cerebrovascular disease    Fall at home    Hemiparesis affecting left side as late effect of stroke ([place])    Hyperlipidemia    Hypertension    Stroke ([place]) 2016   Tuberculosis    11 yrs ago per pt.- treated with meds per pt.   Past Surgical History:  Procedure Laterality Date   CHOLECYSTECTOMY N/A 11/30/2022  Procedure: LAPAROSCOPIC CHOLECYSTECTOMY WITH INTRAOPERATIVE CHOLANGIOGRAM;  Surgeon: Erroll Luna, MD;  Location: [facility] OR;  Service: General;  Laterality: N/A;   COLONOSCOPY  10/06/2016   ESOPHAGOGASTRODUODENOSCOPY N/A 08/12/2015   Procedure: ESOPHAGOGASTRODUODENOSCOPY (EGD);  Surgeon: Ladene Artist, MD;  Location[facility] ENDOSCOPY;  Service:  Endoscopy;  Laterality: N/A;   feeding tube removed     GASTROSTOMY TUBE PLACEMENT  05-19-15   mechanical thrombectomy angioplasty stenet placement     SKIN GRAFT  1980's   ankle   UPPER GASTROINTESTINAL ENDOSCOPY      No Known Allergies  Outpatient Encounter Medications as of 01/19/2023  Medication Sig   amLODipine (NORVASC) 10 MG tablet Take 1 tablet (10 mg total) by mouth daily.   apixaban (ELIQUIS) 5 MG TABS tablet Take 1 tablet (5 mg total) by mouth 2 (two) times daily.   atorvastatin (LIPITOR) 80 MG tablet TAKE 1/2 TABLET BY MOUTH EVERY DAY   famotidine (PEPCID) 20 MG tablet Take 1 tablet (20 mg total) by mouth 2 (two) times daily.   Glucagon, rDNA, (GLUCAGON EMERGENCY) 1 MG KIT Inject 1 mg into the skin once as needed for up to 1 dose (low blood sugar).   insulin aspart (NOVOLOG) 100 UNIT/ML FlexPen Inject 0-11 Units into the skin 3 (three) times daily with meals. Check sugar and inject per scale: BG <150= 0 unit; BG 150-200= 1 unit; BG 201-250= 3 unit; BG 251-300= 5 unit; BG 301-350= 7 unit; BG 351-400= 9 unit; BG >400= 11 unit and Call Provider.   insulin glargine (LANTUS) 100 UNIT/ML Solostar Pen Inject 45 Units into the skin 2 (two) times daily. May substitute as needed per insurance. 45 units in a.m. and 35 units in p.m.   Insulin Pen Needle (PEN NEEDLES) 31G X 5 MM MISC 1 each by Does not apply route 3 (three) times daily. May dispense any manufacturer covered by patient's insurance.   losartan (COZAAR) 50 MG tablet Take 1 tablet (50 mg total) by mouth daily.   polyethylene glycol (MIRALAX / GLYCOLAX) 17 g packet Take 17 g by mouth daily as needed for moderate constipation.   [DISCONTINUED] Multiple Vitamin (MULTIVITAMIN WITH MINERALS) TABS tablet Take 1 tablet by mouth daily.   [DISCONTINUED] senna-docusate (SENOKOT-S) 8.6-50 MG tablet Take 1 tablet by mouth daily.   No facility-administered encounter medications on file as of 01/19/2023.    Review of Systems   Constitutional:  Negative for appetite change, chills, fatigue, fever and unexpected weight change.  HENT:  Negative for congestion, dental problem, ear discharge, ear pain, facial swelling, hearing loss, nosebleeds, postnasal drip, rhinorrhea, sinus pressure, sinus pain, sneezing, sore throat, tinnitus and trouble swallowing.   Eyes:  Negative for pain, discharge, redness, itching and visual disturbance.  Respiratory:  Negative for cough, chest tightness, shortness of breath and wheezing.   Cardiovascular:  Negative for chest pain, palpitations and leg swelling.  Gastrointestinal:  Negative for abdominal distention, abdominal pain, blood in stool, constipation, diarrhea, nausea and vomiting.  Endocrine: Negative for cold intolerance, heat intolerance, polydipsia, polyphagia and polyuria.  Genitourinary:  Negative for difficulty urinating, dysuria, flank pain, frequency and urgency.  Musculoskeletal:  Positive for gait problem. Negative for arthralgias, back pain, joint swelling, myalgias, neck pain and neck stiffness.  Skin:  Negative for color change, pallor, rash and wound.  Neurological:  Negative for dizziness, syncope, speech difficulty, weakness, light-headedness, numbness and headaches.       Left side weakness   Hematological:  Does not bruise/bleed easily.  Psychiatric/Behavioral:  Negative for agitation, behavioral problems, confusion, hallucinations, self-injury, sleep disturbance and suicidal ideas. The patient is not nervous/anxious.     Immunization History  Administered Date(s) Administered   Fluad Quad(high Dose 65+) 12/17/2021   Influenza,inj,Quad PF,6+ Mos 08/10/2015, 09/22/2016, 07/29/2017, 11/27/2018, 09/28/2019, 12/08/2020   Pneumococcal Conjugate-13 01/16/2019   Pneumococcal Polysaccharide-23 12/10/2016   Td 01/16/2019   Tdap 01/16/2019   Zoster Recombinat (Shingrix) 12/17/2021   Pertinent  Health Maintenance Due  Topic Date Due   OPHTHALMOLOGY EXAM  07/28/2021    FOOT EXAM  08/08/2021   INFLUENZA VACCINE  02/06/2023 (Originally 06/08/2022)   COLONOSCOPY (Pts 45-29yr Insurance coverage will need to be confirmed)  03/24/2023   HEMOGLOBIN A1C  05/22/2023      11/04/2022    8:45 AM 11/21/2022    5:43 AM 11/21/2022    8:00 AM 11/21/2022    8:00 PM 01/19/2023    1:43 PM  FBeverly Hillsin the past year? 0    0  Was there an injury with Fall? 0    0  Fall Risk Category Calculator 0    0  Fall Risk Category (Retired) Low      (RETIRED) Patient Fall Risk Level Low fall risk Moderate fall risk High fall risk High fall risk   Patient at Risk for Falls Due to No Fall Risks    No Fall Risks  Fall risk Follow up Falls evaluation completed       Functional Status Survey:    Vitals:   01/19/23 1344  BP: 120/80  Pulse: 73  Resp: 18  Temp: (!) 97.4 F (36.3 C)  SpO2: 94%  Weight: 259 lb 2 oz (117.5 kg)  Height: '6\' 2"'$  (1.88 m)   Body mass index is 33.27 kg/m. Physical Exam Vitals reviewed.  Constitutional:      General: He is not in acute distress.    Appearance: Normal appearance. He is obese. He is not ill-appearing or diaphoretic.  HENT:     Head: Normocephalic.     Right Ear: Tympanic membrane, ear canal and external ear normal. There is no impacted cerumen.     Left Ear: Tympanic membrane, ear canal and external ear normal. There is no impacted cerumen.     Nose: Nose normal. No congestion or rhinorrhea.     Mouth/Throat:     Mouth: Mucous membranes are moist.     Pharynx: Oropharynx is clear. No oropharyngeal exudate or posterior oropharyngeal erythema.  Eyes:     General: No scleral icterus.       Right eye: No discharge.        Left eye: No discharge.     Extraocular Movements: Extraocular movements intact.     Conjunctiva/sclera: Conjunctivae normal.     Pupils: Pupils are equal, round, and reactive to light.  Neck:     Vascular: No carotid bruit.  Cardiovascular:     Rate and Rhythm: Normal rate and regular rhythm.     Pulses:  Normal pulses.     Heart sounds: Normal heart sounds. No murmur heard.    No friction rub. No gallop.  Pulmonary:     Effort: Pulmonary effort is normal. No respiratory distress.     Breath sounds: Normal breath sounds. No wheezing, rhonchi or rales.  Chest:     Chest wall: No tenderness.  Abdominal:     General: Bowel sounds are normal. There is no distension.     Palpations: Abdomen is soft. There is no  mass.     Tenderness: There is no abdominal tenderness. There is no right CVA tenderness, left CVA tenderness, guarding or rebound.  Musculoskeletal:        General: No swelling or tenderness. Normal range of motion.     Cervical back: Normal range of motion. No rigidity or tenderness.     Right lower leg: No edema.     Left lower leg: No edema.  Lymphadenopathy:     Cervical: No cervical adenopathy.  Skin:    General: Skin is warm and dry.     Coloration: Skin is not pale.     Findings: No bruising, erythema, lesion or rash.  Neurological:     Mental Status: He is alert and oriented to person, place, and time. Mental status is at baseline.     Cranial Nerves: No cranial nerve deficit.     Sensory: No sensory deficit.     Motor: No weakness.     Coordination: Coordination normal.     Gait: Gait abnormal.     Comments: Left hand contracture  Left side weakness   Psychiatric:        Mood and Affect: Mood normal.        Speech: Speech normal.        Behavior: Behavior normal.        Thought Content: Thought content normal.        Judgment: Judgment normal.     Labs reviewed: Recent Labs    11/24/22 0327 11/25/22 0526 11/28/22 0716 11/29/22 0336 12/01/22 0435 12/02/22 0833  NA 141   < > 149* 142 139 140  K 3.1*   < > 4.0 3.4* 4.4 3.6  CL 109   < > 110 109 105 109  CO2 24   < > '27 26 26 25  '$ GLUCOSE 379*   < > 109* 81 316* 219*  BUN 36*   < > 24* '16 19 14  '$ CREATININE 1.28*   < > 1.11 0.88 1.02 0.84  CALCIUM 6.8*   < > 8.4* 8.2* 7.9* 8.5*  MG 2.0  --  2.0  --  1.7   --   PHOS 2.0*  --  3.8  --  3.4  --    < > = values in this interval not displayed.   Recent Labs    11/29/22 0336 12/01/22 0435 12/02/22 0833  AST 49* 56* 40  ALT 32 27 30  ALKPHOS 248* 207* 208*  BILITOT 0.3 0.3 0.4  PROT 6.1* 5.9* 6.2*  ALBUMIN 1.7* 1.7* 1.9*   Recent Labs    11/27/22 0250 11/28/22 0442 12/01/22 0435 12/02/22 0833 12/03/22 0339  WBC 7.1   < > 8.8 6.4 5.7  NEUTROABS 4.1  --  6.6  --  3.5  HGB 10.5*   < > 9.8* 10.5* 10.1*  HCT 33.9*   < > 31.3* 32.2* 31.7*  MCV 92.9   < > 92.1 89.7 91.1  PLT 126*   < > 191 225 222   < > = values in this interval not displayed.   Lab Results  Component Value Date   TSH 0.957 11/21/2022   Lab Results  Component Value Date   HGBA1C 13.5 (H) 11/21/2022   Lab Results  Component Value Date   CHOL 159 06/21/2022   HDL 54 06/21/2022   LDLCALC 78 06/21/2022   TRIG 175 (H) 06/21/2022   CHOLHDL 2.9 06/21/2022    Significant Diagnostic Results in last 30 days:  No results found.  Assessment/Plan 1. Type 2 diabetes mellitus without complication, without long-term current use of insulin (HCC) Lab Results  Component Value Date   HGBA1C 13.5 (H) 11/21/2022  Post hospitalization CBG readings have improved  - CBC with Differential/Platelet - Basic metabolic panel - Lancets MISC; 1 Device by Does not apply route 3 (three) times daily before meals. Uses assure platinum glucometer E11.9  Dispense: 100 each; Refill: 11  2. Essential hypertension Blood pressure well controlled -Continue on losartan and amlodipine - CBC with Differential/Platelet - Basic metabolic panel - Ambulatory referral to Cardiology  3. Dyslipidemia associated with type 2 diabetes mellitus (HCC) Previous LDL at goal except triglycerides Continue on atorvastatin Dietary modification and exercise advised - Ambulatory referral to Cardiology  4. Gastroesophageal reflux disease without esophagitis Stable Continue on famotidine  5. Current  moderate episode of major depressive disorder, unspecified whether recurrent (HCC) Stable Continue to monitor  6. H/O: stroke with residual effects Head CT scan showed a large remote right MCA distribution infarct with additional remote lacunar infarct at the basal ganglia. - Ambulatory referral to Neurology  7. S/P laparoscopic cholecystectomy CT Abdomen done 11/21/2022 showed acute interstitial pancreatitis and cholelithiasis without acute cholecystitis but right upper quadrant ultrasound showed gallstones and acute cholecystitis.General surgery was consulted and he underwent lab chole with intraoperative cholangiogram on 11/30/2022 - continue to monitor.   8. Vascular dementia without behavioral disturbance ([place]) No behavioral issues reported  Now living with daughter - continue with supportive care   9. Morbid obesity (HCC) BMI 33.27 with associated comorbidity HTN,HLD,stroke and Type DM - dietary modification and exercise advised.used to [facility] exercise at the [facility] but unclear why he stopped.    10. New onset a-fib ([place]) new onset Afib with RVR.Aspirin was discontinued start on EliQuis 5  mg tablet Bid  - Ambulatory referral to Cardiology  Family/ staff Communication: Reviewed plan of care with patient and daughter verbalized understanding   Labs/tests ordered:  - CBC with Differential/Platelet - Basic metabolic panel  Next Appointment: Return in about 6 months (around 07/22/2023) for medical mangement of chronic issues., Fasting labs in 6 months prior to visit.   Sandrea Hughs, NP

## 2023-01-20 ENCOUNTER — Telehealth: Payer: Self-pay

## 2023-01-20 ENCOUNTER — Other Ambulatory Visit: Payer: Self-pay | Admitting: Family

## 2023-01-20 DIAGNOSIS — E119 Type 2 diabetes mellitus without complications: Secondary | ICD-10-CM

## 2023-01-20 LAB — BASIC METABOLIC PANEL
BUN: 21 mg/dL (ref 7–25)
CO2: 26 mmol/L (ref 20–32)
Calcium: 9.7 mg/dL (ref 8.6–10.3)
Chloride: 107 mmol/L (ref 98–110)
Creat: 1.17 mg/dL (ref 0.70–1.35)
Glucose, Bld: 104 mg/dL — ABNORMAL HIGH (ref 65–99)
Potassium: 4.1 mmol/L (ref 3.5–5.3)
Sodium: 144 mmol/L (ref 135–146)

## 2023-01-20 LAB — CBC WITH DIFFERENTIAL/PLATELET
Absolute Monocytes: 509 cells/uL (ref 200–950)
Basophils Absolute: 38 cells/uL (ref 0–200)
Basophils Relative: 0.8 %
Eosinophils Absolute: 149 cells/uL (ref 15–500)
Eosinophils Relative: 3.1 %
HCT: 38.5 % (ref 38.5–50.0)
Hemoglobin: 12.5 g/dL — ABNORMAL LOW (ref 13.2–17.1)
Lymphs Abs: 1867 cells/uL (ref 850–3900)
MCH: 28.7 pg (ref 27.0–33.0)
MCHC: 32.5 g/dL (ref 32.0–36.0)
MCV: 88.5 fL (ref 80.0–100.0)
MPV: 12.8 fL — ABNORMAL HIGH (ref 7.5–12.5)
Monocytes Relative: 10.6 %
Neutro Abs: 2237 cells/uL (ref 1500–7800)
Neutrophils Relative %: 46.6 %
Platelets: 171 10*3/uL (ref 140–400)
RBC: 4.35 10*6/uL (ref 4.20–5.80)
RDW: 15.2 % — ABNORMAL HIGH (ref 11.0–15.0)
Total Lymphocyte: 38.9 %
WBC: 4.8 10*3/uL (ref 3.8–10.8)

## 2023-01-20 MED ORDER — ACCU-CHEK SOFTCLIX LANCET DEV KIT: 1.0000 | PACK | Freq: Four times a day (QID) | 5 refills | Status: AC

## 2023-01-20 NOTE — Telephone Encounter (Signed)
Needs a separate prescription for lancets. They would not fill with what was sent. They have everything but the lancets. Please send lancets to pharmacy on file.

## 2023-01-20 NOTE — Telephone Encounter (Signed)
Lancet ordered.

## 2023-02-10 ENCOUNTER — Other Ambulatory Visit: Payer: Self-pay

## 2023-02-10 DIAGNOSIS — I10 Essential (primary) hypertension: Secondary | ICD-10-CM

## 2023-02-10 DIAGNOSIS — E1149 Type 2 diabetes mellitus with other diabetic neurological complication: Secondary | ICD-10-CM

## 2023-02-10 DIAGNOSIS — E1169 Type 2 diabetes mellitus with other specified complication: Secondary | ICD-10-CM

## 2023-02-10 MED ORDER — AMLODIPINE BESYLATE 10 MG PO TABS: 10.0000 mg | ORAL_TABLET | Freq: Every day | ORAL | Status: DC

## 2023-02-10 MED ORDER — APIXABAN 5 MG PO TABS: 5.0000 mg | ORAL_TABLET | Freq: Two times a day (BID) | ORAL | Status: DC

## 2023-02-10 MED ORDER — ATORVASTATIN CALCIUM 40 MG PO TABS: 40.0000 mg | ORAL_TABLET | Freq: Every day | ORAL | 1 refills | Status: DC

## 2023-02-10 MED ORDER — LOSARTAN POTASSIUM 50 MG PO TABS: 50.0000 mg | ORAL_TABLET | Freq: Every day | ORAL | Status: DC

## 2023-02-10 MED ORDER — FAMOTIDINE 20 MG PO TABS: 20.0000 mg | ORAL_TABLET | Freq: Two times a day (BID) | ORAL | Status: DC

## 2023-02-14 ENCOUNTER — Encounter: Payer: Self-pay | Admitting: Gastroenterology

## 2023-02-14 ENCOUNTER — Other Ambulatory Visit: Payer: Self-pay

## 2023-02-14 DIAGNOSIS — I10 Essential (primary) hypertension: Secondary | ICD-10-CM

## 2023-02-14 DIAGNOSIS — E1149 Type 2 diabetes mellitus with other diabetic neurological complication: Secondary | ICD-10-CM

## 2023-02-14 DIAGNOSIS — E1169 Type 2 diabetes mellitus with other specified complication: Secondary | ICD-10-CM

## 2023-02-14 MED ORDER — LOSARTAN POTASSIUM 50 MG PO TABS: 50.0000 mg | ORAL_TABLET | Freq: Every day | ORAL | 1 refills | Status: DC

## 2023-02-14 MED ORDER — APIXABAN 5 MG PO TABS: 5.0000 mg | ORAL_TABLET | Freq: Two times a day (BID) | ORAL | 1 refills | Status: DC

## 2023-02-14 MED ORDER — AMLODIPINE BESYLATE 10 MG PO TABS: 10.0000 mg | ORAL_TABLET | Freq: Every day | ORAL | 1 refills | Status: DC

## 2023-02-14 MED ORDER — FAMOTIDINE 20 MG PO TABS: 20.0000 mg | ORAL_TABLET | Freq: Two times a day (BID) | ORAL | 1 refills | Status: DC

## 2023-02-14 MED ORDER — ATORVASTATIN CALCIUM 40 MG PO TABS: 40.0000 mg | ORAL_TABLET | Freq: Every day | ORAL | 1 refills | Status: DC

## 2023-03-16 ENCOUNTER — Ambulatory Visit: Payer: Medicare (Managed Care) | Admitting: Podiatry

## 2023-03-22 ENCOUNTER — Telehealth: Payer: Self-pay

## 2023-03-22 NOTE — Telephone Encounter (Signed)
Outgoing call placed to patient informing him that he is overdue for a diabetic eye exam, patient stated " I am not getting anymore eye exams."   DM eye exam was postponed

## 2023-04-26 ENCOUNTER — Encounter: Payer: Self-pay | Admitting: Family

## 2023-05-03 ENCOUNTER — Encounter: Payer: Self-pay | Admitting: Family

## 2023-05-12 ENCOUNTER — Other Ambulatory Visit: Payer: Self-pay | Admitting: Family

## 2023-07-22 ENCOUNTER — Encounter: Payer: Self-pay | Admitting: Family

## 2023-07-22 ENCOUNTER — Ambulatory Visit (INDEPENDENT_AMBULATORY_CARE_PROVIDER_SITE_OTHER): Payer: Medicare (Managed Care) | Admitting: Family

## 2023-07-22 VITALS — BP 140/90 | HR 77 | Temp 97.7°F | Resp 16 | Ht 74.0 in | Wt 269.0 lb

## 2023-07-22 DIAGNOSIS — F03918 Unspecified dementia, unspecified severity, with other behavioral disturbance: Secondary | ICD-10-CM | POA: Diagnosis not present

## 2023-07-22 DIAGNOSIS — K219 Gastro-esophageal reflux disease without esophagitis: Secondary | ICD-10-CM | POA: Diagnosis not present

## 2023-07-22 DIAGNOSIS — Z1211 Encounter for screening for malignant neoplasm of colon: Secondary | ICD-10-CM

## 2023-07-22 DIAGNOSIS — I69354 Hemiplegia and hemiparesis following cerebral infarction affecting left non-dominant side: Secondary | ICD-10-CM | POA: Diagnosis not present

## 2023-07-22 DIAGNOSIS — E781 Pure hyperglyceridemia: Secondary | ICD-10-CM | POA: Diagnosis not present

## 2023-07-22 DIAGNOSIS — Z23 Encounter for immunization: Secondary | ICD-10-CM | POA: Diagnosis not present

## 2023-07-22 DIAGNOSIS — I10 Essential (primary) hypertension: Secondary | ICD-10-CM | POA: Diagnosis not present

## 2023-07-22 DIAGNOSIS — E66811 Obesity, class 1: Secondary | ICD-10-CM

## 2023-07-22 DIAGNOSIS — Z6834 Body mass index (BMI) 34.0-34.9, adult: Secondary | ICD-10-CM

## 2023-07-22 DIAGNOSIS — E119 Type 2 diabetes mellitus without complications: Secondary | ICD-10-CM

## 2023-07-22 DIAGNOSIS — E6609 Other obesity due to excess calories: Secondary | ICD-10-CM | POA: Diagnosis not present

## 2023-07-22 MED ORDER — APIXABAN 5 MG PO TABS: 5.0000 mg | ORAL_TABLET | Freq: Two times a day (BID) | ORAL | 1 refills | Status: DC

## 2023-07-22 MED ORDER — SEMAGLUTIDE(0.25 OR 0.5MG/DOS) 2 MG/3ML ~~LOC~~ SOPN
0.2500 mg | PEN_INJECTOR | SUBCUTANEOUS | 3 refills | Status: DC
Start: 2023-07-22 — End: 2023-08-22

## 2023-07-22 NOTE — Progress Notes (Unsigned)
Provider: [name]   Trumaine Wimer, Donalee Citrin, NP  Patient Care Team: Jay Kempe, Donalee Citrin, NP as PCP - General (Family Medicine) Center, [facility] (Skilled Nursing Facility) [facility]  Extended Emergency Contact Information Primary Emergency Contact: [name],Nicole Address: [address]          [place], [place] [address] [place] of [place] Phone: [phone] Relation: Daughter Secondary Emergency Contact: Benevides,Yavonda Mobile Phone: [phone] Relation: Daughter  Code Status:  Full Code  Goals of care: Advanced Directive information    07/22/2023   12:50 PM  Advanced Directives  Does Patient Have a Medical Advance Directive? No  Would patient like information on creating a medical advance directive? No - Patient declined     Chief Complaint  Patient presents with  . Follow-up    6 month follow up.medication refill on eliquis  . Immunizations    Patient is due for flu , shingles and covid vaccine     HPI:  Pt is a 62 y.o. male seen today for 6 months follow up for medical management of chronic diseases.    Type 2 DM - previous A1C 13.5 states CBG runs in the 110's-150's  Hypertension  -   Hyperlipidemia - TRG 175      Past Medical History:  Diagnosis Date  . Acute respiratory failure (HCC)   . Cerebral edema (HCC)   . Diabetes mellitus without complication (HCC)    diet controlled- no meds  . Dysphagia as late effect of cerebrovascular disease   . Fall at home   . Hemiparesis affecting left side as late effect of stroke (HCC)   . Hyperlipidemia   . Hypertension   . Stroke (HCC) 2016  . Tuberculosis    11 yrs ago per pt.- treated with meds per pt.   Past Surgical History:  Procedure Laterality Date  . CHOLECYSTECTOMY N/A 11/30/2022   Procedure: LAPAROSCOPIC CHOLECYSTECTOMY WITH INTRAOPERATIVE CHOLANGIOGRAM;  Surgeon: Harriette Bouillon, MD;  Location: [facility] OR;  Service: General;  Laterality: N/A;  .  COLONOSCOPY  10/06/2016  . ESOPHAGOGASTRODUODENOSCOPY N/A 08/12/2015   Procedure: ESOPHAGOGASTRODUODENOSCOPY (EGD);  Surgeon: [name] Dare, MD;  Location: [facility] ENDOSCOPY;  Service: Endoscopy;  Laterality: N/A;  . feeding tube removed    . GASTROSTOMY TUBE PLACEMENT  05-19-15  . mechanical thrombectomy angioplasty stenet placement    . SKIN GRAFT  1980's   ankle  . UPPER GASTROINTESTINAL ENDOSCOPY      No Known Allergies  Allergies as of 07/22/2023   No Known Allergies      Medication List        Accurate as of July 22, 2023  1:13 PM. If you have any questions, ask your nurse or doctor.          Accu-Chek Softclix Lancet Dev Kit 1 Device by Does not apply route in the morning, at noon, in the evening, and at bedtime.   amLODipine 10 MG tablet Commonly known as: NORVASC Take 1 tablet (10 mg total) by mouth daily.   apixaban 5 MG Tabs tablet Commonly known as: ELIQUIS Take 1 tablet (5 mg total) by mouth 2 (two) times daily.   atorvastatin 40 MG tablet Commonly known as: LIPITOR Take 1 tablet (40 mg total) by mouth daily.   Continuous Glucose Monitor Sup Kit 1 Device by Does not apply route in the morning, at noon, in the evening, and at bedtime.   famotidine 20 MG tablet Commonly known as: PEPCID TAKE  1 TABLET BY MOUTH TWICE A DAY   Glucagon Emergency 1 MG Kit Inject 1 mg into the skin once as needed for up to 1 dose (low blood sugar).   insulin aspart 100 UNIT/ML FlexPen Commonly known as: NOVOLOG Inject 0-11 Units into the skin 3 (three) times daily with meals. Check sugar and inject per scale: BG <150= 0 unit; BG 150-200= 1 unit; BG 201-250= 3 unit; BG 251-300= 5 unit; BG 301-350= 7 unit; BG 351-400= 9 unit; BG >400= 11 unit and Call Provider.   insulin glargine 100 UNIT/ML Solostar Pen Commonly known as: LANTUS Inject 45 Units into the skin 2 (two) times daily. May substitute as needed per insurance. 45 units in a.m. and 35 units in p.m.   Lancets  Misc 1 Device by Does not apply route 3 (three) times daily before meals. Uses assure platinum glucometer E11.9   losartan 50 MG tablet Commonly known as: COZAAR Take 1 tablet (50 mg total) by mouth daily.   Pen Needles 31G X 5 MM Misc 1 each by Does not apply route 3 (three) times daily. May dispense any manufacturer covered by patient's insurance.   polyethylene glycol 17 g packet Commonly known as: MIRALAX / GLYCOLAX Take 17 g by mouth daily as needed for moderate constipation.        Review of Systems  Constitutional:  Negative for appetite change, chills, fatigue, fever and unexpected weight change.  HENT:  Negative for congestion, dental problem, ear discharge, ear pain, facial swelling, hearing loss, nosebleeds, postnasal drip, rhinorrhea, sinus pressure, sinus pain, sneezing, sore throat, tinnitus and trouble swallowing.   Eyes:  Negative for pain, discharge, redness, itching and visual disturbance.  Respiratory:  Negative for cough, chest tightness, shortness of breath and wheezing.   Cardiovascular:  Negative for chest pain, palpitations and leg swelling.  Gastrointestinal:  Negative for abdominal distention, abdominal pain, blood in stool, constipation, diarrhea, nausea and vomiting.  Endocrine: Negative for cold intolerance, heat intolerance, polydipsia, polyphagia and polyuria.  Genitourinary:  Negative for difficulty urinating, dysuria, flank pain, frequency and urgency.  Musculoskeletal:  Negative for arthralgias, back pain, gait problem, joint swelling, myalgias, neck pain and neck stiffness.  Skin:  Negative for color change, pallor, rash and wound.  Neurological:  Negative for dizziness, syncope, speech difficulty, weakness, light-headedness, numbness and headaches.  Hematological:  Does not bruise/bleed easily.  Psychiatric/Behavioral:  Negative for agitation, behavioral problems, confusion, hallucinations, self-injury, sleep disturbance and suicidal ideas. The  patient is not nervous/anxious.     Immunization History  Administered Date(s) Administered  . Fluad Quad(high Dose 65+) 12/17/2021  . Influenza,inj,Quad PF,6+ Mos 08/10/2015, 09/22/2016, 07/29/2017, 11/27/2018, 09/28/2019, 12/08/2020  . Pneumococcal Conjugate-13 01/16/2019  . Pneumococcal Polysaccharide-23 12/10/2016  . Td 01/16/2019  . Tdap 01/16/2019  . Zoster Recombinant(Shingrix) 12/17/2021   Pertinent  Health Maintenance Due  Topic Date Due  . FOOT EXAM  08/08/2021  . Colonoscopy  03/24/2023  . HEMOGLOBIN A1C  05/22/2023  . INFLUENZA VACCINE  06/09/2023  . OPHTHALMOLOGY EXAM  11/08/2028 (Originally 07/28/2021)      11/21/2022    5:43 AM 11/21/2022    8:00 AM 11/21/2022    8:00 PM 01/19/2023    1:43 PM 07/22/2023   12:50 PM  Fall Risk  Falls in the past year?    0 0  Was there an injury with Fall?    0 0  Fall Risk Category Calculator    0 0  (RETIRED) Patient Fall Risk Level Moderate fall  risk High fall risk High fall risk    Patient at Risk for Falls Due to    No Fall Risks No Fall Risks  Fall risk Follow up     Falls evaluation completed   Functional Status Survey:    Vitals:   07/22/23 1251  BP: (!) 140/90  Pulse: 77  Resp: 16  Temp: 97.7 F (36.5 C)  TempSrc: Temporal  SpO2: 95%  Weight: 269 lb (122 kg)  Height: 6\' 2"  (1.88 m)   Body mass index is 34.54 kg/m. Physical Exam Musculoskeletal:     Comments: Left hand contracture     Labs reviewed: Recent Labs    11/24/22 0327 11/25/22 0526 11/28/22 0716 11/29/22 0336 12/01/22 0435 12/02/22 0833 01/19/23 1422  NA 141   < > 149*   < > 139 140 144  K 3.1*   < > 4.0   < > 4.4 3.6 4.1  CL 109   < > 110   < > 105 109 107  CO2 24   < > 27   < > 26 25 26   GLUCOSE 379*   < > 109*   < > 316* 219* 104*  BUN 36*   < > 24*   < > 19 14 21   CREATININE 1.28*   < > 1.11   < > 1.02 0.84 1.17  CALCIUM 6.8*   < > 8.4*   < > 7.9* 8.5* 9.7  MG 2.0  --  2.0  --  1.7  --   --   PHOS 2.0*  --  3.8  --  3.4  --   --     < > = values in this interval not displayed.   Recent Labs    11/29/22 0336 12/01/22 0435 12/02/22 0833  AST 49* 56* 40  ALT 32 27 30  ALKPHOS 248* 207* 208*  BILITOT 0.3 0.3 0.4  PROT 6.1* 5.9* 6.2*  ALBUMIN 1.7* 1.7* 1.9*   Recent Labs    12/01/22 0435 12/02/22 0833 12/03/22 0339 01/19/23 1422  WBC 8.8 6.4 5.7 4.8  NEUTROABS 6.6  --  3.5 2,237  HGB 9.8* 10.5* 10.1* 12.5*  HCT 31.3* 32.2* 31.7* 38.5  MCV 92.1 89.7 91.1 88.5  PLT 191 225 222 171   Lab Results  Component Value Date   TSH 0.957 11/21/2022   Lab Results  Component Value Date   HGBA1C 13.5 (H) 11/21/2022   Lab Results  Component Value Date   CHOL 159 06/21/2022   HDL 54 06/21/2022   LDLCALC 78 06/21/2022   TRIG 175 (H) 06/21/2022   CHOLHDL 2.9 06/21/2022    Significant Diagnostic Results in last 30 days:  No results found.  Assessment/Plan 1. Essential hypertension ***  2. Type 2 diabetes mellitus without complication, without long-term current use of insulin (HCC) ***    Family/ staff Communication: Reviewed plan of care with patient  Labs/tests ordered: None   Next Appointment :   Caesar Bookman, NP

## 2023-07-23 LAB — CBC WITH DIFFERENTIAL/PLATELET
Absolute Monocytes: 469 {cells}/uL (ref 200–950)
Basophils Absolute: 31 {cells}/uL (ref 0–200)
Basophils Relative: 0.6 %
Eosinophils Absolute: 179 {cells}/uL (ref 15–500)
Eosinophils Relative: 3.5 %
HCT: 42.3 % (ref 38.5–50.0)
Hemoglobin: 13.8 g/dL (ref 13.2–17.1)
Lymphs Abs: 1632 {cells}/uL (ref 850–3900)
MCH: 28.6 pg (ref 27.0–33.0)
MCHC: 32.6 g/dL (ref 32.0–36.0)
MCV: 87.6 fL (ref 80.0–100.0)
MPV: 13 fL — ABNORMAL HIGH (ref 7.5–12.5)
Monocytes Relative: 9.2 %
Neutro Abs: 2790 {cells}/uL (ref 1500–7800)
Neutrophils Relative %: 54.7 %
Platelets: 134 10*3/uL — ABNORMAL LOW (ref 140–400)
RBC: 4.83 10*6/uL (ref 4.20–5.80)
RDW: 14.6 % (ref 11.0–15.0)
Total Lymphocyte: 32 %
WBC: 5.1 10*3/uL (ref 3.8–10.8)

## 2023-07-23 LAB — COMPLETE METABOLIC PANEL WITH GFR
AG Ratio: 1.2 (calc) (ref 1.0–2.5)
ALT: 12 U/L (ref 9–46)
AST: 14 U/L (ref 10–35)
Albumin: 4.2 g/dL (ref 3.6–5.1)
Alkaline phosphatase (APISO): 100 U/L (ref 35–144)
BUN: 20 mg/dL (ref 7–25)
CO2: 27 mmol/L (ref 20–32)
Calcium: 9.4 mg/dL (ref 8.6–10.3)
Chloride: 104 mmol/L (ref 98–110)
Creat: 1.04 mg/dL (ref 0.70–1.35)
Globulin: 3.4 g/dL (ref 1.9–3.7)
Glucose, Bld: 123 mg/dL — ABNORMAL HIGH (ref 65–99)
Potassium: 4.1 mmol/L (ref 3.5–5.3)
Sodium: 141 mmol/L (ref 135–146)
Total Bilirubin: 0.5 mg/dL (ref 0.2–1.2)
Total Protein: 7.6 g/dL (ref 6.1–8.1)
eGFR: 80 mL/min/{1.73_m2} (ref >=60)

## 2023-07-23 LAB — LIPID PANEL
Cholesterol: 125 mg/dL (ref <200)
HDL: 47 mg/dL (ref >=40)
LDL Cholesterol (Calc): 53 mg/dL
Non-HDL Cholesterol (Calc): 78 mg/dL (ref <130)
Total CHOL/HDL Ratio: 2.7 (calc) (ref <5.0)
Triglycerides: 175 mg/dL — ABNORMAL HIGH (ref <150)

## 2023-07-23 LAB — HEMOGLOBIN A1C
Hgb A1c MFr Bld: 6.8 %{Hb} — ABNORMAL HIGH (ref <5.7)
Mean Plasma Glucose: 148 mg/dL
eAG (mmol/L): 8.2 mmol/L

## 2023-07-23 LAB — TSH: TSH: 1.97 m[IU]/L (ref 0.40–4.50)

## 2023-07-25 ENCOUNTER — Telehealth: Payer: Self-pay

## 2023-07-25 NOTE — Telephone Encounter (Signed)
-----   Message from Sharon Seller sent at 07/25/2023  8:23 AM EDT ----- Platelet count slightly low, otherwise blood counts are normal Will monitor.  Notify for abnormal bruising or bleeding Glucose is elevated but A1c has significantly improved from 8 months ago. Now at goal, continue dietary modifications and diabetic medications  electrolytes, liver and kidney function are normal  Cholesterol looks good- continue diet modifications and medications.

## 2023-08-22 ENCOUNTER — Encounter: Payer: Self-pay | Admitting: Family

## 2023-08-22 ENCOUNTER — Ambulatory Visit (INDEPENDENT_AMBULATORY_CARE_PROVIDER_SITE_OTHER): Payer: Medicare HMO | Admitting: Family

## 2023-08-22 VITALS — BP 140/90 | HR 66 | Temp 97.6°F | Ht 74.0 in | Wt 268.2 lb

## 2023-08-22 DIAGNOSIS — I10 Essential (primary) hypertension: Secondary | ICD-10-CM

## 2023-08-22 DIAGNOSIS — E119 Type 2 diabetes mellitus without complications: Secondary | ICD-10-CM

## 2023-08-22 MED ORDER — LOSARTAN POTASSIUM 100 MG PO TABS: 100.0000 mg | ORAL_TABLET | Freq: Every day | ORAL | 1 refills | Status: DC

## 2023-08-22 MED ORDER — SEMAGLUTIDE(0.25 OR 0.5MG/DOS) 2 MG/3ML ~~LOC~~ SOPN
0.5000 mg | PEN_INJECTOR | SUBCUTANEOUS | 3 refills | Status: DC
Start: 2023-08-22 — End: 2024-01-24

## 2023-08-22 NOTE — Progress Notes (Signed)
Provider: [name]   Raylin Diguglielmo, Donalee Citrin, NP  Patient Care Team: Daryle Amis, Donalee Citrin, NP as PCP - General (Family Medicine) Center, [facility] (Skilled Nursing Facility) [facility]  Extended Emergency Contact Information Primary Emergency Contact: [name],Nicole Address: [address]          [place], [place] [address] [place] of [place] Phone: [phone] Relation: Daughter Secondary Emergency Contact: Limas,Yavonda Mobile Phone: [phone] Relation: Daughter  Code Status:  Full Code  Goals of care: Advanced Directive information    07/22/2023   12:50 PM  Advanced Directives  Does Patient Have a Medical Advance Directive? No  Would patient like information on creating a medical advance directive? No - Patient declined     Chief Complaint  Patient presents with   Medical Management of Chronic Issues    Patient presents today for one month follow-up   Quality Metric Gaps    AWV,colonoscopy, foot exam,zoster, COVID, urine microalbumin    HPI:  Pt is a 62 y.o. male seen today for one month follow up type 2 DM.He was here 07/22/2023 was started on Ozempic for high blood sugars.Has been using Ozempic.He denies any side effects.states home CBG running in the 100's -150's at home. He denies any symptoms of hypoglycemia.  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      Past Medical History:  Diagnosis Date   Acute respiratory failure (HCC)    Cerebral edema (HCC)    Diabetes mellitus without complication (HCC)    diet controlled- no meds   Dysphagia as late effect of cerebrovascular disease    Fall at home    Hemiparesis affecting left side as late effect of stroke (HCC)    Hyperlipidemia    Hypertension    Stroke (HCC) 2016   Tuberculosis     11 yrs ago per pt.- treated with meds per pt.   Past Surgical History:  Procedure Laterality Date   CHOLECYSTECTOMY N/A 11/30/2022   Procedure: LAPAROSCOPIC CHOLECYSTECTOMY WITH INTRAOPERATIVE CHOLANGIOGRAM;  Surgeon: Harriette Bouillon, MD;  Location: [facility] OR;  Service: General;  Laterality: N/A;   COLONOSCOPY  10/06/2016   ESOPHAGOGASTRODUODENOSCOPY N/A 08/12/2015   Procedure: ESOPHAGOGASTRODUODENOSCOPY (EGD);  Surgeon: [name] Dare, MD;  Location: [facility] ENDOSCOPY;  Service: Endoscopy;  Laterality: N/A;   feeding tube removed     GASTROSTOMY TUBE PLACEMENT  05-19-15   mechanical thrombectomy angioplasty stenet placement     SKIN GRAFT  1980's   ankle   UPPER GASTROINTESTINAL ENDOSCOPY      No Known Allergies  Allergies as of 08/22/2023   No Known Allergies      Medication List        Accurate as of August 22, 2023  3:33 PM. If you have any questions, ask your nurse or doctor.          Accu-Chek Softclix Lancet Dev Kit 1 Device by Does not apply route in the morning, at noon, in the evening, and at bedtime.   amLODipine 10 MG tablet Commonly known as: NORVASC Take 1 tablet (10 mg total) by mouth daily.   apixaban 5 MG Tabs tablet Commonly known as: ELIQUIS Take 1 tablet (5 mg total) by mouth 2 (two) times daily.   atorvastatin 40 MG tablet Commonly known as: LIPITOR Take 1 tablet (40 mg total) by mouth daily.   Continuous Glucose  Monitor Sup Kit 1 Device by Does not apply route in the morning, at noon, in the evening, and at bedtime.   famotidine 20 MG tablet Commonly known as: PEPCID TAKE 1 TABLET BY MOUTH TWICE A DAY   Glucagon Emergency 1 MG Kit Inject 1 mg into the skin once as needed for up to 1 dose (low blood sugar).   insulin aspart 100 UNIT/ML FlexPen Commonly known as: NOVOLOG Inject 0-11 Units into the skin 3 (three) times daily with meals. Check sugar and inject per scale: BG <150= 0 unit; BG 150-200= 1 unit; BG 201-250= 3 unit; BG 251-300= 5 unit;  BG 301-350= 7 unit; BG 351-400= 9 unit; BG >400= 11 unit and Call Provider.   insulin glargine 100 UNIT/ML Solostar Pen Commonly known as: LANTUS Inject 45 Units into the skin 2 (two) times daily. May substitute as needed per insurance. 45 units in a.m. and 35 units in p.m.   Lancets Misc 1 Device by Does not apply route 3 (three) times daily before meals. Uses assure platinum glucometer E11.9   losartan 50 MG tablet Commonly known as: COZAAR Take 1 tablet (50 mg total) by mouth daily.   Pen Needles 31G X 5 MM Misc 1 each by Does not apply route 3 (three) times daily. May dispense any manufacturer covered by patient's insurance.   polyethylene glycol 17 g packet Commonly known as: MIRALAX / GLYCOLAX Take 17 g by mouth daily as needed for moderate constipation.   Semaglutide(0.25 or 0.5MG /DOS) 2 MG/3ML Sopn Inject 0.25 mg into the skin once a week.        Review of Systems  Constitutional:  Negative for appetite change, chills, fatigue, fever and unexpected weight change.  HENT:  Negative for congestion, ear discharge, ear pain, hearing loss, nosebleeds, postnasal drip, rhinorrhea, sinus pressure, sinus pain, sneezing, sore throat and tinnitus.   Eyes:  Negative for pain, discharge, redness, itching and visual disturbance.  Respiratory:  Negative for cough, chest tightness, shortness of breath and wheezing.   Cardiovascular:  Negative for chest pain, palpitations and leg swelling.  Gastrointestinal:  Negative for abdominal distention, abdominal pain, blood in stool, constipation, diarrhea, nausea and vomiting.  Endocrine: Negative for cold intolerance, heat intolerance, polydipsia, polyphagia and polyuria.  Genitourinary:  Negative for difficulty urinating, dysuria, flank pain, frequency and urgency.  Musculoskeletal:  Positive for gait problem. Negative for arthralgias, back pain, joint swelling, myalgias, neck pain and neck stiffness.  Skin:  Negative for color change, pallor  and rash.  Neurological:  Negative for dizziness, weakness, light-headedness, numbness and headaches.    Immunization History  Administered Date(s) Administered   Fluad Quad(high Dose 65+) 12/17/2021   Fluad Trivalent(High Dose 65+) 07/22/2023   Influenza,inj,Quad PF,6+ Mos 08/10/2015, 09/22/2016, 07/29/2017, 11/27/2018, 09/28/2019, 12/08/2020   Pneumococcal Conjugate-13 01/16/2019   Pneumococcal Polysaccharide-23 12/10/2016   Td 01/16/2019   Tdap 01/16/2019   Zoster Recombinant(Shingrix) 12/17/2021   Pertinent  Health Maintenance Due  Topic Date Due   FOOT EXAM  08/08/2021   Colonoscopy  03/24/2023   OPHTHALMOLOGY EXAM  11/08/2028 (Originally 07/28/2021)   HEMOGLOBIN A1C  01/19/2024   INFLUENZA VACCINE  Completed      11/21/2022    8:00 AM 11/21/2022    8:00 PM 01/19/2023    1:43 PM 07/22/2023   12:50 PM 08/22/2023    3:08 PM  Fall Risk  Falls in the past year?   0 0 0  Was there an injury with Fall?   0 0  0  Fall Risk Category Calculator   0 0 0  (RETIRED) Patient Fall Risk Level High fall risk High fall risk     Patient at Risk for Falls Due to   No Fall Risks No Fall Risks No Fall Risks  Fall risk Follow up    Falls evaluation completed Falls evaluation completed   Functional Status Survey:    Vitals:   08/22/23 1502 08/22/23 1514  BP: (!) 144/100 (!) 140/90  Pulse: 66   Temp: 97.6 F (36.4 C)   SpO2: 98%   Weight: 268 lb 3.2 oz (121.7 kg)   Height: 6\' 2"  (1.88 m)    Body mass index is 34.43 kg/m. Physical Exam Vitals reviewed.  Constitutional:      General: He is not in acute distress.    Appearance: Normal appearance. He is obese. He is not ill-appearing or diaphoretic.  HENT:     Head: Normocephalic.     Nose: Nose normal. No congestion or rhinorrhea.     Mouth/Throat:     Mouth: Mucous membranes are moist.     Pharynx: Oropharynx is clear. No oropharyngeal exudate or posterior oropharyngeal erythema.  Eyes:     General: No scleral icterus.        Right eye: No discharge.        Left eye: No discharge.     Conjunctiva/sclera: Conjunctivae normal.     Pupils: Pupils are equal, round, and reactive to light.  Neck:     Vascular: No carotid bruit.  Cardiovascular:     Rate and Rhythm: Normal rate and regular rhythm.     Pulses: Normal pulses.     Heart sounds: Normal heart sounds. No murmur heard.    No friction rub. No gallop.  Pulmonary:     Effort: Pulmonary effort is normal. No respiratory distress.     Breath sounds: Normal breath sounds. No wheezing, rhonchi or rales.  Chest:     Chest wall: No tenderness.  Abdominal:     General: Bowel sounds are normal. There is no distension.     Palpations: Abdomen is soft. There is no mass.     Tenderness: There is no abdominal tenderness. There is no right CVA tenderness, left CVA tenderness, guarding or rebound.  Musculoskeletal:        General: No swelling or tenderness. Normal range of motion.     Cervical back: Normal range of motion. No rigidity or tenderness.     Right lower leg: No edema.     Left lower leg: No edema.  Lymphadenopathy:     Cervical: No cervical adenopathy.  Skin:    General: Skin is warm and dry.     Coloration: Skin is not pale.     Findings: No erythema or rash.  Neurological:     Mental Status: He is alert and oriented to person, place, and time.     Cranial Nerves: No cranial nerve deficit.     Sensory: No sensory deficit.     Motor: No weakness.     Coordination: Coordination normal.     Gait: Gait abnormal.  Psychiatric:        Mood and Affect: Mood normal.        Speech: Speech normal.        Behavior: Behavior normal.     Labs reviewed: Recent Labs    11/24/22 0327 11/25/22 0526 11/28/22 0716 11/29/22 0336 12/01/22 0435 12/02/22 0833 01/19/23 1422 07/22/23 1350  NA 141   < >  149*   < > 139 140 144 141  K 3.1*   < > 4.0   < > 4.4 3.6 4.1 4.1  CL 109   < > 110   < > 105 109 107 104  CO2 24   < > 27   < > 26 25 26 27   GLUCOSE 379*    < > 109*   < > 316* 219* 104* 123*  BUN 36*   < > 24*   < > 19 14 21 20   CREATININE 1.28*   < > 1.11   < > 1.02 0.84 1.17 1.04  CALCIUM 6.8*   < > 8.4*   < > 7.9* 8.5* 9.7 9.4  MG 2.0  --  2.0  --  1.7  --   --   --   PHOS 2.0*  --  3.8  --  3.4  --   --   --    < > = values in this interval not displayed.   Recent Labs    11/29/22 0336 12/01/22 0435 12/02/22 0833 07/22/23 1350  AST 49* 56* 40 14  ALT 32 27 30 12   ALKPHOS 248* 207* 208*  --   BILITOT 0.3 0.3 0.4 0.5  PROT 6.1* 5.9* 6.2* 7.6  ALBUMIN 1.7* 1.7* 1.9*  --    Recent Labs    12/03/22 0339 01/19/23 1422 07/22/23 1350  WBC 5.7 4.8 5.1  NEUTROABS 3.5 2,237 2,790  HGB 10.1* 12.5* 13.8  HCT 31.7* 38.5 42.3  MCV 91.1 88.5 87.6  PLT 222 171 134*   Lab Results  Component Value Date   TSH 1.97 07/22/2023   Lab Results  Component Value Date   HGBA1C 6.8 (H) 07/22/2023   Lab Results  Component Value Date   CHOL 125 07/22/2023   HDL 47 07/22/2023   LDLCALC 53 07/22/2023   TRIG 175 (H) 07/22/2023   CHOLHDL 2.7 07/22/2023    Significant Diagnostic Results in last 30 days:  No results found.  Assessment/Plan  1. Type 2 diabetes mellitus without complication, without long-term current use of insulin (HCC) Lab Results  Component Value Date   HGBA1C 6.8 (H) 07/22/2023  -Continue to check blood sugars at 3 times daily and notify provider if less than 75 or greater than 150 -Continue on semaglutide increased from 0.25 to 0.5 mg injection weekly -Continue on Lantus and NovoLog - Microalbumin / creatinine urine ratio - Ambulatory referral to Podiatry - Ambulatory referral to Ophthalmology - Semaglutide,0.25 or 0.5MG /DOS, 2 MG/3ML SOPN; Inject 0.5 mg into the skin once a week.  Dispense: 3 mL; Refill: 3 - losartan (COZAAR) 100 MG tablet; Take 1 tablet (100 mg total) by mouth daily.  Dispense: 90 tablet; Refill: 1  2. Essential hypertension B/p elevated -Increase losartan to 100 mg daily -Continue on  amlodipine -Advised to check Blood pressure at home and record on log provided and notify provider if B/p > 140/90 - losartan (COZAAR) 100 MG tablet; Take 1 tablet (100 mg total) by mouth daily.  Dispense: 90 tablet; Refill: 1  Family/ staff Communication: Reviewed plan of care with patient understanding  Labs/tests ordered: None   Next Appointment : Return in about 1 month (around 09/22/2023) for Type 2 Diabetes mellitus and blood pressure .   Caesar Bookman, NP

## 2023-08-22 NOTE — Patient Instructions (Addendum)
  Adult nurse [facility]Endoscopy [address] [employer] [place], [place]    [place]: [phone] Fax: [phone]  - Increase Losartan from 50 mg to 100 mg tablet daily.may take two tablets of the current 50 mg tablet until complete.   - Increase Semaglutide from 0.25 mg injection to 0.5 mg injection once a week   - Notify provider if blood sugars runs in below 75 or great than 150

## 2023-08-23 LAB — MICROALBUMIN / CREATININE URINE RATIO
Creatinine, Urine: 226 mg/dL (ref 20–320)
Microalb Creat Ratio: 51 mg/g{creat} — ABNORMAL HIGH (ref <30)
Microalb, Ur: 11.5 mg/dL

## 2023-08-24 ENCOUNTER — Encounter: Payer: Self-pay | Admitting: Physician Assistant

## 2023-09-01 ENCOUNTER — Ambulatory Visit: Payer: Medicare HMO | Admitting: Podiatry

## 2023-09-01 ENCOUNTER — Encounter: Payer: Self-pay | Admitting: Podiatry

## 2023-09-01 VITALS — Ht 74.0 in | Wt 268.2 lb

## 2023-09-01 DIAGNOSIS — M79674 Pain in right toe(s): Secondary | ICD-10-CM | POA: Diagnosis not present

## 2023-09-01 DIAGNOSIS — B351 Tinea unguium: Secondary | ICD-10-CM

## 2023-09-01 DIAGNOSIS — M79675 Pain in left toe(s): Secondary | ICD-10-CM

## 2023-09-01 DIAGNOSIS — E1110 Type 2 diabetes mellitus with ketoacidosis without coma: Secondary | ICD-10-CM

## 2023-09-01 DIAGNOSIS — I639 Cerebral infarction, unspecified: Secondary | ICD-10-CM

## 2023-09-01 NOTE — Progress Notes (Signed)
This patient returns to my office for at risk foot care.  This patient requires this care by a professional since this patient will be at risk due to having coagulation defect, DM, CKD < CVA and dementia.This patient is unable to cut nails himself since the patient cannot reach his nails.These nails are painful walking and wearing shoes.  This patient presents for at risk foot care today.  General Appearance  Alert, conversant and in no acute stress.  Vascular  Dorsalis pedis and posterior tibial  pulses are  not palpable  bilaterally.  Capillary return is within normal limits  bilaterally. Temperature is within normal limits  bilaterally.  Neurologic  deferred due to CVA.  Nails Thick disfigured discolored nails with subungual debris  from hallux to fifth toes bilaterally. No evidence of bacterial infection or drainage bilaterally.  Orthopedic  No limitations of motion  feet .  No crepitus or effusions noted.  No bony pathology or digital deformities noted.  Skin  normotropic skin with no porokeratosis noted bilaterally.  No signs of infections or ulcers noted.     Onychomycosis  Pain in right toes  Pain in left toes  Consent was obtained for treatment procedures.   Mechanical debridement of nails 1-5  bilaterally performed with a nail nipper.  Filed with dremel without incident.    Return office visit   3 months                   Told patient to return for periodic foot care and evaluation due to potential at risk complications.   Helane Gunther DPM

## 2023-09-27 ENCOUNTER — Encounter: Payer: Self-pay | Admitting: Family

## 2023-09-27 ENCOUNTER — Ambulatory Visit (INDEPENDENT_AMBULATORY_CARE_PROVIDER_SITE_OTHER): Payer: Medicare HMO | Admitting: Family

## 2023-09-27 VITALS — BP 136/84 | HR 66 | Temp 97.6°F | Resp 17 | Ht 74.0 in | Wt 270.6 lb

## 2023-09-27 DIAGNOSIS — I10 Essential (primary) hypertension: Secondary | ICD-10-CM | POA: Diagnosis not present

## 2023-09-27 DIAGNOSIS — R2681 Unsteadiness on feet: Secondary | ICD-10-CM

## 2023-09-27 DIAGNOSIS — I69354 Hemiplegia and hemiparesis following cerebral infarction affecting left non-dominant side: Secondary | ICD-10-CM | POA: Diagnosis not present

## 2023-09-27 NOTE — Progress Notes (Signed)
Provider: [name]   [name] [name], [name] Citrin, NP  Patient Care Team: [name] [name], [name] Citrin, NP as PCP - General (Family Medicine) Center, [facility] (Skilled Nursing Facility) [facility]  Extended Emergency Contact Information Primary Emergency Contact: [name],[name] Address: [address]          [place], [place] [address] [place] Phone: [phone] Relation: Daughter Secondary Emergency Contact: [name],[name] Mobile Phone: [phone] Relation: Daughter  Code Status:  Full Code  Goals of care: Advanced Directive information    09/27/2023    2:50 PM  Advanced Directives  Does Patient Have a Medical Advance Directive? No  Would patient like information on creating a medical advance directive? No - Patient declined     Chief Complaint  Patient presents with   Medical Management of Chronic Issues    31month follow up and discuss shingles and covid vaccines and foot exam and medicare wellness visit and colonoscopy.    HPI:  Pt is a 62 y.o. male seen today for one month follow up for high blood pressure.He was here 08/22/2023 B/p was elevated 144/100.His Losartan was increased to 100 mg tablet daily.He was advised to follow today. He denies any headache,dizziness,vision changes,fatigue,chest tightness,palpitation,chest pain or shortness of breath.      Past Medical History:  Diagnosis Date   Acute respiratory failure (HCC)    Cerebral edema (HCC)    Diabetes mellitus without complication (HCC)    diet controlled- no meds   Dysphagia as late effect of cerebrovascular disease    Fall at home    Hemiparesis affecting left side as late effect of stroke (HCC)    Hyperlipidemia    Hypertension    Stroke (HCC) 2016   Tuberculosis    11 yrs ago per pt.- treated with meds per pt.   Past Surgical History:  Procedure Laterality Date   CHOLECYSTECTOMY N/A 11/30/2022   Procedure: LAPAROSCOPIC CHOLECYSTECTOMY WITH  INTRAOPERATIVE CHOLANGIOGRAM;  Surgeon: Harriette Bouillon, MD;  Location: [facility] OR;  Service: General;  Laterality: N/A;   COLONOSCOPY  10/06/2016   ESOPHAGOGASTRODUODENOSCOPY N/A 08/12/2015   Procedure: ESOPHAGOGASTRODUODENOSCOPY (EGD);  Surgeon: [name] Dare, MD;  Location: [facility] ENDOSCOPY;  Service: Endoscopy;  Laterality: N/A;   feeding tube removed     GASTROSTOMY TUBE PLACEMENT  05-19-15   mechanical thrombectomy angioplasty stenet placement     SKIN GRAFT  1980's   ankle   UPPER GASTROINTESTINAL ENDOSCOPY      No Known Allergies  Allergies as of 09/27/2023   No Known Allergies      Medication List        Accurate as of September 27, 2023  3:23 PM. If you have any questions, ask your nurse or doctor.          Accu-Chek Softclix Lancet Dev Kit 1 Device by Does not apply route in the morning, at noon, in the evening, and at bedtime.   amLODipine 10 MG tablet Commonly known as: NORVASC Take 1 tablet (10 mg total) by mouth daily.   apixaban 5 MG Tabs tablet Commonly known as: ELIQUIS Take 1 tablet (5 mg total) by mouth 2 (two) times daily.   atorvastatin 40 MG tablet Commonly known as: LIPITOR Take 1 tablet (40 mg total) by mouth daily.   Continuous Glucose Monitor Sup Kit 1 Device by Does not apply route in the morning, at noon, in the evening, and at bedtime.   famotidine 20 MG tablet Commonly known as:  PEPCID TAKE 1 TABLET BY MOUTH TWICE A DAY   Glucagon Emergency 1 MG Kit Inject 1 mg into the skin once as needed for up to 1 dose (low blood sugar).   insulin aspart 100 UNIT/ML FlexPen Commonly known as: NOVOLOG Inject 0-11 Units into the skin 3 (three) times daily with meals. Check sugar and inject per scale: BG <150= 0 unit; BG 150-200= 1 unit; BG 201-250= 3 unit; BG 251-300= 5 unit; BG 301-350= 7 unit; BG 351-400= 9 unit; BG >400= 11 unit and Call Provider.   insulin glargine 100 UNIT/ML Solostar Pen Commonly known as: LANTUS Inject 45 Units into the skin 2  (two) times daily. May substitute as needed per insurance. 45 units in a.m. and 35 units in p.m.   Lancets Misc 1 Device by Does not apply route 3 (three) times daily before meals. Uses assure platinum glucometer E11.9   losartan 100 MG tablet Commonly known as: COZAAR Take 1 tablet (100 mg total) by mouth daily.   Pen Needles 31G X 5 MM Misc 1 each by Does not apply route 3 (three) times daily. May dispense any manufacturer covered by patient's insurance.   polyethylene glycol 17 g packet Commonly known as: MIRALAX / GLYCOLAX Take 17 g by mouth daily as needed for moderate constipation.   Semaglutide(0.25 or 0.5MG /DOS) 2 MG/3ML Sopn Inject 0.5 mg into the skin once a week.        Review of Systems  Constitutional:  Negative for appetite change, chills, fatigue, fever and unexpected weight change.  HENT:  Negative for congestion, dental problem, ear discharge, ear pain, facial swelling, hearing loss, nosebleeds, postnasal drip, rhinorrhea, sinus pressure, sinus pain, sneezing, sore throat, tinnitus and trouble swallowing.   Eyes:  Negative for pain, discharge, redness, itching and visual disturbance.  Respiratory:  Negative for cough, chest tightness, shortness of breath and wheezing.   Cardiovascular:  Negative for chest pain, palpitations and leg swelling.  Gastrointestinal:  Negative for abdominal distention, abdominal pain, blood in stool, constipation, diarrhea, nausea and vomiting.  Endocrine: Negative for cold intolerance, heat intolerance, polydipsia, polyphagia and polyuria.  Genitourinary:  Negative for difficulty urinating, dysuria, flank pain, frequency and urgency.  Musculoskeletal:  Positive for gait problem. Negative for arthralgias, back pain, joint swelling, myalgias, neck pain and neck stiffness.  Skin:  Negative for color change, pallor, rash and wound.  Neurological:  Positive for weakness and numbness. Negative for dizziness, syncope, speech difficulty,  light-headedness and headaches.       Left hand contracture   Hematological:  Does not bruise/bleed easily.  Psychiatric/Behavioral:  Negative for agitation, behavioral problems, confusion, hallucinations, self-injury, sleep disturbance and suicidal ideas. The patient is not nervous/anxious.     Immunization History  Administered Date(s) Administered   Fluad Quad(high Dose 65+) 12/17/2021   Fluad Trivalent(High Dose 65+) 07/22/2023   Influenza,inj,Quad PF,6+ Mos 08/10/2015, 09/22/2016, 07/29/2017, 11/27/2018, 09/28/2019, 12/08/2020   Pneumococcal Conjugate-13 01/16/2019   Pneumococcal Polysaccharide-23 12/10/2016   Td 01/16/2019   Tdap 01/16/2019   Zoster Recombinant(Shingrix) 12/17/2021   Pertinent  Health Maintenance Due  Topic Date Due   FOOT EXAM  08/08/2021   Colonoscopy  03/24/2023   OPHTHALMOLOGY EXAM  11/08/2028 (Originally 07/28/2021)   HEMOGLOBIN A1C  01/19/2024   INFLUENZA VACCINE  Completed      11/21/2022    8:00 PM 01/19/2023    1:43 PM 07/22/2023   12:50 PM 08/22/2023    3:08 PM 09/27/2023    2:50 PM  Fall Risk  Falls in the past year?  0 0 0 0  Was there an injury with Fall?  0 0 0   Fall Risk Category Calculator  0 0 0   (RETIRED) Patient Fall Risk Level High fall risk      Patient at Risk for Falls Due to  No Fall Risks No Fall Risks No Fall Risks   Fall risk Follow up   Falls evaluation completed Falls evaluation completed    Functional Status Survey:    Vitals:   09/27/23 1453  BP: 136/84  Pulse: 66  Resp: 17  Temp: 97.6 F (36.4 C)  SpO2: 95%  Weight: 270 lb 9.6 oz (122.7 kg)  Height: 6\' 2"  (1.88 m)   Body mass index is 34.74 kg/m. Physical Exam Vitals reviewed.  Constitutional:      General: He is not in acute distress.    Appearance: Normal appearance. He is obese. He is not ill-appearing or diaphoretic.  HENT:     Head: Normocephalic.     Right Ear: Tympanic membrane, ear canal and external ear normal. There is no impacted cerumen.      Left Ear: Tympanic membrane, ear canal and external ear normal. There is no impacted cerumen.     Nose: Nose normal. No congestion or rhinorrhea.     Mouth/Throat:     Mouth: Mucous membranes are moist.     Pharynx: Oropharynx is clear. No oropharyngeal exudate or posterior oropharyngeal erythema.  Eyes:     General: No scleral icterus.       Right eye: No discharge.        Left eye: No discharge.     Extraocular Movements: Extraocular movements intact.     Conjunctiva/sclera: Conjunctivae normal.     Pupils: Pupils are equal, round, and reactive to light.  Neck:     Vascular: No carotid bruit.  Cardiovascular:     Rate and Rhythm: Normal rate and regular rhythm.     Pulses: Normal pulses.     Heart sounds: Normal heart sounds. No murmur heard.    No friction rub. No gallop.  Pulmonary:     Effort: Pulmonary effort is normal. No respiratory distress.     Breath sounds: Normal breath sounds. No wheezing, rhonchi or rales.  Chest:     Chest wall: No tenderness.  Abdominal:     General: Bowel sounds are normal. There is no distension.     Palpations: Abdomen is soft. There is no mass.     Tenderness: There is no abdominal tenderness. There is no right CVA tenderness, left CVA tenderness, guarding or rebound.  Musculoskeletal:        General: No swelling or tenderness. Normal range of motion.     Cervical back: Normal range of motion. No rigidity or tenderness.     Right lower leg: No edema.     Left lower leg: No edema.  Lymphadenopathy:     Cervical: No cervical adenopathy.  Skin:    General: Skin is warm and dry.     Coloration: Skin is not pale.     Findings: No bruising, erythema, lesion or rash.  Neurological:     Mental Status: He is alert and oriented to person, place, and time.     Cranial Nerves: No cranial nerve deficit.     Sensory: No sensory deficit.     Motor: Weakness present.     Coordination: Coordination normal.     Gait: Gait abnormal.  Comments:  Left hand contracture   Psychiatric:        Mood and Affect: Mood normal.        Speech: Speech normal.        Behavior: Behavior normal.        Thought Content: Thought content normal.        Judgment: Judgment normal.     Labs reviewed: Recent Labs    11/24/22 0327 11/25/22 0526 11/28/22 0716 11/29/22 0336 12/01/22 0435 12/02/22 0833 01/19/23 1422 07/22/23 1350  NA 141   < > 149*   < > 139 140 144 141  K 3.1*   < > 4.0   < > 4.4 3.6 4.1 4.1  CL 109   < > 110   < > 105 109 107 104  CO2 24   < > 27   < > 26 25 26 27   GLUCOSE 379*   < > 109*   < > 316* 219* 104* 123*  BUN 36*   < > 24*   < > 19 14 21 20   CREATININE 1.28*   < > 1.11   < > 1.02 0.84 1.17 1.04  CALCIUM 6.8*   < > 8.4*   < > 7.9* 8.5* 9.7 9.4  MG 2.0  --  2.0  --  1.7  --   --   --   PHOS 2.0*  --  3.8  --  3.4  --   --   --    < > = values in this interval not displayed.   Recent Labs    11/29/22 0336 12/01/22 0435 12/02/22 0833 07/22/23 1350  AST 49* 56* 40 14  ALT 32 27 30 12   ALKPHOS 248* 207* 208*  --   BILITOT 0.3 0.3 0.4 0.5  PROT 6.1* 5.9* 6.2* 7.6  ALBUMIN 1.7* 1.7* 1.9*  --    Recent Labs    12/03/22 0339 01/19/23 1422 07/22/23 1350  WBC 5.7 4.8 5.1  NEUTROABS 3.5 2,237 2,790  HGB 10.1* 12.5* 13.8  HCT 31.7* 38.5 42.3  MCV 91.1 88.5 87.6  PLT 222 171 134*   Lab Results  Component Value Date   TSH 1.97 07/22/2023   Lab Results  Component Value Date   HGBA1C 6.8 (H) 07/22/2023   Lab Results  Component Value Date   CHOL 125 07/22/2023   HDL 47 07/22/2023   LDLCALC 53 07/22/2023   TRIG 175 (H) 07/22/2023   CHOLHDL 2.7 07/22/2023    Significant Diagnostic Results in last 30 days:  No results found.  Assessment/Plan 1. Essential hypertension -Blood pressure well-controlled -Dietary modification and exercise at least 30 minutes three times per week advised -Continue on losartan and amlodipine  2. Hemiparesis affecting left side as late effect of stroke (HCC) Continue  to control high risk factors -Continue on atorvastatin, losartan, amlodipine and Eliquis -[place] transportation forms completed unable to use regular city bus route due to high risk for falls  3. Unsteady gait Remains high risk for falls due to left-sided weakness  -Encouraged to use assistive device while walking  Family/ staff Communication: Reviewed plan of care with patient verbalized understanding  Labs/tests ordered: None   Next Appointment : Return in about 6 months (around 03/26/2024) for medical mangement of chronic issues.Caesar Bookman, NP

## 2023-10-03 ENCOUNTER — Telehealth: Payer: Self-pay

## 2023-10-03 NOTE — Patient Outreach (Signed)
Attempted to contact patient regarding care gaps. Left voicemail for patient to return my call at [phone].  Nicholes Rough, CMA Care Guide [employer]

## 2023-11-20 ENCOUNTER — Other Ambulatory Visit: Payer: Self-pay | Admitting: Family

## 2023-11-21 NOTE — Telephone Encounter (Signed)
 Patient refill has warnings. Pend and sent to PCP Ngetich, Donalee Citrin, NP for approval.

## 2023-11-24 ENCOUNTER — Ambulatory Visit: Payer: Medicare HMO | Admitting: Physician Assistant

## 2023-11-24 NOTE — Progress Notes (Deleted)
11/24/2023 [name] [name] [id number] 1961-06-06  Referring provider: Caesar Bookman, NP Primary GI doctor: Dr. Russella Dar patient is never been seen in the office, last seen 2016 in the hospital  ASSESSMENT AND PLAN:   Assessment and Plan              Patient Care Team: Ngetich, Donalee Citrin, NP as PCP - General (Family Medicine) Center, [facility] (Skilled Nursing Facility) [facility]  HISTORY OF PRESENT ILLNESS: 63 y.o. male with a past medical history of A-fib new onset last year on Eliquis, hypertension, history of CVA 2011/2016 with dysphagia left-sided hemiparesis status post G-tube removed on baby aspirin, on dysphagia 1 diet, QT prolongation, obesity, GERD with history of H. pylori, constipation, history of polyps, history of cholelithiasis, type 2 diabetes with dyslipidemia, history of DKA, neuropathy, CKD and others listed below presents for evaluation of IDA.   Patient  initially seen 2016 in the hospital for melena on Plavix. Had EGD with gastritis, H. pylori positive.  Treated but I do not see eradication study. Patient had screening colonoscopies with tubular adenomatous polyps, poor prep.  Last 03/2018 recall 5 years.  Recent hospital mission 01/14 through 01/29 due to severe sepsis multiple species bacteremia and UTI, hypotension found to have A-fib with RVR's, DKA with glucose level 1100, creatinine 5.5.   Blood culture showed multiple species growth Proteus Mirabella's, Staph epidermidis and lactobacillus species.  Urine culture showed 50,000 CFU per mL of E. coli ,  Patient had acute cholecystitis and acute biliary pancreatitis lipase thousand, CT abdomen pelvis showed interstitial pancreatitis minimal nonlocalizing peripancreatic fluid without abscess or free air, right upper quadrant ultrasound showed gallstones and acute cholecystitis Status post cholecystectomy 1/23 with IOP unremarkable. After surgery patient had postop  anemia. Hgb 10.1 on discharge 1/26, repeat 01/19/2023 Hgb 12.5, most recent 07/22/2023 shows Hgb 13.8. 07/21/2013 thrombocytopenia 134, CT 11/21/2022 no splenomegaly or signs of portal hypertension, but poor imaging, right upper quadrant ultrasound showed liver mildly enlarged increase in parenchymal echogenicity. Patient had elevated transaminitis during hospitalization AST 150, ALT 73, most recent labs from 07/22/2023 showed normal total bili, alk phos, AST and ALT.  Patient had previous workup for transaminitis at [facility] in 2016 negative hepatitis panel.  Discussed the use of AI scribe software for clinical note transcription with the patient, who gave verbal consent to proceed.  History of Present Illness             He  reports that he quit smoking about 3 years ago. His smoking use included cigarettes. He has never used smokeless tobacco. He reports that he does not drink alcohol and does not use drugs.  RELEVANT GI HISTORY, LABS, IMAGING: 08/12/2015 EGD for melena ENDOSCOPIC IMPRESSION: 1. Erosive gastritis in the gastric antrum 2. Nonerosive duodenitis in the duodenal bulb 3. Prior G tube site in gastric body  10/06/2016 colonoscopy for screening purposes - Preparation of the colon was unsatisfactory. - One 5 mm polyp in the rectum, removed with a cold biopsy forceps. Resected and retrieved. - Two 6 to 7 mm polyps in the rectum, removed with a hot snare. Resected and retrieved. - Internal hemorrhoids. - Four 5 to 7 mm polyps in the rectum and in the transverse colon, removed with a cold snare. Resected and retrieved. - The examination was otherwise normal on direct and retroflexion views.  03/23/2018 colonoscopy repeat for poor prep - One 4 mm polyp in the transverse colon, removed with  a cold biopsy forceps. Resected and retrieved. - Four 6 to 7 mm polyps in the rectum, in the sigmoid colon and in the transverse colon, removed with a cold snare. Resected and retrieved. - Internal  hemorrhoids. - The examination was otherwise normal on direct and retroflexion views. Recall 5 years   CBC    Component Value Date/Time   WBC 5.1 07/22/2023 1350   RBC 4.83 07/22/2023 1350   HGB 13.8 07/22/2023 1350   HGB 13.7 05/07/2016 1134   HCT 42.3 07/22/2023 1350   HCT 41.8 05/07/2016 1134   PLT 134 (L) 07/22/2023 1350   PLT 115 (L) 05/07/2016 1134   MCV 87.6 07/22/2023 1350   MCV 86 05/07/2016 1134   MCH 28.6 07/22/2023 1350   MCHC 32.6 07/22/2023 1350   RDW 14.6 07/22/2023 1350   RDW 15.3 05/07/2016 1134   LYMPHSABS 1,632 07/22/2023 1350   LYMPHSABS 1.3 05/07/2016 1134   MONOABS 0.4 12/03/2022 0339   EOSABS 179 07/22/2023 1350   EOSABS 0.1 05/07/2016 1134   BASOSABS 31 07/22/2023 1350   BASOSABS 0.0 05/07/2016 1134   Recent Labs    11/25/22 0415 11/26/22 0515 11/27/22 0250 11/28/22 0442 11/29/22 0336 12/01/22 0435 12/02/22 0833 12/03/22 0339 01/19/23 1422 07/22/23 1350  HGB 11.7* 11.4* 10.5* 9.3* 10.5* 9.8* 10.5* 10.1* 12.5* 13.8    CMP     Component Value Date/Time   NA 141 07/22/2023 1350   NA 142 05/07/2016 1134   K 4.1 07/22/2023 1350   CL 104 07/22/2023 1350   CO2 27 07/22/2023 1350   GLUCOSE 123 (H) 07/22/2023 1350   BUN 20 07/22/2023 1350   BUN 13 05/07/2016 1134   CREATININE 1.04 07/22/2023 1350   CALCIUM 9.4 07/22/2023 1350   PROT 7.6 07/22/2023 1350   PROT 7.3 01/28/2016 0946   ALBUMIN 1.9 (L) 12/02/2022 0833   ALBUMIN 4.5 01/28/2016 0946   AST 14 07/22/2023 1350   ALT 12 07/22/2023 1350   ALKPHOS 208 (H) 12/02/2022 0833   BILITOT 0.5 07/22/2023 1350   BILITOT 0.2 01/28/2016 0946   GFRNONAA >60 12/02/2022 0833   GFRNONAA 57 (L) 05/22/2020 1031   GFRAA 66 05/22/2020 1031      Latest Ref Rng & Units 07/22/2023    1:50 PM 12/02/2022    8:33 AM 12/01/2022    4:35 AM  Hepatic Function  Total Protein 6.1 - 8.1 g/dL 7.6  6.2  5.9   Albumin 3.5 - 5.0 g/dL  1.9  1.7   AST 10 - 35 U/L 14  40  56   ALT 9 - 46 U/L 12  30  27    Alk  Phosphatase 38 - 126 U/L  208  207   Total Bilirubin 0.2 - 1.2 mg/dL 0.5  0.4  0.3       Current Medications:   Current Outpatient Medications (Endocrine & Metabolic):    Glucagon, rDNA, (GLUCAGON EMERGENCY) 1 MG KIT, Inject 1 mg into the skin once as needed for up to 1 dose (low blood sugar).   insulin aspart (NOVOLOG) 100 UNIT/ML FlexPen, Inject 0-11 Units into the skin 3 (three) times daily with meals. Check sugar and inject per scale: BG <150= 0 unit; BG 150-200= 1 unit; BG 201-250= 3 unit; BG 251-300= 5 unit; BG 301-350= 7 unit; BG 351-400= 9 unit; BG >400= 11 unit and Call Provider.   insulin glargine (LANTUS) 100 UNIT/ML Solostar Pen, Inject 45 Units into the skin 2 (two) times daily. May substitute  as needed per insurance. 45 units in a.m. and 35 units in p.m.   Semaglutide,0.25 or 0.5MG /DOS, 2 MG/3ML SOPN, Inject 0.5 mg into the skin once a week.  Current Outpatient Medications (Cardiovascular):    amLODipine (NORVASC) 10 MG tablet, Take 1 tablet (10 mg total) by mouth daily.   atorvastatin (LIPITOR) 40 MG tablet, Take 1 tablet (40 mg total) by mouth daily.   losartan (COZAAR) 100 MG tablet, Take 1 tablet (100 mg total) by mouth daily.    Current Outpatient Medications (Hematological):    apixaban (ELIQUIS) 5 MG TABS tablet, Take 1 tablet (5 mg total) by mouth 2 (two) times daily.  Current Outpatient Medications (Other):    Continuous Glucose Monitor Sup KIT, 1 Device by Does not apply route in the morning, at noon, in the evening, and at bedtime.   famotidine (PEPCID) 20 MG tablet, TAKE 1 TABLET BY MOUTH TWICE A DAY   Insulin Pen Needle (PEN NEEDLES) 31G X 5 MM MISC, 1 each by Does not apply route 3 (three) times daily. May dispense any manufacturer covered by patient's insurance.   Lancets Misc. (ACCU-CHEK SOFTCLIX LANCET DEV) KIT, 1 Device by Does not apply route in the morning, at noon, in the evening, and at bedtime.   Lancets MISC, 1 Device by Does not apply route 3 (three)  times daily before meals. Uses assure platinum glucometer E11.9   polyethylene glycol (MIRALAX / GLYCOLAX) 17 g packet, Take 17 g by mouth daily as needed for moderate constipation.  Medical History:  Past Medical History:  Diagnosis Date   Acute respiratory failure (HCC)    Cerebral edema (HCC)    Diabetes mellitus without complication (HCC)    diet controlled- no meds   Dysphagia as late effect of cerebrovascular disease    Fall at home    Hemiparesis affecting left side as late effect of stroke (HCC)    Hyperlipidemia    Hypertension    Stroke (HCC) 2016   Tuberculosis    11 yrs ago per pt.- treated with meds per pt.   Allergies: No Known Allergies   Surgical History:  He  has a past surgical history that includes Gastrostomy tube placement (05-19-15); mechanical thrombectomy angioplasty stenet placement; Esophagogastroduodenoscopy (N/A, 08/12/2015); feeding tube removed; Upper gastrointestinal endoscopy; Colonoscopy (10/06/2016); Skin graft (1980's); and Cholecystectomy (N/A, 11/30/2022). Family History:  His family history includes Alcohol abuse in his brother, father, and sister.  REVIEW OF SYSTEMS  : All other systems reviewed and negative except where noted in the History of Present Illness.  PHYSICAL EXAM: There were no vitals taken for this visit. General Appearance: Well nourished, in no apparent distress. Head:   Normocephalic and atraumatic. Eyes:  sclerae anicteric,conjunctive pink  Respiratory: Respiratory effort normal, BS equal bilaterally without rales, rhonchi, wheezing. Cardio: RRR with no MRGs. Peripheral pulses intact.  Abdomen: Soft,  {BlankSingle:19197::"Flat","Obese","Non-distended"} ,active bowel sounds. {actendernessAB:27319} tenderness {anatomy; site abdomen:5010}. {BlankMultiple:19196::"Without guarding","With guarding","Without rebound","With rebound"}. No masses. Rectal: {acrectalexam:27461} Musculoskeletal: Full ROM, {PSY - GAIT AND STATION:22860}  gait. {With/Without:304960234} edema. Skin:  Dry and intact without significant lesions or rashes Neuro: Alert and  oriented x4;  No focal deficits. Psych:  Cooperative. Normal mood and affect.    Doree Albee, PA-C 9:20 AM

## 2023-12-05 ENCOUNTER — Ambulatory Visit (INDEPENDENT_AMBULATORY_CARE_PROVIDER_SITE_OTHER): Payer: Medicare HMO | Admitting: Podiatry

## 2023-12-05 ENCOUNTER — Encounter: Payer: Self-pay | Admitting: Podiatry

## 2023-12-05 DIAGNOSIS — M79674 Pain in right toe(s): Secondary | ICD-10-CM | POA: Diagnosis not present

## 2023-12-05 DIAGNOSIS — B351 Tinea unguium: Secondary | ICD-10-CM | POA: Diagnosis not present

## 2023-12-05 DIAGNOSIS — M79675 Pain in left toe(s): Secondary | ICD-10-CM | POA: Diagnosis not present

## 2023-12-05 DIAGNOSIS — E1110 Type 2 diabetes mellitus with ketoacidosis without coma: Secondary | ICD-10-CM | POA: Diagnosis not present

## 2023-12-05 NOTE — Progress Notes (Signed)
This patient returns to my office for at risk foot care.  This patient requires this care by a professional since this patient will be at risk due to having coagulation defect, DM, CKD < CVA and dementia.This patient is unable to cut nails himself since the patient cannot reach his nails.These nails are painful walking and wearing shoes.  This patient presents for at risk foot care today.  General Appearance  Alert, conversant and in no acute stress.  Vascular  Dorsalis pedis and posterior tibial  pulses are  not palpable  bilaterally.  Capillary return is within normal limits  bilaterally. Temperature is within normal limits  bilaterally.  Neurologic  deferred due to CVA.  Nails Thick disfigured discolored nails with subungual debris  from hallux to fifth toes bilaterally. No evidence of bacterial infection or drainage bilaterally.  Orthopedic  No limitations of motion  feet .  No crepitus or effusions noted.  No bony pathology or digital deformities noted.  Skin  normotropic skin with no porokeratosis noted bilaterally.  No signs of infections or ulcers noted.     Onychomycosis  Pain in right toes  Pain in left toes  Consent was obtained for treatment procedures.   Mechanical debridement of nails 1-5  bilaterally performed with a nail nipper.  Filed with dremel without incident.    Return office visit   3 months                   Told patient to return for periodic foot care and evaluation due to potential at risk complications.   Helane Gunther DPM

## 2023-12-15 ENCOUNTER — Other Ambulatory Visit: Payer: Self-pay | Admitting: Family

## 2023-12-21 ENCOUNTER — Telehealth: Payer: Self-pay

## 2023-12-21 NOTE — Telephone Encounter (Signed)
Patient call this morning to stated that he has not received refills from [facility]. I called patient pharmacy to confirm both refill that needed to be refill and the pharmacist said that they are ready to be picked up.  Left message on voicemail for patient to return call when available

## 2024-01-18 ENCOUNTER — Other Ambulatory Visit: Payer: Self-pay | Admitting: Family

## 2024-01-18 MED ORDER — APIXABAN 5 MG PO TABS: 5.0000 mg | ORAL_TABLET | Freq: Two times a day (BID) | ORAL | 1 refills | Status: DC

## 2024-01-18 NOTE — Telephone Encounter (Signed)
 Copied from CRM [id number]. Topic: Clinical - Medication Refill >> Jan 18, 2024  8:26 AM [name] [name] wrote: Most Recent Primary Care Visit:  Provider: Caesar Bookman  Department: [facility]  Visit Type: OFFICE VISIT  Date: 09/27/2023  Medication: apixaban (ELIQUIS) 5 MG TABS tablet   Has the patient contacted their pharmacy? No (Agent: If no, request that the patient contact the pharmacy for the refill. If patient does not wish to contact the pharmacy document the reason why and proceed with request.) (Agent: If yes, when and what did the pharmacy advise?)  Patient was advised to contact pharmacy to see if refills available.   Is this the correct pharmacy for this prescription? Yes If no, delete pharmacy and type the correct one.  This is the patient's preferred pharmacy:  [id number] [place], [place] - [address] AT [facility] [name] [place] [address] Phone: [phone] Fax: [phone]   Has the prescription been filled recently? No  Is the patient out of the medication? Yes  Has the patient been seen for an appointment in the last year OR does the patient have an upcoming appointment? Yes  Can we respond through MyChart? No  Agent: Please be advised that Rx refills may take up to 3 business days. We ask that you follow-up with your pharmacy.

## 2024-01-24 ENCOUNTER — Encounter: Payer: Self-pay | Admitting: Family

## 2024-01-24 ENCOUNTER — Ambulatory Visit (INDEPENDENT_AMBULATORY_CARE_PROVIDER_SITE_OTHER): Payer: Medicare (Managed Care) | Admitting: Family

## 2024-01-24 VITALS — BP 138/82 | HR 66 | Temp 97.9°F | Resp 17 | Ht 74.0 in | Wt 294.6 lb

## 2024-01-24 DIAGNOSIS — F321 Major depressive disorder, single episode, moderate: Secondary | ICD-10-CM

## 2024-01-24 DIAGNOSIS — I69354 Hemiplegia and hemiparesis following cerebral infarction affecting left non-dominant side: Secondary | ICD-10-CM | POA: Diagnosis not present

## 2024-01-24 DIAGNOSIS — F0150 Vascular dementia without behavioral disturbance: Secondary | ICD-10-CM

## 2024-01-24 DIAGNOSIS — E781 Pure hyperglyceridemia: Secondary | ICD-10-CM

## 2024-01-24 DIAGNOSIS — E119 Type 2 diabetes mellitus without complications: Secondary | ICD-10-CM | POA: Diagnosis not present

## 2024-01-24 DIAGNOSIS — I10 Essential (primary) hypertension: Secondary | ICD-10-CM | POA: Diagnosis not present

## 2024-01-24 DIAGNOSIS — K219 Gastro-esophageal reflux disease without esophagitis: Secondary | ICD-10-CM | POA: Diagnosis not present

## 2024-01-24 DIAGNOSIS — R2681 Unsteadiness on feet: Secondary | ICD-10-CM

## 2024-01-24 MED ORDER — SEMAGLUTIDE (1 MG/DOSE) 4 MG/3ML ~~LOC~~ SOPN
1.0000 mg | PEN_INJECTOR | SUBCUTANEOUS | 3 refills | Status: DC
Start: 2024-01-24 — End: 2024-07-26

## 2024-01-25 DIAGNOSIS — H25813 Combined forms of age-related cataract, bilateral: Secondary | ICD-10-CM | POA: Diagnosis not present

## 2024-01-25 DIAGNOSIS — D3132 Benign neoplasm of left choroid: Secondary | ICD-10-CM | POA: Diagnosis not present

## 2024-01-25 DIAGNOSIS — H04123 Dry eye syndrome of bilateral lacrimal glands: Secondary | ICD-10-CM | POA: Diagnosis not present

## 2024-01-25 DIAGNOSIS — E119 Type 2 diabetes mellitus without complications: Secondary | ICD-10-CM | POA: Diagnosis not present

## 2024-01-25 LAB — LIPID PANEL
Cholesterol: 130 mg/dL (ref <200)
HDL: 53 mg/dL (ref >=40)
LDL Cholesterol (Calc): 61 mg/dL
Non-HDL Cholesterol (Calc): 77 mg/dL (ref <130)
Total CHOL/HDL Ratio: 2.5 (calc) (ref <5.0)
Triglycerides: 76 mg/dL (ref <150)

## 2024-01-25 LAB — COMPLETE METABOLIC PANEL WITH GFR
AG Ratio: 1.5 (calc) (ref 1.0–2.5)
ALT: 18 U/L (ref 9–46)
AST: 19 U/L (ref 10–35)
Albumin: 4.6 g/dL (ref 3.6–5.1)
Alkaline phosphatase (APISO): 93 U/L (ref 35–144)
BUN: 25 mg/dL (ref 7–25)
CO2: 27 mmol/L (ref 20–32)
Calcium: 9.7 mg/dL (ref 8.6–10.3)
Chloride: 103 mmol/L (ref 98–110)
Creat: 1.08 mg/dL (ref 0.70–1.35)
Globulin: 3.1 g/dL (ref 1.9–3.7)
Glucose, Bld: 109 mg/dL — ABNORMAL HIGH (ref 65–99)
Potassium: 4.3 mmol/L (ref 3.5–5.3)
Sodium: 140 mmol/L (ref 135–146)
Total Bilirubin: 0.5 mg/dL (ref 0.2–1.2)
Total Protein: 7.7 g/dL (ref 6.1–8.1)

## 2024-01-25 LAB — CBC WITH DIFFERENTIAL/PLATELET
Absolute Lymphocytes: 1575 {cells}/uL (ref 850–3900)
Absolute Monocytes: 475 {cells}/uL (ref 200–950)
Basophils Absolute: 40 {cells}/uL (ref 0–200)
Basophils Relative: 0.9 %
Eosinophils Absolute: 110 {cells}/uL (ref 15–500)
Eosinophils Relative: 2.5 %
HCT: 39.8 % (ref 38.5–50.0)
Hemoglobin: 13.2 g/dL (ref 13.2–17.1)
MCH: 28.8 pg (ref 27.0–33.0)
MCHC: 33.2 g/dL (ref 32.0–36.0)
MCV: 86.9 fL (ref 80.0–100.0)
MPV: 12.2 fL (ref 7.5–12.5)
Monocytes Relative: 10.8 %
Neutro Abs: 2200 {cells}/uL (ref 1500–7800)
Neutrophils Relative %: 50 %
Platelets: 117 10*3/uL — ABNORMAL LOW (ref 140–400)
RBC: 4.58 10*6/uL (ref 4.20–5.80)
RDW: 14.2 % (ref 11.0–15.0)
Total Lymphocyte: 35.8 %
WBC: 4.4 10*3/uL (ref 3.8–10.8)

## 2024-01-25 LAB — HEMOGLOBIN A1C
Hgb A1c MFr Bld: 7.8 %{Hb} — ABNORMAL HIGH
Mean Plasma Glucose: 177 mg/dL
eAG (mmol/L): 9.8 mmol/L

## 2024-01-25 LAB — HM DIABETES EYE EXAM

## 2024-01-25 LAB — TSH: TSH: 2.18 m[IU]/L (ref 0.40–4.50)

## 2024-02-05 NOTE — Progress Notes (Signed)
 Provider: [name]   [name], [name] Citrin, NP  Patient Care Team: Takeria Marquina, [name] Citrin, NP as PCP - General (Family Medicine) Center, [facility] (Skilled Nursing Facility) [facility]  Extended Emergency Contact Information Primary Emergency Contact: [name],Nicole Address: [address]          [place], [place] [address] [place] Phone: [phone] Relation: Daughter Secondary Emergency Contact: Mounger,Yavonda Mobile Phone: [phone] Relation: Daughter  Code Status:  Full Code Goals of care: Advanced Directive information    01/24/2024    2:18 PM  Advanced Directives  Does Patient Have a Medical Advance Directive? No  Would patient like information on creating a medical advance directive? No - Patient declined     Chief Complaint  Patient presents with   Medical Management of Chronic Issues    6 month follow up.    HPI:  Pt is a 63 y.o. male seen today for medical management of chronic diseases.   Discussed the use of AI scribe software for clinical note transcription with the patient, who gave verbal consent to proceed.  History of Present Illness   [name] Agena. is a 63 year old male with hypertension, type 2 diabetes, hypertriglyceridemia, and a history of stroke who presents for a six-month follow-up. He is accompanied by his baby mom to the appointment.  He has experienced significant weight gain, increasing from 271 pounds to 294.6 pounds since his last visit. No changes in diet or exercise, as he has not been exercising and continues to consume lunch meat, wheat bread (about three slices a day), cereal in the morning, and diet soda occasionally.  He has a history of type 2 diabetes with a previous A1c of 6.8, down from 13.5. He is concerned about potential weight gain affecting his blood sugar levels. He administers insulin three times daily with meals, sometimes two to four units depending on his blood  sugar levels, and takes 45 units of Lantus twice daily. He also uses Ozempic, administered on Mondays in the morning.  His hypertension is managed with losartan 100 mg daily and amlodipine 10 mg daily. He also takes atorvastatin 40 mg for high cholesterol and reports no side effects.  He has a history of hypertriglyceridemia, with previous triglyceride levels at 175 mg/dL. He denies consuming sweets or sugary foods but admits to eating bread and cereal.  He has a history of stroke, resulting in weakness in his left hand, which is contracted and sometimes painful. He uses a cane for stability when walking outside and reports no recent falls.  He experiences occasional constipation, managed with Miralax as needed, and takes Pepcid 20 mg twice daily for acid reflux. No recent episodes of heartburn.  He reports a history of depression, stating it is 'all right'. He also mentions difficulty sleeping at night, not feeling tired, and occasionally using sleep aids like PM medications.  No recent bleeding or signs of bleeding while on Eliquis 5 mg twice daily. No numbness or tingling in his feet and regularly visits a foot doctor for toenail trimming.   Past Medical History:  Diagnosis Date   Acute respiratory failure (HCC)    Cerebral edema (HCC)    Diabetes mellitus without complication (HCC)    diet controlled- no meds   Dysphagia as late effect of cerebrovascular disease    Fall at home    Hemiparesis affecting left side as late effect of stroke (HCC)    Hyperlipidemia  Hypertension    Stroke (HCC) 2016   Tuberculosis    11 yrs ago per pt.- treated with meds per pt.   Past Surgical History:  Procedure Laterality Date   CHOLECYSTECTOMY N/A 11/30/2022   Procedure: LAPAROSCOPIC CHOLECYSTECTOMY WITH INTRAOPERATIVE CHOLANGIOGRAM;  Surgeon: Harriette Bouillon, MD;  Location: [facility] OR;  Service: General;  Laterality: N/A;   COLONOSCOPY  10/06/2016   ESOPHAGOGASTRODUODENOSCOPY N/A 08/12/2015    Procedure: ESOPHAGOGASTRODUODENOSCOPY (EGD);  Surgeon: [name] Dare, MD;  Location: [facility] ENDOSCOPY;  Service: Endoscopy;  Laterality: N/A;   feeding tube removed     GASTROSTOMY TUBE PLACEMENT  05-19-15   mechanical thrombectomy angioplasty stenet placement     SKIN GRAFT  1980's   ankle   UPPER GASTROINTESTINAL ENDOSCOPY      No Known Allergies  Allergies as of 01/24/2024   No Known Allergies      Medication List        Accurate as of January 24, 2024 11:59 PM. If you have any questions, ask your nurse or doctor.          STOP taking these medications    Semaglutide(0.25 or 0.5MG /DOS) 2 MG/3ML Sopn Replaced by: Semaglutide (1 MG/DOSE) 4 MG/3ML Sopn Stopped by: Fallan Mccarey C Julitza Rickles       TAKE these medications    Accu-Chek Softclix Lancet Dev Kit 1 Device by Does not apply route in the morning, at noon, in the evening, and at bedtime.   amLODipine 10 MG tablet Commonly known as: NORVASC TAKE 1 TABLET BY MOUTH EVERY DAY   apixaban 5 MG Tabs tablet Commonly known as: ELIQUIS Take 1 tablet (5 mg total) by mouth 2 (two) times daily.   atorvastatin 40 MG tablet Commonly known as: LIPITOR Take 1 tablet (40 mg total) by mouth daily.   Continuous Glucose Monitor Sup Kit 1 Device by Does not apply route in the morning, at noon, in the evening, and at bedtime.   famotidine 20 MG tablet Commonly known as: PEPCID TAKE 1 TABLET BY MOUTH TWICE A DAY   Glucagon Emergency 1 MG Kit Inject 1 mg into the skin once as needed for up to 1 dose (low blood sugar).   insulin aspart 100 UNIT/ML FlexPen Commonly known as: NOVOLOG Inject 0-11 Units into the skin 3 (three) times daily with meals. Check sugar and inject per scale: BG <150= 0 unit; BG 150-200= 1 unit; BG 201-250= 3 unit; BG 251-300= 5 unit; BG 301-350= 7 unit; BG 351-400= 9 unit; BG >400= 11 unit and Call Provider.   insulin glargine 100 UNIT/ML Solostar Pen Commonly known as: LANTUS Inject 45 Units into the skin 2  (two) times daily. May substitute as needed per insurance. 45 units in a.m. and 35 units in p.m.   Lancets Misc 1 Device by Does not apply route 3 (three) times daily before meals. Uses assure platinum glucometer E11.9   losartan 100 MG tablet Commonly known as: COZAAR Take 1 tablet (100 mg total) by mouth daily.   Pen Needles 31G X 5 MM Misc 1 each by Does not apply route 3 (three) times daily. May dispense any manufacturer covered by patient's insurance.   polyethylene glycol 17 g packet Commonly known as: MIRALAX / GLYCOLAX Take 17 g by mouth daily as needed for moderate constipation.   Semaglutide (1 MG/DOSE) 4 MG/3ML Sopn Inject 1 mg as directed once a week. Replaces: Semaglutide(0.25 or 0.5MG /DOS) 2 MG/3ML Sopn Started by: [name] [name] Delshon Blanchfield  Review of Systems  Constitutional:  Negative for appetite change, chills, fatigue, fever and unexpected weight change.  HENT:  Negative for congestion, dental problem, ear discharge, ear pain, facial swelling, hearing loss, nosebleeds, postnasal drip, rhinorrhea, sinus pressure, sinus pain, sneezing, sore throat, tinnitus and trouble swallowing.   Eyes:  Negative for pain, discharge, redness, itching and visual disturbance.  Respiratory:  Negative for cough, chest tightness, shortness of breath and wheezing.   Cardiovascular:  Negative for chest pain, palpitations and leg swelling.  Gastrointestinal:  Negative for abdominal distention, abdominal pain, blood in stool, constipation, diarrhea, nausea and vomiting.  Endocrine: Negative for cold intolerance, heat intolerance, polydipsia, polyphagia and polyuria.  Genitourinary:  Negative for difficulty urinating, dysuria, flank pain, frequency and urgency.  Musculoskeletal:  Positive for gait problem. Negative for arthralgias, back pain, joint swelling, myalgias, neck pain and neck stiffness.  Skin:  Negative for color change, pallor, rash and wound.  Neurological:  Positive for weakness  and numbness. Negative for dizziness, syncope, speech difficulty, light-headedness and headaches.       Left hand weakness  Hematological:  Does not bruise/bleed easily.  Psychiatric/Behavioral:  Negative for agitation, behavioral problems, confusion, hallucinations, self-injury, sleep disturbance and suicidal ideas. The patient is not nervous/anxious.     Immunization History  Administered Date(s) Administered   Fluad Quad(high Dose 65+) 12/17/2021   Fluad Trivalent(High Dose 65+) 07/22/2023   Influenza,inj,Quad PF,6+ Mos 08/10/2015, 09/22/2016, 07/29/2017, 11/27/2018, 09/28/2019, 12/08/2020   Pneumococcal Conjugate-13 01/16/2019   Pneumococcal Polysaccharide-23 12/10/2016   Td 01/16/2019   Tdap 01/16/2019   Zoster Recombinant(Shingrix) 12/17/2021   Pertinent  Health Maintenance Due  Topic Date Due   Colonoscopy  01/23/2025 (Originally 03/24/2023)   HEMOGLOBIN A1C  07/26/2024   FOOT EXAM  01/23/2025   OPHTHALMOLOGY EXAM  01/24/2025   INFLUENZA VACCINE  Completed      01/19/2023    1:43 PM 07/22/2023   12:50 PM 08/22/2023    3:08 PM 09/27/2023    2:50 PM 01/24/2024    2:17 PM  Fall Risk  Falls in the past year? 0 0 0 0 0  Was there an injury with Fall? 0 0 0  0  Fall Risk Category Calculator 0 0 0  0  Patient at Risk for Falls Due to No Fall Risks No Fall Risks No Fall Risks  No Fall Risks  Fall risk Follow up  Falls evaluation completed Falls evaluation completed  Falls evaluation completed   Functional Status Survey:    Vitals:   01/24/24 1420  BP: 138/82  Pulse: 66  Resp: 17  Temp: 97.9 F (36.6 C)  SpO2: 95%  Weight: 294 lb 9.6 oz (133.6 kg)  Height: 6\' 2"  (1.88 m)   Body mass index is 37.82 kg/m. Physical Exam Physical Exam   VITALS: T- 97.9, P- 66, BP- 138/82, SaO2- 95% MEASUREMENTS: Weight- 294.6. GENERAL: Alert, cooperative, well developed, no acute distress. HEENT: Normocephalic, normal oropharynx, moist mucous membranes, ears normal, tympanic  membranes normal, no cerumen impaction, nose normal, no swelling. CHEST: Clear to auscultation bilaterally, no wheezes, rhonchi, or crackles. CARDIOVASCULAR: Normal heart rate and rhythm, S1 and S2 normal without murmurs. ABDOMEN: Soft, non-tender, non-distended, without organomegaly, normal bowel sounds. EXTREMITIES: No cyanosis or edema, pulses intact.left hand contracture NEUROLOGICAL: Cranial nerves grossly intact, Left hand contracture,unsteady gait, sensation intact in feet. SKIN: Intact  PSYCHIATRY/BEHAVIORAL: Mood stable  Labs reviewed: Recent Labs    07/22/23 1350 01/24/24 1501  NA 141 140  K 4.1 4.3  CL 104 103  CO2 27 27  GLUCOSE 123* 109*  BUN 20 25  CREATININE 1.04 1.08  CALCIUM 9.4 9.7   Recent Labs    07/22/23 1350 01/24/24 1501  AST 14 19  ALT 12 18  BILITOT 0.5 0.5  PROT 7.6 7.7   Recent Labs    07/22/23 1350 01/24/24 1501  WBC 5.1 4.4  NEUTROABS 2,790 2,200  HGB 13.8 13.2  HCT 42.3 39.8  MCV 87.6 86.9  PLT 134* 117*   Lab Results  Component Value Date   TSH 2.18 01/24/2024   Lab Results  Component Value Date   HGBA1C 7.8 (H) 01/24/2024   Lab Results  Component Value Date   CHOL 130 01/24/2024   HDL 53 01/24/2024   LDLCALC 61 01/24/2024   TRIG 76 01/24/2024   CHOLHDL 2.5 01/24/2024    Significant Diagnostic Results in last 30 days:  No results found.  Assessment/Plan  Obesity Significant weight gain from 271 lbs to 294.6 lbs since the last visit. No changes in diet or exercise routine reported. Previous success in weight loss with dietary modifications and exercise. Lack of sleep may contribute to weight gain and overall health issues. - Encourage regular exercise, such as walking - Review dietary intake and reduce calorie consumption - Consider referral to a nutritionist if lab results indicate elevated glucose or A1c - Address sleep hygiene to aid weight management  Type 2 Diabetes Mellitus Type 2 diabetes with previous A1c  reduction from 13.5% to 6.8%. Concern about potential A1c increase due to recent weight gain. Currently on insulin therapy and Ozempic, with no reported side effects from Ozempic. Dietary habits, including bread and cereal intake, impact blood sugar management. - Increase Ozempic to 1 mg weekly - Order A1c test to assess current diabetes control - Continue insulin regimen as prescribed - Encourage dietary modifications to manage blood sugar levels  Hypertension Hypertension with current blood pressure reading of 138/82 mmHg. On losartan and amlodipine for management. Weight gain may necessitate medication adjustment. - Consider increasing losartan to 1 mg if weight gain affects blood pressure control - Continue current antihypertensive medications  Hyperlipidemia Hyperlipidemia with triglycerides previously at 175 mg/dL. On atorvastatin with no reported side effects. Dietary habits, including bread and cereal intake, may contribute to elevated triglycerides. - Order lipid panel to reassess cholesterol levels - Encourage dietary modifications to reduce triglyceride levels  Anemia Improvement in hemoglobin levels from 10.1 to 13.8. Monitoring required to ensure stability. - Order CBC to monitor hemoglobin levels  Thrombocytopenia Low platelet count of 134, requiring monitoring for improvement. - Order CBC to reassess platelet count  Hx of Stroke History of stroke with residual left hand weakness and contracture. No current pain reported. Uses a cane for stability when outside. - Continue current management and use of cane for stability  Depression Depression reported as well-managed. - Continue current management  Gastroesophageal Reflux Disease (GERD) GERD managed with Pepcid. No current symptoms reported. - Continue Pepcid 20 mg twice daily  General Health Maintenance Due for a Medicare visit and a pneumonia vaccine. Previous pneumonia vaccines received. - Schedule Medicare  visit - Administer pneumonia 20 vaccine at next visit  Follow-up Follow-up plans for lab work and future appointments. - Order lab work including cholesterol, thyroid, chemistry, CBC, and A1c - Schedule six-month follow-up visit - Ensure follow-up with eye doctor as planned   Family/ staff Communication: Reviewed plan of care with patient verbalized understanding   Labs/tests ordered:  - CBC  with Differential/Platelet - CMP with eGFR([facility]) - TSH - Hgb A1C - Lipid panel  Next Appointment : Return in about 6 months (around 07/26/2024) for medical mangement of chronic issues.Marland Kitchen   Spent 40 minutes of Face to face and non-face to face with patient  >50% time spent counseling; reviewing medical record; tests; labs; documentation and developing future plan of care.   Caesar Bookman, NP

## 2024-02-18 ENCOUNTER — Other Ambulatory Visit: Payer: Self-pay | Admitting: Family

## 2024-02-18 DIAGNOSIS — I10 Essential (primary) hypertension: Secondary | ICD-10-CM

## 2024-02-18 DIAGNOSIS — E1169 Type 2 diabetes mellitus with other specified complication: Secondary | ICD-10-CM

## 2024-02-18 DIAGNOSIS — E1149 Type 2 diabetes mellitus with other diabetic neurological complication: Secondary | ICD-10-CM

## 2024-02-18 DIAGNOSIS — E119 Type 2 diabetes mellitus without complications: Secondary | ICD-10-CM

## 2024-03-05 ENCOUNTER — Ambulatory Visit (INDEPENDENT_AMBULATORY_CARE_PROVIDER_SITE_OTHER): Payer: Medicare HMO | Admitting: Podiatry

## 2024-03-05 ENCOUNTER — Encounter: Payer: Self-pay | Admitting: Podiatry

## 2024-03-05 DIAGNOSIS — M79675 Pain in left toe(s): Secondary | ICD-10-CM

## 2024-03-05 DIAGNOSIS — M79674 Pain in right toe(s): Secondary | ICD-10-CM | POA: Diagnosis not present

## 2024-03-05 DIAGNOSIS — E1149 Type 2 diabetes mellitus with other diabetic neurological complication: Secondary | ICD-10-CM | POA: Diagnosis not present

## 2024-03-05 DIAGNOSIS — B351 Tinea unguium: Secondary | ICD-10-CM | POA: Diagnosis not present

## 2024-03-05 NOTE — Progress Notes (Signed)
 This patient returns to my office for at risk foot care.  This patient requires this care by a professional since this patient will be at risk due to having coagulation defect, DM, CKD < CVA and dementia.This patient is unable to cut nails himself since the patient cannot reach his nails.These nails are painful walking and wearing shoes.  This patient presents for at risk foot care today.  General Appearance  Alert, conversant and in no acute stress.  Vascular  Dorsalis pedis and posterior tibial  pulses are  not palpable  bilaterally.  Capillary return is within normal limits  bilaterally. Temperature is within normal limits  bilaterally.  Neurologic  deferred due to CVA.  Nails Thick disfigured discolored nails with subungual debris  from hallux to fifth toes bilaterally. No evidence of bacterial infection or drainage bilaterally.  Orthopedic  No limitations of motion  feet .  No crepitus or effusions noted.  No bony pathology or digital deformities noted.  Skin  normotropic skin with no porokeratosis noted bilaterally.  No signs of infections or ulcers noted.     Onychomycosis  Pain in right toes  Pain in left toes  Consent was obtained for treatment procedures.   Mechanical debridement of nails 1-5  bilaterally performed with a nail nipper.  Filed with dremel without incident.    Return office visit   3 months                   Told patient to return for periodic foot care and evaluation due to potential at risk complications.   Helane Gunther DPM

## 2024-03-29 ENCOUNTER — Telehealth: Payer: Self-pay | Admitting: *Deleted

## 2024-03-29 NOTE — Telephone Encounter (Signed)
 Selle with [employer] called confirming that patient has Dx of Diabetes. Stated that she needed a verbal of Diagnosis.   Verbal given.

## 2024-04-27 NOTE — Telephone Encounter (Signed)
 This encounter was created in error - please disregard.

## 2024-04-30 ENCOUNTER — Other Ambulatory Visit: Payer: Self-pay | Admitting: *Deleted

## 2024-04-30 DIAGNOSIS — I10 Essential (primary) hypertension: Secondary | ICD-10-CM

## 2024-04-30 DIAGNOSIS — E119 Type 2 diabetes mellitus without complications: Secondary | ICD-10-CM

## 2024-04-30 MED ORDER — LOSARTAN POTASSIUM 100 MG PO TABS
100.0000 mg | ORAL_TABLET | Freq: Every day | ORAL | 1 refills | Status: DC
Start: 2024-04-30 — End: 2024-09-24

## 2024-04-30 MED ORDER — FAMOTIDINE 20 MG PO TABS: 20.0000 mg | ORAL_TABLET | Freq: Two times a day (BID) | ORAL | 1 refills | Status: DC

## 2024-04-30 NOTE — Telephone Encounter (Signed)
 Received fax from [facility] requesting refills for patient.

## 2024-05-08 NOTE — Telephone Encounter (Unsigned)
 Copied from CRM [id number]. Topic: Clinical - Medication Refill >> May 08, 2024  8:06 AM Suzette B wrote: Medication:  amLODipine  (NORVASC ) 10 MG tablet atorvastatin  (LIPITOR ) 40 MG tablet apixaban  (ELIQUIS ) 5 MG TABS tablet Has the patient contacted their pharmacy? No  [facility] called in to request the refill for the patient.     [facility] - [place], [place] - [address] [place] [address] Phone: [phone] Fax: [phone]  Is this the correct pharmacy for this prescription? Yes If no, delete pharmacy and type the correct one.   Has the prescription been filled recently? Yes  Is the patient out of the medication? No  Has the patient been seen for an appointment in the last year OR does the patient have an upcoming appointment? Yes  Can we respond through MyChart? No  Agent: Please be advised that Rx refills may take up to 3 business days. We ask that you follow-up with your pharmacy.

## 2024-05-16 ENCOUNTER — Other Ambulatory Visit: Payer: Self-pay | Admitting: Family

## 2024-05-26 ENCOUNTER — Other Ambulatory Visit: Payer: Self-pay | Admitting: Family

## 2024-06-04 ENCOUNTER — Encounter: Payer: Self-pay | Admitting: Podiatry

## 2024-06-04 ENCOUNTER — Ambulatory Visit (INDEPENDENT_AMBULATORY_CARE_PROVIDER_SITE_OTHER): Admitting: Podiatry

## 2024-06-04 DIAGNOSIS — M79674 Pain in right toe(s): Secondary | ICD-10-CM

## 2024-06-04 DIAGNOSIS — E1149 Type 2 diabetes mellitus with other diabetic neurological complication: Secondary | ICD-10-CM | POA: Diagnosis not present

## 2024-06-04 DIAGNOSIS — B351 Tinea unguium: Secondary | ICD-10-CM | POA: Diagnosis not present

## 2024-06-04 DIAGNOSIS — M79675 Pain in left toe(s): Secondary | ICD-10-CM | POA: Diagnosis not present

## 2024-06-04 NOTE — Progress Notes (Signed)
 This patient returns to my office for at risk foot care.  This patient requires this care by a professional since this patient will be at risk due to having coagulation defect, DM, CKD < CVA and dementia.This patient is unable to cut nails himself since the patient cannot reach his nails.These nails are painful walking and wearing shoes.  This patient presents for at risk foot care today.  General Appearance  Alert, conversant and in no acute stress.  Vascular  Dorsalis pedis and posterior tibial  pulses are  not palpable  bilaterally.  Capillary return is within normal limits  bilaterally. Temperature is within normal limits  bilaterally.  Neurologic  deferred due to CVA.  Nails Thick disfigured discolored nails with subungual debris  from hallux to fifth toes bilaterally. No evidence of bacterial infection or drainage bilaterally.  Orthopedic  No limitations of motion  feet .  No crepitus or effusions noted.  No bony pathology or digital deformities noted.  Skin  normotropic skin with no porokeratosis noted bilaterally.  No signs of infections or ulcers noted.     Onychomycosis  Pain in right toes  Pain in left toes  Consent was obtained for treatment procedures.   Mechanical debridement of nails 1-5  bilaterally performed with a nail nipper.  Filed with dremel without incident.    Return office visit   3 months                   Told patient to return for periodic foot care and evaluation due to potential at risk complications.   Helane Gunther DPM

## 2024-06-24 ENCOUNTER — Other Ambulatory Visit: Payer: Self-pay | Admitting: Family

## 2024-06-25 NOTE — Telephone Encounter (Signed)
 High risk warning

## 2024-07-06 ENCOUNTER — Other Ambulatory Visit: Payer: Self-pay

## 2024-07-06 DIAGNOSIS — E1169 Type 2 diabetes mellitus with other specified complication: Secondary | ICD-10-CM

## 2024-07-06 DIAGNOSIS — E1149 Type 2 diabetes mellitus with other diabetic neurological complication: Secondary | ICD-10-CM

## 2024-07-06 DIAGNOSIS — I10 Essential (primary) hypertension: Secondary | ICD-10-CM

## 2024-07-06 MED ORDER — APIXABAN 5 MG PO TABS: 5.0000 mg | ORAL_TABLET | Freq: Two times a day (BID) | ORAL | 1 refills | Status: DC

## 2024-07-06 MED ORDER — ATORVASTATIN CALCIUM 40 MG PO TABS: 40.0000 mg | ORAL_TABLET | Freq: Every day | ORAL | 1 refills | Status: DC

## 2024-07-06 NOTE — Telephone Encounter (Signed)
 PA form for Ozempic  completed and placed in outgoing fax box.

## 2024-07-06 NOTE — Telephone Encounter (Signed)
 Patient's pharmacy has sent a PA for Ozempic . Key : Elsie Hurst (Key: [id number])

## 2024-07-06 NOTE — Telephone Encounter (Signed)
 PA came through for ozempic  from [employer].  PA was unable to be sent to plan due to prior authorization being approved with [facility] up until Dec 2025. Spoke with the prior authorization tem [phone] regarding not being able to submit the PA and was stated that due to the PA already being approved with [facility] the claim hasn't been released to do an approval with [facility]. PA team stated patient would have to wait until the claim is released from [facility] which isn't until Nov 08 2024. Patient would have to continue to receiving medication from [facility] regarding patients ozempic .   [facility] has sent over an action plan needed for PA. Paper is filled out and needs patient signature to be faxed back over. This is only if you would like to prescribe patients ozempic  through [facility]. Paper will be places in patients pcp Sign and review folder.

## 2024-07-24 ENCOUNTER — Telehealth: Payer: Self-pay

## 2024-07-24 NOTE — Telephone Encounter (Signed)
   Incoming fax received from patients pharmacy to initiate a prior authorization for                   .  PA initiated through covermymeds. Key: [id number]  I WAS UNABLE to proceed with PA       I called patients local pharmacy and left a detailed message informing them of the above and asked that they call or fax to let us  know if there is any action required on our end to aid in patient getting his ozempic  rx filled.

## 2024-07-26 ENCOUNTER — Encounter: Payer: Self-pay | Admitting: Family

## 2024-07-26 ENCOUNTER — Ambulatory Visit (INDEPENDENT_AMBULATORY_CARE_PROVIDER_SITE_OTHER): Admitting: Family

## 2024-07-26 VITALS — BP 130/80 | HR 74 | Temp 98.0°F | Resp 20 | Ht 74.0 in | Wt 294.5 lb

## 2024-07-26 DIAGNOSIS — Z23 Encounter for immunization: Secondary | ICD-10-CM | POA: Diagnosis not present

## 2024-07-26 DIAGNOSIS — I6930 Unspecified sequelae of cerebral infarction: Secondary | ICD-10-CM

## 2024-07-26 DIAGNOSIS — E119 Type 2 diabetes mellitus without complications: Secondary | ICD-10-CM | POA: Diagnosis not present

## 2024-07-26 DIAGNOSIS — E1169 Type 2 diabetes mellitus with other specified complication: Secondary | ICD-10-CM | POA: Diagnosis not present

## 2024-07-26 DIAGNOSIS — M7989 Other specified soft tissue disorders: Secondary | ICD-10-CM

## 2024-07-26 DIAGNOSIS — E1149 Type 2 diabetes mellitus with other diabetic neurological complication: Secondary | ICD-10-CM

## 2024-07-26 DIAGNOSIS — E785 Hyperlipidemia, unspecified: Secondary | ICD-10-CM | POA: Diagnosis not present

## 2024-07-26 DIAGNOSIS — K219 Gastro-esophageal reflux disease without esophagitis: Secondary | ICD-10-CM | POA: Diagnosis not present

## 2024-07-26 DIAGNOSIS — I10 Essential (primary) hypertension: Secondary | ICD-10-CM

## 2024-07-26 DIAGNOSIS — F321 Major depressive disorder, single episode, moderate: Secondary | ICD-10-CM | POA: Diagnosis not present

## 2024-07-26 MED ORDER — ATORVASTATIN CALCIUM 40 MG PO TABS: 40.0000 mg | ORAL_TABLET | Freq: Every day | ORAL | 1 refills | Status: AC

## 2024-07-26 MED ORDER — SEMAGLUTIDE (2 MG/DOSE) 8 MG/3ML ~~LOC~~ SOPN: 2.0000 mg | PEN_INJECTOR | SUBCUTANEOUS | 3 refills | Status: AC

## 2024-07-26 MED ORDER — AMLODIPINE BESYLATE 10 MG PO TABS: 10.0000 mg | ORAL_TABLET | Freq: Every day | ORAL | 1 refills | Status: DC

## 2024-07-26 NOTE — Patient Instructions (Signed)
 1.Go to local pharmacy to receive Shingles vaccine. 2.Stop at check out and schedule Annual Wellness Visit.

## 2024-07-26 NOTE — Progress Notes (Signed)
 Provider: Roxan Plough FNP-C   Linet Brash, Roxan BROCKS, NP  Patient Care Team: Zabdi Mis, Roxan BROCKS, NP as PCP - General (Family Medicine) Center, [facility] ([facility]illed Nursing Facility) [facility]  Extended Emergency Contact Information Primary Emergency Contact: [name],Nicole Address: [address]          [place], [place] [address] [place] Mobile Phone: [phone] Relation: Daughter Secondary Emergency Contact: Grammatico,Yavonda Mobile Phone: [phone] Relation: Daughter  Code Status:  Full Code  Goals of care: Advanced Directive information    07/26/2024    1:54 PM  Advanced Directives  Does Patient Have a Medical Advance Directive? No  Would patient like information on creating a medical advance directive? No - Patient declined     Chief Complaint  Patient presents with   Medical Management of Chronic Issues    6 Month follow up.    Discussed the use of AI scribe software for clinical note transcription with the patient, who gave verbal consent to proceed.  History of Present Illness   [name] [name]. is a 63 year old male with diabetes and hypertension who presents for a six-month follow-up visit.  He has been monitoring his blood sugar levels at home, which have been around 150 mg/dL. His home blood pressure readings have been in the 140s. He has lost one pound since his last visit, now weighing 294 pounds, with a BMI of 37.81.  He is currently on insulin  therapy, taking Novolog  one unit three times a day depending on blood sugar levels, and Lantus  45 units twice daily. He also uses glucagon  as needed for hypoglycemic episodes. He is on losartan  100 mg daily and amlodipine  10 mg daily for hypertension. For gastroesophageal reflux disease, he takes famotidine  20 mg twice daily. He is on Eliquis  5 mg twice daily for a history of stroke and atorvastatin  40 mg daily for hyperlipidemia. He uses Miralax  as needed for bowel regularity,  which is currently regular.  He reports swelling in his left leg for about a week, which is not painful. He has a history of surgery on this leg. He has been taking Eliquis  twice daily without missing doses. No shortness of breath or chest pain. He has not seen a cardiologist recently. He has not visited an ophthalmologist recently but has seen a podiatrist for toenail trimming.  No family history of thyroid cancer. No symptoms of depression. He reports frequent urination but denies dysuria. He has not been engaging in regular physical exercise recently. He [place] the [employer] service for transportation.   Past Medical History:  Diagnosis Date   Acute respiratory failure (HCC)    Cerebral edema (HCC)    Diabetes mellitus without complication (HCC)    diet controlled- no meds   Dysphagia as late effect of cerebrovascular disease    Fall at home    Hemiparesis affecting left side as late effect of stroke (HCC)    Hyperlipidemia    Hypertension    Stroke (HCC) 2016   Tuberculosis    11 yrs ago per pt.- treated with meds per pt.   Past Surgical History:  Procedure Laterality Date   CHOLECYSTECTOMY N/A 11/30/2022   Procedure: LAPAROSCOPIC CHOLECYSTECTOMY WITH INTRAOPERATIVE CHOLANGIOGRAM;  Surgeon: Vanderbilt Ned, MD;  Location: [facility] OR;  Service: General;  Laterality: N/A;   COLONOSCOPY  10/06/2016   ESOPHAGOGASTRODUODENOSCOPY N/A 08/12/2015   Procedure: ESOPHAGOGASTRODUODENOSCOPY (EGD);  Surgeon: Gwendlyn ONEIDA Buddy, MD;  Location: [facility];  Service: Endoscopy;  Laterality: N/A;   feeding tube removed     GASTROSTOMY TUBE PLACEMENT  05-19-15   mechanical thrombectomy angioplasty stenet placement     SKIN GRAFT  1980's   ankle   UPPER GASTROINTESTINAL ENDOSCOPY      No Known Allergies  Allergies as of 07/26/2024   No Known Allergies      Medication List        Accurate as of July 26, 2024  3:07 PM. If you have any questions, ask your nurse or doctor.          STOP  taking these medications    Semaglutide  (1 MG/DOSE) 4 MG/3ML Sopn Replaced by: Semaglutide  (2 MG/DOSE) 8 MG/3ML Sopn Stopped by: Hoda Hon C Horace Lukas       TAKE these medications    Accu-Chek Softclix Lancet Dev Kit 1 Device by Does not apply route in the morning, at noon, in the evening, and at bedtime.   amLODipine  10 MG tablet Commonly known as: NORVASC  Take 1 tablet (10 mg total) by mouth daily.   apixaban  5 MG Tabs tablet Commonly known as: ELIQUIS  Take 1 tablet (5 mg total) by mouth 2 (two) times daily.   atorvastatin  40 MG tablet Commonly known as: LIPITOR  Take 1 tablet (40 mg total) by mouth daily.   Continuous Glucose Monitor Sup Kit 1 Device by Does not apply route in the morning, at noon, in the evening, and at bedtime.   famotidine  20 MG tablet Commonly known as: PEPCID  TAKE 1 TABLET BY MOUTH TWICE A DAY   Glucagon  Emergency 1 MG Kit Inject 1 mg into the skin once as needed for up to 1 dose (low blood sugar).   insulin  aspart 100 UNIT/ML FlexPen Commonly known as: NOVOLOG  Inject 0-11 Units into the skin 3 (three) times daily with meals. Check sugar and inject per scale: BG <150= 0 unit; BG 150-200= 1 unit; BG 201-250= 3 unit; BG 251-300= 5 unit; BG 301-350= 7 unit; BG 351-400= 9 unit; BG >400= 11 unit and Call Provider.   insulin  glargine 100 UNIT/ML Solostar Pen Commonly known as: LANTUS  Inject 45 Units into the skin 2 (two) times daily. May substitute as needed per insurance. 45 units in a.m. and 35 units in p.m.   Lancets Misc 1 Device by Does not apply route 3 (three) times daily before meals. Uses assure platinum glucometer E11.9   losartan  100 MG tablet Commonly known as: COZAAR  Take 1 tablet (100 mg total) by mouth daily.   Pen Needles 31G X 5 MM Misc 1 each by Does not apply route 3 (three) times daily. May dispense any manufacturer covered by patient's insurance.   polyethylene glycol 17 g packet Commonly known as: MIRALAX  / GLYCOLAX  Take 17 g  by mouth daily as needed for moderate constipation.   Semaglutide  (2 MG/DOSE) 8 MG/3ML Sopn Inject 2 mg as directed once a week. Replaces: Semaglutide  (1 MG/DOSE) 4 MG/3ML Sopn Started by: Roxan BROCKS Macarius Ruark        Review of Systems  Constitutional:  Negative for appetite change, chills, fatigue, fever and unexpected weight change.  HENT:  Negative for congestion, dental problem, ear discharge, ear pain, facial swelling, hearing loss, nosebleeds, postnasal drip, rhinorrhea, sinus pressure, sinus pain, sneezing, sore throat, tinnitus and trouble swallowing.   Eyes:  Negative for pain, discharge, redness, itching and visual disturbance.  Respiratory:  Negative for cough, chest tightness, shortness of breath and wheezing.   Cardiovascular:  Positive for leg swelling. Negative for chest  pain and palpitations.       Left leg swelling   Gastrointestinal:  Negative for abdominal distention, abdominal pain, blood in stool, constipation, diarrhea, nausea and vomiting.  Endocrine: Negative for cold intolerance, heat intolerance, polydipsia, polyphagia and polyuria.  Genitourinary:  Negative for difficulty urinating, dysuria, flank pain, frequency and urgency.  Musculoskeletal:  Positive for gait problem. Negative for arthralgias, back pain, joint swelling, myalgias, neck pain and neck stiffness.  Skin:  Negative for color change, pallor, rash and wound.  Neurological:  Positive for weakness and numbness. Negative for dizziness, syncope, light-headedness and headaches.       Chronic Left side weakness and speech impairment from previous stroke   Hematological:  Does not bruise/bleed easily.  Psychiatric/Behavioral:  Negative for agitation, behavioral problems, confusion, hallucinations, self-injury, sleep disturbance and suicidal ideas. The patient is not nervous/anxious.     Immunization History  Administered Date(s) Administered   Fluad Quad(high Dose 65+) 12/17/2021   Fluad Trivalent(High Dose  65+) 07/22/2023   INFLUENZA, HIGH DOSE SEASONAL PF 07/26/2024   Influenza,inj,Quad PF,6+ Mos 08/10/2015, 09/22/2016, 07/29/2017, 11/27/2018, 09/28/2019, 12/08/2020   Pneumococcal Conjugate-13 01/16/2019   Pneumococcal Polysaccharide-23 12/10/2016   Td 01/16/2019   Tdap 01/16/2019   Zoster Recombinant(Shingrix) 12/17/2021   Pertinent  Health Maintenance Due  Topic Date Due   HEMOGLOBIN A1C  07/26/2024   Colonoscopy  01/23/2025 (Originally 03/24/2023)   FOOT EXAM  01/23/2025   OPHTHALMOLOGY EXAM  01/24/2025   Influenza Vaccine  Completed      07/22/2023   12:50 PM 08/22/2023    3:08 PM 09/27/2023    2:50 PM 01/24/2024    2:17 PM 07/26/2024    1:53 PM  Fall Risk  Falls in the past year? 0 0 0 0 0  Was there an injury with Fall? 0 0  0 0  Fall Risk Category Calculator 0 0  0 0  Patient at Risk for Falls Due to No Fall Risks No Fall Risks  No Fall Risks No Fall Risks  Fall risk Follow up Falls evaluation completed Falls evaluation completed  Falls evaluation completed Falls evaluation completed   Functional Status Survey:    Vitals:   07/26/24 1358  BP: 130/80  Pulse: 74  Resp: 20  Temp: 98 F (36.7 C)  SpO2: 93%  Weight: 294 lb 8 oz (133.6 kg)  Height: 6' 2 (1.88 m)   Body mass index is 37.81 kg/m. Physical Exam VITALS: T- 98.0, P- 74, BP- 130/80, SaO2- 93% MEASUREMENTS: Weight- 294, BMI- 37.81. GENERAL: Alert, cooperative, well developed, no acute distress HEENT: Normocephalic, normal oropharynx, moist mucous membranes, ears normal bilaterally, nose normal, no sinus tenderness NECK: Neck normal CHEST: Clear to auscultation bilaterally, no wheezes, rhonchi, or crackles CARDIOVASCULAR: Normal heart rate and rhythm, S1 and S2 normal without murmurs ABDOMEN: Soft, non-tender, non-distended, without organomegaly, normal bowel sounds EXTREMITIES: No cyanosis, left leg swollen, no redness or heat, left calf 52 cm, right calf 47 cm NEUROLOGICAL: Cranial nerves grossly  intact, left hand contracture and left leg weakness  SKIN: No rash,no lesion or erythema   PSYCHIATRY/BEHAVIORAL: Mood stable     Labs reviewed: Recent Labs    01/24/24 1501  NA 140  K 4.3  CL 103  CO2 27  GLUCOSE 109*  BUN 25  CREATININE 1.08  CALCIUM  9.7   Recent Labs    01/24/24 1501  AST 19  ALT 18  BILITOT 0.5  PROT 7.7   Recent Labs    01/24/24 1501  WBC 4.4  NEUTROABS 2,200  HGB 13.2  HCT 39.8  MCV 86.9  PLT 117*   Lab Results  Component Value Date   TSH 2.18 01/24/2024   Lab Results  Component Value Date   HGBA1C 7.8 (H) 01/24/2024   Lab Results  Component Value Date   CHOL 130 01/24/2024   HDL 53 01/24/2024   LDLCALC 61 01/24/2024   TRIG 76 01/24/2024   CHOLHDL 2.5 01/24/2024    Significant Diagnostic Results in last 30 days:  No results found.  Assessment/Plan  Type 2 diabetes mellitus with poor glycemic control and obesity Blood sugar levels remain elevated at 150 mg/dL. BMI is 37.81, indicating obesity. - Initiate Ozempic  pending insurance approval - Continue current insulin  regimen - Monitor for side effects of Ozempic  - Follow up in one month to assess response to Ozempic  - Encourage daily exercise, such as walking for 20-30 minutes - Advise moderation in consumption of high-calorie fruits like bananas and watermelon  Essential hypertension Blood pressure readings at home are in the 140s, above the target of less than 130/80 mmHg for patients with diabetes. - Continue losartan  100 mg daily - Continue amlodipine  10 mg daily - Monitor blood pressure regularly at home  Chronic left lower extremity edema, rule out deep vein thrombosis Left lower extremity edema with a circumference of 52 cm compared to 47 cm on the right. No redness or heat, but significant swelling. History of surgery on the affected leg. On Eliquis  for anticoagulation due to history of stroke. Differential includes deep vein thrombosis. - Order Doppler ultrasound  to rule out deep vein thrombosis - Advise elevating the leg when sitting - Consider compression stockings if no blood clot is found  Hx  of  Stroke with left side weakness  On Eliquis  for anticoagulation. No recent follow-up with cardiology. No new neurological symptoms reported. - Continue Eliquis  5 mg twice daily - Consider cardiology follow-up  Gastroesophageal reflux disease (GERD) GERD is managed with famotidine . No new symptoms reported. - Continue famotidine  20 mg twice daily  Hyperlipidemia goal < 100 Dyslipidemia is managed with atorvastatin . No new symptoms reported. - Continue atorvastatin  40 mg daily  Morbid Obesity  - BMI 37.81 with associated comorbidity HTN,HLD,GERD,T2DM and hx of stroke   Depression Depression is well controlled with no current symptoms reported.   Family/ staff Communication: Reviewed plan of care with patient verbalized understanding   Labs/tests ordered:  - CBC with Differential/Platelet - CMP with eGFR([facility]) - TSH - Hgb A1C - Lipid panel - Urine Microalbumin Creatinine ratio   Next Appointment : Return in about 6 months (around 01/23/2025) for medical mangement of chronic issues., Annual wellness visit soon.1 month for T2DM.   Spent 30 minutes of Face to face and non-face to face with patient  >50% time spent counseling; reviewing medical record; tests; labs; documentation and developing future plan of care.   Roxan JAYSON Plough, NP

## 2024-07-27 ENCOUNTER — Ambulatory Visit: Payer: Self-pay | Admitting: Family

## 2024-07-27 LAB — CBC WITH DIFFERENTIAL/PLATELET
Absolute Lymphocytes: 1617 {cells}/uL (ref 850–3900)
Absolute Monocytes: 473 {cells}/uL (ref 200–950)
Basophils Absolute: 30 {cells}/uL (ref 0–200)
Basophils Relative: 0.7 %
Eosinophils Absolute: 99 {cells}/uL (ref 15–500)
Eosinophils Relative: 2.3 %
HCT: 40.6 % (ref 38.5–50.0)
Hemoglobin: 13.5 g/dL (ref 13.2–17.1)
MCH: 29 pg (ref 27.0–33.0)
MCHC: 33.3 g/dL (ref 32.0–36.0)
MCV: 87.1 fL (ref 80.0–100.0)
MPV: 13.4 fL — ABNORMAL HIGH (ref 7.5–12.5)
Monocytes Relative: 11 %
Neutro Abs: 2081 {cells}/uL (ref 1500–7800)
Neutrophils Relative %: 48.4 %
Platelets: 116 Thousand/uL — ABNORMAL LOW (ref 140–400)
RBC: 4.66 Million/uL (ref 4.20–5.80)
RDW: 15 % (ref 11.0–15.0)
Total Lymphocyte: 37.6 %
WBC: 4.3 Thousand/uL (ref 3.8–10.8)

## 2024-07-27 LAB — COMPREHENSIVE METABOLIC PANEL WITH GFR
AG Ratio: 1.5 (calc) (ref 1.0–2.5)
ALT: 16 U/L (ref 9–46)
AST: 17 U/L (ref 10–35)
Albumin: 4.5 g/dL (ref 3.6–5.1)
Alkaline phosphatase (APISO): 88 U/L (ref 35–144)
BUN: 18 mg/dL (ref 7–25)
CO2: 27 mmol/L (ref 20–32)
Calcium: 9.6 mg/dL (ref 8.6–10.3)
Chloride: 104 mmol/L (ref 98–110)
Creat: 1.23 mg/dL (ref 0.70–1.35)
Globulin: 3.1 g/dL (ref 1.9–3.7)
Glucose, Bld: 100 mg/dL — ABNORMAL HIGH (ref 65–99)
Potassium: 3.7 mmol/L (ref 3.5–5.3)
Sodium: 140 mmol/L (ref 135–146)
Total Bilirubin: 0.6 mg/dL (ref 0.2–1.2)
Total Protein: 7.6 g/dL (ref 6.1–8.1)
eGFR: 65 mL/min/1.73m2 (ref >=60)

## 2024-07-27 LAB — MICROALBUMIN / CREATININE URINE RATIO
Creatinine, Urine: 204 mg/dL (ref 20–320)
Microalb Creat Ratio: 46 mg/g{creat} — ABNORMAL HIGH (ref <30)
Microalb, Ur: 9.4 mg/dL

## 2024-07-27 LAB — HEMOGLOBIN A1C
Hgb A1c MFr Bld: 6.6 % — ABNORMAL HIGH (ref <5.7)
Mean Plasma Glucose: 143 mg/dL
eAG (mmol/L): 7.9 mmol/L

## 2024-07-27 LAB — LIPID PANEL
Cholesterol: 128 mg/dL (ref <200)
HDL: 52 mg/dL (ref >=40)
LDL Cholesterol (Calc): 59 mg/dL
Non-HDL Cholesterol (Calc): 76 mg/dL (ref <130)
Total CHOL/HDL Ratio: 2.5 (calc) (ref <5.0)
Triglycerides: 86 mg/dL (ref <150)

## 2024-07-27 LAB — TSH: TSH: 2 m[IU]/L (ref 0.40–4.50)

## 2024-07-30 ENCOUNTER — Ambulatory Visit (HOSPITAL_COMMUNITY)
Admission: RE | Admit: 2024-07-30 | Discharge: 2024-07-30 | Disposition: A | Discharge status: Home / Self Care | Source: Ambulatory Visit | Attending: Family | Admitting: Family

## 2024-07-30 DIAGNOSIS — M7989 Other specified soft tissue disorders: Secondary | ICD-10-CM | POA: Diagnosis not present

## 2024-08-07 NOTE — Progress Notes (Signed)
 [name] [name].                                          MRN: [id number]   08/07/2024   The [facility] Quality Team Specialist reviewed this patient medical record for the purposes of chart review for care gap closure. The following were reviewed: chart review for care gap closure-kidney health evaluation for diabetes:eGFR  and uACR.REPORT WAS ABSTRACTED    [facility] Quality Team

## 2024-08-29 ENCOUNTER — Encounter: Payer: Self-pay | Admitting: Family

## 2024-08-31 ENCOUNTER — Encounter: Payer: Self-pay | Admitting: Family

## 2024-08-31 ENCOUNTER — Ambulatory Visit (INDEPENDENT_AMBULATORY_CARE_PROVIDER_SITE_OTHER): Admitting: Family

## 2024-08-31 VITALS — BP 160/88 | HR 83 | Temp 98.4°F | Ht 74.0 in | Wt 288.0 lb

## 2024-08-31 DIAGNOSIS — Z1211 Encounter for screening for malignant neoplasm of colon: Secondary | ICD-10-CM | POA: Diagnosis not present

## 2024-08-31 DIAGNOSIS — Z87891 Personal history of nicotine dependence: Secondary | ICD-10-CM | POA: Diagnosis not present

## 2024-08-31 DIAGNOSIS — Z0000 Encounter for general adult medical examination without abnormal findings: Secondary | ICD-10-CM | POA: Diagnosis not present

## 2024-08-31 NOTE — Progress Notes (Signed)
 Subjective:   [name] [name]. is a 63 y.o. male who presents for Medicare Annual/Subsequent preventive examination.  Visit Complete: In person  Patient Medicare AWV questionnaire was completed by the patient on 08/31/2024; I have confirmed that all information answered by patient is correct and no changes since this date.  Cardiac Risk Factors include: advanced age (>69men, >31 women);hypertension;male gender;obesity (BMI >30kg/m2);smoking/ tobacco exposure;diabetes mellitus;family history of premature cardiovascular disease     Objective:    Today's Vitals   08/31/24 1130 08/31/24 1434 08/31/24 1456  BP: (!) 160/88 (!) 160/88   Pulse: 83    Temp: 98.4 F (36.9 C)    SpO2: 96%    Weight: 288 lb (130.6 kg)    Height: 6' 2 (1.88 m)    PainSc:   9    Body mass index is 36.98 kg/m.     07/26/2024    1:54 PM 01/24/2024    2:18 PM 09/27/2023    2:50 PM 07/22/2023   12:50 PM 11/04/2022    8:45 AM 06/17/2022    9:05 AM 12/17/2021   10:05 AM  Advanced Directives  Does Patient Have a Medical Advance Directive? No No No No No No No  Would patient like information on creating a medical advance directive? No - Patient declined No - Patient declined No - Patient declined No - Patient declined No - Patient declined No - Patient declined No - Patient declined    Current Medications (verified) Outpatient Encounter Medications as of 08/31/2024  Medication Sig   amLODipine  (NORVASC ) 10 MG tablet Take 1 tablet (10 mg total) by mouth daily.   apixaban  (ELIQUIS ) 5 MG TABS tablet Take 1 tablet (5 mg total) by mouth 2 (two) times daily.   atorvastatin  (LIPITOR ) 40 MG tablet Take 1 tablet (40 mg total) by mouth daily.   Continuous Glucose Monitor Sup KIT 1 Device by Does not apply route in the morning, at noon, in the evening, and at bedtime.   famotidine  (PEPCID ) 20 MG tablet TAKE 1 TABLET BY MOUTH TWICE A DAY   Glucagon , rDNA, (GLUCAGON  EMERGENCY) 1 MG KIT Inject 1 mg into the skin once as  needed for up to 1 dose (low blood sugar).   insulin  aspart (NOVOLOG ) 100 UNIT/ML FlexPen Inject 0-11 Units into the skin 3 (three) times daily with meals. Check sugar and inject per scale: BG <150= 0 unit; BG 150-200= 1 unit; BG 201-250= 3 unit; BG 251-300= 5 unit; BG 301-350= 7 unit; BG 351-400= 9 unit; BG >400= 11 unit and Call Provider.   insulin  glargine (LANTUS ) 100 UNIT/ML Solostar Pen Inject 45 Units into the skin 2 (two) times daily. May substitute as needed per insurance. 45 units in a.m. and 35 units in p.m.   Insulin  Pen Needle (PEN NEEDLES) 31G X 5 MM MISC 1 each by Does not apply route 3 (three) times daily. May dispense any manufacturer covered by patient's insurance.   Lancets Misc. (ACCU-CHEK SOFTCLIX LANCET DEV) KIT 1 Device by Does not apply route in the morning, at noon, in the evening, and at bedtime.   Lancets MISC 1 Device by Does not apply route 3 (three) times daily before meals. Uses assure platinum glucometer E11.9   losartan  (COZAAR ) 100 MG tablet Take 1 tablet (100 mg total) by mouth daily.   polyethylene glycol (MIRALAX  / GLYCOLAX ) 17 g packet Take 17 g by mouth daily as needed for moderate constipation.   Semaglutide , 2 MG/DOSE, 8 MG/3ML SOPN Inject  2 mg as directed once a week.   No facility-administered encounter medications on file as of 08/31/2024.    Allergies (verified) Patient has no known allergies.   History: Past Medical History:  Diagnosis Date   Acute respiratory failure (HCC)    Cerebral edema (HCC)    Diabetes mellitus without complication (HCC)    diet controlled- no meds   Dysphagia as late effect of cerebrovascular disease    Fall at home    Hemiparesis affecting left side as late effect of stroke (HCC)    Hyperlipidemia    Hypertension    Stroke (HCC) 2016   Tuberculosis    11 yrs ago per pt.- treated with meds per pt.   Past Surgical History:  Procedure Laterality Date   CHOLECYSTECTOMY N/A 11/30/2022   Procedure: LAPAROSCOPIC  CHOLECYSTECTOMY WITH INTRAOPERATIVE CHOLANGIOGRAM;  Surgeon: Vanderbilt Ned, MD;  Location: [facility] OR;  Service: General;  Laterality: N/A;   COLONOSCOPY  10/06/2016   ESOPHAGOGASTRODUODENOSCOPY N/A 08/12/2015   Procedure: ESOPHAGOGASTRODUODENOSCOPY (EGD);  Surgeon: Gwendlyn ONEIDA Buddy, MD;  Location: [facility] ENDOSCOPY;  Service: Endoscopy;  Laterality: N/A;   feeding tube removed     GASTROSTOMY TUBE PLACEMENT  05-19-15   mechanical thrombectomy angioplasty stenet placement     SKIN GRAFT  1980's   ankle   UPPER GASTROINTESTINAL ENDOSCOPY     Family History  Problem Relation Age of Onset   Alcohol abuse Sister    Alcohol abuse Brother    Alcohol abuse Father    Colon cancer Neg Hx    Colon polyps Neg Hx    Rectal cancer Neg Hx    Stomach cancer Neg Hx    Esophageal cancer Neg Hx    Social History   Socioeconomic History   Marital status: Single    Spouse name: Not on file   Number of children: Not on file   Years of education: Not on file   Highest education level: Not on file  Occupational History   Not on file  Tobacco Use   Smoking status: Former    Current packs/day: 0.00    Types: Cigarettes    Quit date: 02/07/2020    Years since quitting: 4.5   Smokeless tobacco: Never  Vaping Use   Vaping status: Never Used  Substance and Sexual Activity   Alcohol use: No    Alcohol/week: 0.0 standard drinks of alcohol   Drug use: No   Sexual activity: Yes  Other Topics Concern   Not on file  Social History Narrative   Diet?  No salt      Do you drink/eat things with caffeine? none      Do you live in a house, apartment, assisted living, condo, trailer, etc.? Apartment      Is it one or more stories? One, flat      How many persons live in your home?      Do you have any pets in your home? (please list)  no      Current or past profession:      Do you exercise?      no                                Type & how often?      Do you have a living will? no      Do you have a  DNR form?    no  If not, do you want to discuss one?      Do you have signed POA/HPOA for forms?       Social Drivers of Corporate investment banker Strain: Low Risk  (01/11/2018)   Overall Financial Resource Strain (CARDIA)    Difficulty of Paying Living Expenses: Not hard at all  Food Insecurity: No Food Insecurity (01/11/2018)   Hunger Vital Sign    Worried About Running Out of Food in the Last Year: Never true    Ran Out of Food in the Last Year: Never true  Transportation Needs: No Transportation Needs (01/11/2018)   PRAPARE - Administrator, Civil Service (Medical): No    Lack of Transportation (Non-Medical): No  Physical Activity: Inactive (01/11/2018)   Exercise Vital Sign    Days of Exercise per Week: 0 days    Minutes of Exercise per Session: 0 min  Stress: No Stress Concern Present (01/11/2018)   [employer] of Occupational Health - Occupational Stress Questionnaire    Feeling of Stress : Only a little  Social Connections: Moderately Isolated (01/11/2018)   Social Connection and Isolation Panel    Frequency of Communication with Friends and Family: More than three times a week    Frequency of Social Gatherings with Friends and Family: More than three times a week    Attends Religious Services: Never    Database administrator or Organizations: No    Attends Engineer, structural: Never    Marital Status: Never married    Tobacco Counseling Counseling given: Not Answered   Clinical Intake:  Pre-visit preparation completed: No  Pain : 0-10 Pain Score: 9  (sometimes) Pain Type: Chronic pain Pain Location: Hand Pain Orientation: Left Pain Radiating Towards: No Pain Descriptors / Indicators: Sharp Pain Onset: More than a month ago Pain Frequency: Intermittent Pain Relieving Factors: Tylenol  Effect of Pain on Daily Activities: No  Pain Relieving Factors: Tylenol   BMI - recorded: 36.98 Nutritional Status: BMI > 30   Obese Nutritional Risks: None Diabetes: Yes CBG done?: No Did pt. bring in CBG monitor from home?: No  How often do you need to have someone help you when you read instructions, pamphlets, or other written materials from your doctor or pharmacy?: 1 - Never What is the last grade level you completed in school?: 12 grade  Interpreter Needed?: No      Activities of Daily Living    08/31/2024    3:02 PM  In your present state of health, do you have any difficulty performing the following activities:  Hearing? 0  Vision? 0  Difficulty concentrating or making decisions? 0  Walking or climbing stairs? 1  Comment walks with a cane  Dressing or bathing? 0  Doing errands, shopping? 1  Comment Needs assist with drving  Preparing Food and eating ? N  Using the Toilet? N  In the past six months, have you accidently leaked urine? Y  Do you have problems with loss of bowel control? N  Managing your Medications? N  Managing your Finances? N  Housekeeping or managing your Housekeeping? Y  Comment sister assist    Patient Care Team: Kabeer Hoagland C, NP as PCP - General (Family Medicine) Center, [facility]  Indicate any recent Medical Services you may have received from other than [facility] providers in the past year (date may be approximate).     Assessment:   This is a  routine wellness examination for [name] [name].  Hearing/Vision screen Hearing Screening - Comments:: No hearing issues   Goals Addressed             This Visit's Progress    Exercise 3x per week (30 min per time)   Not on track    Paitient would like to start silver sneakers      Increase water  intake   On track    Starting 12/10/16, I will attempt to increase my water  intake from 2-16 oz bottles of water  per day to 5-16 oz. Bottles of water .        Depression Screen    08/31/2024    2:28 PM 07/26/2024    1:54 PM 01/24/2024    2:18 PM  09/27/2023    2:51 PM 07/22/2023   12:50 PM 01/19/2023    2:05 PM 01/19/2023    1:43 PM  PHQ 2/9 Scores  PHQ - 2 Score 0 0 2 0 0 0 0  PHQ- 9 Score   2        Fall Risk    08/31/2024    2:28 PM 07/26/2024    1:53 PM 01/24/2024    2:17 PM 09/27/2023    2:50 PM 08/22/2023    3:08 PM  Fall Risk   Falls in the past year? 0 0 0 0 0  Number falls in past yr: 0 0 0 0 0  Injury with Fall? 0 0 0  0  Risk for fall due to : Impaired balance/gait No Fall Risks No Fall Risks  No Fall Risks  Follow up Falls evaluation completed Falls evaluation completed Falls evaluation completed  Falls evaluation completed    MEDICARE RISK AT HOME: Medicare Risk at Home Any stairs in or around the home?: Yes If so, are there any without handrails?: No Home free of loose throw rugs in walkways, pet beds, electrical cords, etc?: Yes Adequate lighting in your home to reduce risk of falls?: Yes Life alert?: Yes Use of a cane, walker or w/c?: Yes (cane) Grab bars in the bathroom?: Yes Shower chair or bench in shower?: Yes Elevated toilet seat or a handicapped toilet?: Yes  TIMED UP AND GO:  Was the test performed?  Yes  Length of time to ambulate 10 feet: 15 sec Gait slow and steady without use of assistive device    Cognitive Function:    01/11/2018   11:33 AM 12/10/2016   11:30 AM  MMSE - Mini Mental State Exam  Not completed:  Unable to complete  Orientation to time 4   Orientation to Place 5   Registration 3   Attention/ Calculation 0   Recall 3   Language- name 2 objects 2   Language- repeat 1   Language- follow 3 step command 3   Language- read & follow direction 1   Write a sentence 1   Copy design 0   Total score 23         08/31/2024    2:30 PM 07/17/2021    3:23 PM 01/17/2020    1:27 PM  6CIT Screen  What Year? 0 points 0 points 0 points  What month? 0 points 0 points 0 points  What time? 0 points 0 points 0 points  Count back from 20 4 points 4 points 2 points  Months in reverse  4 points 4 points 4 points  Repeat phrase 8 points 10 points 10 points  Total Score 16 points 18 points 16 points  Immunizations Immunization History  Administered Date(s) Administered   Fluad Quad(high Dose 65+) 12/17/2021   Fluad Trivalent(High Dose 65+) 07/22/2023   INFLUENZA, HIGH DOSE SEASONAL PF 07/26/2024   Influenza,inj,Quad PF,6+ Mos 08/10/2015, 09/22/2016, 07/29/2017, 11/27/2018, 09/28/2019, 12/08/2020   Pneumococcal Conjugate-13 01/16/2019   Pneumococcal Polysaccharide-23 12/10/2016   Td 01/16/2019   Tdap 01/16/2019   Zoster Recombinant(Shingrix) 12/17/2021, 07/26/2024    TDAP status: Up to date  Flu Vaccine status: Up to date  Pneumococcal vaccine status: Due, Education has been provided regarding the importance of this vaccine. Advised may receive this vaccine at local pharmacy or Health Dept. Aware to provide a copy of the vaccination record if obtained from local pharmacy or Health Dept. Verbalized acceptance and understanding.  Covid-19 vaccine status: Information provided on how to obtain vaccines.   Qualifies for Shingles Vaccine? Yes   Zostavax completed No   Shingrix Completed?: Yes  Screening Tests  Health Maintenance  Topic Date Due   Pneumococcal Vaccine: 50+ Years (3 of 3 - PCV20 or PCV21) 01/23/2025 (Originally 01/16/2024)   Colonoscopy  01/23/2025 (Originally 03/24/2023)   COVID-19 Vaccine (1 - 2025-26 season) 08/11/2028 (Originally 07/09/2024)   FOOT EXAM  01/23/2025   HEMOGLOBIN A1C  01/23/2025   OPHTHALMOLOGY EXAM  01/24/2025   Diabetic kidney evaluation - eGFR measurement  07/26/2025   Diabetic kidney evaluation - Urine ACR  07/26/2025   Medicare Annual Wellness (AWV)  08/31/2025   DTaP/Tdap/Td (3 - Td or Tdap) 01/15/2029   Influenza Vaccine  Completed   Hepatitis C Screening  Completed   HIV Screening  Completed   Zoster Vaccines- Shingrix  Completed   Hepatitis B Vaccines 19-59 Average Risk  Aged Out   Meningococcal B Vaccine  Aged Out     Health Maintenance  There are no preventive care reminders to display for this patient.   Colorectal cancer screening: Type of screening: Colonoscopy. Completed 03/23/2018. Repeat every 5 years  Lung Cancer Screening: (Low Dose CT Chest recommended if Age 70-80 years, 20 pack-year currently smoking OR have quit w/in 15years.) does qualify.   Lung Cancer Screening Referral: yes  Additional Screening:  Hepatitis C Screening: does qualify; Completed yes   Vision Screening: Recommended annual ophthalmology exams for early detection of glaucoma and other disorders of the eye. Is the patient up to date with their annual eye exam?  Yes  Who is the provider or what is the name of the office in which the patient attends annual eye exams? Dr.Groat  If pt is not established with a provider, would they like to be referred to a provider to establish care? No .   Dental Screening: Recommended annual dental exams for proper oral hygiene  Diabetic Foot Exam: Diabetic Foot Exam: Completed 01/24/2024  Community Resource Referral / Chronic Care Management: CRR required this visit?  No   CCM required this visit?  No     Plan:     I have personally reviewed and noted the following in the patient's chart:   Medical and social history Use of alcohol, tobacco or illicit drugs  Current medications and supplements including opioid prescriptions. Patient is not currently taking opioid prescriptions. Functional ability and status Nutritional status Physical activity Advanced directives List of other physicians Hospitalizations, surgeries, and ER visits in previous 12 months Vitals Screenings to include cognitive, depression, and falls Referrals and appointments  In addition, I have reviewed and discussed with patient certain preventive protocols, quality metrics, and best practice recommendations. A written personalized care plan  for preventive services as well as general preventive health  recommendations were provided to patient.     Roxan JAYSON Plough, NP   08/31/2024   After Visit Summary: (In Person-Printed) AVS printed and given to the patient  Nurse Notes: Advised to get COVID-19 vaccine at the Pharmacy

## 2024-08-31 NOTE — Patient Instructions (Signed)
 [name] [name] , Thank you for taking time to come for your Medicare Wellness Visit. I appreciate your ongoing commitment to your health goals. Please review the following plan we discussed and let me know if I can assist you in the future.   Screening recommendations/referrals: Colonoscopy : Ordered  Recommended yearly ophthalmology/optometry visit for glaucoma screening and checkup Recommended yearly dental visit for hygiene and checkup  Vaccinations: Influenza vaccine due annually in September/October Pneumococcal vaccine : Up to date  Tdap vaccine : Up to date  Shingles vaccine : Up to date     Advanced directives: No   Conditions/risks identified:  advanced age (>65men, >24 women);hypertension;male gender;obesity (BMI >30kg/m2);smoking/ tobacco exposure;diabetes mellitus;family history of premature cardiovascular disease  Next appointment: 1 year   Preventive Care 65 Years and Older, Male Preventive care refers to lifestyle choices and visits with your health care provider that can promote health and wellness. What does preventive care include? A yearly physical exam. This is also called an annual well check. Dental exams once or twice a year. Routine eye exams. Ask your health care provider how often you should have your eyes checked. Personal lifestyle choices, including: Daily care of your teeth and gums. Regular physical activity. Eating a healthy diet. Avoiding tobacco and drug use. Limiting alcohol use. Practicing safe sex. Taking low doses of aspirin  every day. Taking vitamin and mineral supplements as recommended by your health care provider. What happens during an annual well check? The services and screenings done by your health care provider during your annual well check will depend on your age, overall health, lifestyle risk factors, and family history of disease. Counseling  Your health care provider may ask you questions about your: Alcohol use. Tobacco use. Drug  use. Emotional well-being. Home and relationship well-being. Sexual activity. Eating habits. History of falls. Memory and ability to understand (cognition). Work and work Astronomer. Screening  You may have the following tests or measurements: Height, weight, and BMI. Blood pressure. Lipid and cholesterol levels. These may be checked every 5 years, or more frequently if you are over 50 years old. Skin check. Lung cancer screening. You may have this screening every year starting at age 655 if you have a 30-pack-year history of smoking and currently smoke or have quit within the past 15 years. Fecal occult blood test (FOBT) of the stool. You may have this test every year starting at age 655. Flexible sigmoidoscopy or colonoscopy. You may have a sigmoidoscopy every 5 years or a colonoscopy every 10 years starting at age 65. Prostate cancer screening. Recommendations will vary depending on your family history and other risks. Hepatitis C blood test. Hepatitis B blood test. Sexually transmitted disease (STD) testing. Diabetes screening. This is done by checking your blood sugar (glucose) after you have not eaten for a while (fasting). You may have this done every 1-3 years. Abdominal aortic aneurysm (AAA) screening. You may need this if you are a current or former smoker. Osteoporosis. You may be screened starting at age 655 if you are at high risk. Talk with your health care provider about your test results, treatment options, and if necessary, the need for more tests. Vaccines  Your health care provider may recommend certain vaccines, such as: Influenza vaccine. This is recommended every year. Tetanus, diphtheria, and acellular pertussis (Tdap, Td) vaccine. You may need a Td booster every 10 years. Zoster vaccine. You may need this after age 53. Pneumococcal 13-valent conjugate (PCV13) vaccine. One dose is recommended after age  65. Pneumococcal polysaccharide (PPSV23) vaccine. One dose is  recommended after age 65. Talk to your health care provider about which screenings and vaccines you need and how often you need them. This information is not intended to replace advice given to you by your health care provider. Make sure you discuss any questions you have with your health care provider. Document Released: 11/21/2015 Document Revised: 07/14/2016 Document Reviewed: 08/26/2015 Elsevier Interactive Patient Education  2017 [employer].  Fall Prevention in the Home Falls can cause injuries. They can happen to people of all ages. There are many things you can do to make your home safe and to help prevent falls. What can I do on the outside of my home? Regularly fix the edges of walkways and driveways and fix any cracks. Remove anything that might make you trip as you walk through a door, such as a raised step or threshold. Trim any bushes or trees on the path to your home. Use bright outdoor lighting. Clear any walking paths of anything that might make someone trip, such as rocks or tools. Regularly check to see if handrails are loose or broken. Make sure that both sides of any steps have handrails. Any raised decks and porches should have guardrails on the edges. Have any leaves, snow, or ice cleared regularly. Use sand or salt on walking paths during winter. Clean up any spills in your garage right away. This includes oil or grease spills. What can I do in the bathroom? Use night lights. Install grab bars by the toilet and in the tub and shower. Do not use towel bars as grab bars. Use non-skid mats or decals in the tub or shower. If you need to sit down in the shower, use a plastic, non-slip stool. Keep the floor dry. Clean up any water  that spills on the floor as soon as it happens. Remove soap buildup in the tub or shower regularly. Attach bath mats securely with double-sided non-slip rug tape. Do not have throw rugs and other things on the floor that can make you  trip. What can I do in the bedroom? Use night lights. Make sure that you have a light by your bed that is easy to reach. Do not use any sheets or blankets that are too big for your bed. They should not hang down onto the floor. Have a firm chair that has side arms. You can use this for support while you get dressed. Do not have throw rugs and other things on the floor that can make you trip. What can I do in the kitchen? Clean up any spills right away. Avoid walking on wet floors. Keep items that you use a lot in easy-to-reach places. If you need to reach something above you, use a strong step stool that has a grab bar. Keep electrical cords out of the way. Do not use floor polish or wax that makes floors slippery. If you must use wax, use non-skid floor wax. Do not have throw rugs and other things on the floor that can make you trip. What can I do with my stairs? Do not leave any items on the stairs. Make sure that there are handrails on both sides of the stairs and use them. Fix handrails that are broken or loose. Make sure that handrails are as long as the stairways. Check any carpeting to make sure that it is firmly attached to the stairs. Fix any carpet that is loose or worn. Avoid having throw rugs at  the top or bottom of the stairs. If you do have throw rugs, attach them to the floor with carpet tape. Make sure that you have a light switch at the top of the stairs and the bottom of the stairs. If you do not have them, ask someone to add them for you. What else can I do to help prevent falls? Wear shoes that: Do not have high heels. Have rubber bottoms. Are comfortable and fit you well. Are closed at the toe. Do not wear sandals. If you use a stepladder: Make sure that it is fully opened. Do not climb a closed stepladder. Make sure that both sides of the stepladder are locked into place. Ask someone to hold it for you, if possible. Clearly mark and make sure that you can  see: Any grab bars or handrails. First and last steps. Where the edge of each step is. Use tools that help you move around (mobility aids) if they are needed. These include: Canes. Walkers. Scooters. Crutches. Turn on the lights when you go into a dark area. Replace any light bulbs as soon as they burn out. Set up your furniture so you have a clear path. Avoid moving your furniture around. If any of your floors are uneven, fix them. If there are any pets around you, be aware of where they are. Review your medicines with your doctor. Some medicines can make you feel dizzy. This can increase your chance of falling. Ask your doctor what other things that you can do to help prevent falls. This information is not intended to replace advice given to you by your health care provider. Make sure you discuss any questions you have with your health care provider. Document Released: 08/21/2009 Document Revised: 04/01/2016 Document Reviewed: 11/29/2014 Elsevier Interactive Patient Education  2017 [employer].

## 2024-09-04 ENCOUNTER — Ambulatory Visit: Admitting: Podiatry

## 2024-09-19 ENCOUNTER — Ambulatory Visit (INDEPENDENT_AMBULATORY_CARE_PROVIDER_SITE_OTHER): Admitting: Podiatry

## 2024-09-19 ENCOUNTER — Encounter: Payer: Self-pay | Admitting: Podiatry

## 2024-09-19 DIAGNOSIS — E1149 Type 2 diabetes mellitus with other diabetic neurological complication: Secondary | ICD-10-CM | POA: Diagnosis not present

## 2024-09-19 DIAGNOSIS — B351 Tinea unguium: Secondary | ICD-10-CM | POA: Diagnosis not present

## 2024-09-19 DIAGNOSIS — M79675 Pain in left toe(s): Secondary | ICD-10-CM | POA: Diagnosis not present

## 2024-09-19 DIAGNOSIS — M79674 Pain in right toe(s): Secondary | ICD-10-CM | POA: Diagnosis not present

## 2024-09-19 NOTE — Progress Notes (Signed)
 This patient returns to my office for at risk foot care.  This patient requires this care by a professional since this patient will be at risk due to having coagulation defect, DM, CKD < CVA and dementia.This patient is unable to cut nails himself since the patient cannot reach his nails.These nails are painful walking and wearing shoes.  This patient presents for at risk foot care today.  General Appearance  Alert, conversant and in no acute stress.  Vascular  Dorsalis pedis and posterior tibial  pulses are  not palpable  bilaterally.  Capillary return is within normal limits  bilaterally. Temperature is within normal limits  bilaterally.  Neurologic  deferred due to CVA.  Nails Thick disfigured discolored nails with subungual debris  from hallux to fifth toes bilaterally. No evidence of bacterial infection or drainage bilaterally.  Orthopedic  No limitations of motion  feet .  No crepitus or effusions noted.  No bony pathology or digital deformities noted.  Skin  normotropic skin with no porokeratosis noted bilaterally.  No signs of infections or ulcers noted.     Onychomycosis  Pain in right toes  Pain in left toes  Consent was obtained for treatment procedures.   Mechanical debridement of nails 1-5  bilaterally performed with a nail nipper.  Filed with dremel without incident.    Return office visit   3 months                   Told patient to return for periodic foot care and evaluation due to potential at risk complications.   Helane Gunther DPM

## 2024-09-24 ENCOUNTER — Other Ambulatory Visit: Payer: Self-pay | Admitting: Family

## 2024-09-24 DIAGNOSIS — E119 Type 2 diabetes mellitus without complications: Secondary | ICD-10-CM

## 2024-09-24 DIAGNOSIS — I10 Essential (primary) hypertension: Secondary | ICD-10-CM

## 2024-09-28 ENCOUNTER — Other Ambulatory Visit: Payer: Self-pay

## 2024-09-28 MED ORDER — AMLODIPINE BESYLATE 10 MG PO TABS: 10.0000 mg | ORAL_TABLET | Freq: Every day | ORAL | 3 refills | Status: AC

## 2024-11-29 ENCOUNTER — Other Ambulatory Visit: Payer: Self-pay | Admitting: Family

## 2024-12-07 ENCOUNTER — Encounter: Payer: Self-pay | Admitting: Gastroenterology

## 2024-12-20 ENCOUNTER — Ambulatory Visit: Admitting: Podiatry

## 2025-01-23 ENCOUNTER — Ambulatory Visit: Payer: Self-pay | Admitting: Family

## 2025-09-06 ENCOUNTER — Ambulatory Visit: Payer: Self-pay | Admitting: Family
